# Patient Record
Sex: Female | Born: 1990 | Race: Asian | Hispanic: No | Marital: Single | Smoking: Never smoker
Health system: Southern US, Community
[De-identification: ages and names within clinical notes are randomized; demographics above are authoritative.]

## PROBLEM LIST (undated history)

## (undated) ENCOUNTER — Inpatient Hospital Stay (HOSPITAL_COMMUNITY): Payer: Self-pay

## (undated) DIAGNOSIS — O139 Gestational [pregnancy-induced] hypertension without significant proteinuria, unspecified trimester: Secondary | ICD-10-CM

## (undated) DIAGNOSIS — I499 Cardiac arrhythmia, unspecified: Secondary | ICD-10-CM

## (undated) DIAGNOSIS — R519 Headache, unspecified: Secondary | ICD-10-CM

## (undated) DIAGNOSIS — D649 Anemia, unspecified: Secondary | ICD-10-CM

## (undated) DIAGNOSIS — J45909 Unspecified asthma, uncomplicated: Secondary | ICD-10-CM

## (undated) DIAGNOSIS — Z452 Encounter for adjustment and management of vascular access device: Secondary | ICD-10-CM

## (undated) DIAGNOSIS — T451X5A Adverse effect of antineoplastic and immunosuppressive drugs, initial encounter: Secondary | ICD-10-CM

## (undated) DIAGNOSIS — N12 Tubulo-interstitial nephritis, not specified as acute or chronic: Secondary | ICD-10-CM

## (undated) DIAGNOSIS — O24419 Gestational diabetes mellitus in pregnancy, unspecified control: Secondary | ICD-10-CM

## (undated) DIAGNOSIS — N390 Urinary tract infection, site not specified: Secondary | ICD-10-CM

## (undated) DIAGNOSIS — O019 Hydatidiform mole, unspecified: Secondary | ICD-10-CM

## (undated) DIAGNOSIS — N856 Intrauterine synechiae: Secondary | ICD-10-CM

## (undated) DIAGNOSIS — D701 Agranulocytosis secondary to cancer chemotherapy: Secondary | ICD-10-CM

## (undated) HISTORY — DX: Encounter for adjustment and management of vascular access device: Z45.2

## (undated) HISTORY — DX: Adverse effect of antineoplastic and immunosuppressive drugs, initial encounter: T45.1X5A

## (undated) HISTORY — DX: Intrauterine synechiae: N85.6

## (undated) HISTORY — DX: Cardiac arrhythmia, unspecified: I49.9

## (undated) HISTORY — DX: Adverse effect of antineoplastic and immunosuppressive drugs, initial encounter: D70.1

## (undated) HISTORY — DX: Hydatidiform mole, unspecified: O01.9

## (undated) HISTORY — DX: Tubulo-interstitial nephritis, not specified as acute or chronic: N12

---

## 2012-05-29 ENCOUNTER — Encounter (HOSPITAL_COMMUNITY): Payer: Self-pay

## 2012-05-29 ENCOUNTER — Emergency Department (INDEPENDENT_AMBULATORY_CARE_PROVIDER_SITE_OTHER)
Admission: EM | Admit: 2012-05-29 | Discharge: 2012-05-29 | Disposition: A | Discharge status: Home / Self Care | Payer: Self-pay | Source: Home / Self Care | Attending: Family Medicine | Admitting: Family Medicine

## 2012-05-29 DIAGNOSIS — J029 Acute pharyngitis, unspecified: Secondary | ICD-10-CM

## 2012-05-29 DIAGNOSIS — R1031 Right lower quadrant pain: Secondary | ICD-10-CM

## 2012-05-29 LAB — POCT URINALYSIS DIP (DEVICE)
Bilirubin Urine: NEGATIVE
Hgb urine dipstick: NEGATIVE
Ketones, ur: NEGATIVE mg/dL
Leukocytes, UA: NEGATIVE
Specific Gravity, Urine: 1.01 (ref 1.005–1.030)
pH: 7 (ref 5.0–8.0)

## 2012-05-29 LAB — WET PREP, GENITAL
Trich, Wet Prep: NONE SEEN
Yeast Wet Prep HPF POC: NONE SEEN

## 2012-05-29 MED ORDER — NAPROXEN 500 MG PO TABS: 500.0000 mg | ORAL_TABLET | Freq: Two times a day (BID) | ORAL | Status: DC

## 2012-05-29 MED ORDER — ACETAMINOPHEN-CODEINE #3 300-30 MG PO TABS: 1.0000 | ORAL_TABLET | Freq: Four times a day (QID) | ORAL | Status: DC | PRN

## 2012-05-29 MED ORDER — AMOXICILLIN 500 MG PO CAPS: 500.0000 mg | ORAL_CAPSULE | Freq: Three times a day (TID) | ORAL | Status: DC

## 2012-05-29 MED ORDER — PROMETHAZINE HCL 25 MG PO TABS: 25.0000 mg | ORAL_TABLET | Freq: Four times a day (QID) | ORAL | Status: DC | PRN

## 2012-05-29 NOTE — ED Notes (Signed)
Reportedly has been having lower abdominal pain x 1 week , worst RLQ, HA

## 2012-05-31 NOTE — ED Provider Notes (Signed)
History     CSN: [id number]  Arrival date & time 05/29/12  1241   First MD Initiated Contact with Patient 05/29/12 1407      Chief Complaint  Patient presents with  . Abdominal Pain    (Consider location/radiation/quality/duration/timing/severity/associated sxs/prior treatment) HPI Comments: 21 year old female from [place] here with vague complains including headache, sore throat, pain with swallowing, nausea and abdominal pain for one week. Patient reports subjective fever. States she has taken Advil without relief. No rashes. Reports her abdominal pain is mostly on the lower abdomen mostly on the right side. Denies vaginal discharge. Last bowel movement this morning soft brown. Denies constipation or diarrhea. She is sexually active and does not use protection. She is on Depo-Provera for birth control. Denies prior history of sexually transmitted diseases.   History reviewed. No pertinent past medical history.  History reviewed. No pertinent past surgical history.  History reviewed. No pertinent family history.  History  Substance Use Topics  . Smoking status: Not on file  . Smokeless tobacco: Not on file  . Alcohol Use:     OB History    Grav Para Term Preterm Abortions TAB SAB Ect Mult Living                  Review of Systems  Constitutional: Positive for fever. Negative for chills, appetite change and fatigue.  HENT: Positive for sore throat and trouble swallowing. Negative for ear pain, congestion and rhinorrhea.   Respiratory: Negative for cough, shortness of breath and wheezing.   Gastrointestinal: Positive for nausea and abdominal pain. Negative for vomiting.  Genitourinary: Negative for dysuria, vaginal bleeding and vaginal discharge.  Musculoskeletal: Negative for myalgias and arthralgias.  Skin: Negative for rash.  Neurological: Positive for headaches. Negative for dizziness.  All other systems reviewed and are negative.    Allergies  Review of patient's  allergies indicates no known allergies.  Home Medications   Current Outpatient Rx  Name  Route  Sig  Dispense  Refill  . ACETAMINOPHEN-CODEINE #3 300-30 MG PO TABS   Oral   Take 1 tablet by mouth every 6 (six) hours as needed for pain.   20 tablet   0   . AMOXICILLIN 500 MG PO CAPS   Oral   Take 1 capsule (500 mg total) by mouth 3 (three) times daily.   21 capsule   0   . NAPROXEN 500 MG PO TABS   Oral   Take 1 tablet (500 mg total) by mouth 2 (two) times daily with a meal.   20 tablet   0   . PROMETHAZINE HCL 25 MG PO TABS   Oral   Take 1 tablet (25 mg total) by mouth every 6 (six) hours as needed for nausea.   15 tablet   0     BP 120/92  Pulse 83  Temp 98.8 F (37.1 C) (Oral)  Resp 16  SpO2 100%  Physical Exam  Vitals reviewed. Constitutional: She appears well-developed and well-nourished. No distress.  HENT:  Head: Normocephalic and atraumatic.       Nose normal. Significant pharyngeal erythema, white exudates. No uvula deviation. No trismus. TM's with increased vascular markings and some dullness bilaterally no swelling or bulging   Eyes: Conjunctivae normal and EOM are normal. Pupils are equal, round, and reactive to light. Right eye exhibits no discharge. Left eye exhibits no discharge. No scleral icterus.  Cardiovascular: Normal rate, regular rhythm and normal heart sounds.   No murmur heard.  Pulmonary/Chest: Effort normal and breath sounds normal. No respiratory distress. She has no wheezes. She has no rales. She exhibits no tenderness.  Abdominal: Soft. Bowel sounds are normal. She exhibits no distension and no mass. There is no tenderness. There is no rebound and no guarding.  Genitourinary: Uterus normal. There is no rash or lesion on the right labia. There is no rash or lesion on the left labia. Uterus is not enlarged and not tender. Cervix exhibits no motion tenderness and no discharge. Right adnexum displays no mass, no tenderness and no fullness.  Left adnexum displays no mass, no tenderness and no fullness. No erythema around the vagina. Vaginal discharge found.  Lymphadenopathy:       Right: No inguinal adenopathy present.       Left: No inguinal adenopathy present.  Skin: She is not diaphoretic.    ED Course  Procedures (including critical care time)  Labs Reviewed  WET PREP, GENITAL - Abnormal; Notable for the following:    Clue Cells Wet Prep HPF POC FEW (*)     WBC, Wet Prep HPF POC FEW (*)     All other components within normal limits  POCT PREGNANCY, URINE  POCT URINALYSIS DIP (DEVICE)  GC/CHLAMYDIA PROBE AMP   No results found.   1. Pharyngitis   2. Right lower quadrant pain       MDM  Negative pregnancy test. Pharyngitis treated with amoxicillin.  Reassuring abdominal exam with no findings suggestive of acute abdomen, clinically well. Abdominal pain treated with naproxen and Tylenol No. 3. Prescribed Phenergan for nausea to take as as needed. GC and Chlamydia pending at the time of discharge. Supportive care and red flags that should prompt her return to medical attention discussed with patient and provided in writing.        Sharin Grave, MD 05/31/12 [phone]

## 2012-06-01 NOTE — ED Notes (Addendum)
AS of 06-01-2012, GC report negative Chlamydia report negative Wet prep shows  Clue few WBC few   no further treatment required

## 2012-09-30 ENCOUNTER — Other Ambulatory Visit (HOSPITAL_COMMUNITY)
Admission: RE | Admit: 2012-09-30 | Discharge: 2012-09-30 | Disposition: A | Discharge status: Home / Self Care | Payer: Medicaid Other | Source: Ambulatory Visit | Attending: Family Medicine | Admitting: Family Medicine

## 2012-09-30 ENCOUNTER — Encounter (HOSPITAL_COMMUNITY): Payer: Self-pay | Admitting: *Deleted

## 2012-09-30 ENCOUNTER — Emergency Department (INDEPENDENT_AMBULATORY_CARE_PROVIDER_SITE_OTHER)
Admission: EM | Admit: 2012-09-30 | Discharge: 2012-09-30 | Disposition: A | Discharge status: Home / Self Care | Payer: Medicaid Other | Source: Home / Self Care

## 2012-09-30 DIAGNOSIS — R300 Dysuria: Secondary | ICD-10-CM

## 2012-09-30 DIAGNOSIS — Z113 Encounter for screening for infections with a predominantly sexual mode of transmission: Secondary | ICD-10-CM | POA: Insufficient documentation

## 2012-09-30 DIAGNOSIS — N760 Acute vaginitis: Secondary | ICD-10-CM | POA: Insufficient documentation

## 2012-09-30 DIAGNOSIS — R1110 Vomiting, unspecified: Secondary | ICD-10-CM

## 2012-09-30 DIAGNOSIS — N72 Inflammatory disease of cervix uteri: Secondary | ICD-10-CM

## 2012-09-30 DIAGNOSIS — R102 Pelvic and perineal pain: Secondary | ICD-10-CM

## 2012-09-30 DIAGNOSIS — N949 Unspecified condition associated with female genital organs and menstrual cycle: Secondary | ICD-10-CM

## 2012-09-30 LAB — CBC WITH DIFFERENTIAL/PLATELET
Basophils Absolute: 0 10*3/uL (ref 0.0–0.1)
Eosinophils Absolute: 0.1 10*3/uL (ref 0.0–0.7)
Eosinophils Relative: 1 % (ref 0–5)
Lymphocytes Relative: 45 % (ref 12–46)
MCH: 31.7 pg (ref 26.0–34.0)
MCV: 92.1 fL (ref 78.0–100.0)
Platelets: 206 10*3/uL (ref 150–400)
RDW: 11.8 % (ref 11.5–15.5)
WBC: 10.2 10*3/uL (ref 4.0–10.5)

## 2012-09-30 LAB — POCT URINALYSIS DIP (DEVICE)
Glucose, UA: NEGATIVE mg/dL
Nitrite: NEGATIVE
Urobilinogen, UA: 0.2 mg/dL (ref 0.0–1.0)

## 2012-09-30 LAB — POCT PREGNANCY, URINE: Preg Test, Ur: NEGATIVE

## 2012-09-30 MED ORDER — ONDANSETRON 4 MG PO TBDP
ORAL_TABLET | ORAL | Status: AC
  Filled 2012-09-30: qty 1

## 2012-09-30 MED ORDER — CEFTRIAXONE SODIUM 250 MG IJ SOLR
250.0000 mg | Freq: Once | INTRAMUSCULAR | Status: AC
  Administered 2012-09-30: 250 mg via INTRAMUSCULAR

## 2012-09-30 MED ORDER — ONDANSETRON HCL 4 MG PO TABS: 4.0000 mg | ORAL_TABLET | Freq: Four times a day (QID) | ORAL | Status: DC

## 2012-09-30 MED ORDER — LIDOCAINE HCL (PF) 1 % IJ SOLN
INTRAMUSCULAR | Status: AC
  Filled 2012-09-30: qty 5

## 2012-09-30 MED ORDER — ONDANSETRON 4 MG PO TBDP
4.0000 mg | ORAL_TABLET | Freq: Once | ORAL | Status: AC
  Administered 2012-09-30: 4 mg via ORAL

## 2012-09-30 MED ORDER — AZITHROMYCIN 250 MG PO TABS
ORAL_TABLET | ORAL | Status: AC
  Filled 2012-09-30: qty 4

## 2012-09-30 MED ORDER — HYDROCODONE-ACETAMINOPHEN 5-325 MG PO TABS: ORAL_TABLET | ORAL | Status: DC

## 2012-09-30 MED ORDER — AZITHROMYCIN 250 MG PO TABS
500.0000 mg | ORAL_TABLET | Freq: Every day | ORAL | Status: DC
  Administered 2012-09-30: 500 mg via ORAL

## 2012-09-30 MED ORDER — CEFTRIAXONE SODIUM 250 MG IJ SOLR
INTRAMUSCULAR | Status: AC
  Filled 2012-09-30: qty 250

## 2012-09-30 NOTE — ED Provider Notes (Signed)
Medical screening examination/treatment/procedure(s) were performed by resident physician or non-physician practitioner and as supervising physician I was immediately available for consultation/collaboration.   KINDL,JAMES DOUGLAS MD.   James D Kindl, MD 09/30/12 2042 

## 2012-09-30 NOTE — ED Notes (Addendum)
C/o low abdominal pain onset 2 weeks ago. Pain is worse at night and can't sleep.  No fever.  Vomited every day for 1 week.  Vomited 3-4 x yesterday and today.  No diarrhea. No vaginal bleeding.  LMP 08/27/12.  Had neg. pregnacy test yesterday and today.  C/o burning and frequency of urination in small amounts.  No vaginal discharge.  C/o breast tenderness for 3 days.

## 2012-09-30 NOTE — ED Provider Notes (Signed)
History     CSN: [id number]  Arrival date & time 09/30/12  1429   First MD Initiated Contact with Patient 09/30/12 1641      Chief Complaint  Patient presents with  . Abdominal Pain    (Consider location/radiation/quality/duration/timing/severity/associated sxs/prior treatment) HPI Comments: 22 year old female with low, or pelvic pain for nearly a month. He tends to be worse after 7 PM and at nighttime. It is intermittent and sometimes crampy. It is associated with vomiting, headache and low back ache. She has vomited 4 times today. In nearly everyday for the past several days. In addition complains of dysuria and urinary frequency. She states that when she voids her urine stops halfway. Denies vaginal discharge or fever.   History reviewed. No pertinent past medical history.  History reviewed. No pertinent past surgical history.  History reviewed. No pertinent family history.  History  Substance Use Topics  . Smoking status: Never Smoker   . Smokeless tobacco: Not on file  . Alcohol Use: No    OB History   Grav Para Term Preterm Abortions TAB SAB Ect Mult Living                  Review of Systems  Constitutional: Positive for activity change and appetite change. Negative for fever.  HENT: Negative.   Respiratory: Negative.   Cardiovascular: Negative.   Gastrointestinal: Positive for nausea, vomiting and abdominal pain. Negative for diarrhea, constipation, blood in stool and abdominal distention.  Genitourinary: Positive for dysuria, urgency, frequency, decreased urine volume and pelvic pain. Negative for hematuria, vaginal bleeding, vaginal discharge and vaginal pain.  Musculoskeletal: Negative.   Skin: Negative.   Neurological: Negative.   Psychiatric/Behavioral: Negative.     Allergies  Review of patient's allergies indicates no known allergies.  Home Medications   Current Outpatient Rx  Name  Route  Sig  Dispense  Refill  . acetaminophen-codeine (TYLENOL  #3) 300-30 MG per tablet   Oral   Take 1 tablet by mouth every 6 (six) hours as needed for pain.   20 tablet   0   . amoxicillin (AMOXIL) 500 MG capsule   Oral   Take 1 capsule (500 mg total) by mouth 3 (three) times daily.   21 capsule   0   . naproxen (NAPROSYN) 500 MG tablet   Oral   Take 1 tablet (500 mg total) by mouth 2 (two) times daily with a meal.   20 tablet   0   . ondansetron (ZOFRAN) 4 MG tablet   Oral   Take 1 tablet (4 mg total) by mouth every 6 (six) hours.   15 tablet   0   . promethazine (PHENERGAN) 25 MG tablet   Oral   Take 1 tablet (25 mg total) by mouth every 6 (six) hours as needed for nausea.   15 tablet   0     BP 123/82  Pulse 81  Temp(Src) 98.6 F (37 C) (Oral)  Resp 16  SpO2 100%  LMP 08/27/2012  Physical Exam  Nursing note and vitals reviewed. Constitutional: She is oriented to person, place, and time. She appears well-developed and well-nourished. No distress.  HENT:  Head: Normocephalic and atraumatic.  Mouth/Throat: No oropharyngeal exudate.  Eyes: EOM are normal. Pupils are equal, round, and reactive to light.  Neck: Normal range of motion. Neck supple.  Cardiovascular: Normal rate and normal heart sounds.   Pulmonary/Chest: Effort normal and breath sounds normal. No respiratory distress. She has no wheezes.  She has no rales.  Abdominal: Soft. There is no tenderness. There is no rebound and no guarding.  Genitourinary:  Tenderness across pelvis Normal external female genitalia. The cervix is just left of the midline. The ectocervix has mild erythema and there is a brown thick discharge in the os and vaginal vault. The  volume is small. Q-tip  angulation of cervix produces moderate to severe pain.  swabs were obtained for testing  Bimanual: Moderate pain with CMT, right and left adnexal tenderness. No masses were palpated.   Musculoskeletal: Normal range of motion.  Neurological: She is alert and oriented to person, place, and  time. No cranial nerve deficit.  Skin: Skin is warm and dry.  Psychiatric: She has a normal mood and affect.    ED Course  Procedures (including critical care time)  Labs Reviewed  CBC WITH DIFFERENTIAL - Abnormal; Notable for the following:    Lymphs Abs 4.6 (*)    All other components within normal limits  POCT URINALYSIS DIP (DEVICE) - Abnormal; Notable for the following:    Hgb urine dipstick SMALL (*)    All other components within normal limits  URINE CULTURE  POCT PREGNANCY, URINE  CERVICOVAGINAL ANCILLARY ONLY   No results found.  Results for orders placed during the hospital encounter of 09/30/12  CBC WITH DIFFERENTIAL      Result Value Range   WBC 10.2  4.0 - 10.5 K/uL   RBC 4.17  3.87 - 5.11 MIL/uL   Hemoglobin 13.2  12.0 - 15.0 g/dL   HCT 45.4  09.8 - 11.9 %   MCV 92.1  78.0 - 100.0 fL   MCH 31.7  26.0 - 34.0 pg   MCHC 34.4  30.0 - 36.0 g/dL   RDW 14.7  82.9 - 56.2 %   Platelets 206  150 - 400 K/uL   Neutrophils Relative 46  43 - 77 %   Neutro Abs 4.7  1.7 - 7.7 K/uL   Lymphocytes Relative 45  12 - 46 %   Lymphs Abs 4.6 (*) 0.7 - 4.0 K/uL   Monocytes Relative 8  3 - 12 %   Monocytes Absolute 0.8  0.1 - 1.0 K/uL   Eosinophils Relative 1  0 - 5 %   Eosinophils Absolute 0.1  0.0 - 0.7 K/uL   Basophils Relative 0  0 - 1 %   Basophils Absolute 0.0  0.0 - 0.1 K/uL  POCT URINALYSIS DIP (DEVICE)      Result Value Range   Glucose, UA NEGATIVE  NEGATIVE mg/dL   Bilirubin Urine NEGATIVE  NEGATIVE   Ketones, ur NEGATIVE  NEGATIVE mg/dL   Specific Gravity, Urine 1.015  1.005 - 1.030   Hgb urine dipstick SMALL (*) NEGATIVE   pH 7.5  5.0 - 8.0   Protein, ur NEGATIVE  NEGATIVE mg/dL   Urobilinogen, UA 0.2  0.0 - 1.0 mg/dL   Nitrite NEGATIVE  NEGATIVE   Leukocytes, UA NEGATIVE  NEGATIVE  POCT PREGNANCY, URINE      Result Value Range   Preg Test, Ur NEGATIVE  NEGATIVE      1. Pelvic pain in female   2. Cervicitis   3. Dysuria   4. Vomiting       MDM   The patient does not have an acute abdomen or signs of peritoneal irritation. The primary finding is that of cervicitis/CMT. In office with administer. Zofran 4 mg by mouth, Rocephin 250 mg IM and 500 mg of azithromycin.  This    dose is based on her small body habitus and weight. Norco 5 mg one half tablet every 4 hours when necessary pain #10   Prescription for Zofran 4 mg by mouth every 6 hours when necessary nausea and vomiting. Encouraged to drink clear liquids. Send urine for culture The lab work urinalysis and pregnancy test were all within normal limits or negative. for any worsening, new symptoms problems, fever, unable to hold  medications down or worsening... return.   Hayden Rasmussen, NP 09/30/12 1834  Hayden Rasmussen, NP 09/30/12 [id number]

## 2012-10-01 LAB — URINE CULTURE

## 2012-10-23 ENCOUNTER — Emergency Department (HOSPITAL_COMMUNITY): Payer: Medicaid Other

## 2012-10-23 ENCOUNTER — Encounter (HOSPITAL_COMMUNITY): Payer: Self-pay | Admitting: *Deleted

## 2012-10-23 ENCOUNTER — Emergency Department (HOSPITAL_COMMUNITY)
Admission: EM | Admit: 2012-10-23 | Discharge: 2012-10-24 | Disposition: A | Discharge status: Home / Self Care | Payer: Medicaid Other | Attending: Emergency Medicine | Admitting: Emergency Medicine

## 2012-10-23 ENCOUNTER — Ambulatory Visit: Payer: Self-pay | Admitting: Family Medicine

## 2012-10-23 DIAGNOSIS — R42 Dizziness and giddiness: Secondary | ICD-10-CM | POA: Insufficient documentation

## 2012-10-23 DIAGNOSIS — R509 Fever, unspecified: Secondary | ICD-10-CM | POA: Insufficient documentation

## 2012-10-23 DIAGNOSIS — R1110 Vomiting, unspecified: Secondary | ICD-10-CM | POA: Insufficient documentation

## 2012-10-23 DIAGNOSIS — Z3202 Encounter for pregnancy test, result negative: Secondary | ICD-10-CM | POA: Insufficient documentation

## 2012-10-23 DIAGNOSIS — A419 Sepsis, unspecified organism: Secondary | ICD-10-CM

## 2012-10-23 DIAGNOSIS — N390 Urinary tract infection, site not specified: Secondary | ICD-10-CM | POA: Insufficient documentation

## 2012-10-23 LAB — CBC WITH DIFFERENTIAL/PLATELET
Basophils Absolute: 0 10*3/uL (ref 0.0–0.1)
Eosinophils Relative: 0 % (ref 0–5)
Lymphocytes Relative: 18 % (ref 12–46)
Lymphs Abs: 2.9 10*3/uL (ref 0.7–4.0)
MCV: 89.8 fL (ref 78.0–100.0)
Neutro Abs: 11.3 10*3/uL — ABNORMAL HIGH (ref 1.7–7.7)
Neutrophils Relative %: 69 % (ref 43–77)
Platelets: 204 10*3/uL (ref 150–400)
RBC: 4.03 MIL/uL (ref 3.87–5.11)
RDW: 11.8 % (ref 11.5–15.5)
WBC: 16.3 10*3/uL — ABNORMAL HIGH (ref 4.0–10.5)

## 2012-10-23 LAB — COMPREHENSIVE METABOLIC PANEL
ALT: 16 U/L (ref 0–35)
AST: 20 U/L (ref 0–37)
Alkaline Phosphatase: 58 U/L (ref 39–117)
CO2: 26 mEq/L (ref 19–32)
Calcium: 9.6 mg/dL (ref 8.4–10.5)
GFR calc Af Amer: >90 mL/min (ref >90)
GFR calc non Af Amer: >90 mL/min (ref >90)
Glucose, Bld: 101 mg/dL — ABNORMAL HIGH (ref 70–99)
Potassium: 3.3 mEq/L — ABNORMAL LOW (ref 3.5–5.1)
Sodium: 134 mEq/L — ABNORMAL LOW (ref 135–145)

## 2012-10-23 LAB — URINALYSIS, MICROSCOPIC ONLY
Bilirubin Urine: NEGATIVE
Glucose, UA: NEGATIVE mg/dL
Hgb urine dipstick: NEGATIVE
Protein, ur: NEGATIVE mg/dL
Specific Gravity, Urine: 1.004 — ABNORMAL LOW (ref 1.005–1.030)

## 2012-10-23 LAB — OCCULT BLOOD, POC DEVICE: Fecal Occult Bld: NEGATIVE

## 2012-10-23 LAB — POCT PREGNANCY, URINE: Preg Test, Ur: NEGATIVE

## 2012-10-23 MED ORDER — IOHEXOL 300 MG/ML  SOLN
80.0000 mL | Freq: Once | INTRAMUSCULAR | Status: AC | PRN
  Administered 2012-10-23: 80 mL via INTRAVENOUS

## 2012-10-23 MED ORDER — IOHEXOL 300 MG/ML  SOLN
50.0000 mL | Freq: Once | INTRAMUSCULAR | Status: AC | PRN
  Administered 2012-10-23: 50 mL via ORAL

## 2012-10-23 NOTE — ED Notes (Signed)
Pt with LLQ pain, emesis, headache and dizziness x 2 weeks.  C/o burning with urination and L flank pain.  Denies vag discharge or changes in bowel habits.  LMP 08/27/12, pt on depo shot.

## 2012-10-23 NOTE — ED Notes (Signed)
Pt transported to CT ?

## 2012-10-23 NOTE — ED Provider Notes (Signed)
History     CSN: [id number]  Arrival date & time 10/23/12  1646   First MD Initiated Contact with Patient 10/23/12 1913      Chief Complaint  Patient presents with  . Abdominal Pain    (Consider location/radiation/quality/duration/timing/severity/associated sxs/prior treatment) HPI Comments: Patient is a 22 y/o F presenting with LLQ x 2 weeks. Patient described pain as a sharp, shooting, throbbing pain that is intermittent, lasting a couple of minutes, but has increased in intensity over the past week, without radiation. Patient reported that she has been using Ibuprofen with minimal relief. Patient reported that is it hard to sleep because she keeps getting up in the middle of the night due to the pain. Patient reported one episode of emesis this morning, mainly of food contents. Associated symptoms are subjective fever and dizziness. Patient denied change to appetite, dysphagia, shortness of breathe, difficulty breathing, chest pain, diarrhea, melena, hematochezia, vaginal bleeding, vaginal discharge, changes to bowel habits, urinary symptoms, leg pain.  Patient was recently seen in the ED for cervicitis and treated while in ED.   Patient reported LMP being 08/27/2012 - stated that she is currently on Depo-Provera shots, last shot given on 08/19/2012. Patient reported being sexually active, but uses protection with partner.   The history is provided by the patient. No language interpreter was used.    History reviewed. No pertinent past medical history.  History reviewed. No pertinent past surgical history.  No family history on file.  History  Substance Use Topics  . Smoking status: Never Smoker   . Smokeless tobacco: Not on file  . Alcohol Use: No    OB History   Grav Para Term Preterm Abortions TAB SAB Ect Mult Living                  Review of Systems  Constitutional: Positive for fever. Negative for chills.  HENT: Negative for ear pain, sore throat, trouble swallowing  and neck pain.   Respiratory: Negative for chest tightness and shortness of breath.   Cardiovascular: Negative for chest pain.  Gastrointestinal: Positive for vomiting and abdominal pain. Negative for nausea and diarrhea.       LLQ  Genitourinary: Negative for dysuria, hematuria, flank pain, decreased urine volume, vaginal bleeding, vaginal discharge, difficulty urinating, vaginal pain and pelvic pain.  Neurological: Positive for dizziness. Negative for numbness and headaches.  All other systems reviewed and are negative.    Allergies  Ondansetron and Promethazine  Home Medications   Current Outpatient Rx  Name  Route  Sig  Dispense  Refill  . ibuprofen (ADVIL,MOTRIN) 200 MG tablet   Oral   Take 200 mg by mouth every 6 (six) hours as needed for pain.           BP 118/82  Pulse 128  Temp(Src) 97.7 F (36.5 C) (Oral)  Resp 18  SpO2 99%  LMP 08/27/2012  Physical Exam  Nursing note and vitals reviewed. Constitutional: She is oriented to person, place, and time. She appears well-developed and well-nourished. No distress.  HENT:  Head: Normocephalic and atraumatic.  Eyes: Conjunctivae and EOM are normal. Pupils are equal, round, and reactive to light. Right eye exhibits no discharge. Left eye exhibits no discharge.  Neck: Normal range of motion. Neck supple. No tracheal deviation present.  Cardiovascular: Normal rate, regular rhythm, normal heart sounds and intact distal pulses.  Exam reveals no friction rub.   No murmur heard. Pulmonary/Chest: Effort normal and breath sounds normal. No respiratory  distress. She has no wheezes. She has no rales.  Abdominal: Soft. Bowel sounds are normal. She exhibits no distension. There is tenderness. There is no rebound and no guarding.  Tenderness upon deep palpation to LLQ  Genitourinary:  Rectal Exam: No masses, lesions, sores noted to external region. No masses or lesions noted to rectum. Good sphincter tone. No pain during exam.  Negative blood.   Performed with chaperone  Lymphadenopathy:    She has no cervical adenopathy.  Neurological: She is alert and oriented to person, place, and time. No cranial nerve deficit. She exhibits normal muscle tone. Coordination normal.  Skin: Skin is warm and dry. No rash noted. She is not diaphoretic. No erythema.  Psychiatric: She has a normal mood and affect. Her behavior is normal. Thought content normal.    ED Course  Procedures (including critical care time)  Labs Reviewed  CBC WITH DIFFERENTIAL - Abnormal; Notable for the following:    WBC 16.3 (*)    Neutro Abs 11.3 (*)    Monocytes Relative 13 (*)    Monocytes Absolute 2.1 (*)    All other components within normal limits  COMPREHENSIVE METABOLIC PANEL - Abnormal; Notable for the following:    Sodium 134 (*)    Potassium 3.3 (*)    Glucose, Bld 101 (*)    BUN 5 (*)    All other components within normal limits  URINALYSIS, MICROSCOPIC ONLY - Abnormal; Notable for the following:    Color, Urine STRAW (*)    Specific Gravity, Urine 1.004 (*)    Leukocytes, UA TRACE (*)    All other components within normal limits  LIPASE, BLOOD  POCT PREGNANCY, URINE  OCCULT BLOOD, POC DEVICE   No results found.   No diagnosis found.    MDM  Patient is a 22 y/o F presenting to the ED with LLQ tenderness x 2 weeks that has gotten progressively worse - described as a sharp, throbbing sensation - without radiation. Patient reported Ibuprofen gives minimal relief. Associated symptoms are subjective fever and dizziness. Denied dysuria, urinary symptoms, vaginal pain, vaginal bleeding, vaginal discharge, pelvic pain, diarrhea, melena, hematochezia, chills, chest pain, shortness of breathe.  Patient was treated for cervicitis on 09/30/2012 - GC/Chlamydia probe negative from 09/30/2012.  I personally evaluated and examined the patient Discussed case with Dr. Effie Shy  Patient refused pain medication when offered.   Fecal Occult  blood negative. UA trace of leukocytes - negative nitrites, ketones, protein, bacteria, Hgb, bilirubin CMP low sodium (134) and potassium (3.3) Lipase within normal limits (23) CBC elevated WBC (16.3) Urine pregnancy negative CT Abdomen and Pelvic with contrast ordered  Patient afebrile, normotensive, non-tachycardic, alert, and oriented. Patient aseptic, non-toxic appearing. No sign of acute abdomen or peritoneal irritation. Negative urine pregnancy - r/o ectopic. Discussed case with Kyung Bacca, PA-C. Transfer of care to [employer], PA-C.           Raymon Mutton, PA-C 10/23/12 2334  Raymon Mutton, PA-C 10/24/12 1610

## 2012-10-23 NOTE — ED Notes (Addendum)
Pt reports having lower left abd pain x 2wks. Pt has been seen in urgent care and they could not find a cause for her pain but she states it has gotten unbearable. Pt rates pain 8/10. Pt also states she has some nausea, vomiting and headache with the pain. Pt also tachycardiac.

## 2012-10-24 MED ORDER — ACETAMINOPHEN 500 MG PO TABS
1000.0000 mg | ORAL_TABLET | Freq: Once | ORAL | Status: AC
  Administered 2012-10-24: 1000 mg via ORAL
  Filled 2012-10-24: qty 2

## 2012-10-24 MED ORDER — SODIUM CHLORIDE 0.9 % IV BOLUS (SEPSIS)
1000.0000 mL | Freq: Once | INTRAVENOUS | Status: AC
  Administered 2012-10-24: 1000 mL via INTRAVENOUS

## 2012-10-24 MED ORDER — METOCLOPRAMIDE HCL 10 MG PO TABS: 10.0000 mg | ORAL_TABLET | Freq: Four times a day (QID) | ORAL | Status: DC

## 2012-10-24 MED ORDER — CEPHALEXIN 500 MG PO CAPS: 1000.0000 mg | ORAL_CAPSULE | Freq: Two times a day (BID) | ORAL | Status: DC

## 2012-10-24 NOTE — ED Notes (Signed)
Pt ambulated to bathroom 

## 2012-10-24 NOTE — ED Notes (Signed)
Pt to be discharged AMA.

## 2012-10-24 NOTE — ED Provider Notes (Signed)
Medical screening examination/treatment/procedure(s) were performed by non-physician practitioner and as supervising physician I was immediately available for consultation/collaboration.  Flint Melter, MD 10/24/12 1910

## 2012-10-24 NOTE — ED Notes (Signed)
Pt wants to go home, notified ED PA Katie, she is going to speak with pt.

## 2012-10-26 NOTE — ED Provider Notes (Signed)
Pt received from [place], PA-C.  Healthy 21yo F presented to ED w/ abd pain, emesis and dysuria.  Urine microscopic nml but CT abd/pelvis consistent w/ L pyelonephritis.  Consulted Dr. Manson Passey, Radiology, and there is no other explanation for her findings.  Results discussed w/ pt.  Plan was to send home w/ abx, but at time of discharge, HR increased to 130.  A fluid bolus initiated.  Temp then increased to 99.9, HR jumped into 150s and RR into upper 30s.  I tried to convince patient to be admitted for what appeared to be urosepsis, but she refused.  Both her and her boyfriend were made aware of possible consequences of leaving, including worsening symptoms and death.  Prescribed keflex and Return precautions discussed.   Shelly Sabina Savaughn Karwowski, PA-C 10/26/12 1031

## 2012-10-28 NOTE — ED Provider Notes (Signed)
Medical screening examination/treatment/procedure(s) were performed by non-physician practitioner and as supervising physician I was immediately available for consultation/collaboration.  Avonlea Sima L Simrah Chatham, MD 10/28/12 1542 

## 2012-10-30 ENCOUNTER — Ambulatory Visit: Payer: Self-pay | Admitting: Medical

## 2012-10-30 ENCOUNTER — Encounter: Payer: Self-pay | Admitting: Medical

## 2012-10-30 VITALS — BP 110/78 | HR 72 | Temp 98.3°F | Resp 16 | Wt 90.0 lb

## 2012-10-30 DIAGNOSIS — N12 Tubulo-interstitial nephritis, not specified as acute or chronic: Secondary | ICD-10-CM

## 2012-10-30 DIAGNOSIS — R110 Nausea: Secondary | ICD-10-CM

## 2012-10-30 LAB — POCT URINALYSIS DIPSTICK
Blood, UA: NEGATIVE
Leukocytes, UA: NEGATIVE
Nitrite, UA: NEGATIVE
Urobilinogen, UA: NEGATIVE
pH, UA: 5

## 2012-10-30 MED ORDER — HYDROCODONE-ACETAMINOPHEN 7.5-500 MG PO TABS: 1.0000 | ORAL_TABLET | Freq: Four times a day (QID) | ORAL | Status: DC | PRN

## 2012-10-30 MED ORDER — CIPROFLOXACIN HCL 500 MG PO TABS: 500.0000 mg | ORAL_TABLET | Freq: Two times a day (BID) | ORAL | Status: DC

## 2012-10-30 NOTE — Progress Notes (Signed)
Subjective:  Shelly Koch is a 22 y.o. female who presents as a new patient for hospital f/u.   She is here with interpreter today.   I see her parents as patients.   She went to the hospital about a week ago for worsening few weeks of left lower abdominal pain, nausea, vomiting.  Was diagnosed with pyelonephritis and was advised to be admitted for possible sepsis but she declined to stay.  She did come home on Keflex.  Overall is feeling better, but still has left lower abdominal pain and nausea.   No prior hx/o pyelo or hospitalization for urinary tract infection.  No other aggravating or relieving factors.  No other c/o.  The following portions of the patient's history were reviewed and updated as appropriate: allergies, current medications, past family history, past medical history, past social history, past surgical history and problem list.  ROS Otherwise as in subjective above  Objective: Physical Exam  Vital signs reviewed  General appearance: alert, no distress, WD/WN Neck: supple, no lymphadenopathy, no thyromegaly, no masses Heart: RRR, normal S1, S2, no murmurs Lungs: CTA bilaterally, no wheezes, rhonchi, or rales Abdomen: +bs, soft, mild to moderate LLQ tenderness, non distended, no masses, no hepatomegaly, no splenomegaly Back: no CVA tenderness Pulses: 2+ radial pulses, 2+ pedal pulses, normal cap refill Ext: no edema   Assessment: Encounter Diagnoses  Name Primary?  . Pyelonephritis Yes  . Nausea alone      Plan: Reviewed hospital report, labs, and she does seem to be doing better clinically.  Urine culture sent.   Finish the keflex but begin Cipro, increase water intake, can use Lortab for pain prn. Can c/t Reglan for nausea/vomiting.     Follow up: pending culture

## 2012-11-01 LAB — URINE CULTURE
Colony Count: NO GROWTH
Organism ID, Bacteria: NO GROWTH

## 2012-11-04 ENCOUNTER — Telehealth: Payer: Self-pay | Admitting: Medical

## 2012-11-04 NOTE — Telephone Encounter (Signed)
LM

## 2012-11-10 ENCOUNTER — Encounter: Payer: Self-pay | Admitting: Family Medicine

## 2012-11-13 ENCOUNTER — Telehealth: Payer: Self-pay | Admitting: Family Medicine

## 2012-11-13 ENCOUNTER — Other Ambulatory Visit: Payer: Self-pay | Admitting: Medical

## 2012-11-13 MED ORDER — CIPROFLOXACIN HCL 500 MG PO TABS: 500.0000 mg | ORAL_TABLET | Freq: Two times a day (BID) | ORAL | Status: DC

## 2012-11-13 NOTE — Telephone Encounter (Signed)
This is your patient.

## 2012-11-13 NOTE — Telephone Encounter (Signed)
I sent cipro again.  If not improved by end of this script, recheck

## 2012-11-13 NOTE — Telephone Encounter (Signed)
Patient's boyfriend (Shelly Koch) stopped by, she would like refill on Cipro. She is some better but not completely  Please call Shelly Koch and let him know if Rx called in  (He is on HIPPA)   [facility]

## 2012-11-16 NOTE — Telephone Encounter (Signed)
Patient is aware that another medication was sent to the pharmacy for her. CLS

## 2013-01-29 ENCOUNTER — Ambulatory Visit (INDEPENDENT_AMBULATORY_CARE_PROVIDER_SITE_OTHER): Payer: Medicaid Other | Admitting: Medical

## 2013-01-29 ENCOUNTER — Encounter: Payer: Self-pay | Admitting: Medical

## 2013-01-29 VITALS — BP 108/78 | HR 80 | Temp 97.4°F | Resp 16 | Wt 92.5 lb

## 2013-01-29 DIAGNOSIS — N911 Secondary amenorrhea: Secondary | ICD-10-CM

## 2013-01-29 DIAGNOSIS — N926 Irregular menstruation, unspecified: Secondary | ICD-10-CM

## 2013-01-29 DIAGNOSIS — N912 Amenorrhea, unspecified: Secondary | ICD-10-CM

## 2013-01-29 LAB — POCT URINALYSIS DIPSTICK
Bilirubin, UA: NEGATIVE
Blood, UA: NEGATIVE
Glucose, UA: NEGATIVE
Ketones, UA: NEGATIVE
Spec Grav, UA: <=1.005
pH, UA: 6

## 2013-01-29 MED ORDER — MEDROXYPROGESTERONE ACETATE 5 MG PO TABS: 5.0000 mg | ORAL_TABLET | Freq: Every day | ORAL | Status: DC

## 2013-01-29 NOTE — Progress Notes (Signed)
Subjective: Here for concerns about her period.   She notes that periods have been normal in the past.  Started Depo Provera May 2013 until November 2013.   Ended up having normal periods through about February, then hasn't had a period since.   Has had several negative home pregnancy tests.  [facility] earlier this year in March, had pap, discussed this same problems, was advised watch and wait approach as it takes time for periods and cycle to restore after coming off Depo.   She is in a monogamous relationship with her boyfriend, and they would like to get pregnant.  She is concerned about not having a cycle, wants to make sure if she does get pregnant that she isn't doing anything to harm the baby.   She has questions about her ability to get pregnant.  She did have CT pelvis in 4/14.  Denies urinary or vaginal symptoms, no fatigue, no weight changes, no breast tenderness.  Does get some pelvic cramping about the 6th of each month, but no mensuration.  Past Medical History  Diagnosis Date  . Pyelonephritis    ROS as in subjective  Objective: Filed Vitals:   01/29/13 1114  BP: 108/78  Pulse: 80  Temp: 97.4 F (36.3 C)  Resp: 16    General appearance: alert, no distress, WD/WN Neck: supple, no lymphadenopathy, no thyromegaly, no masses Abdomen: +bs, soft, non tender, non distended, no masses, no hepatomegaly, no splenomegaly Pulses: 2+ symmetric   Assessment: Secondary Amenorrhea  Plan: Discussed symptoms, concerns, the fact that it can take months after stopping Depo Provera for menstrual cycle and ovulation to return to normal.   Will try short course of Provera, but referral to gynecology for recheck and discussion about her symptoms and concerns for pregnancy.  Of note, abdomen/pelvis CT with normal appearing uterus and ovaries 10/2012.  Follow-up with call back in 2wk, referral to gynecology

## 2013-01-29 NOTE — Patient Instructions (Signed)
Begin Provera tablets, 1 tablet daily for 5 days.  If not bleeding or menstrual period by day 5, then continue the tablets for 5 more days.   Please call back and let me know if this helps start the menstrual cycle.  We will go ahead and refer to obstetrician/gynecologist for recheck/evaluation of irregular periods and desire to get pregnant.

## 2013-02-03 ENCOUNTER — Telehealth: Payer: Self-pay | Admitting: Family Medicine

## 2013-02-03 NOTE — Telephone Encounter (Signed)
Patient is aware of her appointment to see Dr. Juliene Pina on March 08, 2013 @ 1030 am. CLS [phone]

## 2013-02-12 ENCOUNTER — Telehealth: Payer: Self-pay | Admitting: Medical

## 2013-02-12 NOTE — Telephone Encounter (Signed)
At this point, f/u with Dr. Juliene Pina for gynecology appt

## 2013-02-15 NOTE — Telephone Encounter (Signed)
Pt informed to follow up with Gyn

## 2013-05-03 ENCOUNTER — Ambulatory Visit (INDEPENDENT_AMBULATORY_CARE_PROVIDER_SITE_OTHER): Payer: Medicaid Other | Admitting: Family Medicine

## 2013-05-03 ENCOUNTER — Encounter: Payer: Self-pay | Admitting: Family Medicine

## 2013-05-03 VITALS — BP 100/70 | HR 72 | Ht <= 58 in | Wt 93.0 lb

## 2013-05-03 DIAGNOSIS — M545 Low back pain, unspecified: Secondary | ICD-10-CM

## 2013-05-03 DIAGNOSIS — R51 Headache: Secondary | ICD-10-CM

## 2013-05-03 DIAGNOSIS — R519 Headache, unspecified: Secondary | ICD-10-CM

## 2013-05-03 DIAGNOSIS — Z3169 Encounter for other general counseling and advice on procreation: Secondary | ICD-10-CM

## 2013-05-03 LAB — POCT URINALYSIS DIPSTICK
Bilirubin, UA: NEGATIVE
Glucose, UA: NEGATIVE
Ketones, UA: NEGATIVE
Spec Grav, UA: <=1.005
Urobilinogen, UA: NEGATIVE

## 2013-05-03 NOTE — Patient Instructions (Signed)
Muscular headache--use aleve or ibuprofen as needed.  Use warm compresses (or hot shower) and do stretches as shown.  Massage of the neck muscles will also likely help.  There is some evidence of allergies on exam, which may contribute to mild vertigo (dizziness). Use clartin as needed for allergies and sinus congestion, and a nasal saline spray as needed (for the nasal discomfort you described).  Start an over-the-counter prenatal vitamin, and take this daily.  Do a pregnancy test in 2 weeks if you haven't gotten your period when expected.

## 2013-05-03 NOTE — Progress Notes (Signed)
Chief Complaint  Patient presents with  . Headache    since yesterday she has had a headache, still has today. She has not slept because of HA. Also had period on 04/16/13, began spotting (light bleeding) on 04/30/13 accompanied by cramping and lbp-bleeding has stopped and cramps have stopped also, still has back pain.    Headache woke her up from sleep last night.  Location is the back of the entire head, bilaterally, not extending to temples or to the front of the head or sinuses. She took acetaminophen this morning, with only slight improvement.  Currently pain is 4-5/10.  She feels a little dizzy.  No nausea.  Feels like things are spinning some (mild).  Doesn't feel faint/presyncopal.  Denies any runny nose, sniffles, fevers.  She has some sinus congestion, dry nose.  Denies any PND.  Denies ear pain, tinnitus, hearing loss.  10/10 (2 weeks after last period started) she had some cramping and light spotting, as well as some low back pain.  This period was after a 10 day course of progesterone from the GYN.  She had stopped the Depo Provera shots back 05/2012.  She is trying for pregnancy. She had a positive ovulation predictor test on 10/10, and did have unprotected sex.  Past Medical History  Diagnosis Date  . Pyelonephritis    History reviewed. No pertinent past surgical history.  History   Social History  . Marital Status: Single    Spouse Name: N/A    Number of Children: N/A  . Years of Education: N/A   Occupational History  . Not on file.   Social History Main Topics  . Smoking status: Never Smoker   . Smokeless tobacco: Never Used  . Alcohol Use: No  . Drug Use: No  . Sexual Activity: Yes    Partners: Male    Birth Control/ Protection: None     Comment: Stopped Depo in November; trying for pregnancy   Other Topics Concern  . Not on file   Social History Narrative  . No narrative on file   Current outpatient prescriptions:acetaminophen (TYLENOL) 325 MG tablet, Take  650 mg by mouth every 6 (six) hours as needed for pain., Disp: , Rfl: ;  ibuprofen (ADVIL,MOTRIN) 200 MG tablet, Take 200 mg by mouth every 6 (six) hours as needed for pain., Disp: , Rfl: ;  medroxyPROGESTERone (PROVERA) 5 MG tablet, Take 1 tablet (5 mg total) by mouth daily., Disp: 10 tablet, Rfl: 0 metoCLOPramide (REGLAN) 10 MG tablet, Take 1 tablet (10 mg total) by mouth every 6 (six) hours., Disp: 20 tablet, Rfl: 0  Allergies  Allergen Reactions  . Ondansetron Hives and Itching  . Promethazine Hives and Itching    ROS:  See HPI.  No fevers, vomiting, diarrhea.  Abdominal pain resolved.  +congestion, slight vertigo, and posterior headache.  Denies dysuria, urgency, frequency, vaginal discharge, rashes, bruising, or other complaints.  PHYSICAL EXAM: BP 100/70  Pulse 72  Ht 4' 9.5" (1.461 m)  Wt 93 lb (42.185 kg)  BMI 19.76 kg/m2  LMP 04/16/2013 Pleasant female, in no distress.  Some slight language barrier HEENT:  PERRL, EOMI, fundi benign.  Nasal mucosa is moderately edematous with clear-white mucus.  Sinuses nontender. OP clear Mildly tender over temporalis muscles bilaterally. Neck: FROM.  No spine tenderness.  Tender over paraspinous muscles bilaterally, no significant spasm. Heart: regular rate and rhythm without murmur Lungs: clear bilaterally Back: no spine or CVA tenderness Abdomen: soft, nontender  Urine dip: normal  ASSESSMENT/PLAN:  Headache  LBP (low back pain) - Plan: POCT Urinalysis Dipstick  General counseling and advice for procreative management  Muscular headache--use aleve or ibuprofen as needed.  Use warm compresses (or hot shower) and do stretches as shown.  Massage of the neck muscles will also likely help.  There is some evidence of allergies on exam, which may contribute to mild vertigo (dizziness). Use clartin as needed for allergies and sinus congestion, and a nasal saline spray as needed (for the nasal discomfort you described).  Her pelvic  discomfort and spotting on 10/10 was likely related to ovulation (given +ovulatio predictor kit, per pt). She is trying for pregnancy.  Encouraged that she start an over-the-counter prenatal vitamin, take daily.  Encouraged to perform home pregnancy test in 2 weeks if she doesn't get her period.  F/u with her GYN for other concerns related to pregnancy/attempts at pregnancy, as she is clearly under her care now (having just been given another course of progesterone).  Reassured re: her headache and dizziness.  Follow up if symptoms persist, worsen.

## 2013-07-05 ENCOUNTER — Encounter: Payer: Self-pay | Admitting: Medical

## 2013-07-05 ENCOUNTER — Ambulatory Visit (INDEPENDENT_AMBULATORY_CARE_PROVIDER_SITE_OTHER): Payer: Medicaid Other | Admitting: Medical

## 2013-07-05 VITALS — BP 100/70 | HR 80 | Temp 98.0°F | Resp 18 | Wt 89.0 lb

## 2013-07-05 DIAGNOSIS — Z309 Encounter for contraceptive management, unspecified: Secondary | ICD-10-CM

## 2013-07-05 DIAGNOSIS — N926 Irregular menstruation, unspecified: Secondary | ICD-10-CM

## 2013-07-05 LAB — POCT URINE PREGNANCY: Preg Test, Ur: NEGATIVE

## 2013-07-05 MED ORDER — MEDROXYPROGESTERONE ACETATE 5 MG PO TABS: 5.0000 mg | ORAL_TABLET | Freq: Every day | ORAL | Status: DC

## 2013-07-05 NOTE — Progress Notes (Signed)
Subjective: Here for c/o irregular periods.  She notes hx/o irregular periods.  Has been on OCPs briefly in the past that helped control her periods.   She notes currently is menstruating, day 4.  She notes breakthrough bleeding in between periods the last few months.   She had been trying to get pregnant, has been seeing Dr. Tildon Husky at [facility], did 2 rounds of Clomid, had recent labs and ultrasound which were reportedly normal.  However, due to change in insurance, and due to limitations with transportation, can't get back in to see Dr. Tildon Husky at this time.  She wants to try a few months of OCPs, then try again to get pregnant after that.  She is in relationship with current boyfriend of 2 years.  They have no concern for STD, she notes STD testing within last several months, pap normal 09/2012, just saw Dr. Tildon Husky in august for same.    Past Medical History  Diagnosis Date  . Pyelonephritis    ROS as in subjective  Objective:  Gen: wd,wn, nad Otherwise not examined  Assessment:  Encounter Diagnoses  Name Primary?  . Irregular periods Yes  . Contraception management     Plan: I discussed her concerns and she is aware that going back on OCPs temporarily will keep her from getting pregnant.  She understands this and wants to pursue OCPs at this time just to regular her periods.  She is taking a prenatal vitamin.  She wants to use OCPs x 64mo.   She was advised of risks and benefits of medication, proper use, other forms of contraception.  Discussed that this doesn't prevent STDs.  There is no concerns for STD at this time.  I reviewed recent labs in chart.  I called Dr. Fredia Beets office to consult on this.  I will await her call.  Urine pregnant negative today.  Addendum: Dr. Fredia Beets nurse called back and advised she begin Provera 5mg  daily for 10 days starting today 07/05/13, repeat again in 30 days if period doesn't seem to regulate, and can do this regimen for 3 months in a row if needed until  periods seem to resume normal.  Pt was contacted and is aware.  F/u with Dr. Tildon Husky in 64mo, sooner prn.

## 2013-07-12 ENCOUNTER — Ambulatory Visit: Payer: Medicaid Other

## 2013-07-22 NOTE — L&D Delivery Note (Signed)
Delivery Note At 8:34 PM a viable and healthy female was delivered via Vaginal, Spontaneous Delivery (Presentation: Right Occiput Anterior).  APGAR: 8, 9; weight 4 lb 10.4 oz (2110 g).   Placenta status: Intact, Pathology.  Cord: 3 vessels with the following complications: None. ~15 cm diameter opaque area  Cord pH: NA  Anesthesia: Local  Episiotomy: None Lacerations: 2nd degree w/ right labial extension Suture Repair: 3.0 vicryl rapide Est. Blood Loss (mL):  266ml  Mom to postpartum.  Baby to Couplet care / Skin to Skin. Placenta to: Pathology Feeding: Bottle Circ: NA Contraception: Carolann Littler, Thersa Salt 06/05/2014, 9:31 PM

## 2013-08-26 ENCOUNTER — Ambulatory Visit (INDEPENDENT_AMBULATORY_CARE_PROVIDER_SITE_OTHER): Payer: BC Managed Care – PPO | Admitting: Family Medicine

## 2013-08-26 ENCOUNTER — Encounter: Payer: Self-pay | Admitting: Family Medicine

## 2013-08-26 VITALS — BP 110/60 | HR 68 | Ht <= 58 in | Wt 90.0 lb

## 2013-08-26 DIAGNOSIS — K6289 Other specified diseases of anus and rectum: Secondary | ICD-10-CM

## 2013-08-26 DIAGNOSIS — Z23 Encounter for immunization: Secondary | ICD-10-CM

## 2013-08-26 DIAGNOSIS — N898 Other specified noninflammatory disorders of vagina: Secondary | ICD-10-CM

## 2013-08-26 DIAGNOSIS — L293 Anogenital pruritus, unspecified: Secondary | ICD-10-CM

## 2013-08-26 MED ORDER — HYDROCORTISONE ACETATE 25 MG RE SUPP: 25.0000 mg | Freq: Two times a day (BID) | RECTAL | Status: DC | PRN

## 2013-08-26 NOTE — Patient Instructions (Signed)
Vaginal itching is likely related to sensitivity to the pads you are using.  Try using a different brand of pads, unscented.  You can try using Vagisil for irritation (or other cream with 1/2-1% hydrocortisone), just as needed for itching.  Consider using tampons to avoid using pads during your period, to prevent this problem from recurring each month.  Your rectal pain is partly from a fissure.  There might also be an internal hemorrhoid contributing to your discomfort.  Use the suppository twice daily until your symptoms have resolved.  It is important to drink plenty of water, have high fiber diet. If stools become hard, use a stool softener daily--this keeps them soft, and narrower to avoid causing pain.  Anal Fissure, Adult An anal fissure is a small tear or crack in the skin around the anus. Bleeding from a fissure usually stops on its own within a few minutes. However, bleeding will often reoccur with each bowel movement until the crack heals.  CAUSES   Passing large, hard stools.  Frequent diarrheal stools.  Constipation.  Inflammatory bowel disease (Crohn's disease or ulcerative colitis).  Infections.  Anal sex. SYMPTOMS   Small amounts of blood seen on your stools, on toilet paper, or in the toilet after a bowel movement.  Rectal bleeding.  Painful bowel movements.  Itching or irritation around the anus. DIAGNOSIS Your caregiver will examine the anal area. An anal fissure can usually be seen with careful inspection. A rectal exam may be performed and a short tube (anoscope) may be used to examine the anal canal. TREATMENT   You may be instructed to take fiber supplements. These supplements can soften your stool to help make bowel movements easier.  Sitz baths may be recommended to help heal the tear. Do not use soap in the sitz baths.  A medicated cream or ointment may be prescribed to lessen discomfort. HOME CARE INSTRUCTIONS   Maintain a diet high in fruits, whole  grains, and vegetables. Avoid constipating foods like bananas and dairy products.  Take sitz baths as directed by your caregiver.  Drink enough fluids to keep your urine clear or pale yellow.  Only take over-the-counter or prescription medicines for pain, discomfort, or fever as directed by your caregiver. Do not take aspirin as this may increase bleeding.  Do not use ointments containing numbing medications (anesthetics) or hydrocortisone. They could slow healing. SEEK MEDICAL CARE IF:   Your fissure is not completely healed within 3 days.  You have further bleeding.  You have a fever.  You have diarrhea mixed with blood.  You have pain.  Your problem is getting worse rather than better. MAKE SURE YOU:   Understand these instructions.  Will watch your condition.  Will get help right away if you are not doing well or get worse. Document Released: 07/08/2005 Document Revised: 09/30/2011 Document Reviewed: 12/23/2010 [facility] Patient Information 2014 [place], [place].

## 2013-08-26 NOTE — Progress Notes (Signed)
Chief Complaint  Patient presents with  . Vaginal Itching    has been experiencing vaginal itching x 7 days and also thinks she may a blister from using sanitary napkin. Itching started when she started her cycle 08/20/13. Also over the past 12 days each time she has a bowel movement she has been experiencing pain in her rectal area that lasts for a little while after the bowel movement.    Vaginal itching intermittently for a couple of years.  Was only mild when had just light bleeding when on the depo provera injections.  Since periods are heavier, and using pads for 5 days, itching is worse.  Denies vaginal discharge.  Only has itching when she uses the pads, even if she changes them frequently.  She hasn't used tampons (doesn't feel like the bleeding is heavy enough).  She is also having pain and burning in rectal area (internally) when passing a bowel movement, starting 10-12 days ago.  Stool was normal, not hard, no straining.  Didn't see any blood in the stool.  She has continued to have a burning discomfort with each bowel movement. Very painful to pass the stool, but stool is soft, not hard. Last bowel movement was this morning.  Stopped Depo provera injections  05/2012.  Periods have been irregular.  She took 3 shots, and had monthly bleeding while on the shots, but after stopping the shots, didn't get a period for 8 months.  She has had 4-5 periods since stopping the injections, last of which started 1/30.   She is [place], and has an interpreter present with her today.  Past Medical History  Diagnosis Date  . Pyelonephritis    History reviewed. No pertinent past surgical history. History   Social History  . Marital Status: Single    Spouse Name: N/A    Number of Children: N/A  . Years of Education: N/A   Occupational History  . Not on file.   Social History Main Topics  . Smoking status: Never Smoker   . Smokeless tobacco: Never Used  . Alcohol Use: No  . Drug Use: No   . Sexual Activity: Yes    Partners: Male    Birth Control/ Protection: None     Comment: Stopped Depo in November; trying for pregnancy   Other Topics Concern  . Not on file   Social History Narrative  . No narrative on file    Outpatient Encounter Prescriptions as of 08/26/2013  Medication Sig  . ibuprofen (ADVIL,MOTRIN) 200 MG tablet Take 200 mg by mouth every 6 (six) hours as needed for pain.  . [DISCONTINUED] medroxyPROGESTERone (PROVERA) 5 MG tablet Take 1 tablet (5 mg total) by mouth daily.  NOT CURRENTLY TAKING ANY MEDICATIONS:  Allergies  Allergen Reactions  . Ondansetron Hives and Itching  . Promethazine Hives and Itching   ROS:  Denies fever, chills, URI symptoms, nausea, vomiting, constipation, diarrhea, blood in stool. No urinary complaints or vaginal discharge.  No chest pain, cough, shortness of breath  PHYSICAL EXAM: BP 110/60  Pulse 68  Ht 4' 9.5" (1.461 m)  Wt 90 lb (40.824 kg)  BMI 19.13 kg/m2 LMP 1/30 Well developed, pleasant, petite female in no distress.  Understand some english. GU: Area of itching is on labia majora, which appears normal, no erythema or inflammation.  Very mild redness at labia minora. Small amount of brown discharge in vaginal vault, otherwise normal GYN.   Rectal--anteriorly there is an area of inflammation, possibly healed fissure.  It is reportedly tender in this area, but more tender internally. Rectal exam--no mass, painful on exam.  No thrombosed hemorrhoids palpable.  Anoscopy not performed--rectal exam caused discomfort.  ASSESSMENT/PLAN:  Vaginal itching  Need for prophylactic vaccination and inoculation against influenza - Plan: Flu Vaccine QUAD 36+ mos IM  Rectal pain - Plan: hydrocortisone (ANUSOL-HC) 25 MG suppository   Itching is likely related to contact dermatitis/irritation from pads.  Consider use of tampons, switch pads.  Use topical meds prn for discomfort (ie vagisil/hydrocortisone).  Suspect anal fissure.   Can't r/o internal hemorrhoid. Trial of anusol HC suppository.

## 2013-10-12 ENCOUNTER — Encounter (HOSPITAL_COMMUNITY): Payer: Self-pay | Admitting: *Deleted

## 2013-10-12 ENCOUNTER — Inpatient Hospital Stay (HOSPITAL_COMMUNITY)
Admission: AD | Admit: 2013-10-12 | Discharge: 2013-10-13 | Disposition: A | Discharge status: Home / Self Care | Payer: BC Managed Care – PPO | Source: Ambulatory Visit | Attending: Family Medicine | Admitting: Family Medicine

## 2013-10-12 ENCOUNTER — Emergency Department (HOSPITAL_COMMUNITY)
Admission: EM | Admit: 2013-10-12 | Discharge: 2013-10-12 | Disposition: A | Discharge status: Nursing Home (Includes ICF) | Payer: BC Managed Care – PPO | Source: Home / Self Care | Attending: Family Medicine | Admitting: Family Medicine

## 2013-10-12 ENCOUNTER — Inpatient Hospital Stay (HOSPITAL_COMMUNITY): Payer: BC Managed Care – PPO

## 2013-10-12 ENCOUNTER — Encounter (HOSPITAL_COMMUNITY): Payer: Self-pay | Admitting: Emergency Medicine

## 2013-10-12 DIAGNOSIS — R109 Unspecified abdominal pain: Secondary | ICD-10-CM

## 2013-10-12 DIAGNOSIS — M545 Low back pain, unspecified: Secondary | ICD-10-CM | POA: Insufficient documentation

## 2013-10-12 DIAGNOSIS — O26899 Other specified pregnancy related conditions, unspecified trimester: Secondary | ICD-10-CM

## 2013-10-12 DIAGNOSIS — O9989 Other specified diseases and conditions complicating pregnancy, childbirth and the puerperium: Secondary | ICD-10-CM

## 2013-10-12 DIAGNOSIS — O99891 Other specified diseases and conditions complicating pregnancy: Secondary | ICD-10-CM | POA: Insufficient documentation

## 2013-10-12 LAB — POCT URINALYSIS DIP (DEVICE)
Bilirubin Urine: NEGATIVE
Glucose, UA: NEGATIVE mg/dL
HGB URINE DIPSTICK: NEGATIVE
Ketones, ur: NEGATIVE mg/dL
Leukocytes, UA: NEGATIVE
NITRITE: NEGATIVE
PH: 6.5 (ref 5.0–8.0)
Protein, ur: NEGATIVE mg/dL
SPECIFIC GRAVITY, URINE: 1.015 (ref 1.005–1.030)
UROBILINOGEN UA: 0.2 mg/dL (ref 0.0–1.0)

## 2013-10-12 LAB — CBC
HCT: 34.9 % — ABNORMAL LOW (ref 36.0–46.0)
Hemoglobin: 11.5 g/dL — ABNORMAL LOW (ref 12.0–15.0)
MCH: 30.6 pg (ref 26.0–34.0)
MCHC: 33 g/dL (ref 30.0–36.0)
MCV: 92.8 fL (ref 78.0–100.0)
PLATELETS: 165 10*3/uL (ref 150–400)
RBC: 3.76 MIL/uL — ABNORMAL LOW (ref 3.87–5.11)
RDW: 12 % (ref 11.5–15.5)
WBC: 8.9 10*3/uL (ref 4.0–10.5)

## 2013-10-12 LAB — HCG, QUANTITATIVE, PREGNANCY: hCG, Beta Chain, Quant, S: 533 m[IU]/mL — ABNORMAL HIGH (ref <5)

## 2013-10-12 LAB — ABO/RH: ABO/RH(D): AB POS

## 2013-10-12 LAB — POCT PREGNANCY, URINE: Preg Test, Ur: POSITIVE — AB

## 2013-10-12 MED ORDER — SODIUM CHLORIDE 0.9 % IV SOLN
Freq: Once | INTRAVENOUS | Status: AC
  Administered 2013-10-12: 21:00:00 via INTRAVENOUS

## 2013-10-12 MED ORDER — ACETAMINOPHEN 500 MG PO TABS
1000.0000 mg | ORAL_TABLET | Freq: Once | ORAL | Status: AC
  Administered 2013-10-12: 1000 mg via ORAL
  Filled 2013-10-12: qty 2

## 2013-10-12 NOTE — ED Provider Notes (Signed)
Medical screening examination/treatment/procedure(s) were performed by resident physician or non-physician practitioner and as supervising physician I was immediately available for consultation/collaboration.   Pauline Good MD.   Billy Fischer, MD 10/12/13 2129

## 2013-10-12 NOTE — ED Notes (Signed)
Report given to paramedics.

## 2013-10-12 NOTE — ED Provider Notes (Signed)
CSN: [id number]     Arrival date & time 10/12/13  1903 History   None    Chief Complaint  Patient presents with  . Pelvic Pain   (Consider location/radiation/quality/duration/timing/severity/associated sxs/prior Treatment) HPI Comments: LNMP: 09-14-2013 G0P0 OB/GYN: none  Patient is a 23 y.o. female presenting with abdominal pain.  Abdominal Pain Pain location: pelvic. Pain quality: aching, cramping and sharp   Pain radiates to:  Back Pain severity:  Moderate Onset quality:  Gradual Duration:  6 days Timing:  Constant Progression:  Worsening Chronicity:  New Relieved by:  Nothing Associated symptoms: no anorexia, no constipation, no diarrhea, no dysuria, no fever, no flatus, no hematemesis, no hematuria, no melena, no nausea, no shortness of breath, no vaginal bleeding, no vaginal discharge and no vomiting   Risk factors: has not had multiple surgeries and not obese     Past Medical History  Diagnosis Date  . Pyelonephritis    History reviewed. No pertinent past surgical history. History reviewed. No pertinent family history. History  Substance Use Topics  . Smoking status: Never Smoker   . Smokeless tobacco: Never Used  . Alcohol Use: No   OB History   Grav Para Term Preterm Abortions TAB SAB Ect Mult Living                 Review of Systems  Constitutional: Negative for fever.  Respiratory: Negative for shortness of breath.   Gastrointestinal: Positive for abdominal pain. Negative for nausea, vomiting, diarrhea, constipation, melena, anorexia, flatus and hematemesis.  Genitourinary: Negative for dysuria, hematuria, vaginal bleeding and vaginal discharge.  All other systems reviewed and are negative.    Allergies  Ondansetron and Promethazine  Home Medications   Current Outpatient Rx  Name  Route  Sig  Dispense  Refill  . ibuprofen (ADVIL,MOTRIN) 200 MG tablet   Oral   Take 200 mg by mouth every 6 (six) hours as needed for pain.         .  hydrocortisone (ANUSOL-HC) 25 MG suppository   Rectal   Place 1 suppository (25 mg total) rectally 2 (two) times daily as needed for hemorrhoids or itching.   12 suppository   0    BP 129/79  Pulse 82  Temp(Src) 98.9 F (37.2 C) (Oral)  Resp 18  SpO2 100%  LMP 09/14/2013 Physical Exam  Nursing note and vitals reviewed. Constitutional: She is oriented to person, place, and time. She appears well-developed and well-nourished. No distress.  HENT:  Head: Normocephalic and atraumatic.  Eyes: Conjunctivae are normal. No scleral icterus.  Cardiovascular: Normal rate, regular rhythm and normal heart sounds.   Pulmonary/Chest: Effort normal and breath sounds normal. No respiratory distress. She has no wheezes.  Abdominal: Soft. Normal appearance. Bowel sounds are decreased. There is no hepatosplenomegaly. There is tenderness in the right lower quadrant, suprapubic area and left lower quadrant. There is no CVA tenderness.  Musculoskeletal: Normal range of motion.  Neurological: She is alert and oriented to person, place, and time.  Skin: Skin is warm and dry. No rash noted.  Psychiatric: She has a normal mood and affect. Her behavior is normal.    ED Course  Procedures (including critical care time) Labs Review Labs Reviewed  POCT PREGNANCY, URINE - Abnormal; Notable for the following:    Preg Test, Ur POSITIVE (*)    All other components within normal limits  POCT URINALYSIS DIP (DEVICE)   Imaging Review No results found.   MDM   1. Abdominal pain  in pregnancy    Abdominal pain: UA normal. UPT+. Hemodynamically stable. Hx and exam concerning for ectopic pregnancy. Case discussed with Almyra Free Either, PA  (on behalf of Dr. Kennon Rounds) at [facility]. Patient to be transferred to [facility] via [place] for evaluation. IV NS placed prior to transfer.    [place], [place] 10/12/13 2111

## 2013-10-12 NOTE — Discharge Instructions (Signed)
Pregnancy, First Trimester The first trimester is the first 3 months your baby is growing inside you. It is important to follow your doctor's instructions. HOME CARE   Do not smoke.  Do not drink alcohol.  Only take medicine as told by your doctor.  Exercise.  Eat healthy foods. Eat regular, well-balanced meals.  You can have sex (intercourse) if there are no other problems with the pregnancy.  Things that help with morning sickness:  Eat soda crackers before getting up in the morning.  Eat 4 to 5 small meals rather than 3 large meals.  Drink liquids between meals, not during meals.  Go to all appointments as told.  Take all vitamins or supplements as told by your doctor. GET HELP RIGHT AWAY IF:   You develop a fever.  You have a bad smelling fluid that is leaking from your vagina.  There is bleeding from the vagina.  You develop severe belly (abdominal) or back pain.  You throw up (vomit) blood. It may look like coffee grounds.  You lose more than 2 pounds in a week.  You gain 5 pounds or more in a week.  You gain more than 2 pounds in a week and you see puffiness (swelling) in your feet, ankles, or legs.  You have severe dizziness or pass out (faint).  You are around people who have [place] measles, chickenpox, or fifth disease.  You have a headache, watery poop (diarrhea), pain with peeing (urinating), or cannot breath right. Document Released: 12/25/2007 Document Revised: 09/30/2011 Document Reviewed: 12/25/2007 [facility] Patient Information 2014 [place], [place].

## 2013-10-12 NOTE — MAU Provider Note (Signed)
History     CSN: [id number]  Arrival date and time: 10/12/13 2141   First Provider Initiated Contact with Patient 10/12/13 2320      No chief complaint on file.  HPI [name] is a 23 y.o. G1P0 at [redacted]w[redacted]d who presents to MAU today from Urgent Care with complaint of +UPT and lower abdominal pain. The patient states LMP of 09/14/13. She states worsening of lower abdominal pain over the past few weeks. She rates her pain at 7/10 now. She has not taken any pain medication. She denies vaginal bleeding, discharge or fever. She has had occasional dysuria without frequency or urgency. She is also having mild low back pain.   OB History   Grav Para Term Preterm Abortions TAB SAB Ect Mult Living   1               Past Medical History  Diagnosis Date  . Pyelonephritis     Past Surgical History  Procedure Laterality Date  . No past surgeries      No family history on file.  History  Substance Use Topics  . Smoking status: Never Smoker   . Smokeless tobacco: Never Used  . Alcohol Use: No    Allergies:  Allergies  Allergen Reactions  . Ondansetron Hives and Itching  . Promethazine Hives and Itching    No prescriptions prior to admission    Review of Systems  Constitutional: Negative for fever and malaise/fatigue.  Gastrointestinal: Positive for abdominal pain. Negative for nausea, vomiting, diarrhea and constipation.  Genitourinary: Negative for dysuria, urgency, frequency and flank pain.       Neg - vaginal bleeding, discharge  Musculoskeletal: Positive for back pain.   Physical Exam   Blood pressure 121/84, pulse 82, temperature 98.2 F (36.8 C), temperature source Oral, resp. rate 16, last menstrual period 09/14/2013, SpO2 100.00%.  Physical Exam  Constitutional: She is oriented to person, place, and time. She appears well-developed and well-nourished. No distress.  HENT:  Head: Normocephalic and atraumatic.  Cardiovascular: Normal rate.   Respiratory:  Effort normal.  GI: Soft. Bowel sounds are normal. She exhibits no distension and no mass. There is tenderness (mild tenderness to palpation of the lower abdomen bilaterally). There is no rebound and no guarding.  Neurological: She is alert and oriented to person, place, and time.  Skin: Skin is warm and dry. No erythema.  Psychiatric: She has a normal mood and affect.  Pelvic: deferred, performed at Urgent Care  Results for orders placed during the hospital encounter of 10/12/13 (from the past 24 hour(s))  CBC     Status: Abnormal   Collection Time    10/12/13 10:35 PM      Result Value Ref Range   WBC 8.9  4.0 - 10.5 K/uL   RBC 3.76 (*) 3.87 - 5.11 MIL/uL   Hemoglobin 11.5 (*) 12.0 - 15.0 g/dL   HCT 34.9 (*) 36.0 - 46.0 %   MCV 92.8  78.0 - 100.0 fL   MCH 30.6  26.0 - 34.0 pg   MCHC 33.0  30.0 - 36.0 g/dL   RDW 12.0  11.5 - 15.5 %   Platelets 165  150 - 400 K/uL  ABO/RH     Status: None   Collection Time    10/12/13 10:35 PM      Result Value Ref Range   ABO/RH(D) AB POS    HCG, QUANTITATIVE, PREGNANCY     Status: Abnormal   Collection Time  10/12/13 10:35 PM      Result Value Ref Range   hCG, Beta Chain, Quant, S 533 (*) <5 mIU/mL   US Ob Comp Less 14 Wks  10/12/2013   CLINICAL DATA:  Pelvic pain.  EXAM: OBSTETRIC <14 WK ULTRASOUND  TECHNIQUE: Transabdominal ultrasound was performed for evaluation of the gestation as well as the maternal uterus and adnexal regions.  COMPARISON:  No priors.  FINDINGS: Intrauterine gestational sac: None  Yolk sac:  None  Embryo:  None  Cardiac Activity: None  Maternal uterus/adnexae: Uterus and ovaries are unremarkable in appearance. Large volume of free fluid in the cul-de-sac.  IMPRESSION: 1. No IUP identified. 2. Large volume of free fluid in the cul-de-sac. This is of uncertain etiology and significance but may be physiologic in this young female patient.   Electronically Signed   By: Vinnie Langton M.D.   On: 10/12/2013 23:19    MAU  Course  Procedures None  MDM +UPT at Urgent Care UA performed at Urgent Care is WNL CBC, ABO/Rh, quant hCG and [place] today Tylenol given in MAU  Assessment and Plan  A: Abdominal pain in pregnancy, antepartum  P: Discharge home Bleeding/ectopic precautions discussed Recommended Tylenol PRN Patient to return to MAU in 48 hours for follow-up labs Patient may return to MAU as needed sooner if symptoms change or worsen  Farris Has, PA-C  10/13/2013, 12:49 AM

## 2013-10-12 NOTE — MAU Note (Signed)
Arrival via EMS from [facility]. Pt presented there with abd pain, pregnancy test was positive.

## 2013-10-12 NOTE — ED Notes (Signed)
Report called to Alex RN.

## 2013-10-12 NOTE — ED Notes (Signed)
C/o low abdominal pain x 6-7 days.  LMP 2/24.  No vaginal discharge, burning or frequency or urination or hematuria.  No N,V or D.

## 2013-10-12 NOTE — ED Notes (Signed)
[facility] not available.  [facility] called and is coming.

## 2013-10-13 NOTE — Progress Notes (Signed)
Pt given tylenol for abdominal pain

## 2013-10-13 NOTE — MAU Provider Note (Signed)
Attestation of Attending Supervision of Advanced Practitioner (PA/CNM/NP): Evaluation and management procedures were performed by the Advanced Practitioner under my supervision and collaboration.  I have reviewed the Advanced Practitioner's note and chart, and I agree with the management and plan.  Donnamae Jude, MD [facility] Attending 10/13/2013 6:34 AM

## 2013-10-14 ENCOUNTER — Inpatient Hospital Stay (HOSPITAL_COMMUNITY)
Admission: AD | Admit: 2013-10-14 | Discharge: 2013-10-14 | Disposition: A | Discharge status: Home / Self Care | Payer: BC Managed Care – PPO | Source: Ambulatory Visit | Attending: Obstetrics & Gynecology | Admitting: Obstetrics & Gynecology

## 2013-10-14 DIAGNOSIS — O26899 Other specified pregnancy related conditions, unspecified trimester: Secondary | ICD-10-CM

## 2013-10-14 DIAGNOSIS — O9989 Other specified diseases and conditions complicating pregnancy, childbirth and the puerperium: Principal | ICD-10-CM

## 2013-10-14 DIAGNOSIS — O99891 Other specified diseases and conditions complicating pregnancy: Secondary | ICD-10-CM | POA: Insufficient documentation

## 2013-10-14 DIAGNOSIS — R109 Unspecified abdominal pain: Secondary | ICD-10-CM

## 2013-10-14 LAB — HCG, QUANTITATIVE, PREGNANCY: HCG, BETA CHAIN, QUANT, S: 1377 m[IU]/mL — AB (ref <5)

## 2013-10-14 NOTE — MAU Provider Note (Signed)
  History     CSN: [id number]  Arrival date and time: 10/14/13 1806   None     Chief Complaint  Patient presents with  . Follow-up   HPIpt is here for f/u HCG G1P0 [redacted]w[redacted]d pregnant from [place] seen 2 days ago with abd cramping with HCG 533 and ultrasound Not being able to see pregnancy.  Pt is not bleeding today and mild cramping.    Birdie Sons, RN Registered Nurse Signed  MAU Note Service date: 10/14/2013 6:33 PM   Patient to MAU for a follow up BHCG. Patient denies pain or bleeding.    hCG, Beta Chain, Quant, S <5 mIU/mL  1377 (H)    533 (H) CM     Past Medical History  Diagnosis Date  . Pyelonephritis     Past Surgical History  Procedure Laterality Date  . No past surgeries      No family history on file.  History  Substance Use Topics  . Smoking status: Never Smoker   . Smokeless tobacco: Never Used  . Alcohol Use: No    Allergies:  Allergies  Allergen Reactions  . Ondansetron Hives and Itching  . Promethazine Hives and Itching    Prescriptions prior to admission  Medication Sig Dispense Refill  . hydrocortisone (ANUSOL-HC) 25 MG suppository Place 1 suppository (25 mg total) rectally 2 (two) times daily as needed for hemorrhoids or itching.  12 suppository  0    ROS Physical Exam   Blood pressure 123/82, pulse 97, resp. rate 16, last menstrual period 09/14/2013, SpO2 100.00%.  Physical Exam  Vitals reviewed. Constitutional: She is oriented to person, place, and time. She appears well-developed and well-nourished. No distress.  petite  HENT:  Head: Normocephalic.  Eyes: Pupils are equal, round, and reactive to light.  Neck: Normal range of motion. Neck supple.  Cardiovascular: Normal rate.   Respiratory: Effort normal.  GI: Soft.  Musculoskeletal: Normal range of motion.  Neurological: She is alert and oriented to person, place, and time.  Skin: Skin is warm and dry.  Psychiatric: She has a normal mood and affect.    MAU Course    Procedures  Discussed with Dr. Gala Romney Will have pt return in 1 week for [place]- pt informed us will contact her for appt Return sooner for increase in pain or bleeding  Assessment and Plan  abd pain in pregnancy- appropriate rise in HCG F/u one week for repeat US  Adonis Yim 10/14/2013, 7:29 PM

## 2013-10-14 NOTE — MAU Note (Signed)
Patient to MAU for a follow up BHCG. Patient denies pain or bleeding.

## 2013-10-21 ENCOUNTER — Ambulatory Visit (HOSPITAL_COMMUNITY)
Admission: RE | Admit: 2013-10-21 | Discharge: 2013-10-21 | Disposition: A | Discharge status: Home / Self Care | Payer: BC Managed Care – PPO | Source: Ambulatory Visit | Attending: Gynecology | Admitting: Gynecology

## 2013-10-21 DIAGNOSIS — O30049 Twin pregnancy, dichorionic/diamniotic, unspecified trimester: Secondary | ICD-10-CM | POA: Insufficient documentation

## 2013-10-21 DIAGNOSIS — R109 Unspecified abdominal pain: Secondary | ICD-10-CM | POA: Insufficient documentation

## 2013-10-21 DIAGNOSIS — O99891 Other specified diseases and conditions complicating pregnancy: Secondary | ICD-10-CM | POA: Insufficient documentation

## 2013-10-21 DIAGNOSIS — O9989 Other specified diseases and conditions complicating pregnancy, childbirth and the puerperium: Principal | ICD-10-CM

## 2013-10-21 DIAGNOSIS — O26899 Other specified pregnancy related conditions, unspecified trimester: Secondary | ICD-10-CM

## 2013-10-21 DIAGNOSIS — O34599 Maternal care for other abnormalities of gravid uterus, unspecified trimester: Secondary | ICD-10-CM | POA: Insufficient documentation

## 2013-10-21 DIAGNOSIS — N831 Corpus luteum cyst of ovary, unspecified side: Secondary | ICD-10-CM | POA: Insufficient documentation

## 2013-10-21 DIAGNOSIS — O30009 Twin pregnancy, unspecified number of placenta and unspecified number of amniotic sacs, unspecified trimester: Secondary | ICD-10-CM | POA: Insufficient documentation

## 2013-10-30 ENCOUNTER — Emergency Department (HOSPITAL_COMMUNITY)
Admission: EM | Admit: 2013-10-30 | Discharge: 2013-10-30 | Disposition: A | Discharge status: Home / Self Care | Payer: BC Managed Care – PPO | Source: Home / Self Care

## 2013-10-30 ENCOUNTER — Encounter (HOSPITAL_COMMUNITY): Payer: Self-pay | Admitting: Emergency Medicine

## 2013-10-30 DIAGNOSIS — R05 Cough: Secondary | ICD-10-CM

## 2013-10-30 DIAGNOSIS — B349 Viral infection, unspecified: Secondary | ICD-10-CM

## 2013-10-30 DIAGNOSIS — R51 Headache: Secondary | ICD-10-CM

## 2013-10-30 DIAGNOSIS — Z3490 Encounter for supervision of normal pregnancy, unspecified, unspecified trimester: Secondary | ICD-10-CM

## 2013-10-30 DIAGNOSIS — Z331 Pregnant state, incidental: Secondary | ICD-10-CM

## 2013-10-30 DIAGNOSIS — R519 Headache, unspecified: Secondary | ICD-10-CM

## 2013-10-30 DIAGNOSIS — R059 Cough, unspecified: Secondary | ICD-10-CM

## 2013-10-30 DIAGNOSIS — B9789 Other viral agents as the cause of diseases classified elsewhere: Secondary | ICD-10-CM

## 2013-10-30 NOTE — Discharge Instructions (Signed)
Cough, Adult  A cough is a reflex that helps clear your throat and airways. It can help heal the body or may be a reaction to an irritated airway. A cough may only last 2 or 3 weeks (acute) or may last more than 8 weeks (chronic).  CAUSES Acute cough:  Viral or bacterial infections. Chronic cough:  Infections.  Allergies.  Asthma.  Post-nasal drip.  Smoking.  Heartburn or acid reflux.  Some medicines.  Chronic lung problems (COPD).  Cancer. SYMPTOMS   Cough.  Fever.  Chest pain.  Increased breathing rate.  High-pitched whistling sound when breathing (wheezing).  Colored mucus that you cough up (sputum). TREATMENT   A bacterial cough may be treated with antibiotic medicine.  A viral cough must run its course and will not respond to antibiotics.  Your caregiver may recommend other treatments if you have a chronic cough. HOME CARE INSTRUCTIONS   Only take over-the-counter or prescription medicines for pain, discomfort, or fever as directed by your caregiver. Use cough suppressants only as directed by your caregiver.  Use a cold steam vaporizer or humidifier in your bedroom or home to help loosen secretions.  Sleep in a semi-upright position if your cough is worse at night.  Rest as needed.  Stop smoking if you smoke. SEEK IMMEDIATE MEDICAL CARE IF:   You have pus in your sputum.  Your cough starts to worsen.  You cannot control your cough with suppressants and are losing sleep.  You begin coughing up blood.  You have difficulty breathing.  You develop pain which is getting worse or is uncontrolled with medicine.  You have a fever. MAKE SURE YOU:   Understand these instructions.  Will watch your condition.  Will get help right away if you are not doing well or get worse. Document Released: 01/04/2011 Document Revised: 09/30/2011 Document Reviewed: 01/04/2011 [facility] Patient Information 2014 [place].  Fever, Adult A fever is a  temperature of 100.4 F (38 C) or above.  HOME CARE  Take fever medicine as told by your doctor. Do not  take aspirin for fever if you are younger than 25 years of age.  If you are given antibiotic medicine, take it as told. Finish the medicine even if you start to feel better.  Rest.  Drink enough fluids to keep your pee (urine) clear or pale yellow. Do not drink alcohol.  Take a bath or shower with room temperature water. Do not use ice water or alcohol sponge baths.  Wear lightweight, loose clothes. GET HELP RIGHT AWAY IF:   You are short of breath or have trouble breathing.  You are very weak.  You are dizzy or you pass out (faint).  You are very thirsty or are making little or no urine.  You have new pain.  You throw up (vomit) or have watery poop (diarrhea).  You keep throwing up or having watery poop for more than 1 to 2 days.  You have a stiff neck or light bothers your eyes.  You have a skin rash.  You have a fever or problems (symptoms) that last for more than 2 to 3 days.  You have a fever and your problems quickly get worse.  You keep throwing up the fluids you drink.  You do not feel better after 3 days.  You have new problems. MAKE SURE YOU:   Understand these instructions.  Will watch your condition.  Will get help right away if you are not doing well or  get worse. Document Released: 04/16/2008 Document Revised: 09/30/2011 Document Reviewed: 05/09/2011 [facility] Patient Information 2014 [place].May take Tylenol EX strength 1 every 4 hours as needed for headaches and fever. Would check with public heath about safe medications to take for your cough. Natural remedies OTC for cough such as honey are ok.

## 2013-10-30 NOTE — ED Notes (Signed)
Pt c/o cold sxs onset 3 days Reports she is 6 weeks preg Sxs today include: fevers, chest d/c due to cough, abd pain, vomiting Denies diarrhea Alert w/no signs of acute distress.

## 2013-10-30 NOTE — ED Provider Notes (Signed)
CSN: [id number]     Arrival date & time 10/30/13  1846 History   None    Chief Complaint  Patient presents with  . URI   (Consider location/radiation/quality/duration/timing/severity/associated sxs/prior Treatment) HPI Comments: Patient presents [redacted] weeks pregnant. She has mild cough, headache and nausea for 2 days. Subjective fevers. No productive cough.   Patient is a 23 y.o. female presenting with URI. The history is provided by the patient.  URI Presenting symptoms: cough and fever   Presenting symptoms: no fatigue     Past Medical History  Diagnosis Date  . Pyelonephritis    Past Surgical History  Procedure Laterality Date  . No past surgeries     No family history on file. History  Substance Use Topics  . Smoking status: Never Smoker   . Smokeless tobacco: Never Used  . Alcohol Use: No   OB History   Grav Para Term Preterm Abortions TAB SAB Ect Mult Living   1              Review of Systems  Constitutional: Positive for fever. Negative for chills and fatigue.  HENT: Negative.   Eyes: Negative.   Respiratory: Positive for cough. Negative for shortness of breath.   Gastrointestinal: Negative for nausea, abdominal pain, diarrhea, constipation and abdominal distention.  Endocrine: Negative.   Genitourinary: Negative.   Skin: Negative.     Allergies  Ondansetron and Promethazine  Home Medications   Current Outpatient Rx  Name  Route  Sig  Dispense  Refill  . hydrocortisone (ANUSOL-HC) 25 MG suppository   Rectal   Place 1 suppository (25 mg total) rectally 2 (two) times daily as needed for hemorrhoids or itching.   12 suppository   0    BP 122/85  Pulse 87  Temp(Src) 98.8 F (37.1 C) (Oral)  Resp 16  SpO2 100%  LMP 09/14/2013 Physical Exam  Nursing note and vitals reviewed. Constitutional: She is oriented to person, place, and time. She appears well-developed and well-nourished. No distress.  HENT:  Head: Normocephalic and atraumatic.  Right Ear:  External ear normal.  Left Ear: External ear normal.  Mouth/Throat: Oropharynx is clear and moist.  Neck: Normal range of motion. Neck supple.  Cardiovascular: Normal rate and regular rhythm.   Pulmonary/Chest: Effort normal and breath sounds normal. No respiratory distress. She has no wheezes.  Abdominal: Soft. She exhibits no distension. There is no tenderness.  Lymphadenopathy:    She has no cervical adenopathy.  Neurological: She is alert and oriented to person, place, and time.  Skin: Skin is warm and dry. She is not diaphoretic.  Psychiatric: Her behavior is normal.    ED Course  Procedures (including critical care time) Labs Review Labs Reviewed - No data to display Imaging Review No results found.   MDM   1. Viral infection   2. Cough   3. Headache   4. Pregnant    No indication for an abx; specifically in first trimester. Tylenol is ok only if needed for headaches. Honey or home remedies for cough.  She has an appt with public health for pregnancy on Monday-Please check with them re: further medications.     Bjorn Pippin, PA-C 10/30/13 1958

## 2013-11-01 ENCOUNTER — Ambulatory Visit (HOSPITAL_COMMUNITY)
Admission: RE | Admit: 2013-11-01 | Discharge: 2013-11-01 | Disposition: A | Discharge status: Home / Self Care | Payer: BC Managed Care – PPO | Source: Ambulatory Visit | Attending: Gynecology | Admitting: Gynecology

## 2013-11-01 ENCOUNTER — Inpatient Hospital Stay (HOSPITAL_COMMUNITY)
Admission: AD | Admit: 2013-11-01 | Discharge: 2013-11-01 | Disposition: A | Discharge status: Home / Self Care | Payer: BC Managed Care – PPO | Source: Ambulatory Visit | Attending: Obstetrics & Gynecology | Admitting: Obstetrics & Gynecology

## 2013-11-01 ENCOUNTER — Other Ambulatory Visit (HOSPITAL_COMMUNITY): Payer: Self-pay | Admitting: Gynecology

## 2013-11-01 ENCOUNTER — Encounter (HOSPITAL_COMMUNITY): Payer: Self-pay | Admitting: *Deleted

## 2013-11-01 DIAGNOSIS — O30049 Twin pregnancy, dichorionic/diamniotic, unspecified trimester: Secondary | ICD-10-CM

## 2013-11-01 DIAGNOSIS — Z3689 Encounter for other specified antenatal screening: Secondary | ICD-10-CM | POA: Insufficient documentation

## 2013-11-01 DIAGNOSIS — O30009 Twin pregnancy, unspecified number of placenta and unspecified number of amniotic sacs, unspecified trimester: Secondary | ICD-10-CM | POA: Insufficient documentation

## 2013-11-01 DIAGNOSIS — Z3201 Encounter for pregnancy test, result positive: Secondary | ICD-10-CM

## 2013-11-01 DIAGNOSIS — O200 Threatened abortion: Secondary | ICD-10-CM

## 2013-11-01 DIAGNOSIS — R109 Unspecified abdominal pain: Secondary | ICD-10-CM | POA: Insufficient documentation

## 2013-11-01 NOTE — Discharge Instructions (Signed)
Threatened Miscarriage  Bleeding during the first 20 weeks of pregnancy is common. This is sometimes called a threatened miscarriage. This is a pregnancy that is threatening to end before the twentieth week of pregnancy. Often this bleeding stops with bed rest or decreased activities as suggested by your caregiver and the pregnancy continues without any more problems. You may be asked to not have sexual intercourse, have orgasms or use tampons until further notice. Sometimes a threatened miscarriage can progress to a complete or incomplete miscarriage. This may or may not require further treatment. Some miscarriages occur before a woman misses a menstrual period and knows she is pregnant.  Miscarriages occur in 15 to 20% of all pregnancies and usually occur during the first 13 weeks of the pregnancy. The exact cause of a miscarriage is usually never known. A miscarriage is natures way of ending a pregnancy that is abnormal or would not make it to term. There are some things that may put you at risk to have a miscarriage, such as:  · Hormone problems.  · Infection of the uterus or cervix.  · Chronic illness, diabetes for example, especially if it is not controlled.  · Abnormal shaped uterus.  · Fibroids in the uterus.  · Incompetent cervix (the cervix is too weak to hold the baby).  · Smoking.  · Drinking too much alcohol. It's best not to drink any alcohol when you are pregnant.  · Taking illegal drugs.  TREATMENT   When a miscarriage becomes complete and all products of conception (all the tissue in the uterus) have been passed, often no treatment is needed. If you think you passed tissue, save it in a container and take it to your doctor for evaluation. If the miscarriage is incomplete (parts of the fetus or placenta remain in the uterus), further treatment may be needed. The most common reason for further treatment is continued bleeding (hemorrhage) because pregnancy tissue did not pass out of the uterus. This  often occurs if a miscarriage is incomplete. Tissue left behind may also become infected. Treatment usually is dilatation and curettage (the removal of the remaining products of pregnancy. This can be done by a simple sucking procedure (suction curettage) or a simple scraping of the inside of the uterus. This may be done in the hospital or in the caregiver's office. This is only done when your caregiver knows that there is no chance for the pregnancy to proceed to term. This is determined by physical examination, negative pregnancy test, falling pregnancy hormone count and/or, an ultrasound revealing a dead fetus.  Miscarriages are often a very emotional time for prospective mothers and fathers. This is not you or your partners fault. It did not occur because of an inadequacy in you or your partner. Nearly all miscarriages occur because the pregnancy has started off wrongly. At least half of these pregnancies have a chromosomal abnormality. It is almost always not inherited. Others may have developmental problems with the fetus or placenta. This does not always show up even when the products miscarried are studied under the microscope. The miscarriage is nearly always not your fault and it is not likely that you could have prevented it from happening. If you are having emotional and grieving problems, talk to your health care provider and even seek counseling, if necessary, before getting pregnant again. You can begin trying for another pregnancy as soon as your caregiver says it is OK.  HOME CARE INSTRUCTIONS   · Your caregiver may order   to only getting up to go to the bathroom. You may be allowed to continue light activity. You may need to make arrangements for the care of your other children and for any other responsibilities.  Keep track of the number of pads you use each day, how often you have to change pads and how  saturated (soaked) they are. Record this information.  DO NOT USE TAMPONS. Do not douche, have sexual intercourse or orgasms until approved by your caregiver.  You may receive a follow up appointment for re-evaluation of your pregnancy and a repeat blood test. Re-evaluation often occurs after 2 days and again in 4 to 6 weeks. It is very important that you follow-up in the recommended time period.  If you are Rh negative and the father is Rh positive or you do not know the fathers' blood type, you may receive a shot (Rh immune globulin) to help prevent abnormal antibodies that can develop and affect the baby in any future pregnancies. SEEK IMMEDIATE MEDICAL CARE IF:  You have severe cramps in your stomach, back, or abdomen.  You have a sudden onset of severe pain in the lower part of your abdomen.  You develop chills.  You run an unexplained temperature of 101 F (38.3 C) or higher.  You pass large clots or tissue. Save any tissue for your caregiver to inspect.  Your bleeding increases or you become light-headed, weak, or have fainting episodes.  You have a gush of fluid from your vagina.  You pass out. This could mean you have a tubal (ectopic) pregnancy. Document Released: 07/08/2005 Document Revised: 09/30/2011 Document Reviewed: 02/22/2008 [facility] Patient Information 2014 [place].  Multiple Pregnancy A multiple pregnancy is when a woman is pregnant with two or more fetuses. Multiple pregnancies occur in about 3% of all births. The more babies in a pregnancy, the greater the risks of problems to the babies and mother. This includes death. Since the use of Assisted Reproductive Technology (ART) and medications that can induce ovulation, multiple fetal gestation has increased.  RISKS TO THE MOTHER  Preeclampsia and eclampsia.  Postpartum bleeding (hemorrhage).  Kidney infection (pyelonephritis).  Develop anemia.  Develop diabetes.  Liver complications.  A blood  clot blocks the artery, or branch of the artery leading to the lungs (pulmonary embolism).  Blood clots in the leg.  Placental separation.  Higher rate of Cesarean Section deliveries.  Women over 64 years old have a higher rate of Downs Syndrome babies. RISKS TO THE BABY  Preterm labor with a premature baby.  Very low birth weight babies that are less than 3 pounds, especially with triplets or mores.  Premature rupture of the membranes.  Twin to twin blood transfusion with one baby anemic and the other baby with too much blood in its system. There may also be heart failure.  With triplets or more, one of the babies is at high risk for cerebral palsy or other neurologic problem.  There is a higher incidence of fetal death. CARE OF MOTHERS WITH MULTIPLE FETAL GESTATION Multiple pregnancies need more care and special prenatal care.  You will see your caregiver more often.  You will have more tests including ultrasounds, nonstress tests and blood tests.  You will have special tests done called amniocentesis and a biophysical profile.  You may be hospitalized more often during the pregnancy.  You will be encouraged to eat a balanced and healthy diet with vitamin and mineral supplements as directed.  You will be asked to  get more rest and sleep to keep up your energy.  You will be asked to restrict your daily activities, exercise, work, household chores and sexual activity.  If you have preterm labor with small babies, you will be given a steroid injection to help the babies lungs mature and do better when born.  The delivery may have to be by Cesarean delivery, especially if there are triplets or more.  The delivery should be in a hospital with an intensive care nursery and Neonatologists (pediatrician for high risk babies) to care for the newborn babies. HOME CARE INSTRUCTIONS   Follow the caregiver's recommendations regarding office visits, tests for you and the babies,  diet, rest and medications.  Avoid a large amount of physical activity.  Arrange to have help after the babies are born and when you go home from the hospital.  Take classes on how to care for multiple babies before you deliver them. SEEK IMMEDIATE MEDICAL CARE IF:   You develop a temperature of 102 F (38.9 C) or higher.  You are leaking fluid from the vagina.  You develop vaginal bleeding.  You develop uterine contractions.  You develop a severe headache, severe upper abdominal pain, visual problems or excessive swelling of your face, hands and feet.  You develop severe back pain or leg pain.  You develop severe tiredness.  You develop chest pain.  You have shortness of breath, fall down or pass out. Document Released: 04/16/2008 Document Revised: 09/30/2011 Document Reviewed: 04/16/2008 [facility] Patient Information 2014 [place].

## 2013-11-01 NOTE — MAU Provider Note (Signed)
HPI:  [name] is a 23 y.o. female G1P0 at [redacted]w[redacted]d, twin gestation who presents to MAU following an US done in radiology for follow up viability.  Pt initially presented to MAU on 3/24 following a positive UPT at urgent care; sent here for further evaluation due to complaints of abdominal pain. [place] at that time showed no IUP. Pt was brought back 48 hours later for repeat beta quant which had appropriate rise.  On 4/2 the patient had a repeat US scan that showed twin gestation sacs consistent with dichorionic/diamniotic twin gestation; both with + yolk sacs.  Patient currently denies pain or bleeding.  Today's scan as follows:   Objective:  GENERAL: Well-developed, well-nourished female in no acute distress.  HEENT: Normocephalic, atraumatic.   LUNGS: Effort normal HEART: Regular rate  SKIN: Warm, dry and without erythema PSYCH: Normal mood and affect   Filed Vitals:   11/01/13 1725 11/01/13 1846  BP: 114/68 110/70  Pulse: 89 81  Temp: 98.9 F (37.2 C)   TempSrc: Oral   Resp: 18 16     ADDENDUM REPORT: 11/01/2013 17:52  ADDENDUM:  It should be noted that a yolk sac is identified within twin B  gestational sac despite the irregular appearance of the gestational  sac. Additionally TWIN A is documented as TWIN 1 in the body of the  report and twin B is documented as twin 2 in the body of the report  Electronically Signed  By: Shelly Koch M.D.  On: 11/01/2013 17:52       Study Result    CLINICAL DATA: Evaluate progression of intrauterine twin pregnancy  EXAM:  US OB TRANSVAGINAL  COMPARISON: US OB TRANSVAGINAL dated 10/21/2013  FINDINGS:  TWIN 1  Intrauterine gestational sac: Mildly irregular in shape, visualized  Yolk sac: Visualized  Embryo: Visualized  Cardiac Activity: Visualized  Heart Rate: 124 bpm  CRL: 6 mm 6 w 4d [place] EDC: 06/23/14  TWIN 2  Intrauterine gestational sac: Mildly irregular in shape, visualized  Yolk sac: Visualized  Embryo: Not visualized   Cardiac Activity: Not visualized  MSD: 18 mm 6 w 5 d [place] EDC: 06/22/14  Maternal uterus/adnexae: The ovaries are normal.  IMPRESSION:  Diamniotic dichorionic intrauterine twin gestation reidentified,  with interval development of 6 week 5 day embryo with cardiac  activity corresponding to twin A.  Twin B gestational sac is mildly irregular and no yolk sac, fetal  pole, or cardiac activity is now visualized corresponding to twin B.  Because intrauterine gestational sac and yolk sac were previously  visualized at the initial exam performed 11 days ago, dated  10/21/2013, and there has been no interval development of a  visualized embryo, findings are definitive for failed intrauterine  gestation involving twin B. Findings meet definitive criteria for  failed pregnancy (TWIN B ONLY). This follows SRU consensus  guidelines: Diagnostic Criteria for Nonviable Pregnancy Early in the  First Trimester. Shelly Koch J Med [phone].  Electronically Signed:  By: Shelly Koch M.D.  On: 11/01/2013 17:09      MDM: Dr. Ihor Koch in MAU to review [place] results. I will plan to repeat US in 2 weeks for viability.   Assessment:  Dichorionic diamniotic twin gestation Twin A with cardiac activity Twin B with IUP and yolk sac, however suspicious for failed gestation  Plan:  Discharge home in stable condition [facility] and [facility] for her prenatal care Warning signs discussed with the patient  at length  Return to MAU as needed, if symptoms worsen   Shelly Hillock Jaaziah Schulke, NP 11/02/2013 8:26 AM

## 2013-11-01 NOTE — MAU Note (Signed)
Patient brought from U/S so provider could discuss results. States she still feels cramping in her lower abdomen.

## 2013-11-02 NOTE — MAU Provider Note (Signed)
Attestation of Attending Supervision of Advanced Practitioner (CNM/NP): Evaluation and management procedures were performed by the Advanced Practitioner under my supervision and collaboration. I have reviewed the Advanced Practitioner's note and chart, and I agree with the management and plan.  Guss Bunde 3:19 PM

## 2013-11-02 NOTE — ED Provider Notes (Signed)
Medical screening examination/treatment/procedure(s) were performed by a resident physician or non-physician practitioner and as the supervising physician I was immediately available for consultation/collaboration.  Lynne Leader, MD    Gregor Hams, MD 11/02/13 [id number]

## 2013-11-04 ENCOUNTER — Inpatient Hospital Stay (HOSPITAL_COMMUNITY): Payer: BC Managed Care – PPO

## 2013-11-04 ENCOUNTER — Encounter (HOSPITAL_COMMUNITY): Payer: Self-pay | Admitting: *Deleted

## 2013-11-04 ENCOUNTER — Inpatient Hospital Stay (HOSPITAL_COMMUNITY)
Admission: AD | Admit: 2013-11-04 | Discharge: 2013-11-04 | Disposition: A | Discharge status: Home / Self Care | Payer: BC Managed Care – PPO | Source: Ambulatory Visit | Attending: Obstetrics & Gynecology | Admitting: Obstetrics & Gynecology

## 2013-11-04 DIAGNOSIS — R109 Unspecified abdominal pain: Secondary | ICD-10-CM | POA: Insufficient documentation

## 2013-11-04 DIAGNOSIS — O30049 Twin pregnancy, dichorionic/diamniotic, unspecified trimester: Secondary | ICD-10-CM

## 2013-11-04 DIAGNOSIS — O30009 Twin pregnancy, unspecified number of placenta and unspecified number of amniotic sacs, unspecified trimester: Secondary | ICD-10-CM | POA: Insufficient documentation

## 2013-11-04 DIAGNOSIS — O418X10 Other specified disorders of amniotic fluid and membranes, first trimester, not applicable or unspecified: Secondary | ICD-10-CM

## 2013-11-04 DIAGNOSIS — O208 Other hemorrhage in early pregnancy: Secondary | ICD-10-CM | POA: Insufficient documentation

## 2013-11-04 DIAGNOSIS — O468X1 Other antepartum hemorrhage, first trimester: Secondary | ICD-10-CM

## 2013-11-04 LAB — URINALYSIS, ROUTINE W REFLEX MICROSCOPIC
BILIRUBIN URINE: NEGATIVE
Glucose, UA: NEGATIVE mg/dL
KETONES UR: 15 mg/dL — AB
Leukocytes, UA: NEGATIVE
NITRITE: NEGATIVE
PROTEIN: NEGATIVE mg/dL
Specific Gravity, Urine: 1.02 (ref 1.005–1.030)
UROBILINOGEN UA: 0.2 mg/dL (ref 0.0–1.0)
pH: 7.5 (ref 5.0–8.0)

## 2013-11-04 LAB — CBC
HCT: 31.8 % — ABNORMAL LOW (ref 36.0–46.0)
Hemoglobin: 10.8 g/dL — ABNORMAL LOW (ref 12.0–15.0)
MCH: 31.3 pg (ref 26.0–34.0)
MCHC: 34 g/dL (ref 30.0–36.0)
MCV: 92.2 fL (ref 78.0–100.0)
PLATELETS: 181 10*3/uL (ref 150–400)
RBC: 3.45 MIL/uL — ABNORMAL LOW (ref 3.87–5.11)
RDW: 12.1 % (ref 11.5–15.5)
WBC: 9.1 10*3/uL (ref 4.0–10.5)

## 2013-11-04 LAB — URINE MICROSCOPIC-ADD ON

## 2013-11-04 NOTE — MAU Provider Note (Signed)
Chief Complaint: Abdominal Pain and Vaginal Bleeding   First Provider Initiated Contact with Patient 11/04/13 1055     SUBJECTIVE HPI: Shelly Koch is a 23 y.o. G1P0 at [redacted]w[redacted]d by LMP who presents to maternity admissions reporting spotting in early pregnancy.  She was seen on 3/26 in MAU for abdominal pain and had f/u quant and ultrasound showing IUP with di/di twins gestation, Baby A with FHR and Baby B with irregular sac and no fetal pole.  She started spotting early this morning.  She denies LOF, vaginal itching/burning, urinary symptoms, h/a, dizziness, n/v, or fever/chills.    Past Medical History  Diagnosis Date  . Pyelonephritis    Past Surgical History  Procedure Laterality Date  . No past surgeries     History   Social History  . Marital Status: Single    Spouse Name: N/A    Number of Children: N/A  . Years of Education: N/A   Occupational History  . Not on file.   Social History Main Topics  . Smoking status: Never Smoker   . Smokeless tobacco: Never Used  . Alcohol Use: No  . Drug Use: No  . Sexual Activity: Yes    Partners: Male    Birth Control/ Protection: None     Comment: Stopped Depo in November; trying for pregnancy   Other Topics Concern  . Not on file   Social History Narrative  . No narrative on file   No current facility-administered medications on file prior to encounter.   Current Outpatient Prescriptions on File Prior to Encounter  Medication Sig Dispense Refill  . acetaminophen (TYLENOL) 500 MG tablet Take 500 mg by mouth every 6 (six) hours as needed for moderate pain.      . Prenatal Vit-Fe Fumarate-FA (PRENATAL MULTIVITAMIN) TABS tablet Take 1 tablet by mouth at bedtime.        Allergies  Allergen Reactions  . Ondansetron Hives and Itching  . Promethazine Hives and Itching    ROS: Pertinent items in HPI  OBJECTIVE Blood pressure 115/69, pulse 90, temperature 97.6 F (36.4 C), temperature source Oral, resp. rate 16, height 4' 9.25"  (1.454 m), weight 39.463 kg (87 lb), last menstrual period 09/14/2013. GENERAL: Well-developed, well-nourished female in no acute distress.  HEENT: Normocephalic HEART: normal rate RESP: normal effort ABDOMEN: Soft, non-tender EXTREMITIES: Nontender, no edema NEURO: Alert and oriented Pelvic exam: Cervix pink, visually closed, without lesion, scant vaginal bleeding, vaginal walls and external genitalia normal   LAB RESULTS Results for orders placed during the hospital encounter of 11/04/13 (from the past 24 hour(s))  URINALYSIS, ROUTINE W REFLEX MICROSCOPIC     Status: Abnormal   Collection Time    11/04/13 10:15 AM      Result Value Ref Range   Color, Urine YELLOW  YELLOW   APPearance CLEAR  CLEAR   Specific Gravity, Urine 1.020  1.005 - 1.030   pH 7.5  5.0 - 8.0   Glucose, UA NEGATIVE  NEGATIVE mg/dL   Hgb urine dipstick SMALL (*) NEGATIVE   Bilirubin Urine NEGATIVE  NEGATIVE   Ketones, ur 15 (*) NEGATIVE mg/dL   Protein, ur NEGATIVE  NEGATIVE mg/dL   Urobilinogen, UA 0.2  0.0 - 1.0 mg/dL   Nitrite NEGATIVE  NEGATIVE   Leukocytes, UA NEGATIVE  NEGATIVE  URINE MICROSCOPIC-ADD ON     Status: Abnormal   Collection Time    11/04/13 10:15 AM      Result Value Ref Range   Squamous  Epithelial / LPF FEW (*) RARE   WBC, UA 0-2  <3 WBC/hpf   RBC / HPF 0-2  <3 RBC/hpf   Urine-Other MUCOUS PRESENT    CBC     Status: Abnormal   Collection Time    11/04/13 10:58 AM      Result Value Ref Range   WBC 9.1  4.0 - 10.5 K/uL   RBC 3.45 (*) 3.87 - 5.11 MIL/uL   Hemoglobin 10.8 (*) 12.0 - 15.0 g/dL   HCT 31.8 (*) 36.0 - 46.0 %   MCV 92.2  78.0 - 100.0 fL   MCH 31.3  26.0 - 34.0 pg   MCHC 34.0  30.0 - 36.0 g/dL   RDW 12.1  11.5 - 15.5 %   Platelets 181  150 - 400 K/uL    IMAGING   US Ob Transvaginal  11/04/2013   CLINICAL DATA:  Vaginal bleeding.  Known when just patient.  EXAM: TRANSVAGINAL OB ULTRASOUND  TECHNIQUE: Transvaginal ultrasound was performed for complete evaluation of  the gestation as well as the maternal uterus, adnexal regions, and pelvic cul-de-sac.  COMPARISON:  11/01/2013  FINDINGS: There is a dichorionic, diamniotic pregnancy.  Twin A:  Intrauterine gestational sac: Visualized/normal in shape.  Yolk sac:  Yes  Embryo:  Yes  Cardiac Activity: Yes  Heart Rate: 140 bpm  CRL:   11.8  mm   7 w 3 d                  [place] EDC: 06/20/2014  Twin B  Intrauterine gestational sac: Visualized/ irregular in shape.  Yolk sac:  Not visualized  Embryo:  Yes  Cardiac Activity: Yes  Heart Rate: 109 bpm  CRL:   8.9  mm   7 w 0 d                  [place] EDC: 06/23/2014  Maternal uterus/adnexae: No uterine mass. Possible small subchorionic hemorrhage. Cervix is closed. Normal ovaries. No adnexal masses or free fluid.  IMPRESSION: 1. Dichorionic, diamniotic twin pregnancy. 2. Twin A demonstrates no abnormalities. Gestational age has advanced as expected since the prior study. 3. Twin B is now visualized, but difficult to image sonographically due to the irregular gestational sac. It is mildly smaller than twin A. Fetal heart rate is slower. Twin B is still at risk and short term follow up is indicated.   Electronically Signed   By: Lajean Manes M.D.   On: 11/04/2013 12:00     ASSESSMENT 1. Dichorionic diamniotic twin gestation   2. Subchorionic hemorrhage in first trimester     PLAN Discharge home with bleeding precautions F/U in [facility] for prenatal care--message sent Discussed [facility] and irregular gestational sac of Baby B with pt.  Will continue to evaluate in pregnancy. Return to MAU as needed    Medication List         acetaminophen 500 MG tablet  Commonly known as:  TYLENOL  Take 500 mg by mouth every 6 (six) hours as needed for moderate pain.     prenatal multivitamin Tabs tablet  Take 1 tablet by mouth at bedtime.           Follow-up Information   Follow up with [facility] (The clinic will call you to make an appointment or call the number below.  Return to  MAU as needed.)    Specialty:  Obstetrics and Gynecology   Contact information:   [place]  [place] [address] [place] Certified Nurse-Midwife 11/04/2013  12:20 PM

## 2013-11-04 NOTE — Discharge Instructions (Signed)
Vaginal Bleeding During Pregnancy, First Trimester °A small amount of bleeding (spotting) from the vagina is relatively common in early pregnancy. It usually stops on its own. Various things may cause bleeding or spotting in early pregnancy. Some bleeding may be related to the pregnancy, and some may not. In most cases, the bleeding is normal and is not a problem. However, bleeding can also be a sign of something serious. Be sure to tell your health care provider about any vaginal bleeding right away. °Some possible causes of vaginal bleeding during the first trimester include: °· Infection or inflammation of the cervix. °· Growths (polyps) on the cervix. °· Miscarriage or threatened miscarriage. °· Pregnancy tissue has developed outside of the uterus and in a fallopian tube (tubal pregnancy). °· Tiny cysts have developed in the uterus instead of pregnancy tissue (molar pregnancy). °HOME CARE INSTRUCTIONS  °Watch your condition for any changes. The following actions may help to lessen any discomfort you are feeling: °· Follow your health care provider's instructions for limiting your activity. If your health care provider orders bed rest, you may need to stay in bed and only get up to use the bathroom. However, your health care provider may allow you to continue light activity. °· If needed, make plans for someone to help with your regular activities and responsibilities while you are on bed rest. °· Keep track of the number of pads you use each day, how often you change pads, and how soaked (saturated) they are. Write this down. °· Do not use tampons. Do not douche. °· Do not have sexual intercourse or orgasms until approved by your health care provider. °· If you pass any tissue from your vagina, save the tissue so you can show it to your health care provider. °· Only take over-the-counter or prescription medicines as directed by your health care provider. °· Do not take aspirin because it can make you  bleed. °· Keep all follow-up appointments as directed by your health care provider. °SEEK MEDICAL CARE IF: °· You have any vaginal bleeding during any part of your pregnancy. °· You have cramps or labor pains. °SEEK IMMEDIATE MEDICAL CARE IF:  °· You have severe cramps in your back or belly (abdomen). °· You have a fever, not controlled by medicine. °· You pass large clots or tissue from your vagina. °· Your bleeding increases. °· You feel lightheaded or weak, or you have fainting episodes. °· You have chills. °· You are leaking fluid or have a gush of fluid from your vagina. °· You pass out while having a bowel movement. °MAKE SURE YOU: °· Understand these instructions. °· Will watch your condition. °· Will get help right away if you are not doing well or get worse. °Document Released: 04/17/2005 Document Revised: 04/28/2013 Document Reviewed: 03/15/2013 °ExitCare® Patient Information ©2014 [employer]. ° °

## 2013-11-04 NOTE — MAU Note (Signed)
Started spotting on tues.  Yesterday started having light cramping and back pain.

## 2013-11-07 NOTE — MAU Provider Note (Signed)
Attestation of Attending Supervision of Advanced Practitioner (CNM/NP): Evaluation and management procedures were performed by the Advanced Practitioner under my supervision and collaboration.  I have reviewed the Advanced Practitioner's note and chart, and I agree with the management and plan.  Shantaya Bluestone Harraway-Smith 11:44 AM

## 2013-11-08 NOTE — MAU Provider Note (Signed)
Attestation of Attending Supervision of Advanced Practitioner (PA/CNM/NP): Evaluation and management procedures were performed by the Advanced Practitioner under my supervision and collaboration.  I have reviewed the Advanced Practitioner's note and chart, and I agree with the management and plan.  Rahaf Carbonell, MD, FACOG Attending Obstetrician & Gynecologist Faculty Practice, [facility]  

## 2013-11-14 ENCOUNTER — Encounter (HOSPITAL_COMMUNITY): Payer: Self-pay | Admitting: Emergency Medicine

## 2013-11-14 ENCOUNTER — Emergency Department (HOSPITAL_COMMUNITY)
Admission: EM | Admit: 2013-11-14 | Discharge: 2013-11-14 | Disposition: A | Discharge status: Home / Self Care | Payer: BC Managed Care – PPO | Source: Home / Self Care | Attending: Family Medicine | Admitting: Family Medicine

## 2013-11-14 DIAGNOSIS — G43909 Migraine, unspecified, not intractable, without status migrainosus: Secondary | ICD-10-CM

## 2013-11-14 DIAGNOSIS — O210 Mild hyperemesis gravidarum: Secondary | ICD-10-CM

## 2013-11-14 MED ORDER — PROCHLORPERAZINE MALEATE 10 MG PO TABS: 10.0000 mg | ORAL_TABLET | Freq: Three times a day (TID) | ORAL | Status: DC | PRN

## 2013-11-14 NOTE — ED Notes (Signed)
Patient here for headache Vomiting and fever past two days

## 2013-11-14 NOTE — ED Provider Notes (Signed)
Maeve Debord is a 23 y.o. female who presents to Urgent Care today for headache. Patient said headache and vomiting for the past 2 days. Symptoms are consistent with prior episodes of migraines. She has photophobia and phonophobia. She has not tried any medications yet. She is currently pregnant. She is approximately [redacted] weeks pregnant with a last menstrual period of February 24. She does not yet have an OB/GYN doctor.   Past Medical History  Diagnosis Date  . Pyelonephritis    History  Substance Use Topics  . Smoking status: Never Smoker   . Smokeless tobacco: Never Used  . Alcohol Use: No   ROS as above Medications: No current facility-administered medications for this encounter.   Current Outpatient Prescriptions  Medication Sig Dispense Refill  . acetaminophen (TYLENOL) 500 MG tablet Take 500 mg by mouth every 6 (six) hours as needed for moderate pain.      . Prenatal Vit-Fe Fumarate-FA (PRENATAL MULTIVITAMIN) TABS tablet Take 1 tablet by mouth at bedtime.       . prochlorperazine (COMPAZINE) 10 MG tablet Take 1 tablet (10 mg total) by mouth every 8 (eight) hours as needed for nausea or vomiting.  30 tablet  0    Exam:  BP 114/66  Pulse 84  Temp(Src) 98.3 F (36.8 C) (Oral)  SpO2 100%  LMP 09/14/2013 Gen: Well NAD HEENT: EOMI,  MMM PERRLA. Lungs: Normal work of breathing. CTABL Heart: RRR no MRG Abd: NABS, Soft. NT, ND Exts: Brisk capillary refill, warm and well perfused.  Neuro: Alert and oriented normal coordination gait strength and sensation. Normal balance.  No results found for this or any previous visit (from the past 24 hour(s)). No results found.  Assessment and Plan: 23 y.o. female with migraine complicated by pregnancy. Plan to treat with ibuprofen and Compazine. These medications are reasonably safe to use in the first trimester. She is allergic to Phenergan and Zofran.  Encourage patient to followup with her primary care provider or OB/GYN doctor for this  issue.  Discussed warning signs or symptoms. Please see discharge instructions. Patient expresses understanding.    Gregor Hams, MD 11/14/13 [phone]

## 2013-11-14 NOTE — Discharge Instructions (Signed)
Thank you for coming in today. Take compazine for nausea as needed.  Do not take in the 3rd trimester.  Use ibuprofen 2-3 pills (400-600mg  total) every 8 hours for pain as needed.  Do not take in the 3rd trimester.  Use tylenol as needed as well.  Follow up with your doctor soon.  Go to the emergency room if your headache becomes excruciating or you have weakness or numbness or uncontrolled vomiting.   Migraine Headache A migraine headache is an intense, throbbing pain on one or both sides of your head. A migraine can last for 30 minutes to several hours. CAUSES  The exact cause of a migraine headache is not always known. However, a migraine may be caused when nerves in the brain become irritated and release chemicals that cause inflammation. This causes pain. Certain things may also trigger migraines, such as:  Alcohol.  Smoking.  Stress.  Menstruation.  Aged cheeses.  Foods or drinks that contain nitrates, glutamate, aspartame, or tyramine.  Lack of sleep.  Chocolate.  Caffeine.  Hunger.  Physical exertion.  Fatigue.  Medicines used to treat chest pain (nitroglycerine), birth control pills, estrogen, and some blood pressure medicines. SIGNS AND SYMPTOMS  Pain on one or both sides of your head.  Pulsating or throbbing pain.  Severe pain that prevents daily activities.  Pain that is aggravated by any physical activity.  Nausea, vomiting, or both.  Dizziness.  Pain with exposure to bright lights, loud noises, or activity.  General sensitivity to bright lights, loud noises, or smells. Before you get a migraine, you may get warning signs that a migraine is coming (aura). An aura may include:  Seeing flashing lights.  Seeing bright spots, halos, or zig-zag lines.  Having tunnel vision or blurred vision.  Having feelings of numbness or tingling.  Having trouble talking.  Having muscle weakness. DIAGNOSIS  A migraine headache is often diagnosed based  on:  Symptoms.  Physical exam.  A CT scan or MRI of your head. These imaging tests cannot diagnose migraines, but they can help rule out other causes of headaches. TREATMENT Medicines may be given for pain and nausea. Medicines can also be given to help prevent recurrent migraines.  HOME CARE INSTRUCTIONS  Only take over-the-counter or prescription medicines for pain or discomfort as directed by your health care provider. The use of long-term narcotics is not recommended.  Lie down in a dark, quiet room when you have a migraine.  Keep a journal to find out what may trigger your migraine headaches. For example, write down:  What you eat and drink.  How much sleep you get.  Any change to your diet or medicines.  Limit alcohol consumption.  Quit smoking if you smoke.  Get 7 9 hours of sleep, or as recommended by your health care provider.  Limit stress.  Keep lights dim if bright lights bother you and make your migraines worse. SEEK IMMEDIATE MEDICAL CARE IF:   Your migraine becomes severe.  You have a fever.  You have a stiff neck.  You have vision loss.  You have muscular weakness or loss of muscle control.  You start losing your balance or have trouble walking.  You feel faint or pass out.  You have severe symptoms that are different from your first symptoms. MAKE SURE YOU:   Understand these instructions.  Will watch your condition.  Will get help right away if you are not doing well or get worse. Document Released: 07/08/2005 Document Revised:  04/28/2013 Document Reviewed: 03/15/2013 ExitCare Patient Information 2014 [place], [place].

## 2013-11-15 ENCOUNTER — Ambulatory Visit (HOSPITAL_COMMUNITY): Payer: BC Managed Care – PPO

## 2013-11-16 ENCOUNTER — Inpatient Hospital Stay (HOSPITAL_COMMUNITY)
Admission: AD | Admit: 2013-11-16 | Discharge: 2013-11-16 | Disposition: A | Discharge status: Home / Self Care | Payer: BC Managed Care – PPO | Source: Ambulatory Visit | Attending: Obstetrics & Gynecology | Admitting: Obstetrics & Gynecology

## 2013-11-16 ENCOUNTER — Ambulatory Visit (HOSPITAL_COMMUNITY)
Admission: RE | Admit: 2013-11-16 | Discharge: 2013-11-16 | Disposition: A | Discharge status: Home / Self Care | Payer: BC Managed Care – PPO | Source: Ambulatory Visit | Attending: Obstetrics and Gynecology | Admitting: Obstetrics and Gynecology

## 2013-11-16 DIAGNOSIS — IMO0002 Reserved for concepts with insufficient information to code with codable children: Secondary | ICD-10-CM | POA: Insufficient documentation

## 2013-11-16 DIAGNOSIS — Z3689 Encounter for other specified antenatal screening: Secondary | ICD-10-CM

## 2013-11-16 DIAGNOSIS — O30009 Twin pregnancy, unspecified number of placenta and unspecified number of amniotic sacs, unspecified trimester: Secondary | ICD-10-CM | POA: Insufficient documentation

## 2013-11-16 DIAGNOSIS — O30049 Twin pregnancy, dichorionic/diamniotic, unspecified trimester: Secondary | ICD-10-CM | POA: Insufficient documentation

## 2013-11-16 DIAGNOSIS — O210 Mild hyperemesis gravidarum: Secondary | ICD-10-CM | POA: Insufficient documentation

## 2013-11-16 DIAGNOSIS — O200 Threatened abortion: Secondary | ICD-10-CM

## 2013-11-16 NOTE — MAU Provider Note (Signed)
HPI:  [name] is a 23 y.o.female here for a follow up US. Initial scan on 4/2 showed twin gestation sacs consistent with dichorionic/diamniotic twin gestation; both with + yolk sacs. Patient had a follow up scan on 4/13 that showed Baby B to have an irregular sac; no fetal pole and. Patient had another scan on 4/16 that showed Twin B ivisualized, but difficult to image sonographically due to the irregular gestational sac. It is mildly smaller than twin A. Fetal heart rate is slower. Twin B is still at risk and short term follow up is indicated. Today's US shows fetal demise of twin B. Patient denies bleeding or pain at this time. Patient complains of occasional nausea and vomiting. She has an appointment in the clinic on May 22.   Objective.   GENERAL: Well-developed, well-nourished female in no acute distress.  HEENT: Normocephalic, atraumatic.   LUNGS: Effort normal HEART: Regular rate  SKIN: Warm, dry and without erythema PSYCH: Normal mood and affect  Filed Vitals:   11/16/13 1710  BP: 123/76  Pulse: 82  Temp: 98.5 F (36.9 C)  Resp: 16   US Ob Transvaginal  11/16/2013   CLINICAL DATA:  Followup viability.  Twins.  EXAM: US OB TRANSVAGINAL  COMPARISON:  11/04/2013  FINDINGS: TWIN 1  Intrauterine gestational sac: Visualized/normal in shape.  Yolk sac:  Visualized  Embryo:  Visualized  Cardiac Activity: Visualized  Heart Rate: 172 bpm  MSD:   mm    w     d  CRL:  22.8  mm   9 w 0 d                  [place] EDC: 06/21/2014  TWIN 2  Intrauterine gestational sac: Irregular, lenticular in shape.  Yolk sac:  Not visualized  Embryo:  Visualized  Cardiac Activity: Not visualized  MSD:   mm    w     d  CRL:  7.4  mm   6 w 5 d  Maternal uterus/adnexae: No free fluid. No subchorionic hemorrhage. No adnexal masses. Ovaries symmetric in size and echotexture.  IMPRESSION: Intrauterine fetal demise of twin B. No fetal heart tones detectable. Crown-rump length of twin B has decreased.  Twin A: 9 week  0 day by crown-rump length. Fetal heart rate 170 beats per min.   Electronically Signed   By: Rolm Baptise M.D.   On: 11/16/2013 16:08    A:  Dichorionic/ diamniotic twin gestation Intrauterine fetal demise of twin B Twin A 9 weeks 0 day.   Plan: Discharge home in stable condition Return to MAU with any pain or bleeding Pelvic rest Keep clinic appointment as scheduled Unisom and B6 for Nausea and vomiting.  Support given; questions answered.   Darrelyn Hillock Joane Postel, NP 11/16/2013 5:46 PM

## 2013-11-16 NOTE — MAU Provider Note (Signed)
Attestation of Attending Supervision of Advanced Practitioner (CNM/NP): Evaluation and management procedures were performed by the Advanced Practitioner under my supervision and collaboration.  I have reviewed the Advanced Practitioner's note and chart, and I agree with the management and plan.  Joselyn Edling Harraway-Smith 7:20 PM

## 2013-11-16 NOTE — MAU Note (Signed)
Pt here for F/U U/S today, denies pain or bleeding.

## 2013-11-19 ENCOUNTER — Encounter (HOSPITAL_COMMUNITY): Payer: Self-pay | Admitting: *Deleted

## 2013-11-19 ENCOUNTER — Inpatient Hospital Stay (HOSPITAL_COMMUNITY)
Admission: AD | Admit: 2013-11-19 | Discharge: 2013-11-20 | Disposition: A | Discharge status: Home / Self Care | Payer: BC Managed Care – PPO | Source: Ambulatory Visit | Attending: Obstetrics and Gynecology | Admitting: Obstetrics and Gynecology

## 2013-11-19 DIAGNOSIS — R509 Fever, unspecified: Secondary | ICD-10-CM | POA: Diagnosis not present

## 2013-11-19 DIAGNOSIS — O210 Mild hyperemesis gravidarum: Secondary | ICD-10-CM | POA: Insufficient documentation

## 2013-11-19 DIAGNOSIS — O99891 Other specified diseases and conditions complicating pregnancy: Secondary | ICD-10-CM | POA: Diagnosis not present

## 2013-11-19 DIAGNOSIS — O9989 Other specified diseases and conditions complicating pregnancy, childbirth and the puerperium: Secondary | ICD-10-CM

## 2013-11-19 DIAGNOSIS — R109 Unspecified abdominal pain: Secondary | ICD-10-CM | POA: Diagnosis not present

## 2013-11-19 DIAGNOSIS — O219 Vomiting of pregnancy, unspecified: Secondary | ICD-10-CM

## 2013-11-19 LAB — COMPREHENSIVE METABOLIC PANEL
ALK PHOS: 35 U/L — AB (ref 39–117)
ALT: 28 U/L (ref 0–35)
AST: 24 U/L (ref 0–37)
Albumin: 3.5 g/dL (ref 3.5–5.2)
BILIRUBIN TOTAL: 0.4 mg/dL (ref 0.3–1.2)
BUN: 3 mg/dL — AB (ref 6–23)
CO2: 22 mEq/L (ref 19–32)
Calcium: 8.8 mg/dL (ref 8.4–10.5)
Chloride: 97 mEq/L (ref 96–112)
Creatinine, Ser: 0.38 mg/dL — ABNORMAL LOW (ref 0.50–1.10)
GLUCOSE: 79 mg/dL (ref 70–99)
POTASSIUM: 4.1 meq/L (ref 3.7–5.3)
Sodium: 134 mEq/L — ABNORMAL LOW (ref 137–147)
Total Protein: 6.3 g/dL (ref 6.0–8.3)

## 2013-11-19 LAB — CBC
HEMATOCRIT: 28 % — AB (ref 36.0–46.0)
HEMOGLOBIN: 9.8 g/dL — AB (ref 12.0–15.0)
MCH: 31.9 pg (ref 26.0–34.0)
MCHC: 35 g/dL (ref 30.0–36.0)
MCV: 91.2 fL (ref 78.0–100.0)
Platelets: 165 10*3/uL (ref 150–400)
RBC: 3.07 MIL/uL — ABNORMAL LOW (ref 3.87–5.11)
RDW: 11.6 % (ref 11.5–15.5)
WBC: 7 10*3/uL (ref 4.0–10.5)

## 2013-11-19 LAB — URINALYSIS, ROUTINE W REFLEX MICROSCOPIC
Bilirubin Urine: NEGATIVE
GLUCOSE, UA: NEGATIVE mg/dL
Hgb urine dipstick: NEGATIVE
LEUKOCYTES UA: NEGATIVE
Nitrite: NEGATIVE
PH: 8 (ref 5.0–8.0)
Protein, ur: NEGATIVE mg/dL
Specific Gravity, Urine: 1.01 (ref 1.005–1.030)
Urobilinogen, UA: 0.2 mg/dL (ref 0.0–1.0)

## 2013-11-19 MED ORDER — LACTATED RINGERS IV SOLN
INTRAVENOUS | Status: DC
Start: 2013-11-19 — End: 2013-11-20
  Administered 2013-11-19: 23:00:00 via INTRAVENOUS

## 2013-11-19 MED ORDER — LACTATED RINGERS IV BOLUS (SEPSIS)
1000.0000 mL | Freq: Once | INTRAVENOUS | Status: AC
  Administered 2013-11-19: 1000 mL via INTRAVENOUS

## 2013-11-19 MED ORDER — METOCLOPRAMIDE HCL 5 MG/ML IJ SOLN
10.0000 mg | Freq: Once | INTRAMUSCULAR | Status: AC
  Administered 2013-11-19: 10 mg via INTRAVENOUS
  Filled 2013-11-19: qty 2

## 2013-11-19 NOTE — MAU Provider Note (Signed)
History     CSN: [id number]  Arrival date and time: 11/19/13 2009   First Provider Initiated Contact with Patient 11/19/13 2133      Chief Complaint  Patient presents with  . Fever  . Hematemesis   HPI Ms. Blakelee Allington is a 23 y.o. [redacted]w[redacted]d at [redacted]w[redacted]d who presents to MAU today with complaint N/V and fever since yesterday. The patient states she has had N/V throughout the pregnancy and had been taking Unisom and B6. She states allergies to Phenergan and Zofran and does not like the way Compazine makes her feel. She has not been able to have any PO intake today including liquids. She states mild abdominal cramping rated at 2/10 that is often present just prior to emesis and relieved by emesis. She denies diarrhea, sick contacts, vaginal bleeding or abnormal discharge. She does endorse some mild nasal congestion, but denies sinus pressure or cough. She has had some sore throat which she attributes to vomiting. The patient was recently diagnosed with IUFD of twin B.   OB History   Grav Para Term Preterm Abortions TAB SAB Ect Mult Living   1               Past Medical History  Diagnosis Date  . Pyelonephritis     Past Surgical History  Procedure Laterality Date  . No past surgeries      Family History  Problem Relation Age of Onset  . Alcohol abuse Neg Hx     History  Substance Use Topics  . Smoking status: Never Smoker   . Smokeless tobacco: Never Used  . Alcohol Use: No    Allergies:  Allergies  Allergen Reactions  . Ondansetron Hives and Itching  . Promethazine Hives and Itching    Prescriptions prior to admission  Medication Sig Dispense Refill  . acetaminophen (TYLENOL) 500 MG tablet Take 500 mg by mouth every 6 (six) hours as needed for fever.       . Prenatal Vit-Fe Fumarate-FA (PRENATAL MULTIVITAMIN) TABS tablet Take 1 tablet by mouth at bedtime.       . prochlorperazine (COMPAZINE) 10 MG tablet Take 1 tablet (10 mg total) by mouth every 8 (eight) hours as needed  for nausea or vomiting.  30 tablet  0    Review of Systems  Constitutional: Positive for malaise/fatigue. Negative for fever.  Gastrointestinal: Positive for nausea, vomiting and abdominal pain. Negative for diarrhea and constipation.  Genitourinary: Negative for dysuria, urgency and frequency.       Neg - vaginal bleeding, discharge   Physical Exam   Blood pressure 118/76, pulse 119, temperature 99.2 F (37.3 C), resp. rate 18, height 4\' 9"  (1.448 m), weight 84 lb 9.6 oz (38.374 kg), last menstrual period 09/14/2013, SpO2 100.00%.  Physical Exam  Constitutional: She is oriented to person, place, and time. She appears well-developed and well-nourished. No distress.  HENT:  Head: Normocephalic and atraumatic.  Cardiovascular: Normal rate.   GI: Soft. Bowel sounds are normal. She exhibits no distension and no mass. There is tenderness (mild diffuse abdominal tenderness to palpation). There is no rebound and no guarding.  Neurological: She is alert and oriented to person, place, and time.  Skin: Skin is warm and dry. No erythema.  Psychiatric: She has a normal mood and affect.   Results for orders placed during the hospital encounter of 11/19/13 (from the past 24 hour(s))  URINALYSIS, ROUTINE W REFLEX MICROSCOPIC     Status: Abnormal   Collection Time  11/19/13  8:35 PM      Result Value Ref Range   Color, Urine YELLOW  YELLOW   APPearance CLEAR  CLEAR   Specific Gravity, Urine 1.010  1.005 - 1.030   pH 8.0  5.0 - 8.0   Glucose, UA NEGATIVE  NEGATIVE mg/dL   Hgb urine dipstick NEGATIVE  NEGATIVE   Bilirubin Urine NEGATIVE  NEGATIVE   Ketones, ur >80 (*) NEGATIVE mg/dL   Protein, ur NEGATIVE  NEGATIVE mg/dL   Urobilinogen, UA 0.2  0.0 - 1.0 mg/dL   Nitrite NEGATIVE  NEGATIVE   Leukocytes, UA NEGATIVE  NEGATIVE  CBC     Status: Abnormal   Collection Time    11/19/13  9:50 PM      Result Value Ref Range   WBC 7.0  4.0 - 10.5 K/uL   RBC 3.07 (*) 3.87 - 5.11 MIL/uL    Hemoglobin 9.8 (*) 12.0 - 15.0 g/dL   HCT 28.0 (*) 36.0 - 46.0 %   MCV 91.2  78.0 - 100.0 fL   MCH 31.9  26.0 - 34.0 pg   MCHC 35.0  30.0 - 36.0 g/dL   RDW 11.6  11.5 - 15.5 %   Platelets 165  150 - 400 K/uL  COMPREHENSIVE METABOLIC PANEL     Status: Abnormal   Collection Time    11/19/13  9:50 PM      Result Value Ref Range   Sodium 134 (*) 137 - 147 mEq/L   Potassium 4.1  3.7 - 5.3 mEq/L   Chloride 97  96 - 112 mEq/L   CO2 22  19 - 32 mEq/L   Glucose, Bld 79  70 - 99 mg/dL   BUN 3 (*) 6 - 23 mg/dL   Creatinine, Ser 0.38 (*) 0.50 - 1.10 mg/dL   Calcium 8.8  8.4 - 10.5 mg/dL   Total Protein 6.3  6.0 - 8.3 g/dL   Albumin 3.5  3.5 - 5.2 g/dL   AST 24  0 - 37 U/L   ALT 28  0 - 35 U/L   Alkaline Phosphatase 35 (*) 39 - 117 U/L   Total Bilirubin 0.4  0.3 - 1.2 mg/dL   GFR calc non Af Amer >90  >90 mL/min   GFR calc Af Amer >90  >90 mL/min    MAU Course  Procedures None  MDM UA today shows significant dehydration Temperature upon arrival is 100.6 F IV LR bolus given with 10 mg Reglan for nausea Fever resolved. Temp now is 99.67F. Patient reports improvement in symptoms Influenza PCR swab obtained.  PO challenge. Patient able to tolerate PO in MAU today without emesis Discussed patient with Dr. Elly Modena. [place] for discharge with Rx for Reglan PRN N/V. Patient can take Tylenol PRN for fever or pain. Return to MAU if fever returns.   Assessment and Plan  A: Fever Nausea and vomiting in pregnancy prior to [redacted] weeks gestation  P: Discharge home Rx for Reglan given to patient Patient advised to take Tylenol PRN for pain or fever Patient advised to keep appointment in [place] to start prenatal care Patient to return to MAU as needed or if she develops fever, severe pain or abnormal discharge  Farris Has, PA-C  11/19/2013, 11:24 PM

## 2013-11-19 NOTE — MAU Note (Signed)
Up to BR. Crackers to pt

## 2013-11-19 NOTE — MAU Note (Signed)
Has sneezing and runny nose. Can't keep anything down and vomit has blood in it. Fever today.

## 2013-11-20 DIAGNOSIS — R509 Fever, unspecified: Secondary | ICD-10-CM

## 2013-11-20 DIAGNOSIS — O210 Mild hyperemesis gravidarum: Secondary | ICD-10-CM | POA: Diagnosis not present

## 2013-11-20 LAB — INFLUENZA PANEL BY PCR (TYPE A & B)
H1N1 flu by pcr: NOT DETECTED
INFLAPCR: NEGATIVE
Influenza B By PCR: NEGATIVE

## 2013-11-20 MED ORDER — METOCLOPRAMIDE HCL 10 MG PO TABS: 10.0000 mg | ORAL_TABLET | Freq: Four times a day (QID) | ORAL | Status: DC | PRN

## 2013-11-20 NOTE — MAU Provider Note (Signed)
Attestation of Attending Supervision of Advanced Practitioner (CNM/NP): Evaluation and management procedures were performed by the Advanced Practitioner under my supervision and collaboration.  I have reviewed the Advanced Practitioner's note and chart, and I agree with the management and plan.  Jazz Biddy 11/20/2013 7:44 AM

## 2013-11-20 NOTE — Discharge Instructions (Signed)
Morning Sickness Morning sickness is when you feel sick to your stomach (nauseous) during pregnancy. You may feel sick to your stomach and throw up (vomit). You may feel sick in the morning, but you can feel this way any time of day. Some women feel very sick to their stomach and cannot stop throwing up (hyperemesis gravidarum). HOME CARE  Only take medicines as told by your doctor.  Take multivitamins as told by your doctor. Taking multivitamins before getting pregnant can stop or lessen the harshness of morning sickness.  Eat dry toast or unsalted crackers before getting out of bed.  Eat 5 to 6 small meals a day.  Eat dry and bland foods like rice and baked potatoes.  Do not drink liquids with meals. Drink between meals.  Do not eat greasy, fatty, or spicy foods.  Have someone cook for you if the smell of food causes you to feel sick or throw up.  If you feel sick to your stomach after taking prenatal vitamins, take them at night or with a snack.  Eat protein when you need a snack (nuts, yogurt, cheese).  Eat unsweetened gelatins for dessert.  Wear a bracelet used for sea sickness (acupressure wristband).  Go to a doctor that puts thin needles into certain body points (acupuncture) to improve how you feel.  Do not smoke.  Use a humidifier to keep the air in your house free of odors.  Get lots of fresh air. GET HELP IF:  You need medicine to feel better.  You feel dizzy or lightheaded.  You are losing weight. GET HELP RIGHT AWAY IF:   You feel very sick to your stomach and cannot stop throwing up.  You pass out (faint). Document Released: 08/15/2004 Document Revised: 03/10/2013 Document Reviewed: 12/23/2012 [facility] Patient Information 2014 [place].   Tylenol 1000 mg every 8 hours AS NEEDED ONLY for fever or pain Return to MAU immediately if fever returns greater than 100.5 F Return to MAU immediately if you develop severe abdominal pain, vaginal bleeding  or abnormal vaginal discharge or leaking fluid

## 2013-11-20 NOTE — Progress Notes (Signed)
Rozelle Logan PA in earlier to discuss d/c plan. Written and verbal d/c instructions given and understanding voiced.

## 2013-11-25 ENCOUNTER — Encounter (HOSPITAL_COMMUNITY): Payer: Self-pay | Admitting: *Deleted

## 2013-11-25 ENCOUNTER — Inpatient Hospital Stay (HOSPITAL_COMMUNITY)
Admission: AD | Admit: 2013-11-25 | Discharge: 2013-11-25 | Disposition: A | Discharge status: Home / Self Care | Payer: Medicaid Other | Source: Ambulatory Visit | Attending: Obstetrics and Gynecology | Admitting: Obstetrics and Gynecology

## 2013-11-25 DIAGNOSIS — R109 Unspecified abdominal pain: Secondary | ICD-10-CM

## 2013-11-25 DIAGNOSIS — O26899 Other specified pregnancy related conditions, unspecified trimester: Secondary | ICD-10-CM

## 2013-11-25 DIAGNOSIS — O30049 Twin pregnancy, dichorionic/diamniotic, unspecified trimester: Secondary | ICD-10-CM | POA: Insufficient documentation

## 2013-11-25 DIAGNOSIS — O30009 Twin pregnancy, unspecified number of placenta and unspecified number of amniotic sacs, unspecified trimester: Secondary | ICD-10-CM | POA: Insufficient documentation

## 2013-11-25 DIAGNOSIS — O99891 Other specified diseases and conditions complicating pregnancy: Secondary | ICD-10-CM | POA: Insufficient documentation

## 2013-11-25 DIAGNOSIS — O9989 Other specified diseases and conditions complicating pregnancy, childbirth and the puerperium: Principal | ICD-10-CM

## 2013-11-25 LAB — OB RESULTS CONSOLE GC/CHLAMYDIA
Chlamydia: NEGATIVE
Gonorrhea: NEGATIVE

## 2013-11-25 LAB — URINALYSIS, ROUTINE W REFLEX MICROSCOPIC
Bilirubin Urine: NEGATIVE
Glucose, UA: NEGATIVE mg/dL
HGB URINE DIPSTICK: NEGATIVE
Ketones, ur: NEGATIVE mg/dL
LEUKOCYTES UA: NEGATIVE
Nitrite: NEGATIVE
PH: 6 (ref 5.0–8.0)
Protein, ur: NEGATIVE mg/dL
Specific Gravity, Urine: <1.005 — ABNORMAL LOW (ref 1.005–1.030)
Urobilinogen, UA: 0.2 mg/dL (ref 0.0–1.0)

## 2013-11-25 LAB — WET PREP, GENITAL
Clue Cells Wet Prep HPF POC: NONE SEEN
TRICH WET PREP: NONE SEEN
Yeast Wet Prep HPF POC: NONE SEEN

## 2013-11-25 MED ORDER — HYDROCODONE-ACETAMINOPHEN 5-325 MG PO TABS
1.0000 | ORAL_TABLET | Freq: Four times a day (QID) | ORAL | Status: DC | PRN
Start: 2013-11-25 — End: 2013-12-10

## 2013-11-25 NOTE — Discharge Instructions (Signed)
Abdominal Pain During Pregnancy Abdominal pain is common in pregnancy. Most of the time, it does not cause harm. There are many causes of abdominal pain. Some causes are more serious than others. Some of the causes of abdominal pain in pregnancy are easily diagnosed. Occasionally, the diagnosis takes time to understand. Other times, the cause is not determined. Abdominal pain can be a sign that something is very wrong with the pregnancy, or the pain may have nothing to do with the pregnancy at all. For this reason, always tell your health care provider if you have any abdominal discomfort. HOME CARE INSTRUCTIONS  Monitor your abdominal pain for any changes. The following actions may help to alleviate any discomfort you are experiencing:  Do not have sexual intercourse or put anything in your vagina until your symptoms go away completely.  Get plenty of rest until your pain improves.  Drink clear fluids if you feel nauseous. Avoid solid food as long as you are uncomfortable or nauseous.  Only take over-the-counter or prescription medicine as directed by your health care provider.  Keep all follow-up appointments with your health care provider. SEEK IMMEDIATE MEDICAL CARE IF:  You are bleeding, leaking fluid, or passing tissue from the vagina.  You have increasing pain or cramping.  You have persistent vomiting.  You have painful or bloody urination.  You have a fever.  You notice a decrease in your baby's movements.  You have extreme weakness or feel faint.  You have shortness of breath, with or without abdominal pain.  You develop a severe headache with abdominal pain.  You have abnormal vaginal discharge with abdominal pain.  You have persistent diarrhea.  You have abdominal pain that continues even after rest, or gets worse. MAKE SURE YOU:   Understand these instructions.  Will watch your condition.  Will get help right away if you are not doing well or get  worse. Document Released: 07/08/2005 Document Revised: 04/28/2013 Document Reviewed: 02/04/2013 [facility] Patient Information 2014 [place], [place].  Round Ligament Pain During Pregnancy Round ligament pain is a sharp pain or jabbing feeling often felt in the lower belly or groin area on one or both sides. It is one of the most common complaints during pregnancy and is considered a normal part of pregnancy. It is most often felt during the second trimester.  Here is what you need to know about round ligament pain, including some tips to help you feel better.  Causes of Round Ligament Pain  Several thick ligaments surround and support your womb (uterus) as it grows during pregnancy. One of them is called the round ligament.  The round ligament connects the front part of the womb to your groin, the area where your legs attach to your pelvis. The round ligament normally tightens and relaxes slowly.  As your baby and womb grow, the round ligament stretches. That makes it more likely to become strained.  Sudden movements can cause the ligament to tighten quickly, like a rubber band snapping. This causes a sudden and quick jabbing feeling.  Symptoms of Round Ligament Pain  Round ligament pain can be concerning and uncomfortable. But it is considered normal as your body changes during pregnancy.  The symptoms of round ligament pain include a sharp, sudden spasm in the belly. It usually affects the right side, but it may happen on both sides. The pain only lasts a few seconds.  Exercise may cause the pain, as will rapid movements such as:  sneezing coughing laughing rolling  over in bed standing up too quickly  Treatment of Round Ligament Pain  Here are some tips that may help reduce your discomfort:  Pain relief. Take over-the-counter acetaminophen for pain, if necessary. Ask your doctor if this is OK.  Exercise. Get plenty of exercise to keep your stomach (core) muscles strong. Doing  stretching exercises or prenatal yoga can be helpful. Ask your doctor which exercises are safe for you and your baby.  A helpful exercise involves putting your hands and knees on the floor, lowering your head, and pushing your backside into the air.  Avoid sudden movements. Change positions slowly (such as standing up or sitting down) to avoid sudden movements that may cause stretching and pain.  Flex your hips. Bend and flex your hips before you cough, sneeze, or laugh to avoid pulling on the ligaments.  Apply warmth. A heating pad or warm bath may be helpful. Ask your doctor if this is OK. Extreme heat can be dangerous to the baby.  You should try to modify your daily activity level and avoid positions that may worsen the condition.  When to Call the Doctor/Midwife  Always tell your doctor or midwife about any type of pain you have during pregnancy. Round ligament pain is quick and doesn't last long.  Call your health care provider immediately if you have:  severe pain fever chills pain on urination difficulty walking  Belly pain during pregnancy can be due to many different causes. It is important for your doctor to rule out more serious conditions, including pregnancy complications such as placenta abruption or non-pregnancy illnesses such as:  inguinal hernia appendicitis stomach, liver, and kidney problems Preterm labor pains may sometimes be mistaken for round ligament pain.

## 2013-11-25 NOTE — MAU Provider Note (Signed)
Chief Complaint: No chief complaint on file.   First Provider Initiated Contact with Patient 11/25/13 2021     SUBJECTIVE HPI: Shelly Koch is a 23 y.o. G1P0 at [redacted]w[redacted]d by LMP who presents to maternity admissions reporting abdominal pain described as sharp cramping pain, more on the right but occuring on both sides.  She has been seen in MAU for abdominal pain and was diagnosed on 4/28 with di/di twin pregnancy with 1 101w0d living IUP twin A and 1 demise of earlier gestation for Twin B. She denies vaginal bleeding, vaginal itching/burning, urinary symptoms, h/a, dizziness, n/v, or fever/chills.     Past Medical History  Diagnosis Date  . Pyelonephritis    Past Surgical History  Procedure Laterality Date  . No past surgeries     History   Social History  . Marital Status: Single    Spouse Name: N/A    Number of Children: N/A  . Years of Education: N/A   Occupational History  . Not on file.   Social History Main Topics  . Smoking status: Never Smoker   . Smokeless tobacco: Never Used  . Alcohol Use: No  . Drug Use: No  . Sexual Activity: Yes    Partners: Male    Birth Control/ Protection: None     Comment: Stopped Depo in November; trying for pregnancy   Other Topics Concern  . Not on file   Social History Narrative  . No narrative on file   No current facility-administered medications on file prior to encounter.   Current Outpatient Prescriptions on File Prior to Encounter  Medication Sig Dispense Refill  . acetaminophen (TYLENOL) 500 MG tablet Take 500 mg by mouth every 6 (six) hours as needed for fever.       . metoCLOPramide (REGLAN) 10 MG tablet Take 1 tablet (10 mg total) by mouth every 6 (six) hours as needed for nausea.  30 tablet  1  . Prenatal Vit-Fe Fumarate-FA (PRENATAL MULTIVITAMIN) TABS tablet Take 1 tablet by mouth at bedtime.        Allergies  Allergen Reactions  . Ondansetron Hives and Itching  . Promethazine Hives and Itching    ROS: Pertinent  items in HPI  OBJECTIVE Blood pressure 111/70, pulse 74, temperature 98.8 F (37.1 C), temperature source Oral, resp. rate 18, height 4\' 8"  (1.422 m), weight 37.649 kg (83 lb), last menstrual period 09/14/2013. GENERAL: Well-developed, well-nourished female in no acute distress.  HEENT: Normocephalic HEART: normal rate RESP: normal effort ABDOMEN: Soft, non-tender EXTREMITIES: Nontender, no edema NEURO: Alert and oriented Pelvic exam: Cervix pink, visually closed, nonfriable, without lesion, scant tan discharge, vaginal walls and external genitalia normal Bimanual exam: Cervix 0/long/high, firm, anterior, neg CMT, uterus nontender, ~10 week size, adnexa with bilateral tenderness, no enlargement, or mass bilaterally  FHT 170s by doppler  LAB RESULTS Results for orders placed during the hospital encounter of 11/25/13 (from the past 24 hour(s))  URINALYSIS, ROUTINE W REFLEX MICROSCOPIC     Status: Abnormal   Collection Time    11/25/13  6:40 PM      Result Value Ref Range   Color, Urine YELLOW  YELLOW   APPearance CLEAR  CLEAR   Specific Gravity, Urine <1.005 (*) 1.005 - 1.030   pH 6.0  5.0 - 8.0   Glucose, UA NEGATIVE  NEGATIVE mg/dL   Hgb urine dipstick NEGATIVE  NEGATIVE   Bilirubin Urine NEGATIVE  NEGATIVE   Ketones, ur NEGATIVE  NEGATIVE mg/dL   Protein,  ur NEGATIVE  NEGATIVE mg/dL   Urobilinogen, UA 0.2  0.0 - 1.0 mg/dL   Nitrite NEGATIVE  NEGATIVE   Leukocytes, UA NEGATIVE  NEGATIVE  WET PREP, GENITAL     Status: Abnormal   Collection Time    11/25/13  8:35 PM      Result Value Ref Range   Yeast Wet Prep HPF POC NONE SEEN  NONE SEEN   Trich, Wet Prep NONE SEEN  NONE SEEN   Clue Cells Wet Prep HPF POC NONE SEEN  NONE SEEN   WBC, Wet Prep HPF POC FEW (*) NONE SEEN   Wet prep pending  IMAGING US Ob Transvaginal  11/16/2013   CLINICAL DATA:  Followup viability.  Twins.  EXAM: US OB TRANSVAGINAL  COMPARISON:  11/04/2013  FINDINGS: TWIN 1  Intrauterine gestational sac:  Visualized/normal in shape.  Yolk sac:  Visualized  Embryo:  Visualized  Cardiac Activity: Visualized  Heart Rate: 172 bpm  MSD:   mm    w     d  CRL:  22.8  mm   9 w 0 d                  [place] EDC: 06/21/2014  TWIN 2  Intrauterine gestational sac: Irregular, lenticular in shape.  Yolk sac:  Not visualized  Embryo:  Visualized  Cardiac Activity: Not visualized  MSD:   mm    w     d  CRL:  7.4  mm   6 w 5 d  Maternal uterus/adnexae: No free fluid. No subchorionic hemorrhage. No adnexal masses. Ovaries symmetric in size and echotexture.  IMPRESSION: Intrauterine fetal demise of twin B. No fetal heart tones detectable. Crown-rump length of twin B has decreased.  Twin A: 9 week 0 day by crown-rump length. Fetal heart rate 170 beats per min.   Electronically Signed   By: Rolm Baptise M.D.   On: 11/16/2013 16:08   ASSESSMENT 1. Abdominal pain complicating pregnancy    Report to Marcille Buffy, CNM    Medication List         acetaminophen 500 MG tablet  Commonly known as:  TYLENOL  Take 500 mg by mouth every 6 (six) hours as needed for fever.     HYDROcodone-acetaminophen 5-325 MG per tablet  Commonly known as:  NORCO/VICODIN  Take 1 tablet by mouth every 6 (six) hours as needed for moderate pain.     metoCLOPramide 10 MG tablet  Commonly known as:  REGLAN  Take 1 tablet (10 mg total) by mouth every 6 (six) hours as needed for nausea.     prenatal multivitamin Tabs tablet  Take 1 tablet by mouth at bedtime.       Follow-up Information   Follow up with [facility]. (On 12/10/13 as scheduled. Return to MAU as needed for emergencies)    Specialty:  Obstetrics and Gynecology   Contact information:   [place] [address] [phone]      Follow up with [facility]. (As scheduled)    Specialty:  Obstetrics and Gynecology   Contact information:   [place] [address] [place] Certified  Nurse-Midwife 11/25/2013  9:23 PM

## 2013-11-25 NOTE — MAU Note (Signed)
PT SAYS SHE HAD TWINS BUT  I DIED  ON 4-28-  SAW ON U/S IN MAU.   SAYS  HAS BEEN HERE  FOR ABD PAIN X2  BUT STILL HAS  ABD PAIN.   SAYS HURTS WHEN VOIDS-  BURNS-  SAW TINY SPOT OF BLOOD  ON TOILET PAPER.      TOOK  MED FOR NAUSEA-   WITH RELIEF.

## 2013-11-26 LAB — GC/CHLAMYDIA PROBE AMP
CT PROBE, AMP APTIMA: NEGATIVE
GC Probe RNA: NEGATIVE

## 2013-11-26 NOTE — MAU Provider Note (Signed)
Attestation of Attending Supervision of Advanced Practitioner (CNM/NP): Evaluation and management procedures were performed by the Advanced Practitioner under my supervision and collaboration.  I have reviewed the Advanced Practitioner's note and chart, and I agree with the management and plan.  Jaydrian Corpening 11/26/2013 6:09 AM

## 2013-12-07 ENCOUNTER — Encounter (HOSPITAL_COMMUNITY): Payer: Self-pay | Admitting: Emergency Medicine

## 2013-12-07 ENCOUNTER — Emergency Department (INDEPENDENT_AMBULATORY_CARE_PROVIDER_SITE_OTHER)
Admission: EM | Admit: 2013-12-07 | Discharge: 2013-12-07 | Disposition: A | Discharge status: Home / Self Care | Payer: BC Managed Care – PPO | Source: Home / Self Care | Attending: Family Medicine | Admitting: Family Medicine

## 2013-12-07 ENCOUNTER — Ambulatory Visit (HOSPITAL_COMMUNITY)
Admit: 2013-12-07 | Discharge: 2013-12-07 | Disposition: A | Discharge status: Home / Self Care | Payer: BC Managed Care – PPO | Attending: Family Medicine | Admitting: Family Medicine

## 2013-12-07 DIAGNOSIS — M79609 Pain in unspecified limb: Secondary | ICD-10-CM | POA: Insufficient documentation

## 2013-12-07 DIAGNOSIS — M62831 Muscle spasm of calf: Secondary | ICD-10-CM

## 2013-12-07 DIAGNOSIS — M62838 Other muscle spasm: Secondary | ICD-10-CM

## 2013-12-07 DIAGNOSIS — O99891 Other specified diseases and conditions complicating pregnancy: Secondary | ICD-10-CM | POA: Diagnosis present

## 2013-12-07 DIAGNOSIS — O9989 Other specified diseases and conditions complicating pregnancy, childbirth and the puerperium: Principal | ICD-10-CM

## 2013-12-07 NOTE — Discharge Instructions (Signed)
Heat to leg, stretch , ace wrap and tylenol for soreness as needed.

## 2013-12-07 NOTE — ED Notes (Signed)
Pt  Has  Pain r  Calf   X   2  Days  denys  Any  Injury  - pt  Is   3  Months  Pregnant       denys  Any      Chest  Pain  And  Or   Shortness  Of  Breath

## 2013-12-07 NOTE — ED Notes (Signed)
Pt   Sent  To       Vascular  Lab  For  Venous  doppler

## 2013-12-07 NOTE — Progress Notes (Signed)
Right lower extremity venous duplex completed.  Right:  No evidence of DVT, superficial thrombosis, or Baker's cyst.  Left:  Negative for DVT in the common femoral vein.  

## 2013-12-07 NOTE — ED Provider Notes (Signed)
CSN: [id number]     Arrival date & time 12/07/13  1259 History   First MD Initiated Contact with Patient 12/07/13 1446     Chief Complaint  Patient presents with  . Leg Pain   (Consider location/radiation/quality/duration/timing/severity/associated sxs/prior Treatment) Patient is a 23 y.o. female presenting with leg pain. The history is provided by the patient.  Leg Pain Location:  Leg Time since incident:  4 days Injury: no   Pain details:    Quality:  Aching and throbbing   Severity:  Mild   Onset quality:  Gradual Chronicity:  New Dislocation: no   Prior injury to area:  No Associated symptoms: swelling   Associated symptoms: no back pain   Risk factors comment:  21mo pregnant.   Past Medical History  Diagnosis Date  . Pyelonephritis    Past Surgical History  Procedure Laterality Date  . No past surgeries     Family History  Problem Relation Age of Onset  . Alcohol abuse Neg Hx    History  Substance Use Topics  . Smoking status: Never Smoker   . Smokeless tobacco: Never Used  . Alcohol Use: No   OB History   Grav Para Term Preterm Abortions TAB SAB Ect Mult Living   1              Review of Systems  Unable to perform ROS Constitutional: Negative.   Musculoskeletal: Positive for gait problem and myalgias. Negative for back pain.  Skin: Negative.     Allergies  Ondansetron and Promethazine  Home Medications   Prior to Admission medications   Medication Sig Start Date End Date Taking? Authorizing Provider  acetaminophen (TYLENOL) 500 MG tablet Take 500 mg by mouth every 6 (six) hours as needed for fever.     Historical Provider, MD  HYDROcodone-acetaminophen (NORCO/VICODIN) 5-325 MG per tablet Take 1 tablet by mouth every 6 (six) hours as needed for moderate pain. 11/25/13   Heather Erby Pian, CNM  metoCLOPramide (REGLAN) 10 MG tablet Take 1 tablet (10 mg total) by mouth every 6 (six) hours as needed for nausea. 11/20/13   Farris Has, PA-C  Prenatal  Vit-Fe Fumarate-FA (PRENATAL MULTIVITAMIN) TABS tablet Take 1 tablet by mouth at bedtime.     Historical Provider, MD   BP 119/74  Pulse 82  Temp(Src) 98.1 F (36.7 C) (Oral)  Resp 14  SpO2 100%  LMP 09/14/2013 Physical Exam  Nursing note and vitals reviewed. Constitutional: She is oriented to person, place, and time. She appears well-developed and well-nourished.  Musculoskeletal: She exhibits tenderness.       Right lower leg: She exhibits tenderness and swelling.       Legs: Neurological: She is alert and oriented to person, place, and time.  Skin: Skin is warm and dry. No rash noted.    ED Course  Procedures (including critical care time) Labs Review Labs Reviewed - No data to display  Imaging Review No results found. Venous duplex neg for dvt.  MDM   1. Muscle spasm of calf        Billy Fischer, MD 12/07/13 [phone]

## 2013-12-10 ENCOUNTER — Ambulatory Visit (INDEPENDENT_AMBULATORY_CARE_PROVIDER_SITE_OTHER): Payer: Medicaid Other | Admitting: Obstetrics and Gynecology

## 2013-12-10 ENCOUNTER — Encounter: Payer: Self-pay | Admitting: Obstetrics and Gynecology

## 2013-12-10 ENCOUNTER — Other Ambulatory Visit (HOSPITAL_COMMUNITY)
Admission: RE | Admit: 2013-12-10 | Discharge: 2013-12-10 | Disposition: A | Discharge status: Home / Self Care | Payer: Medicaid Other | Source: Ambulatory Visit | Attending: Obstetrics and Gynecology | Admitting: Obstetrics and Gynecology

## 2013-12-10 ENCOUNTER — Encounter: Payer: Self-pay | Admitting: Obstetrics & Gynecology

## 2013-12-10 VITALS — BP 112/77 | HR 101 | Temp 97.6°F | Wt 82.8 lb

## 2013-12-10 DIAGNOSIS — O0990 Supervision of high risk pregnancy, unspecified, unspecified trimester: Secondary | ICD-10-CM | POA: Insufficient documentation

## 2013-12-10 DIAGNOSIS — Z3490 Encounter for supervision of normal pregnancy, unspecified, unspecified trimester: Secondary | ICD-10-CM

## 2013-12-10 DIAGNOSIS — E639 Nutritional deficiency, unspecified: Secondary | ICD-10-CM | POA: Insufficient documentation

## 2013-12-10 DIAGNOSIS — D649 Anemia, unspecified: Secondary | ICD-10-CM

## 2013-12-10 DIAGNOSIS — Z113 Encounter for screening for infections with a predominantly sexual mode of transmission: Secondary | ICD-10-CM | POA: Insufficient documentation

## 2013-12-10 DIAGNOSIS — Z01419 Encounter for gynecological examination (general) (routine) without abnormal findings: Secondary | ICD-10-CM | POA: Insufficient documentation

## 2013-12-10 DIAGNOSIS — Z3400 Encounter for supervision of normal first pregnancy, unspecified trimester: Secondary | ICD-10-CM

## 2013-12-10 DIAGNOSIS — Z3401 Encounter for supervision of normal first pregnancy, first trimester: Secondary | ICD-10-CM

## 2013-12-10 DIAGNOSIS — Z3480 Encounter for supervision of other normal pregnancy, unspecified trimester: Secondary | ICD-10-CM

## 2013-12-10 LAB — POCT URINALYSIS DIP (DEVICE)
Bilirubin Urine: NEGATIVE
Glucose, UA: NEGATIVE mg/dL
HGB URINE DIPSTICK: NEGATIVE
Ketones, ur: NEGATIVE mg/dL
Leukocytes, UA: NEGATIVE
Nitrite: NEGATIVE
PROTEIN: NEGATIVE mg/dL
Specific Gravity, Urine: 1.02 (ref 1.005–1.030)
UROBILINOGEN UA: 0.2 mg/dL (ref 0.0–1.0)
pH: 7.5 (ref 5.0–8.0)

## 2013-12-10 NOTE — Progress Notes (Signed)
   Subjective:    Shelly Koch is a G1P0 [redacted]w[redacted]d being seen today for her first obstetrical visit.  Her obstetrical history is significant for undernutrition. Patient does intend to breast feed. Pregnancy history fully reviewed. Seen @[facility]  for abd pain and had normal [place] at 6+wks. Seen [facility] for leg cramps> resolved.   Patient reports nausea improved, no recent vomiting. Eats well, no meal skipping. 4# TWL.  Filed Vitals:   12/10/13 0849  BP: 112/77  Pulse: 101  Temp: 97.6 F (36.4 C)  Weight: 82 lb 12.8 oz (37.558 kg)    HISTORY: OB History  Gravida Para Term Preterm AB SAB TAB Ectopic Multiple Living  1             # Outcome Date GA Lbr Len/2nd Weight Sex Delivery Anes PTL Lv  1 CUR              Past Medical History  Diagnosis Date  . Pyelonephritis    Past Surgical History  Procedure Laterality Date  . No past surgeries     Family History  Problem Relation Age of Onset  . Alcohol abuse Neg Hx      Exam    Uterus:     Pelvic Exam:    Perineum: No Hemorrhoids, Normal Perineum   Vulva: normal, Bartholin's, Urethra, Skene's normal   Vagina:  normal mucosa, normal discharge       Cervix: no cervical motion tenderness and nulliparous appearance   Adnexa: not evaluated   Bony Pelvis: average  System: Breast:  normal appearance, no masses or tenderness   Skin: normal coloration and turgor, no rashes    Neurologic: oriented, normal, grossly non-focal   Extremities: normal strength, tone, and muscle mass, no deformities   HEENT PERRLA and extra ocular movement intact   Mouth/Teeth mucous membranes moist, pharynx normal without lesions and dental hygiene good   Neck supple and no masses   Cardiovascular: regular rate and rhythm, no murmurs or gallops   Respiratory:  appears well, vitals normal, no respiratory distress, acyanotic, normal RR, ear and throat exam is normal, neck free of mass or lymphadenopathy, chest clear, no wheezing, crepitations, rhonchi,  normal symmetric air entry   Abdomen: soft, non-tender; bowel sounds normal; no masses,  no organomegaly x fundus 1/2 to U. FHR 160s   Urinary: urethral meatus normal      Assessment:    Pregnancy: G1P0 Patient Active Problem List   Diagnosis Date Noted  . Supervision of normal first pregnancy in first trimester 12/10/2013  . Undernutrition 12/10/2013        Plan:     Initial labs drawn. Prenatal vitamins. Problem list reviewed and updated. Genetic Screening discussed Quad Screen: requested.  Ultrasound discussed; fetal survey: requested.  Follow up in 4 weeks. 50% of 30 min visit spent on counseling and coordination of care.  Reviewed diet. See nutritionist.   Lorene Dy 12/10/2013

## 2013-12-10 NOTE — Progress Notes (Signed)
C/o pelvic pain sometimes. C/o brown vaginal discharge occasionally - maybe once every week or two.

## 2013-12-10 NOTE — Addendum Note (Signed)
Addended by: Shelly Coss on: 12/10/2013 11:27 AM   Modules accepted: Orders

## 2013-12-10 NOTE — Patient Instructions (Signed)
Pregnancy - First Trimester During sexual intercourse, millions of sperm go into the vagina. Only 1 sperm will penetrate and fertilize the female egg while it is in the Fallopian tube. One week later, the fertilized egg implants into the wall of the uterus. An embryo begins to develop into a baby. At 6 to 8 weeks, the eyes and face are formed and the heartbeat can be seen on ultrasound. At the end of 12 weeks (first trimester), all the baby's organs are formed. Now that you are pregnant, you will want to do everything you can to have a healthy baby. Two of the most important things are to get good prenatal care and follow your caregiver's instructions. Prenatal care is all the medical care you receive before the baby's birth. It is given to prevent, find, and treat problems during the pregnancy and childbirth. PRENATAL EXAMS  During prenatal visits, your weight, blood pressure, and urine are checked. This is done to make sure you are healthy and progressing normally during the pregnancy.  A pregnant woman should gain 25 to 35 pounds during the pregnancy. However, if you are overweight or underweight, your caregiver will advise you regarding your weight.  Your caregiver will ask and answer questions for you.  Blood work, cervical cultures, other necessary tests, and a Pap test are done during your prenatal exams. These tests are done to check on your health and the probable health of your baby. Tests are strongly recommended and done for HIV with your permission. This is the virus that causes AIDS. These tests are done because medicines can be given to help prevent your baby from being born with this infection should you have been infected without knowing it. Blood work is also used to find out your blood type, previous infections, and follow your blood levels (hemoglobin).  Low hemoglobin (anemia) is common during pregnancy. Iron and vitamins are given to help prevent this. Later in the pregnancy, blood  tests for diabetes will be done along with any other tests if any problems develop.  You may need other tests to make sure you and the baby are doing well. CHANGES DURING THE FIRST TRIMESTER  Your body goes through many changes during pregnancy. They vary from person to person. Talk to your caregiver about changes you notice and are concerned about. Changes can include:  Your menstrual period stops.  The egg and sperm carry the genes that determine what you look like. Genes from you and your partner are forming a baby. The female genes determine whether the baby is a boy or a girl.  Your body increases in girth and you may feel bloated.  Feeling sick to your stomach (nauseous) and throwing up (vomiting). If the vomiting is uncontrollable, call your caregiver.  Your breasts will begin to enlarge and become tender.  Your nipples may stick out more and become darker.  The need to urinate more. Painful urination may mean you have a bladder infection.  Tiring easily.  Loss of appetite.  Cravings for certain kinds of food.  At first, you may gain or lose a couple of pounds.  You may have changes in your emotions from day to day (excited to be pregnant or concerned something may go wrong with the pregnancy and baby).  You may have more vivid and strange dreams. HOME CARE INSTRUCTIONS   It is very important to avoid all smoking, alcohol and non-prescribed drugs during your pregnancy. These affect the formation and growth of the baby.   Avoid chemicals while pregnant to ensure the delivery of a healthy infant.  Start your prenatal visits by the 12th week of pregnancy. They are usually scheduled monthly at first, then more often in the last 2 months before delivery. Keep your caregiver's appointments. Follow your caregiver's instructions regarding medicine use, blood and lab tests, exercise, and diet.  During pregnancy, you are providing food for you and your baby. Eat regular, well-balanced  meals. Choose foods such as meat, fish, milk and other low fat dairy products, vegetables, fruits, and whole-grain breads and cereals. Your caregiver will tell you of the ideal weight gain.  You can help morning sickness by keeping soda crackers at the bedside. Eat a couple before arising in the morning. You may want to use the crackers without salt on them.  Eating 4 to 5 small meals rather than 3 large meals a day also may help the nausea and vomiting.  Drinking liquids between meals instead of during meals also seems to help nausea and vomiting.  A physical sexual relationship may be continued throughout pregnancy if there are no other problems. Problems may be early (premature) leaking of amniotic fluid from the membranes, vaginal bleeding, or belly (abdominal) pain.  Exercise regularly if there are no restrictions. Check with your caregiver or physical therapist if you are unsure of the safety of some of your exercises. Greater weight gain will occur in the last 2 trimesters of pregnancy. Exercising will help:  Control your weight.  Keep you in shape.  Prepare you for labor and delivery.  Help you lose your pregnancy weight after you deliver your baby.  Wear a good support or jogging bra for breast tenderness during pregnancy. This may help if worn during sleep too.  Ask when prenatal classes are available. Begin classes when they are offered.  Do not use hot tubs, steam rooms, or saunas.  Wear your seat belt when driving. This protects you and your baby if you are in an accident.  Avoid raw meat, uncooked cheese, cat litter boxes, and soil used by cats throughout the pregnancy. These carry germs that can cause birth defects in the baby.  The first trimester is a good time to visit your dentist for your dental health. Getting your teeth cleaned is okay. Use a softer toothbrush and brush gently during pregnancy.  Ask for help if you have financial, counseling, or nutritional needs  during pregnancy. Your caregiver will be able to offer counseling for these needs as well as refer you for other special needs.  Do not take any medicines or herbs unless told by your caregiver.  Inform your caregiver if there is any mental or physical domestic violence.  Make a list of emergency phone numbers of family, friends, hospital, and police and fire departments.  Write down your questions. Take them to your prenatal visit.  Do not douche.  Do not cross your legs.  If you have to stand for long periods of time, rotate you feet or take small steps in a circle.  You may have more vaginal secretions that may require a sanitary pad. Do not use tampons or scented sanitary pads. MEDICINES AND DRUG USE IN PREGNANCY  Take prenatal vitamins as directed. The vitamin should contain 1 milligram of folic acid. Keep all vitamins out of reach of children. Only a couple vitamins or tablets containing iron may be fatal to a baby or young child when ingested.  Avoid use of all medicines, including herbs, over-the-counter medicines, not   prescribed or suggested by your caregiver. Only take over-the-counter or prescription medicines for pain, discomfort, or fever as directed by your caregiver. Do not use aspirin, ibuprofen, or naproxen unless directed by your caregiver.  Let your caregiver also know about herbs you may be using.  Alcohol is related to a number of birth defects. This includes fetal alcohol syndrome. All alcohol, in any form, should be avoided completely. Smoking will cause low birth rate and premature babies.  Street or illegal drugs are very harmful to the baby. They are absolutely forbidden. A baby born to an addicted mother will be addicted at birth. The baby will go through the same withdrawal an adult does.  Let your caregiver know about any medicines that you have to take and for what reason you take them. SEEK MEDICAL CARE IF:  You have any concerns or worries during your  pregnancy. It is better to call with your questions if you feel they cannot wait, rather than worry about them. SEEK IMMEDIATE MEDICAL CARE IF:   An unexplained oral temperature above 102 F (38.9 C) develops, or as your caregiver suggests.  You have leaking of fluid from the vagina (birth canal). If leaking membranes are suspected, take your temperature and inform your caregiver of this when you call.  There is vaginal spotting or bleeding. Notify your caregiver of the amount and how many pads are used.  You develop a bad smelling vaginal discharge with a change in the color.  You continue to feel sick to your stomach (nauseated) and have no relief from remedies suggested. You vomit blood or coffee ground-like materials.  You lose more than 2 pounds of weight in 1 week.  You gain more than 2 pounds of weight in 1 week and you notice swelling of your face, hands, feet, or legs.  You gain 5 pounds or more in 1 week (even if you do not have swelling of your hands, face, legs, or feet).  You get exposed to [place] measles and have never had them.  You are exposed to fifth disease or chickenpox.  You develop belly (abdominal) pain. Round ligament discomfort is a common non-cancerous (benign) cause of abdominal pain in pregnancy. Your caregiver still must evaluate this.  You develop headache, fever, diarrhea, pain with urination, or shortness of breath.  You fall or are in a car accident or have any kind of trauma.  There is mental or physical violence in your home. Document Released: 07/02/2001 Document Revised: 04/01/2012 Document Reviewed: 01/03/2009 [facility] Patient Information 2014 [place]. Nutrition for the New Mother  A new mother needs good health and nutrition so she can have energy to take care of a new baby. Whether a mother breastfeeds or formula feeds the baby, it is important to have a well-balanced diet. Foods from all the food groups should be chosen to meet the  new mother's energy needs and to give her the nutrients needed for repair and healing.  A HEALTHY EATING PLAN The My Pyramid plan for Moms outlines what you should eat to help you and your baby stay healthy. The energy and amount of food you need depends on whether or not you are breastfeeding. If you are breastfeeding you will need more nutrients. If you choose not to breastfeed, your nutrition goal should be to return to a healthy weight. Limiting calories may be needed if you are not breastfeeding.  HOME CARE INSTRUCTIONS   For a personal plan based on your unique needs, see  your Registered Dietitian or visit [email or web address]-lambert.[email or web address]  Eat a variety of foods. The plan below will help guide you. The following chart has a suggested daily meal plan from the My Pyramid for Moms.  Eat a variety of fruits and vegetables.  Eat more dark green and orange vegetables and cooked dried beans.  Make half your grains whole grains. Choose whole instead of refined grains.  Choose low-fat or lean meats and poultry.  Choose low-fat or fat-free dairy products like milk, cheese, or yogurt. Fruits  Breastfeeding: 2 cups  Non-Breastfeeding: 2 cups  What Counts as a serving?  1 cup of fruit or juice.   cup dried fruit. Vegetables  Breastfeeding: 3 cups  Non-Breastfeeding: 2  cups  What Counts as a serving?  1 cup raw or cooked vegetables.  Juice or 2 cups raw leafy vegetables. Grains  Breastfeeding: 8 oz  Non-Breastfeeding: 6 oz  What Counts as a serving?  1 slice bread.  1 oz ready-to-eat cereal.   cup cooked pasta, rice, or cereal. Meat and Beans  Breastfeeding: 6  oz  Non-Breastfeeding: 5  oz  What Counts as a serving?  1 oz lean meat, poultry, or fish   cup cooked dry beans   oz nuts or 1 egg  1 tbs peanut butter Milk  Breastfeeding: 3 cups  Non-Breastfeeding: 3 cups  What Counts as a serving?  1 cup milk.  8 oz yogurt.  1  oz cheese.  2 oz processed  cheese. TIPS FOR THE BREASTFEEDING MOM  Rapid weight loss is not suggested when you are breastfeeding. By simply breastfeeding, you will be able to lose the weight gained during your pregnancy. Your caregiver can keep track of your weight and tell you if your weight loss is appropriate.  Be sure to drink fluids. You may notice that you are thirstier than usual. A suggestion is to drink a glass of water or other beverage whenever you breastfeed.  Avoid alcohol as it can be passed into your breast milk.  Limit caffeine drinks to no more than 2 to 3 cups per day.  You may need to keep taking your prenatal vitamin while you are breastfeeding. Talk with your caregiver about taking a vitamin or supplement. RETURING TO A HEALTHY WEIGHT  The My Pyramid Plan for Moms will help you return to a healthy weight. It will also provide the nutrients you need.  You may need to limit "empty" calories. These include:  High fat foods like fried foods, fatty meats, fast food, butter, and mayonnaise.  High sugar foods like sodas, jelly, candy, and sweets.  Be physically active. Include 30 minutes of exercise or more each day. Choose an activity you like such as walking, swimming, biking, or aerobics. Check with your caregiver before you start to exercise. Document Released: 10/15/2007 Document Revised: 09/30/2011 Document Reviewed: 10/15/2007 [facility] Patient Information 2014 [place], [place].

## 2013-12-11 DIAGNOSIS — D649 Anemia, unspecified: Secondary | ICD-10-CM | POA: Insufficient documentation

## 2013-12-11 LAB — OBSTETRIC PANEL
ANTIBODY SCREEN: NEGATIVE
Basophils Absolute: 0 10*3/uL (ref 0.0–0.1)
Basophils Relative: 0 % (ref 0–1)
EOS PCT: 0 % (ref 0–5)
Eosinophils Absolute: 0 10*3/uL (ref 0.0–0.7)
HCT: 29.2 % — ABNORMAL LOW (ref 36.0–46.0)
Hemoglobin: 9.7 g/dL — ABNORMAL LOW (ref 12.0–15.0)
Hepatitis B Surface Ag: NEGATIVE
LYMPHS ABS: 2.5 10*3/uL (ref 0.7–4.0)
LYMPHS PCT: 30 % (ref 12–46)
MCH: 31 pg (ref 26.0–34.0)
MCHC: 33.2 g/dL (ref 30.0–36.0)
MCV: 93.3 fL (ref 78.0–100.0)
MONO ABS: 0.5 10*3/uL (ref 0.1–1.0)
Monocytes Relative: 6 % (ref 3–12)
Neutro Abs: 5.3 10*3/uL (ref 1.7–7.7)
Neutrophils Relative %: 64 % (ref 43–77)
PLATELETS: 176 10*3/uL (ref 150–400)
RBC: 3.13 MIL/uL — AB (ref 3.87–5.11)
RDW: 13.7 % (ref 11.5–15.5)
RH TYPE: POSITIVE
RUBELLA: 1.11 {index} — AB (ref <0.90)
WBC: 8.3 10*3/uL (ref 4.0–10.5)

## 2013-12-11 LAB — HIV ANTIBODY (ROUTINE TESTING W REFLEX): HIV: NONREACTIVE

## 2013-12-12 LAB — CULTURE, OB URINE: Colony Count: 2000

## 2013-12-14 ENCOUNTER — Encounter: Payer: Self-pay | Admitting: Obstetrics & Gynecology

## 2013-12-15 LAB — PRESCRIPTION MONITORING PROFILE (19 PANEL)
AMPHETAMINE/METH: NEGATIVE ng/mL
BARBITURATE SCREEN, URINE: NEGATIVE ng/mL
Benzodiazepine Screen, Urine: NEGATIVE ng/mL
Buprenorphine, Urine: NEGATIVE ng/mL
CREATININE, URINE: 61.61 mg/dL (ref >20.0)
Cannabinoid Scrn, Ur: NEGATIVE ng/mL
Carisoprodol, Urine: NEGATIVE ng/mL
Cocaine Metabolites: NEGATIVE ng/mL
Fentanyl, Ur: NEGATIVE ng/mL
MDMA URINE: NEGATIVE ng/mL
Meperidine, Ur: NEGATIVE ng/mL
Methadone Screen, Urine: NEGATIVE ng/mL
Methaqualone: NEGATIVE ng/mL
NITRITES URINE, INITIAL: NEGATIVE ug/mL
OPIATE SCREEN, URINE: NEGATIVE ng/mL
OXYCODONE SCRN UR: NEGATIVE ng/mL
PH URINE, INITIAL: 7.7 pH (ref 4.5–8.9)
PROPOXYPHENE: NEGATIVE ng/mL
Phencyclidine, Ur: NEGATIVE ng/mL
TAPENTADOLUR: NEGATIVE ng/mL
TRAMADOL UR: NEGATIVE ng/mL
Zolpidem, Urine: NEGATIVE ng/mL

## 2013-12-15 LAB — HEMOGLOBINOPATHY EVALUATION
HGB A: 96.7 % — AB (ref 96.8–97.8)
Hemoglobin Other: 0 %
Hgb A2 Quant: 3.3 % — ABNORMAL HIGH (ref 2.2–3.2)
Hgb F Quant: 0 % (ref 0.0–2.0)
Hgb S Quant: 0 %

## 2013-12-24 ENCOUNTER — Telehealth: Payer: Self-pay | Admitting: General Practice

## 2013-12-24 NOTE — Telephone Encounter (Signed)
Patient called and left message stating she dropped off her FMLA papers on the 22nd and she has a question, please call back as soon as possible. Called patient back and she asked if her papers were completed. Told patient her papers are completed and she may come by and pick them up. Patient verbalized understanding and had no further questions

## 2013-12-28 ENCOUNTER — Encounter (HOSPITAL_COMMUNITY): Payer: Self-pay | Admitting: *Deleted

## 2013-12-28 ENCOUNTER — Inpatient Hospital Stay (HOSPITAL_COMMUNITY): Payer: Medicaid Other

## 2013-12-28 ENCOUNTER — Inpatient Hospital Stay (HOSPITAL_COMMUNITY)
Admission: AD | Admit: 2013-12-28 | Discharge: 2013-12-28 | Disposition: A | Discharge status: Home / Self Care | Payer: Medicaid Other | Source: Ambulatory Visit | Attending: Family Medicine | Admitting: Family Medicine

## 2013-12-28 DIAGNOSIS — O209 Hemorrhage in early pregnancy, unspecified: Secondary | ICD-10-CM

## 2013-12-28 DIAGNOSIS — O99019 Anemia complicating pregnancy, unspecified trimester: Secondary | ICD-10-CM | POA: Insufficient documentation

## 2013-12-28 DIAGNOSIS — D649 Anemia, unspecified: Secondary | ICD-10-CM

## 2013-12-28 DIAGNOSIS — O30009 Twin pregnancy, unspecified number of placenta and unspecified number of amniotic sacs, unspecified trimester: Secondary | ICD-10-CM | POA: Insufficient documentation

## 2013-12-28 LAB — CBC
HCT: 25.4 % — ABNORMAL LOW (ref 36.0–46.0)
HEMOGLOBIN: 8.6 g/dL — AB (ref 12.0–15.0)
MCH: 32.1 pg (ref 26.0–34.0)
MCHC: 33.9 g/dL (ref 30.0–36.0)
MCV: 94.8 fL (ref 78.0–100.0)
PLATELETS: 165 10*3/uL (ref 150–400)
RBC: 2.68 MIL/uL — AB (ref 3.87–5.11)
RDW: 13 % (ref 11.5–15.5)
WBC: 9.7 10*3/uL (ref 4.0–10.5)

## 2013-12-28 LAB — URINALYSIS, ROUTINE W REFLEX MICROSCOPIC
BILIRUBIN URINE: NEGATIVE
Glucose, UA: NEGATIVE mg/dL
Ketones, ur: NEGATIVE mg/dL
Leukocytes, UA: NEGATIVE
NITRITE: NEGATIVE
PH: 6 (ref 5.0–8.0)
Protein, ur: NEGATIVE mg/dL
SPECIFIC GRAVITY, URINE: 1.01 (ref 1.005–1.030)
UROBILINOGEN UA: 0.2 mg/dL (ref 0.0–1.0)

## 2013-12-28 LAB — URINE MICROSCOPIC-ADD ON

## 2013-12-28 MED ORDER — FERROUS SULFATE 325 (65 FE) MG PO TABS: 325.0000 mg | ORAL_TABLET | Freq: Every day | ORAL | Status: DC

## 2013-12-28 NOTE — [facility] Provider Note (Signed)
Attestation of Attending Supervision of Advanced Practitioner (PA/CNM/NP): Evaluation and management procedures were performed by the Advanced Practitioner under my supervision and collaboration.  I have reviewed the Advanced Practitioner's note and chart, and I agree with the management and plan.  Donnamae Jude, MD [facility] Attending 12/28/2013 9:10 PM

## 2013-12-28 NOTE — MAU Note (Signed)
C/o spotting since 2300 last night; c/o cramping since yesterday morning;

## 2013-12-28 NOTE — MAU Note (Signed)
Hx of kidney infection; c/o UTI symptoms; c/o vaginal bleeding but was not wearing a peri-pad or pantiliner;  States that this pregnacy started out as a twin gestation but  "she lost one of the twins";

## 2013-12-28 NOTE — MAU Provider Note (Signed)
History     CSN: [id number]  Arrival date and time: 12/28/13 1222   None     Chief Complaint  Patient presents with  . Vaginal Bleeding   HPI Pt is [redacted] weeks pregnant with twin gestation with one twin IUFD, dx 11/16/2013.  Pt states that she started having some Spotting last night, when she wiped. Pt had bright red spotting last night but now dark brown. Pt has hx of kidney infection.  Pt has some mild cramping.  Pt is being seen in HR clinic.  Pt was told they would not need to do any intervention.  Pt denies pain with urination.  Deloris Wendelyn Breslow, RN Registered Nurse Signed  MAU Note Service date: 12/28/2013 12:50 PM   Hx of kidney infection; c/o UTI symptoms; c/o vaginal bleeding but was not wearing a peri-pad or pantiliner; States that this pregnacy started out as a twin gestation but "she lost one of the twins     Past Medical History  Diagnosis Date  . Pyelonephritis     Past Surgical History  Procedure Laterality Date  . No past surgeries      Family History  Problem Relation Age of Onset  . Alcohol abuse Neg Hx     History  Substance Use Topics  . Smoking status: Never Smoker   . Smokeless tobacco: Never Used  . Alcohol Use: No    Allergies:  Allergies  Allergen Reactions  . Ondansetron Hives and Itching  . Promethazine Hives and Itching    Prescriptions prior to admission  Medication Sig Dispense Refill  . acetaminophen (TYLENOL) 500 MG tablet Take 500 mg by mouth every 6 (six) hours as needed for fever.       . metoCLOPramide (REGLAN) 10 MG tablet Take 1 tablet (10 mg total) by mouth every 6 (six) hours as needed for nausea.  30 tablet  1  . Prenatal Vit-Fe Fumarate-FA (PRENATAL MULTIVITAMIN) TABS tablet Take 1 tablet by mouth at bedtime.         Review of Systems  Constitutional: Negative for fever and chills.  Gastrointestinal: Positive for abdominal pain. Negative for nausea, vomiting and diarrhea.  Neurological: Positive for headaches.    Physical Exam   Blood pressure 101/63, pulse 84, temperature 98.5 F (36.9 C), resp. rate 16, height 4\' 9"  (1.448 m), weight 82 lb (37.195 kg), last menstrual period 09/14/2013.  Physical Exam  Vitals reviewed. Constitutional: She is oriented to person, place, and time. She appears well-developed and well-nourished.  HENT:  Head: Normocephalic.  Eyes: Pupils are equal, round, and reactive to light.  Neck: Normal range of motion. Neck supple.  Cardiovascular: Normal rate.   Respiratory: Effort normal.  GI: Soft. There is no tenderness.  Genitourinary:  Small amount of dark brown discharge in vault; cervix closed; uterus gravid AGA.  FHR with doppler  Musculoskeletal: Normal range of motion.  Neurological: She is alert and oriented to person, place, and time.  Skin: Skin is warm and dry.  Psychiatric: She has a normal mood and affect.    MAU Course  Procedures  Previous ultrasound report from fetal demise not able to be accessed Results for orders placed during the hospital encounter of 12/28/13 (from the past 24 hour(s))  URINALYSIS, ROUTINE W REFLEX MICROSCOPIC     Status: Abnormal   Collection Time    12/28/13 12:27 PM      Result Value Ref Range   Color, Urine YELLOW  YELLOW   APPearance CLEAR  CLEAR   Specific Gravity, Urine 1.010  1.005 - 1.030   pH 6.0  5.0 - 8.0   Glucose, UA NEGATIVE  NEGATIVE mg/dL   Hgb urine dipstick TRACE (*) NEGATIVE   Bilirubin Urine NEGATIVE  NEGATIVE   Ketones, ur NEGATIVE  NEGATIVE mg/dL   Protein, ur NEGATIVE  NEGATIVE mg/dL   Urobilinogen, UA 0.2  0.0 - 1.0 mg/dL   Nitrite NEGATIVE  NEGATIVE   Leukocytes, UA NEGATIVE  NEGATIVE  URINE MICROSCOPIC-ADD ON     Status: Abnormal   Collection Time    12/28/13 12:27 PM      Result Value Ref Range   Squamous Epithelial / LPF FEW (*) RARE   WBC, UA 0-2  <3 WBC/hpf   RBC / HPF 0-2  <3 RBC/hpf   Bacteria, UA RARE  RARE  CBC     Status: Abnormal   Collection Time    12/28/13  1:45 PM       Result Value Ref Range   WBC 9.7  4.0 - 10.5 K/uL   RBC 2.68 (*) 3.87 - 5.11 MIL/uL   Hemoglobin 8.6 (*) 12.0 - 15.0 g/dL   HCT 25.4 (*) 36.0 - 46.0 %   MCV 94.8  78.0 - 100.0 fL   MCH 32.1  26.0 - 34.0 pg   MCHC 33.9  30.0 - 36.0 g/dL   RDW 13.0  11.5 - 15.5 %   Platelets 165  150 - 400 K/uL   Assessment and Plan  Bleeding in pregnancy Twin B gestational demise Anemia- FeSo4 daily- information on iron rich foods and   [place] P[place] 12/28/2013, 1:13 PM

## 2014-01-04 ENCOUNTER — Telehealth: Payer: Self-pay | Admitting: Family Medicine

## 2014-01-04 NOTE — Telephone Encounter (Signed)
ER letter

## 2014-01-05 ENCOUNTER — Encounter: Payer: Self-pay | Admitting: *Deleted

## 2014-01-06 ENCOUNTER — Encounter: Payer: Self-pay | Admitting: Family

## 2014-01-06 ENCOUNTER — Ambulatory Visit (INDEPENDENT_AMBULATORY_CARE_PROVIDER_SITE_OTHER): Payer: Medicaid Other | Admitting: Family

## 2014-01-06 VITALS — BP 106/70 | HR 94 | Temp 98.1°F | Wt 85.2 lb

## 2014-01-06 DIAGNOSIS — Z3401 Encounter for supervision of normal first pregnancy, first trimester: Secondary | ICD-10-CM

## 2014-01-06 DIAGNOSIS — Z3400 Encounter for supervision of normal first pregnancy, unspecified trimester: Secondary | ICD-10-CM

## 2014-01-06 LAB — POCT URINALYSIS DIP (DEVICE)
BILIRUBIN URINE: NEGATIVE
Glucose, UA: NEGATIVE mg/dL
Hgb urine dipstick: NEGATIVE
KETONES UR: NEGATIVE mg/dL
Leukocytes, UA: NEGATIVE
Nitrite: NEGATIVE
Protein, ur: NEGATIVE mg/dL
Specific Gravity, Urine: 1.015 (ref 1.005–1.030)
Urobilinogen, UA: 0.2 mg/dL (ref 0.0–1.0)
pH: 7 (ref 5.0–8.0)

## 2014-01-06 NOTE — Progress Notes (Signed)
Nutrition note: 1st nutr visit & consult for wt loss Pt has lost 0.8# @ [redacted]w[redacted]d but pt gained 2.4# since last appt 5/22 (~1/2# /wk). Pt reports she had a lot of N/V in the beginning but that has improved & is now eating 6-7x/d. Pt is taking a PNV & iron supplement due to low iron. Pt reports walking daily. Pt received verbal & written education on general nutrition. Encouraged small freq meals & provided handout with energy dense snack ideas. Discussed wt gain goals of 25-35# or 1#/wk.  Pt agrees to continue taking PNV & iron supplement and eat energy dense snacks. Pt has [place] & plans to BF. F/u in 4-6 wks Vladimir Faster, MS, RD, LDN, [facility]

## 2014-01-06 NOTE — Progress Notes (Signed)
U/S scheduled 01/28/14 at 1030 am.

## 2014-01-06 NOTE — Progress Notes (Signed)
Appetite is increasing, eating more.  No questions or concerns.  Quad today.  Scheduled anatomy ultrasound for 4 weeks.

## 2014-01-07 ENCOUNTER — Encounter: Payer: Medicaid Other | Admitting: Family

## 2014-01-12 ENCOUNTER — Encounter: Payer: Self-pay | Admitting: Family

## 2014-01-13 ENCOUNTER — Other Ambulatory Visit: Payer: Self-pay | Admitting: Family

## 2014-01-13 DIAGNOSIS — Z3401 Encounter for supervision of normal first pregnancy, first trimester: Secondary | ICD-10-CM

## 2014-01-13 DIAGNOSIS — R772 Abnormality of alphafetoprotein: Secondary | ICD-10-CM

## 2014-01-13 NOTE — Progress Notes (Signed)
Pt notified via language line (N[place]ali interpreter#[id number]) that AFP showed increased risk for Down's Syndrome and that it is just a screening test and that additional tests are available to confirm.  Someone will call with an appt with MFM for genetic counseling.

## 2014-01-17 ENCOUNTER — Telehealth: Payer: Self-pay | Admitting: *Deleted

## 2014-01-17 DIAGNOSIS — Z3401 Encounter for supervision of normal first pregnancy, first trimester: Secondary | ICD-10-CM

## 2014-01-17 DIAGNOSIS — O280 Abnormal hematological finding on antenatal screening of mother: Secondary | ICD-10-CM

## 2014-01-17 NOTE — Telephone Encounter (Addendum)
Called [employer]- nepali interpreter not available. Per chart does not need interpreter. Called Shelly Koch and she does speak/ understand [place].. Explained to her quad screen came back abnormal showing increased downs risk and we made appointment with specialist to explain results / risks to her.  Gave her appointment date- she asked if they could see her today- I called MFM and they could not see her today.  Informed her MFM could not see her today. She requested her appointment be moved to July 10 when she has Ultrasound, I explained to her we would rather she have the consult this week because she is [redacted] weeks along and this would give her longer to make a decision if she wanted to continue pregnancy or make other decisions and that the MFM specialist would explain results/ risks .  Gave her phone number to change appointment if she decides to .

## 2014-01-17 NOTE — Telephone Encounter (Signed)
Message copied by Samuel Germany on Mon Jan 17, 2014 10:00 AM ------      Message from: Joelyn Oms      Created: Thu Jan 13, 2014  3:55 PM      Regarding: Need MFM Genetic Counseling - Increased Down's Risk       Pt informed regarding elevated downs risk > need MFM genetic counseling.  nepali interpreter needed.       ----- Message -----         From: [facility]         Sent: 01/11/2014   1:09 PM           To: Joelyn Oms, CNM                   ------

## 2014-01-17 NOTE — Telephone Encounter (Signed)
Made appointment with MFM for 01/19/14 2pm.

## 2014-01-19 ENCOUNTER — Ambulatory Visit (HOSPITAL_COMMUNITY)
Admission: RE | Admit: 2014-01-19 | Discharge: 2014-01-19 | Disposition: A | Discharge status: Home / Self Care | Payer: Medicaid Other | Source: Ambulatory Visit | Attending: Family | Admitting: Family

## 2014-01-19 ENCOUNTER — Ambulatory Visit (HOSPITAL_COMMUNITY)
Admission: RE | Admit: 2014-01-19 | Discharge: 2014-01-19 | Disposition: A | Discharge status: Home / Self Care | Payer: Medicaid Other | Source: Ambulatory Visit | Attending: Obstetrics & Gynecology | Admitting: Obstetrics & Gynecology

## 2014-01-19 ENCOUNTER — Other Ambulatory Visit: Payer: Self-pay | Admitting: Obstetrics & Gynecology

## 2014-01-19 ENCOUNTER — Encounter: Payer: Self-pay | Admitting: Family

## 2014-01-19 DIAGNOSIS — O289 Unspecified abnormal findings on antenatal screening of mother: Secondary | ICD-10-CM

## 2014-01-19 DIAGNOSIS — O351XX0 Maternal care for (suspected) chromosomal abnormality in fetus, not applicable or unspecified: Secondary | ICD-10-CM | POA: Insufficient documentation

## 2014-01-19 DIAGNOSIS — IMO0002 Reserved for concepts with insufficient information to code with codable children: Secondary | ICD-10-CM | POA: Insufficient documentation

## 2014-01-19 DIAGNOSIS — O3510X0 Maternal care for (suspected) chromosomal abnormality in fetus, unspecified, not applicable or unspecified: Secondary | ICD-10-CM | POA: Insufficient documentation

## 2014-01-19 LAB — AFP, QUAD SCREEN
AFP: 80.5 [IU]/mL
Age Alone: 1:1070 {titer}
CURR GEST AGE: 16.2 wks.days
HCG, Total: 83687 m[IU]/mL
INH: 1000.4 pg/mL
Interpretation-AFP: POSITIVE — AB
MOM FOR INH: 4.39
MoM for AFP: 2.12
MoM for hCG: 3.06
Open Spina bifida: NEGATIVE
Tri 18 Scr Risk Est: NEGATIVE
uE3 Mom: 0.69
uE3 Value: 0.4 ng/mL

## 2014-01-19 NOTE — Progress Notes (Signed)
°  Genetic Counseling  DOB: 1991/03/03 Referring Provider: Joelyn Oms, CNM Appointment Date: 01/19/2014  Attending: Dr. Benjaman Lobe  [name] and her partner, [name], were seen for genetic counseling because of an increased risk for fetal Down syndrome based on a maternal serum Quad screen.  They were counseled regarding the Quad screen result and the associated 1 in 6 risk for fetal Down syndrome.  We reviewed chromosomes, nondisjunction, and the common features and variable prognosis of Down syndrome.  In addition, we reviewed the screen adjusted reduction in risks for trisomy 18 and ONTDs.  We also discussed other explanations for a screen positive result including: a gestational dating error, differences in maternal metabolism, and normal variation. They understand that this screening is not diagnostic for Down syndrome but provides a risk assessment.  Upon review of her Quad screen results, it was determined that the laboratory interpreted the results utilizing the wrong ultrasound information.  [name] had ultrasound on 6/9, at which time she was 15.0 weeks, this confirmed her 2/24 LMP and was also consistent with a 12/1 due date.  By that dating she would have been 16-2 on the date of her sample (6/18) however the laboratory calculated her gestation as 17.4.  When the result was recalculated, taking into account the correct information, a risk of 1 in 179 was generated.  We discussed this change in the risk assessment as well.    We reviewed available screening options including noninvasive prenatal screening (NIPS)/cell free fetal DNA (cffDNA) testing, and detailed ultrasound.  They were counseled that screening tests are used to modify a patient's a priori risk for aneuploidy, typically based on age. This estimate provides a pregnancy specific risk assessment. We reviewed the benefits and limitations of each option. Specifically, we discussed the conditions for  which each test screens, the detection rates, and false positive rates of each. They were also counseled regarding diagnostic testing via amniocentesis. We reviewed the approximate 1 in 532-992 risk for complications from amniocentesis, including spontaneous pregnancy loss.   The patient also expressed interest in having a detailed ultrasound.  This had previously been scheduled for 7/10.  The patient requested that it be performed today, if possible.  They were counseled that 50-80% of fetuses with Down syndrome will have detectable anomalies or soft markers of aneuploidy on detailed anatomy ultrasound.  A complete ultrasound was performed today. The ultrasound report will be sent under separate cover. There were no visualized fetal anomalies or markers suggestive of aneuploidy.  However, a VSD was suspected.  Diagnostic testing was declined today.  They understand that screening tests cannot rule out all birth defects or genetic syndromes. The patient was advised of this limitation .  After consideration of all the options, they elected to proceed with NIPS.  Due to the demise of the co-twin at an early gestational age, NIPS was performed through Shelly Koch.   Those results will be available in 8-10 days.     [name] denied exposure to environmental toxins or chemical agents. She denied the use of alcohol, tobacco or street drugs. She denied significant viral illnesses during the course of her pregnancy. Her medical and surgical histories were noncontributory.   I counseled this couple for approximately 45 minutes regarding the above risks and available options.   Cam Hai, MS  Certified Genetic Counselor

## 2014-01-26 ENCOUNTER — Other Ambulatory Visit: Payer: Self-pay

## 2014-01-27 ENCOUNTER — Telehealth (HOSPITAL_COMMUNITY): Payer: Self-pay | Admitting: Maternal and Fetal Medicine

## 2014-01-27 NOTE — Telephone Encounter (Addendum)
Called Shelly Koch to review the results of her cell free DNA testing.  Left a voice mail for her to return my call.  Shelly Koch returned my call.   [name] had Harmony testing through [facility].  Testing was offered because of an increased risk for Down syndrome by Quad screen and a possible VSD.   The patient was identified by name and DOB.  We reviewed that these are within normal limits, showing a less than 1 in 10,000 risk for trisomies 21, 18 and 13.  We reviewed that this testing identifies > 99% of pregnancies with trisomy 82 and trisomy 49. The detection rate for trisomy 18 is 96%.  The false positive rate is <0.1% for all conditions.  She understands that this testing does not identify all genetic conditions.  Reinforced that this does not change the fact that there may be a VSD, that she will still need the echo to evaluate the fetal heart. All questions were answered to her satisfaction, she was encouraged to call with additional questions or concerns.  Cam Hai, MS Certified Genetic Counselor

## 2014-01-28 ENCOUNTER — Ambulatory Visit (HOSPITAL_COMMUNITY): Payer: Medicaid Other

## 2014-02-03 ENCOUNTER — Ambulatory Visit (INDEPENDENT_AMBULATORY_CARE_PROVIDER_SITE_OTHER): Payer: Medicaid Other | Admitting: Obstetrics & Gynecology

## 2014-02-03 VITALS — BP 113/75 | HR 86 | Temp 98.8°F | Wt 88.1 lb

## 2014-02-03 DIAGNOSIS — IMO0002 Reserved for concepts with insufficient information to code with codable children: Secondary | ICD-10-CM

## 2014-02-03 DIAGNOSIS — O280 Abnormal hematological finding on antenatal screening of mother: Secondary | ICD-10-CM | POA: Insufficient documentation

## 2014-02-03 LAB — POCT URINALYSIS DIP (DEVICE)
Bilirubin Urine: NEGATIVE
Glucose, UA: NEGATIVE mg/dL
Hgb urine dipstick: NEGATIVE
Ketones, ur: NEGATIVE mg/dL
Leukocytes, UA: NEGATIVE
Nitrite: NEGATIVE
Protein, ur: NEGATIVE mg/dL
Specific Gravity, Urine: 1.01 (ref 1.005–1.030)
Urobilinogen, UA: 0.2 mg/dL (ref 0.0–1.0)
pH: 7 (ref 5.0–8.0)

## 2014-02-03 NOTE — Progress Notes (Signed)
[place] on 7/1, growth OK, will have repeat to check heart in 2 weeks. States tooth bleedsn eed dental f/u. States Harmony test nl

## 2014-02-03 NOTE — Progress Notes (Signed)
Pain/pressure-"cramping"

## 2014-02-03 NOTE — Patient Instructions (Signed)
Second Trimester of Pregnancy The second trimester is from week 13 through week 28, months 4 through 6. The second trimester is often a time when you feel your best. Your body has also adjusted to being pregnant, and you begin to feel better physically. Usually, morning sickness has lessened or quit completely, you may have more energy, and you may have an increase in appetite. The second trimester is also a time when the fetus is growing rapidly. At the end of the sixth month, the fetus is about 9 inches long and weighs about 1 pounds. You will likely begin to feel the baby move (quickening) between 18 and 20 weeks of the pregnancy. BODY CHANGES Your body goes through many changes during pregnancy. The changes vary from woman to woman.   Your weight will continue to increase. You will notice your lower abdomen bulging out.  You may begin to get stretch marks on your hips, abdomen, and breasts.  You may develop headaches that can be relieved by medicines approved by your health care provider.  You may urinate more often because the fetus is pressing on your bladder.  You may develop or continue to have heartburn as a result of your pregnancy.  You may develop constipation because certain hormones are causing the muscles that push waste through your intestines to slow down.  You may develop hemorrhoids or swollen, bulging veins (varicose veins).  You may have back pain because of the weight gain and pregnancy hormones relaxing your joints between the bones in your pelvis and as a result of a shift in weight and the muscles that support your balance.  Your breasts will continue to grow and be tender.  Your gums may bleed and may be sensitive to brushing and flossing.  Dark spots or blotches (chloasma, mask of pregnancy) may develop on your face. This will likely fade after the baby is born.  A dark line from your belly button to the pubic area (linea nigra) may appear. This will likely fade  after the baby is born.  You may have changes in your hair. These can include thickening of your hair, rapid growth, and changes in texture. Some women also have hair loss during or after pregnancy, or hair that feels dry or thin. Your hair will most likely return to normal after your baby is born. WHAT TO EXPECT AT YOUR PRENATAL VISITS During a routine prenatal visit:  You will be weighed to make sure you and the fetus are growing normally.  Your blood pressure will be taken.  Your abdomen will be measured to track your baby's growth.  The fetal heartbeat will be listened to.  Any test results from the previous visit will be discussed. Your health care provider may ask you:  How you are feeling.  If you are feeling the baby move.  If you have had any abnormal symptoms, such as leaking fluid, bleeding, severe headaches, or abdominal cramping.  If you have any questions. Other tests that may be performed during your second trimester include:  Blood tests that check for:  Low iron levels (anemia).  Gestational diabetes (between 24 and 28 weeks).  Rh antibodies.  Urine tests to check for infections, diabetes, or protein in the urine.  An ultrasound to confirm the proper growth and development of the baby.  An amniocentesis to check for possible genetic problems.  Fetal screens for spina bifida and Down syndrome. HOME CARE INSTRUCTIONS   Avoid all smoking, herbs, alcohol, and unprescribed   drugs. These chemicals affect the formation and growth of the baby.  Follow your health care provider's instructions regarding medicine use. There are medicines that are either safe or unsafe to take during pregnancy.  Exercise only as directed by your health care provider. Experiencing uterine cramps is a good sign to stop exercising.  Continue to eat regular, healthy meals.  Wear a good support bra for breast tenderness.  Do not use hot tubs, steam rooms, or saunas.  Wear your  seat belt at all times when driving.  Avoid raw meat, uncooked cheese, cat litter boxes, and soil used by cats. These carry germs that can cause birth defects in the baby.  Take your prenatal vitamins.  Try taking a stool softener (if your health care provider approves) if you develop constipation. Eat more high-fiber foods, such as fresh vegetables or fruit and whole grains. Drink plenty of fluids to keep your urine clear or pale yellow.  Take warm sitz baths to soothe any pain or discomfort caused by hemorrhoids. Use hemorrhoid cream if your health care provider approves.  If you develop varicose veins, wear support hose. Elevate your feet for 15 minutes, 3-4 times a day. Limit salt in your diet.  Avoid heavy lifting, wear low heel shoes, and practice good posture.  Rest with your legs elevated if you have leg cramps or low back pain.  Visit your dentist if you have not gone yet during your pregnancy. Use a soft toothbrush to brush your teeth and be gentle when you floss.  A sexual relationship may be continued unless your health care provider directs you otherwise.  Continue to go to all your prenatal visits as directed by your health care provider. SEEK MEDICAL CARE IF:   You have dizziness.  You have mild pelvic cramps, pelvic pressure, or nagging pain in the abdominal area.  You have persistent nausea, vomiting, or diarrhea.  You have a bad smelling vaginal discharge.  You have pain with urination. SEEK IMMEDIATE MEDICAL CARE IF:   You have a fever.  You are leaking fluid from your vagina.  You have spotting or bleeding from your vagina.  You have severe abdominal cramping or pain.  You have rapid weight gain or loss.  You have shortness of breath with chest pain.  You notice sudden or extreme swelling of your face, hands, ankles, feet, or legs.  You have not felt your baby move in over an hour.  You have severe headaches that do not go away with  medicine.  You have vision changes. Document Released: 07/02/2001 Document Revised: 07/13/2013 Document Reviewed: 09/08/2012 [employer] Patient Information 2015 [employer]. This information is not intended to replace advice given to you by your health care provider. Make sure you discuss any questions you have with your health care provider.  

## 2014-02-17 ENCOUNTER — Ambulatory Visit (HOSPITAL_COMMUNITY)
Admission: RE | Admit: 2014-02-17 | Discharge: 2014-02-17 | Disposition: A | Discharge status: Home / Self Care | Payer: Medicaid Other | Source: Ambulatory Visit | Attending: Family Medicine | Admitting: Family Medicine

## 2014-02-17 ENCOUNTER — Other Ambulatory Visit: Payer: Self-pay

## 2014-02-17 ENCOUNTER — Encounter (HOSPITAL_COMMUNITY): Payer: Self-pay

## 2014-02-17 DIAGNOSIS — O289 Unspecified abnormal findings on antenatal screening of mother: Secondary | ICD-10-CM | POA: Insufficient documentation

## 2014-02-17 DIAGNOSIS — Z3689 Encounter for other specified antenatal screening: Secondary | ICD-10-CM | POA: Insufficient documentation

## 2014-03-03 ENCOUNTER — Other Ambulatory Visit: Payer: Self-pay | Admitting: Obstetrics & Gynecology

## 2014-03-03 ENCOUNTER — Ambulatory Visit (INDEPENDENT_AMBULATORY_CARE_PROVIDER_SITE_OTHER): Payer: Medicaid Other | Admitting: Family Medicine

## 2014-03-03 VITALS — BP 111/70 | HR 86 | Temp 98.3°F | Wt 92.6 lb

## 2014-03-03 DIAGNOSIS — D649 Anemia, unspecified: Secondary | ICD-10-CM

## 2014-03-03 DIAGNOSIS — IMO0002 Reserved for concepts with insufficient information to code with codable children: Secondary | ICD-10-CM

## 2014-03-03 DIAGNOSIS — Z3400 Encounter for supervision of normal first pregnancy, unspecified trimester: Secondary | ICD-10-CM

## 2014-03-03 DIAGNOSIS — Z3401 Encounter for supervision of normal first pregnancy, first trimester: Secondary | ICD-10-CM

## 2014-03-03 DIAGNOSIS — O99019 Anemia complicating pregnancy, unspecified trimester: Secondary | ICD-10-CM

## 2014-03-03 DIAGNOSIS — O358XX0 Maternal care for other (suspected) fetal abnormality and damage, not applicable or unspecified: Secondary | ICD-10-CM

## 2014-03-03 DIAGNOSIS — O289 Unspecified abnormal findings on antenatal screening of mother: Secondary | ICD-10-CM

## 2014-03-03 LAB — CBC
HEMATOCRIT: 29.6 % — AB (ref 36.0–46.0)
HEMOGLOBIN: 10.2 g/dL — AB (ref 12.0–15.0)
MCH: 33.1 pg (ref 26.0–34.0)
MCHC: 34.5 g/dL (ref 30.0–36.0)
MCV: 96.1 fL (ref 78.0–100.0)
PLATELETS: 199 10*3/uL (ref 150–400)
RBC: 3.08 MIL/uL — AB (ref 3.87–5.11)
RDW: 13 % (ref 11.5–15.5)
WBC: 11.8 10*3/uL — ABNORMAL HIGH (ref 4.0–10.5)

## 2014-03-03 LAB — POCT URINALYSIS DIP (DEVICE)
Bilirubin Urine: NEGATIVE
Glucose, UA: NEGATIVE mg/dL
Hgb urine dipstick: NEGATIVE
KETONES UR: NEGATIVE mg/dL
Leukocytes, UA: NEGATIVE
Nitrite: NEGATIVE
PROTEIN: NEGATIVE mg/dL
SPECIFIC GRAVITY, URINE: 1.01 (ref 1.005–1.030)
Urobilinogen, UA: 0.2 mg/dL (ref 0.0–1.0)
pH: 7 (ref 5.0–8.0)

## 2014-03-03 MED ORDER — FERROUS SULFATE 325 (65 FE) MG PO TABS: 325.0000 mg | ORAL_TABLET | Freq: Every day | ORAL | Status: DC

## 2014-03-03 NOTE — Progress Notes (Signed)
Patient is 23 y.o. G1P0 [redacted]w[redacted]d.  +FM, denies LOF, VB, contractions, vaginal discharge.  Overall feeling well. - needs refill of iron supplements, follow up CBC today

## 2014-03-03 NOTE — Progress Notes (Signed)
Patient reports pelvic pressure and pain as well as acid reflux/heartburn; currently out of iron and has not refilled

## 2014-03-17 ENCOUNTER — Ambulatory Visit (HOSPITAL_COMMUNITY)
Admission: RE | Admit: 2014-03-17 | Discharge: 2014-03-17 | Disposition: A | Discharge status: Home / Self Care | Payer: Medicaid Other | Source: Ambulatory Visit | Attending: Family Medicine | Admitting: Family Medicine

## 2014-03-17 ENCOUNTER — Other Ambulatory Visit: Payer: Self-pay | Admitting: Obstetrics & Gynecology

## 2014-03-17 VITALS — BP 115/82 | HR 89 | Wt 95.0 lb

## 2014-03-17 DIAGNOSIS — Z3689 Encounter for other specified antenatal screening: Secondary | ICD-10-CM | POA: Diagnosis not present

## 2014-03-17 DIAGNOSIS — O289 Unspecified abnormal findings on antenatal screening of mother: Secondary | ICD-10-CM | POA: Diagnosis not present

## 2014-03-17 DIAGNOSIS — O358XX0 Maternal care for other (suspected) fetal abnormality and damage, not applicable or unspecified: Secondary | ICD-10-CM

## 2014-03-17 DIAGNOSIS — IMO0002 Reserved for concepts with insufficient information to code with codable children: Secondary | ICD-10-CM

## 2014-03-17 DIAGNOSIS — O365920 Maternal care for other known or suspected poor fetal growth, second trimester, not applicable or unspecified: Secondary | ICD-10-CM

## 2014-03-30 ENCOUNTER — Inpatient Hospital Stay (HOSPITAL_COMMUNITY)
Admission: AD | Admit: 2014-03-30 | Discharge: 2014-03-30 | Disposition: A | Discharge status: Home / Self Care | Payer: Medicaid Other | Source: Ambulatory Visit | Attending: Obstetrics & Gynecology | Admitting: Obstetrics & Gynecology

## 2014-03-30 ENCOUNTER — Encounter (HOSPITAL_COMMUNITY): Payer: Self-pay | Admitting: *Deleted

## 2014-03-30 DIAGNOSIS — O9989 Other specified diseases and conditions complicating pregnancy, childbirth and the puerperium: Principal | ICD-10-CM

## 2014-03-30 DIAGNOSIS — O99891 Other specified diseases and conditions complicating pregnancy: Secondary | ICD-10-CM | POA: Insufficient documentation

## 2014-03-30 DIAGNOSIS — N949 Unspecified condition associated with female genital organs and menstrual cycle: Secondary | ICD-10-CM | POA: Insufficient documentation

## 2014-03-30 DIAGNOSIS — R109 Unspecified abdominal pain: Secondary | ICD-10-CM | POA: Diagnosis present

## 2014-03-30 DIAGNOSIS — R102 Pelvic and perineal pain: Secondary | ICD-10-CM

## 2014-03-30 DIAGNOSIS — O26899 Other specified pregnancy related conditions, unspecified trimester: Secondary | ICD-10-CM

## 2014-03-30 LAB — WET PREP, GENITAL
Clue Cells Wet Prep HPF POC: NONE SEEN
TRICH WET PREP: NONE SEEN
Yeast Wet Prep HPF POC: NONE SEEN

## 2014-03-30 LAB — URINALYSIS, ROUTINE W REFLEX MICROSCOPIC
BILIRUBIN URINE: NEGATIVE
Glucose, UA: NEGATIVE mg/dL
Hgb urine dipstick: NEGATIVE
KETONES UR: NEGATIVE mg/dL
Leukocytes, UA: NEGATIVE
Nitrite: NEGATIVE
Protein, ur: NEGATIVE mg/dL
UROBILINOGEN UA: 0.2 mg/dL (ref 0.0–1.0)
pH: 7 (ref 5.0–8.0)

## 2014-03-30 NOTE — MAU Provider Note (Signed)
History     CSN: [id number]  Arrival date and time: 03/30/14 1759   None     Chief Complaint  Patient presents with  . Abdominal Pain   HPI Shelly Koch 23 y.o. G1P0 @ [redacted]w[redacted]d presents to MAU complaining of intermittent pain that has been 5-7/10.  Presently it hurts just a little bit.  It is located below her belly just in the middle over her bladder.  It began yesterday.  She did not sleep much last night or today because of the pain.   She has not identified any aggravating or alleviating factors.  She has had nausea.  She denies vomiting, fever, bleeding, LOF, dysuria.  She notes good fetal movement.   OB History   Grav Para Term Preterm Abortions TAB SAB Ect Mult Living   1               Past Medical History  Diagnosis Date  . Pyelonephritis     Past Surgical History  Procedure Laterality Date  . No past surgeries      Family History  Problem Relation Age of Onset  . Alcohol abuse Neg Hx     History  Substance Use Topics  . Smoking status: Never Smoker   . Smokeless tobacco: Never Used  . Alcohol Use: No    Allergies:  Allergies  Allergen Reactions  . Ondansetron Hives and Itching  . Promethazine Hives and Itching    Prescriptions prior to admission  Medication Sig Dispense Refill  . ferrous sulfate 325 (65 FE) MG tablet Take 1 tablet (325 mg total) by mouth daily.  30 tablet  7  . Prenatal Vit-Fe Fumarate-FA (PRENATAL MULTIVITAMIN) TABS tablet Take 1 tablet by mouth at bedtime.         Review of Systems  Constitutional: Negative for fever, chills and diaphoresis.  HENT: Negative for sore throat.   Respiratory: Negative for cough, shortness of breath and wheezing.   Gastrointestinal: Positive for nausea and abdominal pain. Negative for vomiting, diarrhea and constipation.  Genitourinary: Positive for frequency. Negative for dysuria and hematuria.  Musculoskeletal: Positive for back pain.  Skin: Positive for itching. Negative for rash.  Neurological:  Positive for headaches. Negative for weakness.  Psychiatric/Behavioral: Negative for depression, suicidal ideas and substance abuse. The patient is not nervous/anxious.    Physical Exam   Blood pressure 112/74, pulse 102, temperature 98.1 F (36.7 C), temperature source Oral, resp. rate 18, last menstrual period 09/14/2013.  Physical Exam  Constitutional: She is oriented to person, place, and time. She appears well-developed and well-nourished.  HENT:  Head: Normocephalic and atraumatic.  Eyes: EOM are normal.  Neck: Normal range of motion.  Cardiovascular: Normal rate and regular rhythm.   Respiratory: Breath sounds normal. No respiratory distress.  GI: Soft. Bowel sounds are normal.  Tenderness identified solely over pubic symphysis  Genitourinary:  White homogenous discharge in vagina. Cervix is closed.  Musculoskeletal: Normal range of motion.  Neurological: She is alert and oriented to person, place, and time.  Skin: Skin is warm and dry.  Psychiatric: She has a normal mood and affect.   Fetal Tracing: reviewed with Dr. Deatra Canter, Resident MD.  Limited variability.  1 accel noted.  No decels.  FHR 145  MAU Course  Procedures none  MDM Reviewed with Dr. Deatra Canter, Resident MD.   Care assumed by Monna Fam, NP at 9:22pm.  Assessment and Plan  A: Pelvic pain in pregnancy - pubic symphysis pain 28 week pregnancy  P: Discharge to home Tylenol PRN pain Encourage maternity support belt/stretches Keep f/u appt tomorrow in [place]  Return to MAU for emergency.  Shelly Koch 03/30/2014, 8:00 PM

## 2014-03-30 NOTE — MAU Provider Note (Signed)
  History     CSN: [id number]  Arrival date and time: 03/30/14 1759   None     Chief Complaint  Patient presents with  . Abdominal Pain   Abdominal Pain Associated symptoms include dysuria, frequency and nausea. Pertinent negatives include no diarrhea, fever, hematuria or vomiting.    Patient is 23 y.o. G1P0 [redacted]w[redacted]d, significant hx of twin gestation with one twin IUFD retention of one fetus.  +FM, denies LOF, VB, vaginal discharge.    Abdominal pain: severe, since last night, patient reports not being able to sleep.  Nothing alleviated pain, nothing aggravated pain, pain was intermittent 6/10.  Did not take anything for pain. - +dysuria, +frequency   Past Medical History  Diagnosis Date  . Pyelonephritis     Past Surgical History  Procedure Laterality Date  . No past surgeries      Family History  Problem Relation Age of Onset  . Alcohol abuse Neg Hx     History  Substance Use Topics  . Smoking status: Never Smoker   . Smokeless tobacco: Never Used  . Alcohol Use: No    Allergies:  Allergies  Allergen Reactions  . Ondansetron Hives and Itching  . Promethazine Hives and Itching    Prescriptions prior to admission  Medication Sig Dispense Refill  . ferrous sulfate 325 (65 FE) MG tablet Take 1 tablet (325 mg total) by mouth daily.  30 tablet  7  . Prenatal Vit-Fe Fumarate-FA (PRENATAL MULTIVITAMIN) TABS tablet Take 1 tablet by mouth at bedtime.         Review of Systems  Constitutional: Negative for fever and chills.  Respiratory: Negative for cough and shortness of breath.   Cardiovascular: Negative for chest pain and leg swelling.  Gastrointestinal: Positive for nausea and abdominal pain. Negative for heartburn, vomiting and diarrhea.  Genitourinary: Positive for dysuria and frequency. Negative for urgency and hematuria.  Neurological:       No headache   Physical Exam   Blood pressure 112/74, pulse 102, temperature 98.1 F (36.7 C), temperature source  Oral, resp. rate 18, last menstrual period 09/14/2013.  Physical Exam  Constitutional: She is oriented to person, place, and time. She appears well-developed and well-nourished.  HENT:  Head: Normocephalic and atraumatic.  Eyes: Conjunctivae and EOM are normal.  Neck: Normal range of motion.  Cardiovascular: Normal rate, regular rhythm and normal heart sounds.   Respiratory: Effort normal. No respiratory distress.  GI: Soft. Bowel sounds are normal. She exhibits no distension. There is no tenderness.  Musculoskeletal: Normal range of motion. She exhibits no edema.  Neurological: She is alert and oriented to person, place, and time.  Skin: Skin is warm and dry. No erythema.    MAU Course  Procedures  MDM NST: reactive UA: normal Wet prep: few WBC, normal Assessment and Plan  Pain resolved without intervention or medications given, UA very normal.  Discharged with preterm labor precautions.  Shelly Koch [name] 03/30/2014, 7:06 PM

## 2014-03-30 NOTE — MAU Note (Signed)
Lower abdominal pain started yesterday and it has gotten worse. Denies VB/LOF.

## 2014-03-30 NOTE — Discharge Instructions (Signed)
Use Over the Counter Tylenol for Pelvic Pain.  Do not use more than 3 grams per day.   Harmony may be helpful.     Abdominal Pain Many things can cause belly (abdominal) pain. Most times, the belly pain is not dangerous. Many cases of belly pain can be watched and treated at home. HOME CARE   Do not take medicines that help you go poop (laxatives) unless told to by your doctor.  Only take medicine as told by your doctor.  Eat or drink as told by your doctor. Your doctor will tell you if you should be on a special diet. GET HELP IF:  You do not know what is causing your belly pain.  You have belly pain while you are sick to your stomach (nauseous) or have runny poop (diarrhea).  You have pain while you pee or poop.  Your belly pain wakes you up at night.  You have belly pain that gets worse or better when you eat.  You have belly pain that gets worse when you eat fatty foods.  You have a fever. GET HELP RIGHT AWAY IF:   The pain does not go away within 2 hours.  You keep throwing up (vomiting).  The pain changes and is only in the right or left part of the belly.  You have bloody or tarry looking poop. MAKE SURE YOU:   Understand these instructions.  Will watch your condition.  Will get help right away if you are not doing well or get worse. Document Released: 12/25/2007 Document Revised: 07/13/2013 Document Reviewed: 03/17/2013 [facility] Patient Information 2015 [place], [place]. This information is not intended to replace advice given to you by your health care provider. Make sure you discuss any questions you have with your health care provider.

## 2014-03-31 ENCOUNTER — Ambulatory Visit (INDEPENDENT_AMBULATORY_CARE_PROVIDER_SITE_OTHER): Payer: Medicaid Other | Admitting: Family Medicine

## 2014-03-31 VITALS — BP 116/77 | HR 102 | Temp 98.9°F | Wt 97.3 lb

## 2014-03-31 DIAGNOSIS — IMO0002 Reserved for concepts with insufficient information to code with codable children: Secondary | ICD-10-CM | POA: Diagnosis not present

## 2014-03-31 DIAGNOSIS — R509 Fever, unspecified: Secondary | ICD-10-CM | POA: Diagnosis not present

## 2014-03-31 DIAGNOSIS — Z3400 Encounter for supervision of normal first pregnancy, unspecified trimester: Secondary | ICD-10-CM

## 2014-03-31 DIAGNOSIS — Z23 Encounter for immunization: Secondary | ICD-10-CM

## 2014-03-31 DIAGNOSIS — Z3480 Encounter for supervision of other normal pregnancy, unspecified trimester: Secondary | ICD-10-CM | POA: Diagnosis not present

## 2014-03-31 DIAGNOSIS — Z3401 Encounter for supervision of normal first pregnancy, first trimester: Secondary | ICD-10-CM

## 2014-03-31 DIAGNOSIS — D649 Anemia, unspecified: Secondary | ICD-10-CM | POA: Diagnosis not present

## 2014-03-31 DIAGNOSIS — O210 Mild hyperemesis gravidarum: Secondary | ICD-10-CM | POA: Diagnosis not present

## 2014-03-31 DIAGNOSIS — E639 Nutritional deficiency, unspecified: Secondary | ICD-10-CM | POA: Diagnosis not present

## 2014-03-31 DIAGNOSIS — G43909 Migraine, unspecified, not intractable, without status migrainosus: Secondary | ICD-10-CM | POA: Diagnosis not present

## 2014-03-31 LAB — POCT URINALYSIS DIP (DEVICE)
BILIRUBIN URINE: NEGATIVE
Glucose, UA: NEGATIVE mg/dL
HGB URINE DIPSTICK: NEGATIVE
KETONES UR: NEGATIVE mg/dL
Leukocytes, UA: NEGATIVE
Nitrite: NEGATIVE
PH: 7.5 (ref 5.0–8.0)
Protein, ur: NEGATIVE mg/dL
Specific Gravity, Urine: 1.015 (ref 1.005–1.030)
Urobilinogen, UA: 0.2 mg/dL (ref 0.0–1.0)

## 2014-03-31 LAB — CBC
HEMATOCRIT: 31 % — AB (ref 36.0–46.0)
HEMOGLOBIN: 10.5 g/dL — AB (ref 12.0–15.0)
MCH: 32.5 pg (ref 26.0–34.0)
MCHC: 33.9 g/dL (ref 30.0–36.0)
MCV: 96 fL (ref 78.0–100.0)
Platelets: 185 10*3/uL (ref 150–400)
RBC: 3.23 MIL/uL — AB (ref 3.87–5.11)
RDW: 12.7 % (ref 11.5–15.5)
WBC: 11.9 10*3/uL — ABNORMAL HIGH (ref 4.0–10.5)

## 2014-03-31 MED ORDER — TETANUS-DIPHTH-ACELL PERTUSSIS 5-2.5-18.5 LF-MCG/0.5 IM SUSP: 0.5000 mL | Freq: Once | INTRAMUSCULAR | Status: DC

## 2014-03-31 NOTE — Progress Notes (Signed)
No contractions, bleeding, decreased fetal movements.  No concerns or questions.  Labor precautions given.  1hr GTT, CBC done today.

## 2014-03-31 NOTE — Patient Instructions (Signed)
Third Trimester of Pregnancy The third trimester is from week 29 through week 42, months 7 through 9. The third trimester is a time when the fetus is growing rapidly. At the end of the ninth month, the fetus is about 20 inches in length and weighs 6-10 pounds.  BODY CHANGES Your body goes through many changes during pregnancy. The changes vary from woman to woman.   Your weight will continue to increase. You can expect to gain 25-35 pounds (11-16 kg) by the end of the pregnancy.  You may begin to get stretch marks on your hips, abdomen, and breasts.  You may urinate more often because the fetus is moving lower into your pelvis and pressing on your bladder.  You may develop or continue to have heartburn as a result of your pregnancy.  You may develop constipation because certain hormones are causing the muscles that push waste through your intestines to slow down.  You may develop hemorrhoids or swollen, bulging veins (varicose veins).  You may have pelvic pain because of the weight gain and pregnancy hormones relaxing your joints between the bones in your pelvis. Backaches may result from overexertion of the muscles supporting your posture.  You may have changes in your hair. These can include thickening of your hair, rapid growth, and changes in texture. Some women also have hair loss during or after pregnancy, or hair that feels dry or thin. Your hair will most likely return to normal after your baby is born.  Your breasts will continue to grow and be tender. A yellow discharge may leak from your breasts called colostrum.  Your belly button may stick out.  You may feel short of breath because of your expanding uterus.  You may notice the fetus "dropping," or moving lower in your abdomen.  You may have a bloody mucus discharge. This usually occurs a few days to a week before labor begins.  Your cervix becomes thin and soft (effaced) near your due date. WHAT TO EXPECT AT YOUR PRENATAL  EXAMS  You will have prenatal exams every 2 weeks until week 36. Then, you will have weekly prenatal exams. During a routine prenatal visit:  You will be weighed to make sure you and the fetus are growing normally.  Your blood pressure is taken.  Your abdomen will be measured to track your baby's growth.  The fetal heartbeat will be listened to.  Any test results from the previous visit will be discussed.  You may have a cervical check near your due date to see if you have effaced. At around 36 weeks, your caregiver will check your cervix. At the same time, your caregiver will also perform a test on the secretions of the vaginal tissue. This test is to determine if a type of bacteria, Group B streptococcus, is present. Your caregiver will explain this further. Your caregiver may ask you:  What your birth plan is.  How you are feeling.  If you are feeling the baby move.  If you have had any abnormal symptoms, such as leaking fluid, bleeding, severe headaches, or abdominal cramping.  If you have any questions. Other tests or screenings that may be performed during your third trimester include:  Blood tests that check for low iron levels (anemia).  Fetal testing to check the health, activity level, and growth of the fetus. Testing is done if you have certain medical conditions or if there are problems during the pregnancy. FALSE LABOR You may feel small, irregular contractions that   eventually go away. These are called Braxton Hicks contractions, or false labor. Contractions may last for hours, days, or even weeks before true labor sets in. If contractions come at regular intervals, intensify, or become painful, it is best to be seen by your caregiver.  SIGNS OF LABOR   Menstrual-like cramps.  Contractions that are 5 minutes apart or less.  Contractions that start on the top of the uterus and spread down to the lower abdomen and back.  A sense of increased pelvic pressure or back  pain.  A watery or bloody mucus discharge that comes from the vagina. If you have any of these signs before the 37th week of pregnancy, call your caregiver right away. You need to go to the hospital to get checked immediately. HOME CARE INSTRUCTIONS   Avoid all smoking, herbs, alcohol, and unprescribed drugs. These chemicals affect the formation and growth of the baby.  Follow your caregiver's instructions regarding medicine use. There are medicines that are either safe or unsafe to take during pregnancy.  Exercise only as directed by your caregiver. Experiencing uterine cramps is a good sign to stop exercising.  Continue to eat regular, healthy meals.  Wear a good support bra for breast tenderness.  Do not use hot tubs, steam rooms, or saunas.  Wear your seat belt at all times when driving.  Avoid raw meat, uncooked cheese, cat litter boxes, and soil used by cats. These carry germs that can cause birth defects in the baby.  Take your prenatal vitamins.  Try taking a stool softener (if your caregiver approves) if you develop constipation. Eat more high-fiber foods, such as fresh vegetables or fruit and whole grains. Drink plenty of fluids to keep your urine clear or pale yellow.  Take warm sitz baths to soothe any pain or discomfort caused by hemorrhoids. Use hemorrhoid cream if your caregiver approves.  If you develop varicose veins, wear support hose. Elevate your feet for 15 minutes, 3-4 times a day. Limit salt in your diet.  Avoid heavy lifting, wear low heal shoes, and practice good posture.  Rest a lot with your legs elevated if you have leg cramps or low back pain.  Visit your dentist if you have not gone during your pregnancy. Use a soft toothbrush to brush your teeth and be gentle when you floss.  A sexual relationship may be continued unless your caregiver directs you otherwise.  Do not travel far distances unless it is absolutely necessary and only with the approval  of your caregiver.  Take prenatal classes to understand, practice, and ask questions about the labor and delivery.  Make a trial run to the hospital.  Pack your hospital bag.  Prepare the baby's nursery.  Continue to go to all your prenatal visits as directed by your caregiver. SEEK MEDICAL CARE IF:  You are unsure if you are in labor or if your water has broken.  You have dizziness.  You have mild pelvic cramps, pelvic pressure, or nagging pain in your abdominal area.  You have persistent nausea, vomiting, or diarrhea.  You have a bad smelling vaginal discharge.  You have pain with urination. SEEK IMMEDIATE MEDICAL CARE IF:   You have a fever.  You are leaking fluid from your vagina.  You have spotting or bleeding from your vagina.  You have severe abdominal cramping or pain.  You have rapid weight loss or gain.  You have shortness of breath with chest pain.  You notice sudden or extreme swelling   of your face, hands, ankles, feet, or legs.  You have not felt your baby move in over an hour.  You have severe headaches that do not go away with medicine.  You have vision changes. Document Released: 07/02/2001 Document Revised: 07/13/2013 Document Reviewed: 09/08/2012 Exit[employer] Patient Information 2015 [employer]. This information is not intended to replace advice given to you by your health care provider. Make sure you discuss any questions you have with your health care provider.  

## 2014-04-01 LAB — HIV ANTIBODY (ROUTINE TESTING W REFLEX): HIV 1&2 Ab, 4th Generation: NONREACTIVE

## 2014-04-01 LAB — GLUCOSE TOLERANCE, 1 HOUR (50G) W/O FASTING: Glucose, 1 Hour GTT: 107 mg/dL (ref 70–140)

## 2014-04-01 LAB — RPR

## 2014-04-04 ENCOUNTER — Other Ambulatory Visit: Payer: Self-pay | Admitting: Obstetrics & Gynecology

## 2014-04-04 DIAGNOSIS — O289 Unspecified abnormal findings on antenatal screening of mother: Secondary | ICD-10-CM

## 2014-04-04 DIAGNOSIS — O358XX0 Maternal care for other (suspected) fetal abnormality and damage, not applicable or unspecified: Secondary | ICD-10-CM

## 2014-04-04 DIAGNOSIS — IMO0002 Reserved for concepts with insufficient information to code with codable children: Secondary | ICD-10-CM

## 2014-04-04 DIAGNOSIS — O365931 Maternal care for other known or suspected poor fetal growth, third trimester, fetus 1: Secondary | ICD-10-CM

## 2014-04-04 NOTE — [facility] Provider Note (Signed)
Attestation of Attending Supervision of Advanced Practitioner: Evaluation and management procedures were performed by the PA/NP/CNM/OB Fellow under my supervision/collaboration. Chart reviewed and agree with management and plan.  Nezzie Manera V 04/04/2014 4:52 PM

## 2014-04-07 ENCOUNTER — Other Ambulatory Visit: Payer: Self-pay | Admitting: Obstetrics & Gynecology

## 2014-04-07 ENCOUNTER — Ambulatory Visit (HOSPITAL_COMMUNITY)
Admission: RE | Admit: 2014-04-07 | Discharge: 2014-04-07 | Disposition: A | Discharge status: Home / Self Care | Payer: Medicaid Other | Source: Ambulatory Visit | Attending: Family Medicine | Admitting: Family Medicine

## 2014-04-07 DIAGNOSIS — O289 Unspecified abnormal findings on antenatal screening of mother: Secondary | ICD-10-CM | POA: Diagnosis not present

## 2014-04-07 DIAGNOSIS — O365990 Maternal care for other known or suspected poor fetal growth, unspecified trimester, not applicable or unspecified: Secondary | ICD-10-CM | POA: Diagnosis not present

## 2014-04-07 DIAGNOSIS — IMO0002 Reserved for concepts with insufficient information to code with codable children: Secondary | ICD-10-CM | POA: Diagnosis not present

## 2014-04-07 DIAGNOSIS — O365931 Maternal care for other known or suspected poor fetal growth, third trimester, fetus 1: Secondary | ICD-10-CM

## 2014-04-07 DIAGNOSIS — O358XX0 Maternal care for other (suspected) fetal abnormality and damage, not applicable or unspecified: Secondary | ICD-10-CM

## 2014-04-14 ENCOUNTER — Ambulatory Visit (INDEPENDENT_AMBULATORY_CARE_PROVIDER_SITE_OTHER): Payer: Medicaid Other | Admitting: Family Medicine

## 2014-04-14 VITALS — BP 114/71 | HR 85 | Temp 98.2°F | Wt 98.8 lb

## 2014-04-14 DIAGNOSIS — Z3400 Encounter for supervision of normal first pregnancy, unspecified trimester: Secondary | ICD-10-CM

## 2014-04-14 DIAGNOSIS — Z3401 Encounter for supervision of normal first pregnancy, first trimester: Secondary | ICD-10-CM

## 2014-04-14 LAB — POCT URINALYSIS DIP (DEVICE)
BILIRUBIN URINE: NEGATIVE
Glucose, UA: NEGATIVE mg/dL
HGB URINE DIPSTICK: NEGATIVE
KETONES UR: NEGATIVE mg/dL
Leukocytes, UA: NEGATIVE
Nitrite: NEGATIVE
Protein, ur: NEGATIVE mg/dL
Specific Gravity, Urine: 1.01 (ref 1.005–1.030)
Urobilinogen, UA: 0.2 mg/dL (ref 0.0–1.0)
pH: 7.5 (ref 5.0–8.0)

## 2014-04-14 NOTE — Progress Notes (Signed)
Reports intermittent pelvic pressure and occasional contractions.

## 2014-04-14 NOTE — Patient Instructions (Signed)
Third Trimester of Pregnancy The third trimester is from week 29 through week 42, months 7 through 9. The third trimester is a time when the fetus is growing rapidly. At the end of the ninth month, the fetus is about 20 inches in length and weighs 6-10 pounds.  BODY CHANGES Your body goes through many changes during pregnancy. The changes vary from woman to woman.   Your weight will continue to increase. You can expect to gain 25-35 pounds (11-16 kg) by the end of the pregnancy.  You may begin to get stretch marks on your hips, abdomen, and breasts.  You may urinate more often because the fetus is moving lower into your pelvis and pressing on your bladder.  You may develop or continue to have heartburn as a result of your pregnancy.  You may develop constipation because certain hormones are causing the muscles that push waste through your intestines to slow down.  You may develop hemorrhoids or swollen, bulging veins (varicose veins).  You may have pelvic pain because of the weight gain and pregnancy hormones relaxing your joints between the bones in your pelvis. Backaches may result from overexertion of the muscles supporting your posture.  You may have changes in your hair. These can include thickening of your hair, rapid growth, and changes in texture. Some women also have hair loss during or after pregnancy, or hair that feels dry or thin. Your hair will most likely return to normal after your baby is born.  Your breasts will continue to grow and be tender. A yellow discharge may leak from your breasts called colostrum.  Your belly button may stick out.  You may feel short of breath because of your expanding uterus.  You may notice the fetus "dropping," or moving lower in your abdomen.  You may have a bloody mucus discharge. This usually occurs a few days to a week before labor begins.  Your cervix becomes thin and soft (effaced) near your due date. WHAT TO EXPECT AT YOUR PRENATAL  EXAMS  You will have prenatal exams every 2 weeks until week 36. Then, you will have weekly prenatal exams. During a routine prenatal visit:  You will be weighed to make sure you and the fetus are growing normally.  Your blood pressure is taken.  Your abdomen will be measured to track your baby's growth.  The fetal heartbeat will be listened to.  Any test results from the previous visit will be discussed.  You may have a cervical check near your due date to see if you have effaced. At around 36 weeks, your caregiver will check your cervix. At the same time, your caregiver will also perform a test on the secretions of the vaginal tissue. This test is to determine if a type of bacteria, Group B streptococcus, is present. Your caregiver will explain this further. Your caregiver may ask you:  What your birth plan is.  How you are feeling.  If you are feeling the baby move.  If you have had any abnormal symptoms, such as leaking fluid, bleeding, severe headaches, or abdominal cramping.  If you have any questions. Other tests or screenings that may be performed during your third trimester include:  Blood tests that check for low iron levels (anemia).  Fetal testing to check the health, activity level, and growth of the fetus. Testing is done if you have certain medical conditions or if there are problems during the pregnancy. FALSE LABOR You may feel small, irregular contractions that   eventually go away. These are called Braxton Hicks contractions, or false labor. Contractions may last for hours, days, or even weeks before true labor sets in. If contractions come at regular intervals, intensify, or become painful, it is best to be seen by your caregiver.  SIGNS OF LABOR   Menstrual-like cramps.  Contractions that are 5 minutes apart or less.  Contractions that start on the top of the uterus and spread down to the lower abdomen and back.  A sense of increased pelvic pressure or back  pain.  A watery or bloody mucus discharge that comes from the vagina. If you have any of these signs before the 37th week of pregnancy, call your caregiver right away. You need to go to the hospital to get checked immediately. HOME CARE INSTRUCTIONS   Avoid all smoking, herbs, alcohol, and unprescribed drugs. These chemicals affect the formation and growth of the baby.  Follow your caregiver's instructions regarding medicine use. There are medicines that are either safe or unsafe to take during pregnancy.  Exercise only as directed by your caregiver. Experiencing uterine cramps is a good sign to stop exercising.  Continue to eat regular, healthy meals.  Wear a good support bra for breast tenderness.  Do not use hot tubs, steam rooms, or saunas.  Wear your seat belt at all times when driving.  Avoid raw meat, uncooked cheese, cat litter boxes, and soil used by cats. These carry germs that can cause birth defects in the baby.  Take your prenatal vitamins.  Try taking a stool softener (if your caregiver approves) if you develop constipation. Eat more high-fiber foods, such as fresh vegetables or fruit and whole grains. Drink plenty of fluids to keep your urine clear or pale yellow.  Take warm sitz baths to soothe any pain or discomfort caused by hemorrhoids. Use hemorrhoid cream if your caregiver approves.  If you develop varicose veins, wear support hose. Elevate your feet for 15 minutes, 3-4 times a day. Limit salt in your diet.  Avoid heavy lifting, wear low heal shoes, and practice good posture.  Rest a lot with your legs elevated if you have leg cramps or low back pain.  Visit your dentist if you have not gone during your pregnancy. Use a soft toothbrush to brush your teeth and be gentle when you floss.  A sexual relationship may be continued unless your caregiver directs you otherwise.  Do not travel far distances unless it is absolutely necessary and only with the approval  of your caregiver.  Take prenatal classes to understand, practice, and ask questions about the labor and delivery.  Make a trial run to the hospital.  Pack your hospital bag.  Prepare the baby's nursery.  Continue to go to all your prenatal visits as directed by your caregiver. SEEK MEDICAL CARE IF:  You are unsure if you are in labor or if your water has broken.  You have dizziness.  You have mild pelvic cramps, pelvic pressure, or nagging pain in your abdominal area.  You have persistent nausea, vomiting, or diarrhea.  You have a bad smelling vaginal discharge.  You have pain with urination. SEEK IMMEDIATE MEDICAL CARE IF:   You have a fever.  You are leaking fluid from your vagina.  You have spotting or bleeding from your vagina.  You have severe abdominal cramping or pain.  You have rapid weight loss or gain.  You have shortness of breath with chest pain.  You notice sudden or extreme swelling   of your face, hands, ankles, feet, or legs.  You have not felt your baby move in over an hour.  You have severe headaches that do not go away with medicine.  You have vision changes. Document Released: 07/02/2001 Document Revised: 07/13/2013 Document Reviewed: 09/08/2012 Exit[employer] Patient Information 2015 [employer]. This information is not intended to replace advice given to you by your health care provider. Make sure you discuss any questions you have with your health care provider.  

## 2014-04-14 NOTE — Progress Notes (Signed)
[place] last week shows good growth - 23%tile.  AC small - <3rd %.  Continue with serial [place] for growth. Denies contractions, bleeding, spotting, leaking fluid.  Good fetal activity.

## 2014-04-15 NOTE — [facility] Provider Note (Signed)
`````  Attestation of Attending Supervision of Advanced Practitioner: Evaluation and management procedures were performed by the PA/NP/CNM/OB Fellow under my supervision/collaboration. Chart reviewed and agree with management and plan.  Chanette Demo V 04/15/2014 8:31 AM

## 2014-04-22 ENCOUNTER — Other Ambulatory Visit: Payer: Self-pay | Admitting: Obstetrics & Gynecology

## 2014-04-22 DIAGNOSIS — O0990 Supervision of high risk pregnancy, unspecified, unspecified trimester: Secondary | ICD-10-CM

## 2014-04-22 DIAGNOSIS — O09899 Supervision of other high risk pregnancies, unspecified trimester: Secondary | ICD-10-CM

## 2014-04-22 DIAGNOSIS — O365930 Maternal care for other known or suspected poor fetal growth, third trimester, not applicable or unspecified: Secondary | ICD-10-CM

## 2014-04-22 DIAGNOSIS — IMO0002 Reserved for concepts with insufficient information to code with codable children: Secondary | ICD-10-CM

## 2014-04-22 DIAGNOSIS — O281 Abnormal biochemical finding on antenatal screening of mother: Secondary | ICD-10-CM

## 2014-04-22 DIAGNOSIS — O289 Unspecified abnormal findings on antenatal screening of mother: Secondary | ICD-10-CM

## 2014-04-22 DIAGNOSIS — O359XX1 Maternal care for (suspected) fetal abnormality and damage, unspecified, fetus 1: Secondary | ICD-10-CM

## 2014-04-28 ENCOUNTER — Ambulatory Visit (HOSPITAL_COMMUNITY)
Admission: RE | Admit: 2014-04-28 | Discharge: 2014-04-28 | Disposition: A | Discharge status: Home / Self Care | Payer: Medicaid Other | Source: Ambulatory Visit | Attending: Obstetrics & Gynecology | Admitting: Obstetrics & Gynecology

## 2014-04-28 ENCOUNTER — Ambulatory Visit (INDEPENDENT_AMBULATORY_CARE_PROVIDER_SITE_OTHER): Payer: Medicaid Other | Admitting: Obstetrics & Gynecology

## 2014-04-28 VITALS — BP 119/81 | HR 98 | Wt 102.2 lb

## 2014-04-28 VITALS — BP 117/76 | HR 98 | Temp 97.9°F | Wt 101.9 lb

## 2014-04-28 DIAGNOSIS — O281 Abnormal biochemical finding on antenatal screening of mother: Secondary | ICD-10-CM | POA: Diagnosis not present

## 2014-04-28 DIAGNOSIS — Z3A00 Weeks of gestation of pregnancy not specified: Secondary | ICD-10-CM | POA: Insufficient documentation

## 2014-04-28 DIAGNOSIS — O365930 Maternal care for other known or suspected poor fetal growth, third trimester, not applicable or unspecified: Secondary | ICD-10-CM | POA: Diagnosis not present

## 2014-04-28 DIAGNOSIS — O30003 Twin pregnancy, unspecified number of placenta and unspecified number of amniotic sacs, third trimester: Secondary | ICD-10-CM | POA: Insufficient documentation

## 2014-04-28 DIAGNOSIS — O3113X1 Continuing pregnancy after spontaneous abortion of one fetus or more, third trimester, fetus 1: Secondary | ICD-10-CM | POA: Diagnosis not present

## 2014-04-28 DIAGNOSIS — O289 Unspecified abnormal findings on antenatal screening of mother: Secondary | ICD-10-CM

## 2014-04-28 DIAGNOSIS — O09899 Supervision of other high risk pregnancies, unspecified trimester: Secondary | ICD-10-CM

## 2014-04-28 DIAGNOSIS — O280 Abnormal hematological finding on antenatal screening of mother: Secondary | ICD-10-CM

## 2014-04-28 DIAGNOSIS — Z3403 Encounter for supervision of normal first pregnancy, third trimester: Secondary | ICD-10-CM

## 2014-04-28 DIAGNOSIS — IMO0002 Reserved for concepts with insufficient information to code with codable children: Secondary | ICD-10-CM

## 2014-04-28 DIAGNOSIS — Z3401 Encounter for supervision of normal first pregnancy, first trimester: Secondary | ICD-10-CM

## 2014-04-28 DIAGNOSIS — O359XX1 Maternal care for (suspected) fetal abnormality and damage, unspecified, fetus 1: Secondary | ICD-10-CM

## 2014-04-28 DIAGNOSIS — O0990 Supervision of high risk pregnancy, unspecified, unspecified trimester: Secondary | ICD-10-CM

## 2014-04-28 LAB — POCT URINALYSIS DIP (DEVICE)
Bilirubin Urine: NEGATIVE
Glucose, UA: NEGATIVE mg/dL
Hgb urine dipstick: NEGATIVE
KETONES UR: NEGATIVE mg/dL
Leukocytes, UA: NEGATIVE
Nitrite: NEGATIVE
PH: 7 (ref 5.0–8.0)
PROTEIN: NEGATIVE mg/dL
SPECIFIC GRAVITY, URINE: 1.01 (ref 1.005–1.030)
Urobilinogen, UA: 0.2 mg/dL (ref 0.0–1.0)

## 2014-04-28 MED ORDER — FAMOTIDINE 40 MG PO TABS: 40.0000 mg | ORAL_TABLET | Freq: Every evening | ORAL | Status: DC

## 2014-04-28 NOTE — Progress Notes (Signed)
Patient reports a lot of pelvic pressure at times

## 2014-04-28 NOTE — Patient Instructions (Signed)
Heartburn During Pregnancy  °Heartburn is a burning sensation in the chest caused by stomach acid backing up into the esophagus. Heartburn is common in pregnancy because a certain hormone (progesterone) is released when a woman is pregnant. The progesterone hormone may relax the valve that separates the esophagus from the stomach. This allows acid to go up into the esophagus, causing heartburn. Heartburn may also happen in pregnancy because the enlarging uterus pushes up on the stomach, which pushes more acid into the esophagus. This is especially true in the later stages of pregnancy. Heartburn problems usually go away after giving birth. °CAUSES  °Heartburn is caused by stomach acid backing up into the esophagus. During pregnancy, this may result from various things, including:  °· The progesterone hormone. °· Changing hormone levels. °· The growing uterus pushing stomach acid upward. °· Large meals. °· Certain foods and drinks. °· Exercise. °· Increased acid production. °SIGNS AND SYMPTOMS  °· Burning pain in the chest or lower throat. °· Bitter taste in the mouth. °· Coughing. °DIAGNOSIS  °Your health care provider will typically diagnose heartburn by taking a careful history of your concern. Blood tests may be done to check for a certain type of bacteria that is associated with heartburn. Sometimes, heartburn is diagnosed by prescribing a heartburn medicine to see if the symptoms improve. In some cases, a procedure called an endoscopy may be done. In this procedure, a tube with a light and a camera on the end (endoscope) is used to examine the esophagus and the stomach. °TREATMENT  °Treatment will vary depending on the severity of your symptoms. Your health care provider may recommend: °· Over-the-counter medicines (antacids, acid reducers) for mild heartburn. °· Prescription medicines to decrease stomach acid or to protect your stomach lining. °· Certain changes in your diet. °· Elevating the head of your bed  by putting blocks under the legs. This helps prevent stomach acid from backing up into the esophagus when you are lying down. °HOME CARE INSTRUCTIONS  °· Only take over-the-counter or prescription medicines as directed by your health care provider. °· Raise the head of your bed by putting blocks under the legs if instructed to do so by your health care provider. Sleeping with more pillows is not effective because it only changes the position of your head. °· Do not exercise right after eating. °· Avoid eating 2-3 hours before bed. Do not lie down right after eating. °· Eat small meals throughout the day instead of three large meals. °· Identify foods and beverages that make your symptoms worse and avoid them. Foods you may want to avoid include: °¨ Peppers. °¨ Chocolate. °¨ High-fat foods, including fried foods. °¨ Spicy foods. °¨ Garlic and onions. °¨ Citrus fruits, including oranges, grapefruit, lemons, and limes. °¨ Food containing tomatoes or tomato products. °¨ Mint. °¨ Carbonated and caffeinated drinks. °¨ Vinegar. °SEEK MEDICAL CARE IF: °· You have abdominal pain of any kind. °· You feel burning in your upper abdomen or chest, especially after eating or lying down. °· You have nausea and vomiting. °· Your stomach feels upset after you eat. °SEEK IMMEDIATE MEDICAL CARE IF:  °· You have severe chest pain that goes down your arm or into your jaw or neck. °· You feel sweaty, dizzy, or light-headed. °· You become short of breath. °· You vomit blood. °· You have difficulty or pain with swallowing. °· You have bloody or black, tarry stools. °· You have episodes of heartburn more than 3 times a   week, for more than 2 weeks. °MAKE SURE YOU: °· Understand these instructions. °· Will watch your condition. °· Will get help right away if you are not doing well or get worse. °Document Released: 07/05/2000 Document Revised: 07/13/2013 Document Reviewed: 02/24/2013 °ExitCare® Patient Information ©2015 [employer]. This  information is not intended to replace advice given to you by your health care provider. Make sure you discuss any questions you have with your health care provider. ° °

## 2014-04-28 NOTE — Progress Notes (Signed)
[place] scheduled today for growth. C/O heartburn, will start Pepcid 40 mg daily

## 2014-05-05 ENCOUNTER — Ambulatory Visit (HOSPITAL_COMMUNITY)
Admission: RE | Admit: 2014-05-05 | Discharge: 2014-05-05 | Disposition: A | Discharge status: Home / Self Care | Payer: Medicaid Other | Source: Ambulatory Visit | Attending: Family Medicine | Admitting: Family Medicine

## 2014-05-05 DIAGNOSIS — O365930 Maternal care for other known or suspected poor fetal growth, third trimester, not applicable or unspecified: Secondary | ICD-10-CM | POA: Insufficient documentation

## 2014-05-05 DIAGNOSIS — Z3A00 Weeks of gestation of pregnancy not specified: Secondary | ICD-10-CM | POA: Insufficient documentation

## 2014-05-06 ENCOUNTER — Other Ambulatory Visit: Payer: Self-pay | Admitting: Obstetrics & Gynecology

## 2014-05-06 DIAGNOSIS — IMO0002 Reserved for concepts with insufficient information to code with codable children: Secondary | ICD-10-CM

## 2014-05-06 DIAGNOSIS — O359XX0 Maternal care for (suspected) fetal abnormality and damage, unspecified, not applicable or unspecified: Secondary | ICD-10-CM

## 2014-05-06 DIAGNOSIS — Z3A34 34 weeks gestation of pregnancy: Secondary | ICD-10-CM

## 2014-05-06 DIAGNOSIS — O289 Unspecified abnormal findings on antenatal screening of mother: Secondary | ICD-10-CM

## 2014-05-11 ENCOUNTER — Encounter: Payer: Self-pay | Admitting: *Deleted

## 2014-05-11 DIAGNOSIS — O365990 Maternal care for other known or suspected poor fetal growth, unspecified trimester, not applicable or unspecified: Secondary | ICD-10-CM | POA: Insufficient documentation

## 2014-05-12 ENCOUNTER — Ambulatory Visit (INDEPENDENT_AMBULATORY_CARE_PROVIDER_SITE_OTHER): Payer: Medicaid Other | Admitting: Family Medicine

## 2014-05-12 ENCOUNTER — Ambulatory Visit (HOSPITAL_COMMUNITY)
Admission: RE | Admit: 2014-05-12 | Discharge: 2014-05-12 | Disposition: A | Discharge status: Home / Self Care | Payer: Medicaid Other | Source: Ambulatory Visit | Attending: Family Medicine | Admitting: Family Medicine

## 2014-05-12 ENCOUNTER — Encounter: Payer: Self-pay | Admitting: Family Medicine

## 2014-05-12 VITALS — BP 118/76 | HR 93 | Temp 98.0°F | Wt 104.3 lb

## 2014-05-12 VITALS — BP 119/75 | HR 90 | Wt 103.5 lb

## 2014-05-12 DIAGNOSIS — O365930 Maternal care for other known or suspected poor fetal growth, third trimester, not applicable or unspecified: Secondary | ICD-10-CM | POA: Diagnosis not present

## 2014-05-12 DIAGNOSIS — O358XX0 Maternal care for other (suspected) fetal abnormality and damage, not applicable or unspecified: Secondary | ICD-10-CM | POA: Insufficient documentation

## 2014-05-12 DIAGNOSIS — O30003 Twin pregnancy, unspecified number of placenta and unspecified number of amniotic sacs, third trimester: Secondary | ICD-10-CM | POA: Insufficient documentation

## 2014-05-12 DIAGNOSIS — O3110X0 Continuing pregnancy after spontaneous abortion of one fetus or more, unspecified trimester, not applicable or unspecified: Secondary | ICD-10-CM

## 2014-05-12 DIAGNOSIS — Z3A34 34 weeks gestation of pregnancy: Secondary | ICD-10-CM | POA: Insufficient documentation

## 2014-05-12 DIAGNOSIS — IMO0002 Reserved for concepts with insufficient information to code with codable children: Secondary | ICD-10-CM

## 2014-05-12 DIAGNOSIS — O289 Unspecified abnormal findings on antenatal screening of mother: Secondary | ICD-10-CM

## 2014-05-12 DIAGNOSIS — O3113X1 Continuing pregnancy after spontaneous abortion of one fetus or more, third trimester, fetus 1: Secondary | ICD-10-CM | POA: Diagnosis not present

## 2014-05-12 DIAGNOSIS — O30009 Twin pregnancy, unspecified number of placenta and unspecified number of amniotic sacs, unspecified trimester: Secondary | ICD-10-CM

## 2014-05-12 DIAGNOSIS — Z3403 Encounter for supervision of normal first pregnancy, third trimester: Secondary | ICD-10-CM

## 2014-05-12 DIAGNOSIS — O365931 Maternal care for other known or suspected poor fetal growth, third trimester, fetus 1: Secondary | ICD-10-CM

## 2014-05-12 DIAGNOSIS — O359XX0 Maternal care for (suspected) fetal abnormality and damage, unspecified, not applicable or unspecified: Secondary | ICD-10-CM

## 2014-05-12 LAB — POCT URINALYSIS DIP (DEVICE)
Bilirubin Urine: NEGATIVE
GLUCOSE, UA: NEGATIVE mg/dL
HGB URINE DIPSTICK: NEGATIVE
KETONES UR: NEGATIVE mg/dL
Leukocytes, UA: NEGATIVE
Nitrite: NEGATIVE
Protein, ur: NEGATIVE mg/dL
UROBILINOGEN UA: 0.2 mg/dL (ref 0.0–1.0)
pH: 6 (ref 5.0–8.0)

## 2014-05-12 NOTE — Patient Instructions (Signed)
Third Trimester of Pregnancy The third trimester is from week 29 through week 42, months 7 through 9. This trimester is when your unborn baby (fetus) is growing very fast. At the end of the ninth month, the unborn baby is about 20 inches in length. It weighs about 6-10 pounds.  HOME CARE   Avoid all smoking, herbs, and alcohol. Avoid drugs not approved by your doctor.  Only take medicine as told by your doctor. Some medicines are safe and some are not during pregnancy.  Exercise only as told by your doctor. Stop exercising if you start having cramps.  Eat regular, healthy meals.  Wear a good support bra if your breasts are tender.  Do not use hot tubs, steam rooms, or saunas.  Wear your seat belt when driving.  Avoid raw meat, uncooked cheese, and liter boxes and soil used by cats.  Take your prenatal vitamins.  Try taking medicine that helps you poop (stool softener) as needed, and if your doctor approves. Eat more fiber by eating fresh fruit, vegetables, and whole grains. Drink enough fluids to keep your pee (urine) clear or pale yellow.  Take warm water baths (sitz baths) to soothe pain or discomfort caused by hemorrhoids. Use hemorrhoid cream if your doctor approves.  If you have puffy, bulging veins (varicose veins), wear support hose. Raise (elevate) your feet for 15 minutes, 3-4 times a day. Limit salt in your diet.  Avoid heavy lifting, wear low heels, and sit up straight.  Rest with your legs raised if you have leg cramps or low back pain.  Visit your dentist if you have not gone during your pregnancy. Use a soft toothbrush to brush your teeth. Be gentle when you floss.  You can have sex (intercourse) unless your doctor tells you not to.  Do not travel far distances unless you must. Only do so with your doctor's approval.  Take prenatal classes.  Practice driving to the hospital.  Pack your hospital bag.  Prepare the baby's room.  Go to your doctor visits. GET  HELP IF:  You are not sure if you are in labor or if your water has broken.  You are dizzy.  You have mild cramps or pressure in your lower belly (abdominal).  You have a nagging pain in your belly area.  You continue to feel sick to your stomach (nauseous), throw up (vomit), or have watery poop (diarrhea).  You have bad smelling fluid coming from your vagina.  You have pain with peeing (urination). GET HELP RIGHT AWAY IF:   You have a fever.  You are leaking fluid from your vagina.  You are spotting or bleeding from your vagina.  You have severe belly cramping or pain.  You lose or gain weight rapidly.  You have trouble catching your breath and have chest pain.  You notice sudden or extreme puffiness (swelling) of your face, hands, ankles, feet, or legs.  You have not felt the baby move in over an hour.  You have severe headaches that do not go away with medicine.  You have vision changes. Document Released: 10/02/2009 Document Revised: 11/02/2012 Document Reviewed: 09/08/2012 Exit[employer] Patient Information 2015 [employer]. This information is not intended to replace advice given to you by your health care provider. Make sure you discuss any questions you have with your health care provider.  

## 2014-05-12 NOTE — Progress Notes (Signed)
Has BPP today.  Growth [place] next week.  Good fetal activity. Occasional ctx.  No leaking fluid, bleeding.

## 2014-05-16 ENCOUNTER — Other Ambulatory Visit: Payer: Self-pay | Admitting: Obstetrics & Gynecology

## 2014-05-16 DIAGNOSIS — O289 Unspecified abnormal findings on antenatal screening of mother: Secondary | ICD-10-CM

## 2014-05-16 DIAGNOSIS — Z3A35 35 weeks gestation of pregnancy: Secondary | ICD-10-CM

## 2014-05-16 DIAGNOSIS — O365930 Maternal care for other known or suspected poor fetal growth, third trimester, not applicable or unspecified: Secondary | ICD-10-CM

## 2014-05-16 DIAGNOSIS — O359XX0 Maternal care for (suspected) fetal abnormality and damage, unspecified, not applicable or unspecified: Secondary | ICD-10-CM

## 2014-05-18 ENCOUNTER — Other Ambulatory Visit: Payer: Self-pay | Admitting: Obstetrics & Gynecology

## 2014-05-18 ENCOUNTER — Ambulatory Visit (HOSPITAL_COMMUNITY)
Admission: RE | Admit: 2014-05-18 | Discharge: 2014-05-18 | Disposition: A | Discharge status: Home / Self Care | Payer: Medicaid Other | Source: Ambulatory Visit | Attending: Family Medicine | Admitting: Family Medicine

## 2014-05-18 ENCOUNTER — Encounter (HOSPITAL_COMMUNITY): Payer: Self-pay

## 2014-05-18 VITALS — BP 124/77 | HR 91 | Wt 103.0 lb

## 2014-05-18 DIAGNOSIS — O289 Unspecified abnormal findings on antenatal screening of mother: Secondary | ICD-10-CM

## 2014-05-18 DIAGNOSIS — O358XX0 Maternal care for other (suspected) fetal abnormality and damage, not applicable or unspecified: Secondary | ICD-10-CM | POA: Diagnosis not present

## 2014-05-18 DIAGNOSIS — O359XX0 Maternal care for (suspected) fetal abnormality and damage, unspecified, not applicable or unspecified: Secondary | ICD-10-CM

## 2014-05-18 DIAGNOSIS — Z3A35 35 weeks gestation of pregnancy: Secondary | ICD-10-CM

## 2014-05-18 DIAGNOSIS — O3113X1 Continuing pregnancy after spontaneous abortion of one fetus or more, third trimester, fetus 1: Secondary | ICD-10-CM | POA: Diagnosis not present

## 2014-05-18 DIAGNOSIS — O280 Abnormal hematological finding on antenatal screening of mother: Secondary | ICD-10-CM

## 2014-05-18 DIAGNOSIS — IMO0002 Reserved for concepts with insufficient information to code with codable children: Secondary | ICD-10-CM

## 2014-05-18 DIAGNOSIS — O365933 Maternal care for other known or suspected poor fetal growth, third trimester, fetus 3: Secondary | ICD-10-CM

## 2014-05-18 DIAGNOSIS — O365930 Maternal care for other known or suspected poor fetal growth, third trimester, not applicable or unspecified: Secondary | ICD-10-CM

## 2014-05-18 DIAGNOSIS — O30003 Twin pregnancy, unspecified number of placenta and unspecified number of amniotic sacs, third trimester: Secondary | ICD-10-CM | POA: Insufficient documentation

## 2014-05-19 ENCOUNTER — Ambulatory Visit (HOSPITAL_COMMUNITY): Payer: Medicaid Other

## 2014-05-23 ENCOUNTER — Encounter (HOSPITAL_COMMUNITY): Payer: Self-pay

## 2014-05-25 ENCOUNTER — Other Ambulatory Visit: Payer: Self-pay | Admitting: Obstetrics & Gynecology

## 2014-05-25 DIAGNOSIS — O359XX0 Maternal care for (suspected) fetal abnormality and damage, unspecified, not applicable or unspecified: Secondary | ICD-10-CM

## 2014-05-25 DIAGNOSIS — IMO0002 Reserved for concepts with insufficient information to code with codable children: Secondary | ICD-10-CM

## 2014-05-25 DIAGNOSIS — Z3A36 36 weeks gestation of pregnancy: Secondary | ICD-10-CM

## 2014-05-25 DIAGNOSIS — O289 Unspecified abnormal findings on antenatal screening of mother: Secondary | ICD-10-CM

## 2014-05-26 ENCOUNTER — Ambulatory Visit (INDEPENDENT_AMBULATORY_CARE_PROVIDER_SITE_OTHER): Payer: Medicaid Other | Admitting: Obstetrics & Gynecology

## 2014-05-26 ENCOUNTER — Telehealth (HOSPITAL_COMMUNITY): Payer: Self-pay | Admitting: *Deleted

## 2014-05-26 ENCOUNTER — Ambulatory Visit (HOSPITAL_COMMUNITY): Payer: Medicaid Other

## 2014-05-26 ENCOUNTER — Ambulatory Visit (HOSPITAL_COMMUNITY)
Admission: RE | Admit: 2014-05-26 | Discharge: 2014-05-26 | Disposition: A | Discharge status: Home / Self Care | Payer: Medicaid Other | Source: Ambulatory Visit | Attending: Obstetrics & Gynecology | Admitting: Obstetrics & Gynecology

## 2014-05-26 ENCOUNTER — Other Ambulatory Visit: Payer: Self-pay | Admitting: Obstetrics & Gynecology

## 2014-05-26 VITALS — BP 124/78 | HR 109 | Temp 98.3°F | Wt 104.6 lb

## 2014-05-26 DIAGNOSIS — O280 Abnormal hematological finding on antenatal screening of mother: Secondary | ICD-10-CM | POA: Insufficient documentation

## 2014-05-26 DIAGNOSIS — O30003 Twin pregnancy, unspecified number of placenta and unspecified number of amniotic sacs, third trimester: Secondary | ICD-10-CM | POA: Diagnosis not present

## 2014-05-26 DIAGNOSIS — Z3A36 36 weeks gestation of pregnancy: Secondary | ICD-10-CM

## 2014-05-26 DIAGNOSIS — O3113X1 Continuing pregnancy after spontaneous abortion of one fetus or more, third trimester, fetus 1: Secondary | ICD-10-CM | POA: Insufficient documentation

## 2014-05-26 DIAGNOSIS — O365930 Maternal care for other known or suspected poor fetal growth, third trimester, not applicable or unspecified: Secondary | ICD-10-CM | POA: Diagnosis not present

## 2014-05-26 DIAGNOSIS — O359XX0 Maternal care for (suspected) fetal abnormality and damage, unspecified, not applicable or unspecified: Secondary | ICD-10-CM

## 2014-05-26 DIAGNOSIS — O289 Unspecified abnormal findings on antenatal screening of mother: Secondary | ICD-10-CM

## 2014-05-26 DIAGNOSIS — O365931 Maternal care for other known or suspected poor fetal growth, third trimester, fetus 1: Secondary | ICD-10-CM

## 2014-05-26 DIAGNOSIS — O358XX0 Maternal care for other (suspected) fetal abnormality and damage, not applicable or unspecified: Secondary | ICD-10-CM | POA: Insufficient documentation

## 2014-05-26 DIAGNOSIS — IMO0002 Reserved for concepts with insufficient information to code with codable children: Secondary | ICD-10-CM

## 2014-05-26 DIAGNOSIS — O0993 Supervision of high risk pregnancy, unspecified, third trimester: Secondary | ICD-10-CM

## 2014-05-26 LAB — POCT URINALYSIS DIP (DEVICE)
BILIRUBIN URINE: NEGATIVE
Glucose, UA: NEGATIVE mg/dL
Hgb urine dipstick: NEGATIVE
Ketones, ur: NEGATIVE mg/dL
Leukocytes, UA: NEGATIVE
Nitrite: NEGATIVE
Protein, ur: NEGATIVE mg/dL
SPECIFIC GRAVITY, URINE: 1.015 (ref 1.005–1.030)
Urobilinogen, UA: 0.2 mg/dL (ref 0.0–1.0)
pH: 7 (ref 5.0–8.0)

## 2014-05-26 LAB — OB RESULTS CONSOLE GBS: GBS: NEGATIVE

## 2014-05-26 NOTE — Patient Instructions (Signed)
Induction of labor scheduled 06/04/14 at 7:30 pm. Nothing to eat after 12 pm on this day. Come to Maternity Admissions.  Fetal Movement Counts Patient Name: __________________________________________________ Patient Due Date: ____________________ Performing a fetal movement count is highly recommended in high-risk pregnancies, but it is good for every pregnant woman to do. Your health care provider may ask you to start counting fetal movements at 28 weeks of the pregnancy. Fetal movements often increase:  After eating a full meal.  After physical activity.  After eating or drinking something sweet or cold.  At rest. Pay attention to when you feel the baby is most active. This will help you notice a pattern of your baby's sleep and wake cycles and what factors contribute to an increase in fetal movement. It is important to perform a fetal movement count at the same time each day when your baby is normally most active.  HOW TO COUNT FETAL MOVEMENTS 1. Find a quiet and comfortable area to sit or lie down on your left side. Lying on your left side provides the best blood and oxygen circulation to your baby. 2. Write down the day and time on a sheet of paper or in a journal. 3. Start counting kicks, flutters, swishes, rolls, or jabs in a 2-hour period. You should feel at least 10 movements within 2 hours. 4. If you do not feel 10 movements in 2 hours, wait 2-3 hours and count again. Look for a change in the pattern or not enough counts in 2 hours. SEEK MEDICAL CARE IF:  You feel less than 10 counts in 2 hours, tried twice.  There is no movement in over an hour.  The pattern is changing or taking longer each day to reach 10 counts in 2 hours.  You feel the baby is not moving as he or she usually does. Date: ____________ Movements: ____________ Start time: ____________ Elizebeth Koller time: ____________  Date: ____________ Movements: ____________ Start time: ____________ Elizebeth Koller time: ____________ Date:  ____________ Movements: ____________ Start time: ____________ Elizebeth Koller time: ____________ Date: ____________ Movements: ____________ Start time: ____________ Elizebeth Koller time: ____________ Date: ____________ Movements: ____________ Start time: ____________ Elizebeth Koller time: ____________ Date: ____________ Movements: ____________ Start time: ____________ Elizebeth Koller time: ____________ Date: ____________ Movements: ____________ Start time: ____________ Elizebeth Koller time: ____________ Date: ____________ Movements: ____________ Start time: ____________ Elizebeth Koller time: ____________  Date: ____________ Movements: ____________ Start time: ____________ Elizebeth Koller time: ____________ Date: ____________ Movements: ____________ Start time: ____________ Elizebeth Koller time: ____________ Date: ____________ Movements: ____________ Start time: ____________ Elizebeth Koller time: ____________ Date: ____________ Movements: ____________ Start time: ____________ Elizebeth Koller time: ____________ Date: ____________ Movements: ____________ Start time: ____________ Elizebeth Koller time: ____________ Date: ____________ Movements: ____________ Start time: ____________ Elizebeth Koller time: ____________ Date: ____________ Movements: ____________ Start time: ____________ Elizebeth Koller time: ____________  Date: ____________ Movements: ____________ Start time: ____________ Elizebeth Koller time: ____________ Date: ____________ Movements: ____________ Start time: ____________ Elizebeth Koller time: ____________ Date: ____________ Movements: ____________ Start time: ____________ Elizebeth Koller time: ____________ Date: ____________ Movements: ____________ Start time: ____________ Elizebeth Koller time: ____________ Date: ____________ Movements: ____________ Start time: ____________ Elizebeth Koller time: ____________ Date: ____________ Movements: ____________ Start time: ____________ Elizebeth Koller time: ____________ Date: ____________ Movements: ____________ Start time: ____________ Elizebeth Koller time: ____________  Date: ____________ Movements: ____________ Start  time: ____________ Elizebeth Koller time: ____________ Date: ____________ Movements: ____________ Start time: ____________ Elizebeth Koller time: ____________ Date: ____________ Movements: ____________ Start time: ____________ Elizebeth Koller time: ____________ Date: ____________ Movements: ____________ Start time: ____________ Elizebeth Koller time: ____________ Date: ____________ Movements: ____________ Start time: ____________ Elizebeth Koller time: ____________ Date: ____________ Movements: ____________ Start time: ____________  Finish time: ____________ Date: ____________ Movements: ____________ Start time: ____________ Elizebeth Koller time: ____________  Date: ____________ Movements: ____________ Start time: ____________ Elizebeth Koller time: ____________ Date: ____________ Movements: ____________ Start time: ____________ Elizebeth Koller time: ____________ Date: ____________ Movements: ____________ Start time: ____________ Elizebeth Koller time: ____________ Date: ____________ Movements: ____________ Start time: ____________ Elizebeth Koller time: ____________ Date: ____________ Movements: ____________ Start time: ____________ Elizebeth Koller time: ____________ Date: ____________ Movements: ____________ Start time: ____________ Elizebeth Koller time: ____________ Date: ____________ Movements: ____________ Start time: ____________ Elizebeth Koller time: ____________  Date: ____________ Movements: ____________ Start time: ____________ Elizebeth Koller time: ____________ Date: ____________ Movements: ____________ Start time: ____________ Elizebeth Koller time: ____________ Date: ____________ Movements: ____________ Start time: ____________ Elizebeth Koller time: ____________ Date: ____________ Movements: ____________ Start time: ____________ Elizebeth Koller time: ____________ Date: ____________ Movements: ____________ Start time: ____________ Elizebeth Koller time: ____________ Date: ____________ Movements: ____________ Start time: ____________ Elizebeth Koller time: ____________ Date: ____________ Movements: ____________ Start time: ____________ Elizebeth Koller time: ____________   Date: ____________ Movements: ____________ Start time: ____________ Elizebeth Koller time: ____________ Date: ____________ Movements: ____________ Start time: ____________ Elizebeth Koller time: ____________ Date: ____________ Movements: ____________ Start time: ____________ Elizebeth Koller time: ____________ Date: ____________ Movements: ____________ Start time: ____________ Elizebeth Koller time: ____________ Date: ____________ Movements: ____________ Start time: ____________ Elizebeth Koller time: ____________ Date: ____________ Movements: ____________ Start time: ____________ Elizebeth Koller time: ____________ Date: ____________ Movements: ____________ Start time: ____________ Elizebeth Koller time: ____________  Date: ____________ Movements: ____________ Start time: ____________ Elizebeth Koller time: ____________ Date: ____________ Movements: ____________ Start time: ____________ Elizebeth Koller time: ____________ Date: ____________ Movements: ____________ Start time: ____________ Elizebeth Koller time: ____________ Date: ____________ Movements: ____________ Start time: ____________ Elizebeth Koller time: ____________ Date: ____________ Movements: ____________ Start time: ____________ Elizebeth Koller time: ____________ Date: ____________ Movements: ____________ Start time: ____________ Elizebeth Koller time: ____________ Document Released: 08/07/2006 Document Revised: 11/22/2013 Document Reviewed: 05/04/2012 [employer] Patient Information 2015 [employer]. This information is not intended to replace advice given to you by your health care provider. Make sure you discuss any questions you have with your health care provider.

## 2014-05-26 NOTE — Progress Notes (Signed)
GBS and cultures.

## 2014-05-26 NOTE — Telephone Encounter (Signed)
Preadmission screen  

## 2014-05-26 NOTE — Progress Notes (Signed)
05/16/14 [redacted]w[redacted]d EFW 1621g (3 lb 9 oz)/<10%, AFI 2.48 cm, cephalic, normal BPP, normal UA dopplers -> IUGR IOL at [redacted]w[redacted]d on 06/04/14 at 1930 - patient refused induction prior to 06/03/14 because of a cultural event scheduled on that day.  Counseled than IOL at [redacted]w[redacted]d was standard of care, emphasized risk of IUFD given IUGR.  Patient insisted on being scheduled no earlier than 06/04/14.  Will continue antenatal testing with MFM weekly as scheduled. Patient is aware of increased risk of needing cesarean section due to IUGR/placental insufficiency.  May consider CST prior to induction.  Patient instructed to be NPO except sips of clears after 12 noon on the day of induction. Pelvic cultures done today.  No other complaints or concerns.  Labor and strict fetal movement precautions reviewed.

## 2014-05-27 ENCOUNTER — Encounter (HOSPITAL_COMMUNITY): Payer: Self-pay | Admitting: *Deleted

## 2014-05-27 LAB — GC/CHLAMYDIA PROBE AMP
CT Probe RNA: NEGATIVE
GC Probe RNA: NEGATIVE

## 2014-05-28 LAB — CULTURE, BETA STREP (GROUP B ONLY)

## 2014-06-02 ENCOUNTER — Encounter (HOSPITAL_COMMUNITY): Payer: Self-pay

## 2014-06-02 ENCOUNTER — Ambulatory Visit (INDEPENDENT_AMBULATORY_CARE_PROVIDER_SITE_OTHER): Payer: Medicaid Other | Admitting: Family Medicine

## 2014-06-02 ENCOUNTER — Ambulatory Visit (HOSPITAL_COMMUNITY)
Admission: RE | Admit: 2014-06-02 | Discharge: 2014-06-02 | Disposition: A | Discharge status: Home / Self Care | Payer: Medicaid Other | Source: Ambulatory Visit | Attending: Family Medicine | Admitting: Family Medicine

## 2014-06-02 VITALS — BP 115/67 | HR 90 | Temp 98.3°F | Wt 105.2 lb

## 2014-06-02 DIAGNOSIS — O3113X0 Continuing pregnancy after spontaneous abortion of one fetus or more, third trimester, not applicable or unspecified: Secondary | ICD-10-CM | POA: Insufficient documentation

## 2014-06-02 DIAGNOSIS — O365930 Maternal care for other known or suspected poor fetal growth, third trimester, not applicable or unspecified: Secondary | ICD-10-CM | POA: Insufficient documentation

## 2014-06-02 DIAGNOSIS — O359XX0 Maternal care for (suspected) fetal abnormality and damage, unspecified, not applicable or unspecified: Secondary | ICD-10-CM

## 2014-06-02 DIAGNOSIS — O0993 Supervision of high risk pregnancy, unspecified, third trimester: Secondary | ICD-10-CM

## 2014-06-02 DIAGNOSIS — O30003 Twin pregnancy, unspecified number of placenta and unspecified number of amniotic sacs, third trimester: Secondary | ICD-10-CM | POA: Insufficient documentation

## 2014-06-02 DIAGNOSIS — Z3A36 36 weeks gestation of pregnancy: Secondary | ICD-10-CM | POA: Diagnosis not present

## 2014-06-02 DIAGNOSIS — O289 Unspecified abnormal findings on antenatal screening of mother: Secondary | ICD-10-CM

## 2014-06-02 DIAGNOSIS — O358XX0 Maternal care for other (suspected) fetal abnormality and damage, not applicable or unspecified: Secondary | ICD-10-CM | POA: Insufficient documentation

## 2014-06-02 DIAGNOSIS — Z3A37 37 weeks gestation of pregnancy: Secondary | ICD-10-CM | POA: Insufficient documentation

## 2014-06-02 DIAGNOSIS — IMO0002 Reserved for concepts with insufficient information to code with codable children: Secondary | ICD-10-CM

## 2014-06-02 LAB — POCT URINALYSIS DIP (DEVICE)
BILIRUBIN URINE: NEGATIVE
Glucose, UA: NEGATIVE mg/dL
HGB URINE DIPSTICK: NEGATIVE
KETONES UR: NEGATIVE mg/dL
Nitrite: NEGATIVE
PH: 7 (ref 5.0–8.0)
Protein, ur: NEGATIVE mg/dL
SPECIFIC GRAVITY, URINE: 1.02 (ref 1.005–1.030)
Urobilinogen, UA: 0.2 mg/dL (ref 0.0–1.0)

## 2014-06-02 NOTE — Patient Instructions (Signed)
Labor Induction  Labor induction is when steps are taken to cause a pregnant woman to begin the labor process. Most women go into labor on their own between 37 weeks and 42 weeks of the pregnancy. When this does not happen or when there is a medical need, methods may be used to induce labor. Labor induction causes a pregnant woman's uterus to contract. It also causes the cervix to soften (ripen), open (dilate), and thin out (efface). Usually, labor is not induced before 39 weeks of the pregnancy unless there is a problem with the baby or mother.  Before inducing labor, your health care provider will consider a number of factors, including the following:  The medical condition of you and the baby.   How many weeks along you are.   The status of the baby's lung maturity.   The condition of the cervix.   The position of the baby.  WHAT ARE THE REASONS FOR LABOR INDUCTION? Labor may be induced for the following reasons:  The health of the baby or mother is at risk.   The pregnancy is overdue by 1 week or more.   The water breaks but labor does not start on its own.   The mother has a health condition or serious illness, such as high blood pressure, infection, placental abruption, or diabetes.  The amniotic fluid amounts are low around the baby.   The baby is distressed.  Convenience or wanting the baby to be born on a certain date is not a reason for inducing labor. WHAT METHODS ARE USED FOR LABOR INDUCTION? Several methods of labor induction may be used, such as:   Prostaglandin medicine. This medicine causes the cervix to dilate and ripen. The medicine will also start contractions. It can be taken by mouth or by inserting a suppository into the vagina.   Inserting a thin tube (catheter) with a balloon on the end into the vagina to dilate the cervix. Once inserted, the balloon is expanded with water, which causes the cervix to open.   Stripping the membranes. Your health  care provider separates amniotic sac tissue from the cervix, causing the cervix to be stretched and causing the release of a hormone called progesterone. This may cause the uterus to contract. It is often done during an office visit. You will be sent home to wait for the contractions to begin. You will then come in for an induction.   Breaking the water. Your health care provider makes a hole in the amniotic sac using a small instrument. Once the amniotic sac breaks, contractions should begin. This may still take hours to see an effect.   Medicine to trigger or strengthen contractions. This medicine is given through an IV access tube inserted into a vein in your arm.  All of the methods of induction, besides stripping the membranes, will be done in the hospital. Induction is done in the hospital so that you and the baby can be carefully monitored.  HOW LONG DOES IT TAKE FOR LABOR TO BE INDUCED? Some inductions can take up to 2-3 days. Depending on the cervix, it usually takes less time. It takes longer when you are induced early in the pregnancy or if this is your first pregnancy. If a mother is still pregnant and the induction has been going on for 2-3 days, either the mother will be sent home or a cesarean delivery will be needed. WHAT ARE THE RISKS ASSOCIATED WITH LABOR INDUCTION? Some of the risks of induction   include:   Changes in fetal heart rate, such as too high, too low, or erratic.   Fetal distress.   Chance of infection for the mother and baby.   Increased chance of having a cesarean delivery.   Breaking off (abruption) of the placenta from the uterus (rare).   Uterine rupture (very rare).  When induction is needed for medical reasons, the benefits of induction may outweigh the risks. WHAT ARE SOME REASONS FOR NOT INDUCING LABOR? Labor induction should not be done if:   It is shown that your baby does not tolerate labor.   You have had previous surgeries on your  uterus, such as a myomectomy or the removal of fibroids.   Your placenta lies very low in the uterus and blocks the opening of the cervix (placenta previa).   Your baby is not in a head-down position.   The umbilical cord drops down into the birth canal in front of the baby. This could cut off the baby's blood and oxygen supply.   You have had a previous cesarean delivery.   There are unusual circumstances, such as the baby being extremely premature.  Document Released: 11/27/2006 Document Revised: 03/10/2013 Document Reviewed: 02/04/2013 [employer] Patient Information 2015 [employer]. This information is not intended to replace advice given to you by your health care provider. Make sure you discuss any questions you have with your health care provider.  

## 2014-06-02 NOTE — Progress Notes (Signed)
IOL on Saturday for IUGR.  Has BPP later today.  Occasional contraction, good fetal movement.  No other concerns.

## 2014-06-04 ENCOUNTER — Inpatient Hospital Stay (HOSPITAL_COMMUNITY)
Admission: RE | Admit: 2014-06-04 | Discharge: 2014-06-07 | DRG: 775 | Disposition: A | Discharge status: Home / Self Care | Payer: Medicaid Other | Source: Ambulatory Visit | Attending: Obstetrics & Gynecology | Admitting: Obstetrics & Gynecology

## 2014-06-04 ENCOUNTER — Encounter (HOSPITAL_COMMUNITY): Payer: Self-pay

## 2014-06-04 VITALS — BP 118/79 | HR 98 | Temp 98.2°F | Resp 18 | Ht <= 58 in | Wt 105.0 lb

## 2014-06-04 DIAGNOSIS — O365930 Maternal care for other known or suspected poor fetal growth, third trimester, not applicable or unspecified: Secondary | ICD-10-CM | POA: Diagnosis present

## 2014-06-04 DIAGNOSIS — O0992 Supervision of high risk pregnancy, unspecified, second trimester: Secondary | ICD-10-CM

## 2014-06-04 DIAGNOSIS — Z3A37 37 weeks gestation of pregnancy: Secondary | ICD-10-CM | POA: Diagnosis present

## 2014-06-04 DIAGNOSIS — Z888 Allergy status to other drugs, medicaments and biological substances status: Secondary | ICD-10-CM | POA: Diagnosis not present

## 2014-06-04 DIAGNOSIS — O365931 Maternal care for other known or suspected poor fetal growth, third trimester, fetus 1: Secondary | ICD-10-CM

## 2014-06-04 DIAGNOSIS — Z87898 Personal history of other specified conditions: Secondary | ICD-10-CM | POA: Diagnosis present

## 2014-06-04 LAB — CBC
HCT: 35.8 % — ABNORMAL LOW (ref 36.0–46.0)
HEMOGLOBIN: 12.1 g/dL (ref 12.0–15.0)
MCH: 33.3 pg (ref 26.0–34.0)
MCHC: 33.8 g/dL (ref 30.0–36.0)
MCV: 98.6 fL (ref 78.0–100.0)
PLATELETS: 132 10*3/uL — AB (ref 150–400)
RBC: 3.63 MIL/uL — ABNORMAL LOW (ref 3.87–5.11)
RDW: 12.6 % (ref 11.5–15.5)
WBC: 12.8 10*3/uL — ABNORMAL HIGH (ref 4.0–10.5)

## 2014-06-04 LAB — TYPE AND SCREEN
ABO/RH(D): AB POS
Antibody Screen: NEGATIVE

## 2014-06-04 MED ORDER — OXYTOCIN 40 UNITS IN LACTATED RINGERS INFUSION - SIMPLE MED
62.5000 mL/h | INTRAVENOUS | Status: DC
  Administered 2014-06-05: 62.5 mL/h via INTRAVENOUS

## 2014-06-04 MED ORDER — OXYCODONE-ACETAMINOPHEN 5-325 MG PO TABS: 1.0000 | ORAL_TABLET | ORAL | Status: DC | PRN

## 2014-06-04 MED ORDER — LIDOCAINE HCL (PF) 1 % IJ SOLN
30.0000 mL | INTRAMUSCULAR | Status: AC | PRN
  Administered 2014-06-05: 30 mL via SUBCUTANEOUS
  Filled 2014-06-04: qty 30

## 2014-06-04 MED ORDER — LACTATED RINGERS IV SOLN
INTRAVENOUS | Status: DC
  Administered 2014-06-05: 1000 mL via INTRAVENOUS

## 2014-06-04 MED ORDER — FLEET ENEMA 7-19 GM/118ML RE ENEM: 1.0000 | ENEMA | RECTAL | Status: DC | PRN

## 2014-06-04 MED ORDER — LACTATED RINGERS IV SOLN: 500.0000 mL | INTRAVENOUS | Status: DC | PRN

## 2014-06-04 MED ORDER — CITRIC ACID-SODIUM CITRATE 334-500 MG/5ML PO SOLN: 30.0000 mL | ORAL | Status: DC | PRN

## 2014-06-04 MED ORDER — OXYTOCIN BOLUS FROM INFUSION
500.0000 mL | INTRAVENOUS | Status: DC
  Administered 2014-06-05: 500 mL via INTRAVENOUS

## 2014-06-04 MED ORDER — ACETAMINOPHEN 325 MG PO TABS
650.0000 mg | ORAL_TABLET | ORAL | Status: DC | PRN
Start: 2014-06-04 — End: 2014-06-05

## 2014-06-04 MED ORDER — TERBUTALINE SULFATE 1 MG/ML IJ SOLN: 0.2500 mg | Freq: Once | INTRAMUSCULAR | Status: AC | PRN

## 2014-06-04 MED ORDER — MISOPROSTOL 25 MCG QUARTER TABLET
25.0000 ug | ORAL_TABLET | ORAL | Status: DC | PRN
  Administered 2014-06-04 – 2014-06-05 (×3): 25 ug via VAGINAL
  Filled 2014-06-04 (×2): qty 0.25
  Filled 2014-06-04: qty 1
  Filled 2014-06-04 (×2): qty 0.25

## 2014-06-04 MED ORDER — OXYCODONE-ACETAMINOPHEN 5-325 MG PO TABS: 2.0000 | ORAL_TABLET | ORAL | Status: DC | PRN

## 2014-06-04 NOTE — H&P (Signed)
Shelly Koch is a 23 y.o. female G1P0 with IUP at [redacted]w[redacted]d presenting for IOL 2/2 IUGR. Patient denies contractions, loss of fluid, and vaginal bleeding. Endorses good fetal movement.   [facility] at [facility] since 12 weeks, transferred to [facility] at 34wks due to IUGR.   Prenatal History/Complications: Fetal demise of twin B early in gestation Clinic  LR  Dating  LMP, consistent with 15 wk [place]  Genetic Screen  Quad:  Inc risk for Down's > called lab to confirm calculated with correct GA, pt notified via language line of results and that someone will call to set up appt with MFM to discuss testing options > [place] and detailed ultrasound   Anatomic [place]  18 wk > abnml 4 chamber > echo ordered  GTT Early:               Third trimester: 107  TDaP vaccine 03/31/14  Flu vaccine 03/31/14  GBS Negative  Contraception  Nexplanon  Baby Food  bottle  Circumcision  girl  Pediatrician [place]  Support Person Boyfriend   Past Medical History: Past Medical History  Diagnosis Date  . Pyelonephritis     Past Surgical History: Past Surgical History  Procedure Laterality Date  . No past surgeries      Obstetrical History: OB History    Gravida Para Term Preterm AB TAB SAB Ectopic Multiple Living   1               Social History: History   Social History  . Marital Status: Single    Spouse Name: N/A    Number of Children: N/A  . Years of Education: N/A   Social History Main Topics  . Smoking status: Never Smoker   . Smokeless tobacco: Never Used  . Alcohol Use: No  . Drug Use: No  . Sexual Activity:    Partners: Male    Birth Control/ Protection: None     Comment: Stopped Depo in November; trying for pregnancy   Other Topics Concern  . None   Social History Narrative    Family History: Family History  Problem Relation Age of Onset  . Alcohol abuse Neg Hx     Allergies: Allergies  Allergen Reactions  . Ondansetron Hives and Itching  . Promethazine Hives and Itching     Prescriptions prior to admission  Medication Sig Dispense Refill Last Dose  . acetaminophen (TYLENOL) 500 MG tablet Take 500 mg by mouth every 6 (six) hours as needed.   Taking  . famotidine (PEPCID) 40 MG tablet Take 1 tablet (40 mg total) by mouth every evening. 30 tablet 1 Taking  . ferrous sulfate 325 (65 FE) MG tablet Take 1 tablet (325 mg total) by mouth daily. 30 tablet 7 Taking  . Prenatal Vit-Fe Fumarate-FA (PRENATAL MULTIVITAMIN) TABS tablet Take 1 tablet by mouth at bedtime.    Taking     Review of Systems   Constitutional: No fevers or chills  Temperature 98.6 F (37 C), temperature source Oral, resp. rate 18, height 4\' 10"  (1.473 m), weight 47.628 kg (105 lb), last menstrual period 09/14/2013. General appearance: alert, cooperative and no distress Lungs: clear to auscultation bilaterally Heart: regular rate and rhythm Abdomen: soft, non-tender; bowel sounds normal Extremities: Homans sign is negative, no sign of DVT DTR's wnl Presentation: unsure Fetal monitoringBaseline: 140 bpm, Variability: Good {> 6 bpm), Accelerations: Reactive and Decelerations: Absent Uterine activityFrequency: Every 2-7 minutes Dilation: Closed Effacement (%): Thick Station: -3 Exam by:: Dr  Parker   Prenatal labs: ABO, Rh: AB/POS/-- (05/22 0932) Antibody: NEG (05/22 0932) Rubella:   RPR: NON REAC (09/10 1411)  HBsAg: NEGATIVE (05/22 0932)  HIV: NONREACTIVE (09/10 1411)  GBS:    1 hr Glucola: 107 Genetic screening:  Abnormal QUAD screen (Elevated hCG, DIA) DSR 1:179, Low risk [place] Anatomy US:  possible small perimembranous VSD, needs postnatal ECHO   Prenatal Transfer Tool  Maternal Diabetes: No Genetic Screening: Abnormal:  Results: Elevated risk of Trisomy 21 Maternal Ultrasounds/Referrals: Abnormal:  Findings:   Fetal Heart Anomalies; ?VSD Fetal Ultrasounds or other Referrals:  None Maternal Substance Abuse:  No Significant Maternal Medications:  Meds include: Other:  Pepcid Significant Maternal Lab Results: None   No results found for this or any previous visit (from the past 24 hour(s)).  Assessment: Shelly Koch is a 23 y.o. G1P0 at [redacted]w[redacted]d here for IOL 2/2 IUGR.  #Labor:IOL 2/2 IUGR, will start with cervical ripening with cytotec, anticipate foley bulb placement and initiation of pitocin protocol once cervix is favorable #Pain: Analgesia and anesthesia prn #FWB: Category 1 #ID:  GBS negative #MOF: bottle #MOC:undecided #Circ:  N/a, female baby  Dimas Chyle 06/04/2014, 9:43 PM  I examined pt and agree with documentation above and resident plan of care. Venia Carbon Michiel Cowboy, CNM

## 2014-06-05 ENCOUNTER — Encounter (HOSPITAL_COMMUNITY): Payer: Self-pay

## 2014-06-05 DIAGNOSIS — Z3A37 37 weeks gestation of pregnancy: Secondary | ICD-10-CM

## 2014-06-05 LAB — HIV ANTIBODY (ROUTINE TESTING W REFLEX): HIV 1&2 Ab, 4th Generation: NONREACTIVE

## 2014-06-05 LAB — SYPHILIS: RPR W/REFLEX TO RPR TITER AND TREPONEMAL ANTIBODIES, TRADITIONAL SCREENING AND DIAGNOSIS ALGORITHM

## 2014-06-05 MED ORDER — OXYCODONE-ACETAMINOPHEN 5-325 MG PO TABS
1.0000 | ORAL_TABLET | ORAL | Status: DC | PRN
  Administered 2014-06-06: 1 via ORAL
  Filled 2014-06-05: qty 1

## 2014-06-05 MED ORDER — IBUPROFEN 600 MG PO TABS
600.0000 mg | ORAL_TABLET | Freq: Four times a day (QID) | ORAL | Status: DC
  Administered 2014-06-05 – 2014-06-07 (×7): 600 mg via ORAL
  Filled 2014-06-05 (×8): qty 1

## 2014-06-05 MED ORDER — PRENATAL MULTIVITAMIN CH
1.0000 | ORAL_TABLET | Freq: Every day | ORAL | Status: DC
  Administered 2014-06-06 – 2014-06-07 (×2): 1 via ORAL
  Filled 2014-06-05 (×2): qty 1

## 2014-06-05 MED ORDER — LACTATED RINGERS IV SOLN: 500.0000 mL | Freq: Once | INTRAVENOUS | Status: DC

## 2014-06-05 MED ORDER — ZOLPIDEM TARTRATE 5 MG PO TABS
5.0000 mg | ORAL_TABLET | Freq: Every evening | ORAL | Status: DC | PRN
Start: 2014-06-05 — End: 2014-06-07

## 2014-06-05 MED ORDER — OXYCODONE-ACETAMINOPHEN 5-325 MG PO TABS: 2.0000 | ORAL_TABLET | ORAL | Status: DC | PRN

## 2014-06-05 MED ORDER — FERROUS SULFATE 325 (65 FE) MG PO TABS
325.0000 mg | ORAL_TABLET | Freq: Two times a day (BID) | ORAL | Status: DC
  Administered 2014-06-06 – 2014-06-07 (×4): 325 mg via ORAL
  Filled 2014-06-05 (×4): qty 1

## 2014-06-05 MED ORDER — TETANUS-DIPHTH-ACELL PERTUSSIS 5-2.5-18.5 LF-MCG/0.5 IM SUSP: 0.5000 mL | Freq: Once | INTRAMUSCULAR | Status: DC

## 2014-06-05 MED ORDER — MEASLES, MUMPS & RUBELLA VAC ~~LOC~~ INJ
0.5000 mL | INJECTION | Freq: Once | SUBCUTANEOUS | Status: DC
  Filled 2014-06-05: qty 0.5

## 2014-06-05 MED ORDER — DIPHENHYDRAMINE HCL 50 MG/ML IJ SOLN
12.5000 mg | INTRAMUSCULAR | Status: DC | PRN
Start: 2014-06-05 — End: 2014-06-05

## 2014-06-05 MED ORDER — DIPHENHYDRAMINE HCL 25 MG PO CAPS: 25.0000 mg | ORAL_CAPSULE | Freq: Four times a day (QID) | ORAL | Status: DC | PRN

## 2014-06-05 MED ORDER — FENTANYL 2.5 MCG/ML BUPIVACAINE 1/10 % EPIDURAL INFUSION (WH - ANES): 14.0000 mL/h | INTRAMUSCULAR | Status: DC | PRN

## 2014-06-05 MED ORDER — FENTANYL CITRATE 0.05 MG/ML IJ SOLN
100.0000 ug | INTRAMUSCULAR | Status: DC | PRN
  Administered 2014-06-05: 100 ug via INTRAVENOUS
  Filled 2014-06-05: qty 2

## 2014-06-05 MED ORDER — BENZOCAINE-MENTHOL 20-0.5 % EX AERO
1.0000 "application " | INHALATION_SPRAY | CUTANEOUS | Status: DC | PRN
  Filled 2014-06-05: qty 56

## 2014-06-05 MED ORDER — EPHEDRINE 5 MG/ML INJ
10.0000 mg | INTRAVENOUS | Status: DC | PRN
  Filled 2014-06-05: qty 2

## 2014-06-05 MED ORDER — LANOLIN HYDROUS EX OINT
1.0000 | TOPICAL_OINTMENT | CUTANEOUS | Status: DC | PRN
Start: 2014-06-05 — End: 2014-06-07

## 2014-06-05 MED ORDER — DIBUCAINE 1 % RE OINT: 1.0000 "application " | TOPICAL_OINTMENT | RECTAL | Status: DC | PRN

## 2014-06-05 MED ORDER — PHENYLEPHRINE 40 MCG/ML (10ML) SYRINGE FOR IV PUSH (FOR BLOOD PRESSURE SUPPORT)
80.0000 ug | PREFILLED_SYRINGE | INTRAVENOUS | Status: DC | PRN
  Filled 2014-06-05: qty 2

## 2014-06-05 MED ORDER — FENTANYL CITRATE 0.05 MG/ML IJ SOLN
100.0000 ug | Freq: Once | INTRAMUSCULAR | Status: AC
  Administered 2014-06-05: 100 ug via INTRAVENOUS
  Filled 2014-06-05: qty 2

## 2014-06-05 MED ORDER — TERBUTALINE SULFATE 1 MG/ML IJ SOLN: 0.2500 mg | Freq: Once | INTRAMUSCULAR | Status: DC | PRN

## 2014-06-05 MED ORDER — MAGNESIUM HYDROXIDE 400 MG/5ML PO SUSP: 30.0000 mL | ORAL | Status: DC | PRN

## 2014-06-05 MED ORDER — WITCH HAZEL-GLYCERIN EX PADS: 1.0000 "application " | MEDICATED_PAD | CUTANEOUS | Status: DC | PRN

## 2014-06-05 MED ORDER — SENNOSIDES-DOCUSATE SODIUM 8.6-50 MG PO TABS
2.0000 | ORAL_TABLET | ORAL | Status: DC
  Administered 2014-06-06 – 2014-06-07 (×2): 2 via ORAL
  Filled 2014-06-05 (×3): qty 2

## 2014-06-05 MED ORDER — SIMETHICONE 80 MG PO CHEW
80.0000 mg | CHEWABLE_TABLET | ORAL | Status: DC | PRN
  Administered 2014-06-06: 80 mg via ORAL
  Filled 2014-06-05: qty 1

## 2014-06-05 MED ORDER — OXYTOCIN 40 UNITS IN LACTATED RINGERS INFUSION - SIMPLE MED
1.0000 m[IU]/min | INTRAVENOUS | Status: DC
  Administered 2014-06-05: 2 m[IU]/min via INTRAVENOUS
  Filled 2014-06-05: qty 1000

## 2014-06-05 NOTE — Progress Notes (Signed)
LABOR PROGRESS NOTE  Shelly Koch is a 23 y.o. G1P0 at [redacted]w[redacted]d admitted for induction of labor due to IUGR.  Subjective: Last dose of cytotec at 0900, currently very uncomfortable  Objective: BP 136/89 mmHg  Pulse 78  Temp(Src) 98.3 F (36.8 C) (Oral)  Resp 18  Ht 4\' 10"  (1.473 m)  Wt 105 lb (47.628 kg)  BMI 21.95 kg/m2  LMP 09/14/2013 or  Filed Vitals:   06/05/14 0700 06/05/14 0738 06/05/14 1510 06/05/14 1640  BP:  141/84 138/90 136/89  Pulse:  72 82 78  Temp:  98.6 F (37 C) 98.3 F (36.8 C)   TempSrc:  Oral Oral   Resp: 18 18 18    Height:      Weight:           FHT:  FHR: 135 bpm, variability: moderate,  accelerations:  Present,  decelerations:  Absent UC:   Irregular, q2-61min SVE:   Dilation: 5 Effacement (%): 90 Station: -1 Exam by:: dr Shelly Koch  Dilation: 5 Effacement (%): 90 Cervical Position: Middle Station: -1 Presentation: Vertex Exam by:: dr Shelly Koch Foley bulb out   Labs: Lab Results  Component Value Date   WBC 12.8* 06/04/2014   HGB 12.1 06/04/2014   HCT 35.8* 06/04/2014   MCV 98.6 06/04/2014   PLT 132* 06/04/2014    Assessment / Plan: Induction of labor due to IUGR s/p cytotec and FB  Labor: Progressing normally,  - 1345: FB placed, SROM during exam clear - 1700: FB out, will monitor and start pitocin if needed.  Patient with strong contractions and visibly uncomfortable. Fetal Wellbeing:  Category I Pain Control:  Fentanyl Anticipated MOD:  NSVD  Shelly Koch [name], MD 06/05/2014, 4:55 PM

## 2014-06-05 NOTE — Progress Notes (Signed)
Shelly Koch is a 23 y.o. G1P0 at [redacted]w[redacted]d  admitted for induction of labor due to IUGR.  Subjective: No complaints.  Objective: Filed Vitals:   06/04/14 2048 06/04/14 2049 06/04/14 2315  BP:  137/86 128/81  Pulse:  88 92  Temp: 98.6 F (37 C)  98.3 F (36.8 C)  TempSrc: Oral  Oral  Resp: 18  18  Height: 4\' 10"  (1.473 m)    Weight: 47.628 kg (105 lb)        FHT:  FHR: 125 bpm, variability: moderate,  accelerations:  Present,  decelerations:  Absent UC:   irregular, every 2-7 minutes SVE:   Dilation: Closed Effacement (%): Thick Station: -3 Exam by:: Dr Jerline Pain   Labs: Lab Results  Component Value Date   WBC 12.8* 06/04/2014   HGB 12.1 06/04/2014   HCT 35.8* 06/04/2014   MCV 98.6 06/04/2014   PLT 132* 06/04/2014    Assessment / Plan: Induction of labor due to IUGR  Labor: Progressing, second dose of cytotec placed Fetal Wellbeing:  Category I Pain Control:  Labor support without medications Anticipated MOD:  NSVD  Dimas Chyle 06/05/2014, 3:51 AM

## 2014-06-05 NOTE — Progress Notes (Signed)
LABOR PROGRESS NOTE  Shelly Koch is a 23 y.o. G1P0 at [redacted]w[redacted]d admitted for induction of labor due to IUGR.  Subjective: Last dose of cytotec at 0900, currently very uncomfortable  Objective: BP 141/84 mmHg  Pulse 72  Temp(Src) 98.6 F (37 C) (Oral)  Resp 18  Ht 4\' 10"  (1.473 m)  Wt 105 lb (47.628 kg)  BMI 21.95 kg/m2  LMP 09/14/2013 or  Filed Vitals:   06/05/14 0500 06/05/14 0600 06/05/14 0700 06/05/14 0738  BP:    141/84  Pulse:    72  Temp:    98.6 F (37 C)  TempSrc:    Oral  Resp: 16 16 18 18   Height:      Weight:           FHT:  FHR: 135 bpm, variability: moderate,  accelerations:  Present,  decelerations:  Absent UC:   regular, every 2 minutes SVE:   Dilation: 1.5 Effacement (%): 80 Station: -2 Exam by:: Dr Deniece Ree  Dilation: 1.5 Effacement (%): 80 Cervical Position: Middle Station: -2 Presentation: Vertex Exam by:: Dr Deniece Ree Significant caput noted on exam   Labs: Lab Results  Component Value Date   WBC 12.8* 06/04/2014   HGB 12.1 06/04/2014   HCT 35.8* 06/04/2014   MCV 98.6 06/04/2014   PLT 132* 06/04/2014    Assessment / Plan: Induction of labor due to IUGR,  progressing well on pitocin  Labor: Progressing normally, FB placed without complication at 0539, SROM during exam clear Fetal Wellbeing:  Category I Pain Control:  Fentanyl Anticipated MOD:  NSVD  Briellah Baik [name], MD 06/05/2014, 1:49 PM

## 2014-06-06 NOTE — Progress Notes (Signed)
UR chart review completed.  

## 2014-06-06 NOTE — Plan of Care (Signed)
Problem: Phase II Progression Outcomes Goal: Pain controlled on oral analgesia Outcome: Completed/Met Date Met:  06/06/14 Goal: Progress activity as tolerated unless otherwise ordered Outcome: Completed/Met Date Met:  06/06/14 Goal: Afebrile, VS remain stable Outcome: Completed/Met Date Met:  06/06/14 Goal: Incision intact & without signs/symptoms of infection Outcome: Completed/Met Date Met:  06/06/14

## 2014-06-06 NOTE — Plan of Care (Signed)
Problem: Phase II Progression Outcomes Goal: Rh isoimmunization per orders Outcome: Not Applicable Date Met:  24/40/10

## 2014-06-06 NOTE — Plan of Care (Signed)
Problem: Phase II Progression Outcomes Goal: Tolerating diet Outcome: Completed/Met Date Met:  06/06/14 Goal: Other Phase II Outcomes/Goals Outcome: Not Applicable Date Met:  27/51/70

## 2014-06-06 NOTE — Plan of Care (Signed)
Problem: Phase I Progression Outcomes Goal: Pain controlled with appropriate interventions Outcome: Completed/Met Date Met:  06/06/14 Goal: Voiding adequately Outcome: Completed/Met Date Met:  06/06/14 Goal: Foley catheter patent Outcome: Not Applicable Date Met:  06/06/14 Goal: OOB as tolerated unless otherwise ordered Outcome: Not Applicable Date Met:  06/06/14 Goal: IS, TCDB as ordered Outcome: Not Applicable Date Met:  06/06/14 Goal: VS, stable, temp < 100.4 degrees F Outcome: Completed/Met Date Met:  06/06/14 Goal: Initial discharge plan identified Outcome: Completed/Met Date Met:  06/06/14 Goal: Other Phase I Outcomes/Goals Outcome: Completed/Met Date Met:  06/06/14     

## 2014-06-06 NOTE — Progress Notes (Signed)
Post Partum Day 1 Subjective: no complaints, up ad lib, voiding, tolerating PO and + flatus  Objective: Blood pressure 120/82, pulse 78, temperature 97.9 F (36.6 C), temperature source Oral, resp. rate 18, height 4\' 10"  (1.473 m), weight 47.628 kg (105 lb), last menstrual period 09/14/2013, SpO2 99 %, unknown if currently breastfeeding.  Physical Exam:  General: alert, cooperative, appears stated age and no distress Lochia: appropriate Uterine Fundus: firm Incision: healing well DVT Evaluation: Negative Homan's sign.   Recent Labs  06/04/14 2145  HGB 12.1  HCT 35.8*    Assessment/Plan: Plan for discharge tomorrow and Contraception undecided. Bottlefeeding   LOS: 2 days   Manya Silvas 06/06/2014, 9:51 AM

## 2014-06-07 MED ORDER — IBUPROFEN 600 MG PO TABS: 600.0000 mg | ORAL_TABLET | Freq: Four times a day (QID) | ORAL | Status: DC

## 2014-06-07 NOTE — Discharge Instructions (Signed)

## 2014-06-07 NOTE — Plan of Care (Signed)
Problem: Consults Goal: Postpartum Patient Education (See Patient Education module for education specifics.)  Outcome: Completed/Met Date Met:  06/07/14 Goal: Skin Care Protocol Initiated - if Braden Score 18 or less If consults are not indicated, leave blank or document N/A  Outcome: Not Applicable Date Met:  70/76/15 Goal: Nutrition Consult-if indicated Outcome: Not Applicable Date Met:  18/34/37 Goal: Diabetes Guidelines if Diabetic/Glucose > 140 If diabetic or lab glucose is > 140 mg/dl - Initiate Diabetes/Hyperglycemia Guidelines & Document Interventions  Outcome: Not Applicable Date Met:  35/78/97  Problem: Discharge Progression Outcomes Goal: Tolerating diet Outcome: Completed/Met Date Met:  06/07/14

## 2014-06-07 NOTE — Plan of Care (Signed)
Problem: Discharge Progression Outcomes Goal: Barriers To Progression Addressed/Resolved Outcome: Completed/Met Date Met:  06/07/14 Goal: Activity appropriate for discharge plan Outcome: Completed/Met Date Met:  93/23/55 Goal: Complications resolved/controlled Outcome: Completed/Met Date Met:  06/07/14 Goal: Pain controlled with appropriate interventions Outcome: Completed/Met Date Met:  06/07/14 Goal: Afebrile, VS remain stable at discharge Outcome: Completed/Met Date Met:  06/07/14 Goal: Remove staples per MD order Outcome: Not Applicable Date Met:  73/22/02 Goal: MMR given as ordered Outcome: Not Applicable Date Met:  54/27/06 Goal: Discharge plan in place and appropriate Outcome: Completed/Met Date Met:  06/07/14 Goal: Other Discharge Outcomes/Goals Outcome: Completed/Met Date Met:  06/07/14

## 2014-06-07 NOTE — Discharge Summary (Signed)
Obstetric Discharge Summary Reason for Admission: induction of labor for IUGR Prenatal Procedures: none Intrapartum Procedures: spontaneous vaginal delivery Postpartum Procedures: none Complications-Operative and Postpartum: 2nd degree perineal laceration with right labial extension HEMOGLOBIN  Date Value Ref Range Status  06/04/2014 12.1 12.0 - 15.0 g/dL Final   HCT  Date Value Ref Range Status  06/04/2014 35.8* 36.0 - 46.0 % Final    Subjective: No problems or concerns. Patient states doing well. Reports minimal bleeding and pain controlled with pain medications. Denies calf pain.  Breastfeeding.  Uncertain regarding family planning.  Physical Exam:  Filed Vitals:   06/07/14 0500  BP: 93/51  Pulse: 84  Temp: 98.2 F (36.8 C)  Resp: 18    General: alert and cooperative Lochia: appropriate Uterine Fundus: firm Incision: None DVT Evaluation: No evidence of DVT seen on physical exam. Negative Homan's sign.  Discharge Diagnoses: Term Pregnancy-delivered  Discharge Information: Date: 06/07/2014 Activity: unrestricted Diet: routine Medications: PNV and Ibuprofen Condition: stable Instructions: refer to practice specific booklet Discharge to: home   Newborn Data: Live born female  Birth Weight: 4 lb 10.4 oz (2110 g) APGAR: 8, 9  Home with mother pending Pediatrician examination today.  Cordelia Poche 06/07/2014, 7:54 AM   I examined pt and agree with documentation above and resident plan of care. Venia Carbon Michiel Cowboy, CNM

## 2014-07-04 ENCOUNTER — Inpatient Hospital Stay (HOSPITAL_COMMUNITY)
Admission: AD | Admit: 2014-07-04 | Discharge: 2014-07-04 | Disposition: A | Discharge status: Home / Self Care | Payer: Medicaid Other | Source: Ambulatory Visit | Attending: Family Medicine | Admitting: Family Medicine

## 2014-07-04 ENCOUNTER — Inpatient Hospital Stay (HOSPITAL_COMMUNITY): Payer: Medicaid Other

## 2014-07-04 ENCOUNTER — Encounter (HOSPITAL_COMMUNITY): Payer: Self-pay | Admitting: *Deleted

## 2014-07-04 DIAGNOSIS — R1030 Lower abdominal pain, unspecified: Secondary | ICD-10-CM | POA: Diagnosis present

## 2014-07-04 DIAGNOSIS — N719 Inflammatory disease of uterus, unspecified: Secondary | ICD-10-CM

## 2014-07-04 DIAGNOSIS — O8612 Endometritis following delivery: Secondary | ICD-10-CM | POA: Insufficient documentation

## 2014-07-04 DIAGNOSIS — Z3401 Encounter for supervision of normal first pregnancy, first trimester: Secondary | ICD-10-CM

## 2014-07-04 DIAGNOSIS — N939 Abnormal uterine and vaginal bleeding, unspecified: Secondary | ICD-10-CM

## 2014-07-04 LAB — CBC
HCT: 36.5 % (ref 36.0–46.0)
Hemoglobin: 12.1 g/dL (ref 12.0–15.0)
MCH: 32.4 pg (ref 26.0–34.0)
MCHC: 33.2 g/dL (ref 30.0–36.0)
MCV: 97.6 fL (ref 78.0–100.0)
PLATELETS: 234 10*3/uL (ref 150–400)
RBC: 3.74 MIL/uL — ABNORMAL LOW (ref 3.87–5.11)
RDW: 11.6 % (ref 11.5–15.5)
WBC: 8.4 10*3/uL (ref 4.0–10.5)

## 2014-07-04 LAB — URINALYSIS, ROUTINE W REFLEX MICROSCOPIC
Bilirubin Urine: NEGATIVE
Glucose, UA: NEGATIVE mg/dL
Ketones, ur: NEGATIVE mg/dL
Nitrite: NEGATIVE
PH: 6 (ref 5.0–8.0)
Protein, ur: NEGATIVE mg/dL
SPECIFIC GRAVITY, URINE: 1.01 (ref 1.005–1.030)
Urobilinogen, UA: 0.2 mg/dL (ref 0.0–1.0)

## 2014-07-04 LAB — URINE MICROSCOPIC-ADD ON

## 2014-07-04 LAB — POCT PREGNANCY, URINE: Preg Test, Ur: POSITIVE — AB

## 2014-07-04 LAB — HCG, QUANTITATIVE, PREGNANCY: hCG, Beta Chain, Quant, S: 389 m[IU]/mL — ABNORMAL HIGH (ref <5)

## 2014-07-04 MED ORDER — IBUPROFEN 600 MG PO TABS: 600.0000 mg | ORAL_TABLET | Freq: Four times a day (QID) | ORAL | Status: DC | PRN

## 2014-07-04 MED ORDER — AMOXICILLIN-POT CLAVULANATE 875-125 MG PO TABS: 1.0000 | ORAL_TABLET | Freq: Two times a day (BID) | ORAL | Status: DC

## 2014-07-04 MED ORDER — METHYLERGONOVINE MALEATE 0.2 MG PO TABS: 0.2000 mg | ORAL_TABLET | Freq: Four times a day (QID) | ORAL | Status: DC

## 2014-07-04 NOTE — MAU Provider Note (Signed)
History     CSN: [id number]  Arrival date and time: 07/04/14 1257   None     Chief Complaint  Patient presents with  . Abdominal Pain  . Vaginal Bleeding   HPI Comments: Patient delivered vaginally  23 yo G1P1001 who delivered vaginally 1 month ago.  She reports nonradiating lower abdominal pain bilaterally with continued vaginal bleeding.  No provoking or palliating factors.  She has a positive pregnancy test, but adamantly denies having intercourse.  Additionally, she complains of perineal pain that has continued since delivering her baby.  She denies fevers, chills, nausea, vomiting.  OB History    Gravida Para Term Preterm AB TAB SAB Ectopic Multiple Living   1 1 1       0 1      Past Medical History  Diagnosis Date  . Pyelonephritis     Past Surgical History  Procedure Laterality Date  . No past surgeries      Family History  Problem Relation Age of Onset  . Alcohol abuse Neg Hx     History  Substance Use Topics  . Smoking status: Never Smoker   . Smokeless tobacco: Never Used  . Alcohol Use: No    Allergies:  Allergies  Allergen Reactions  . Ginger Hives and Itching  . Ondansetron Hives and Itching  . Promethazine Hives and Itching    Prescriptions prior to admission  Medication Sig Dispense Refill Last Dose  . acetaminophen (TYLENOL) 500 MG tablet Take 500 mg by mouth every 6 (six) hours as needed for mild pain.    07/03/2014 at Unknown time  . docusate sodium (COLACE) 100 MG capsule Take 100 mg by mouth 2 (two) times daily as needed for mild constipation.   07/04/2014 at Unknown time  . famotidine (PEPCID) 40 MG tablet Take 1 tablet (40 mg total) by mouth every evening. 30 tablet 1 Past Month at Unknown time  . ibuprofen (ADVIL,MOTRIN) 600 MG tablet Take 1 tablet (600 mg total) by mouth every 6 (six) hours. 30 tablet 0 Past Week at Unknown time  . Prenatal Vit-Fe Fumarate-FA (PRENATAL MULTIVITAMIN) TABS tablet Take 1 tablet by mouth at bedtime.     07/03/2014 at Unknown time  . ferrous sulfate 325 (65 FE) MG tablet Take 1 tablet (325 mg total) by mouth daily. (Patient not taking: Reported on 07/04/2014) 30 tablet 7 Not Taking at Unknown time    Review of Systems  Constitutional: Negative for fever, chills and weight loss.  Eyes: Negative for blurred vision, double vision and photophobia.  Cardiovascular: Negative for chest pain and palpitations.  Gastrointestinal: Negative for nausea, vomiting, diarrhea, constipation and blood in stool.  Genitourinary: Negative for dysuria, urgency and hematuria.  Neurological: Negative for dizziness, focal weakness and weakness.  Endo/Heme/Allergies: Does not bruise/bleed easily.   Physical Exam   Blood pressure 108/49, pulse 97, temperature 97.9 F (36.6 C), temperature source Oral, resp. rate 18, height 5\' 1"  (1.549 m), weight 181 lb (82.101 kg), SpO2 99 %, unknown if currently breastfeeding.  Physical Exam  Constitutional: She is oriented to person, place, and time. She appears well-developed and well-nourished.  HENT:  Head: Normocephalic and atraumatic.  Cardiovascular: Normal rate and regular rhythm.  Exam reveals no gallop and no friction rub.   No murmur heard. Respiratory: Effort normal and breath sounds normal. No respiratory distress. She has no wheezes. She has no rales. She exhibits no tenderness.  GI: Soft. Bowel sounds are normal. She exhibits no distension and no  mass. There is tenderness (pelvic discomfort). There is no rebound and no guarding.  Genitourinary:    There is no rash, tenderness, lesion or injury on the right labia. There is no rash, tenderness, lesion or injury on the left labia. Uterus is tender. No erythema, tenderness or bleeding in the vagina. No foreign body around the vagina. No signs of injury around the vagina. No vaginal discharge found.  Neurological: She is alert and oriented to person, place, and time.  Skin: Skin is warm and dry. No rash noted. No  erythema. No pallor.  Psychiatric: She has a normal mood and affect. Her behavior is normal. Judgment and thought content normal.    Results for orders placed or performed during the hospital encounter of 07/04/14 (from the past 24 hour(s))  Urinalysis, Routine w reflex microscopic     Status: Abnormal   Collection Time: 07/04/14  1:11 PM  Result Value Ref Range   Color, Urine YELLOW YELLOW   APPearance CLEAR CLEAR   Specific Gravity, Urine 1.010 1.005 - 1.030   pH 6.0 5.0 - 8.0   Glucose, UA NEGATIVE NEGATIVE mg/dL   Hgb urine dipstick LARGE (A) NEGATIVE   Bilirubin Urine NEGATIVE NEGATIVE   Ketones, ur NEGATIVE NEGATIVE mg/dL   Protein, ur NEGATIVE NEGATIVE mg/dL   Urobilinogen, UA 0.2 0.0 - 1.0 mg/dL   Nitrite NEGATIVE NEGATIVE   Leukocytes, UA SMALL (A) NEGATIVE  Urine microscopic-add on     Status: Abnormal   Collection Time: 07/04/14  1:11 PM  Result Value Ref Range   Squamous Epithelial / LPF FEW (A) RARE   WBC, UA 0-2 <3 WBC/hpf   RBC / HPF 7-10 <3 RBC/hpf  Pregnancy, urine POC     Status: Abnormal   Collection Time: 07/04/14  1:21 PM  Result Value Ref Range   Preg Test, Ur POSITIVE (A) NEGATIVE  hCG, quantitative, pregnancy     Status: Abnormal   Collection Time: 07/04/14  3:01 PM  Result Value Ref Range   hCG, Beta Chain, Quant, S 389 (H) <5 mIU/mL  CBC     Status: Abnormal   Collection Time: 07/04/14  3:01 PM  Result Value Ref Range   WBC 8.4 4.0 - 10.5 K/uL   RBC 3.74 (L) 3.87 - 5.11 MIL/uL   Hemoglobin 12.1 12.0 - 15.0 g/dL   HCT 36.5 36.0 - 46.0 %   MCV 97.6 78.0 - 100.0 fL   MCH 32.4 26.0 - 34.0 pg   MCHC 33.2 30.0 - 36.0 g/dL   RDW 11.6 11.5 - 15.5 %   Platelets 234 150 - 400 K/uL   US Pelvis Complete  07/04/2014   CLINICAL DATA:  Vaginal delivery 1 month ago. Continued pain, cramping and bleeding since delivery.  EXAM: TRANSABDOMINAL ULTRASOUND OF PELVIS  TECHNIQUE: Transabdominal ultrasound examination of the pelvis was performed including evaluation  of the uterus, ovaries, adnexal regions, and pelvic cul-de-sac.  COMPARISON:  None.  FINDINGS: Uterus  Measurements: 7.3 x 4.6 x 5.8 cm. No fibroids or other mass visualized.  Endometrium  Thickness: Thickened at 24 mm, heterogeneous. Internal blood flow noted.  Right ovary  Measurements: 2.3 x 1.6 x 2.1 cm. Normal appearance/no adnexal mass.  Left ovary  Measurements: Not visualized. No adnexal masses seen.  Other findings:  No free fluid  IMPRESSION: Thickened, heterogeneous endometrium with internal blood flow. Findings suspicious for retained products of conception.   Electronically Signed   By: Rolm Baptise M.D.   On: 07/04/2014 16:08  MAU Course  Procedures  MDM Retained POC  Assessment and Plan   #1 Retained POC #2 Endometritis  Methergine 4x daily for 1 week, Augmentin 875mg  BID x2 weeks.  Ibuprofen as needed.  F/u in clinic in 1 week.  STINSON, JACOB JEHIEL 07/04/2014, 3:16 PM

## 2014-07-04 NOTE — Discharge Instructions (Signed)
Endometritis Endometritis is an irritation, soreness, and swelling (inflammation) of the lining of the uterus (endometrium).  CAUSES   Bacterial infections.  Sexually transmitted infections (STIs).  Having a miscarriage or childbirth, especially after a long labor or cesarean delivery.  Certain gynecological procedures (such as dilation and curettage, hysteroscopy, or contraceptive insertion). SIGNS AND SYMPTOMS   Fever.  Lower abdominal or pelvic pain.  Abnormal vaginal discharge or bleeding.  Abdominal bloating (distention) or swelling.  General discomfort or ill feeling.  Discomfort with bowel movements. DIAGNOSIS  A physical and pelvic exam are performed. Other tests may include:  Cultures from the cervix.  Blood tests.  Examining a tissue sample of the uterine lining (endometrial biopsy).  Examining discharge under a microscope (wet prep).  Laparoscopy. TREATMENT  Antibiotic medicines are usually given. Other treatments may include:  Fluids through an IV tube inserted in your vein.  Rest. HOME CARE INSTRUCTIONS   Take over-the-counter or prescription medicines for pain, discomfort, or fever as directed by your health care provider.  Take your antibiotics as directed. Finish them even if you start to feel better.  Resume your normal diet and activities as directed or as tolerated.  Do not douche or have sexual intercourse until your health care provider says it is okay.  Do not have sexual intercourse until your partner has been treated if your endometritis is caused by an STI. SEEK IMMEDIATE MEDICAL CARE IF:   You have swelling or increasing pain in the abdomen.  You have a fever.  You have bad smelling vaginal discharge, or you have an increased amount of discharge.  You have abnormal vaginal bleeding.  Your medicine is not helping with the pain.  You experience any problems that may be related to the medicine you are taking.  You have nausea  and vomiting, or you cannot keep foods down.  You have pain with bowel movements. MAKE SURE YOU:   Understand these instructions.  Will watch your condition.  Will get help right away if you are not doing well or get worse. Document Released: 07/02/2001 Document Revised: 03/10/2013 Document Reviewed: 02/04/2013 Exit[employer] Patient Information 2015 [employer]. This information is not intended to replace advice given to you by your health care provider. Make sure you discuss any questions you have with your health care provider.  

## 2014-07-04 NOTE — MAU Note (Signed)
Patient presents with complaint of abdominal pain. States she also has vaginal bleeding. Delivered vaginally 1 month ago.

## 2014-07-11 ENCOUNTER — Ambulatory Visit (INDEPENDENT_AMBULATORY_CARE_PROVIDER_SITE_OTHER): Payer: Medicaid Other | Admitting: Family Medicine

## 2014-07-11 ENCOUNTER — Encounter: Payer: Self-pay | Admitting: Family Medicine

## 2014-07-11 VITALS — BP 122/79 | HR 73 | Temp 97.6°F | Ht <= 58 in | Wt 93.8 lb

## 2014-07-11 DIAGNOSIS — N719 Inflammatory disease of uterus, unspecified: Secondary | ICD-10-CM

## 2014-07-11 NOTE — Progress Notes (Signed)
   Subjective:    Patient ID: Shelly Koch, female    DOB: August 29, 1990, 23 y.o.   MRN: [id number]  HPI Was seen in MAU for retained POC and endometritis.  Has been on methergine and Augmentin.  Has passed some tissue and is now spotting.  Has no other complaints.   Review of Systems  Constitutional: Negative for fever, chills and fatigue.  Gastrointestinal: Negative for nausea, vomiting, abdominal pain, diarrhea and constipation.       Objective:   Physical Exam  Constitutional: She appears well-developed and well-nourished.  Pulmonary/Chest: Effort normal and breath sounds normal.  Abdominal: Soft. Bowel sounds are normal. She exhibits no distension and no mass. There is no tenderness. There is no rebound and no guarding.  Skin: Skin is warm and dry.  Psychiatric: She has a normal mood and affect. Her behavior is normal. Judgment and thought content normal.      Assessment & Plan:   Problem List Items Addressed This Visit    None    Visit Diagnoses    Endometritis    -  Primary    Retained products of conception with hemorrhage          Improving - f/u for PP visit next week.

## 2014-07-20 ENCOUNTER — Telehealth: Payer: Self-pay | Admitting: General Practice

## 2014-07-20 ENCOUNTER — Ambulatory Visit (INDEPENDENT_AMBULATORY_CARE_PROVIDER_SITE_OTHER): Payer: Medicaid Other | Admitting: Obstetrics & Gynecology

## 2014-07-20 ENCOUNTER — Encounter: Payer: Self-pay | Admitting: Obstetrics & Gynecology

## 2014-07-20 VITALS — BP 116/78 | HR 65 | Temp 98.8°F | Ht <= 58 in | Wt 93.1 lb

## 2014-07-20 DIAGNOSIS — Z308 Encounter for other contraceptive management: Secondary | ICD-10-CM

## 2014-07-20 DIAGNOSIS — Z01812 Encounter for preprocedural laboratory examination: Secondary | ICD-10-CM

## 2014-07-20 DIAGNOSIS — Z3049 Encounter for surveillance of other contraceptives: Secondary | ICD-10-CM

## 2014-07-20 MED ORDER — ETONOGESTREL 68 MG ~~LOC~~ IMPL
68.0000 mg | DRUG_IMPLANT | Freq: Once | SUBCUTANEOUS | Status: AC
  Administered 2014-07-20: 68 mg via SUBCUTANEOUS

## 2014-07-20 NOTE — Telephone Encounter (Signed)
Patient left today prior to labs being drawn. Patient needs bhcg. Called patient, no answer- left message stating we are calling because she left today before her labwork and to come by the office tomorrow morning or call us back at the clinics

## 2014-07-20 NOTE — Progress Notes (Signed)
Patient ID: Shelly Koch, female   DOB: April 03, 1991, 23 y.o.   MRN: [id number] Subjective:     Shelly Koch is a 23 y.o. female who presents for a postpartum visit. She is 6 weeks postpartum following a spontaneous vaginal delivery. I have fully reviewed the prenatal and intrapartum course. The delivery was at 37 4/7 gestational weeks. Outcome: spontaneous vaginal delivery. Anesthesia: local. Postpartum course has been complicated with endometritis and retained POC. Baby's course has been uncomplicated. Baby is feeding by bottle. Bleeding staining only. Bowel function is normal. Bladder function is normal. Patient is not sexually active. Contraception method is abstinence. Postpartum depression screening: negative.  The following portions of the patient's history were reviewed and updated as appropriate: allergies, current medications, past family history, past medical history, past social history, past surgical history and problem list.  Review of Systems A comprehensive review of systems was negative.   Objective:    BP 116/78 mmHg  Pulse 65  Temp(Src) 98.8 F (37.1 C) (Oral)  Ht 4\' 10"  (1.473 m)  Wt 93 lb 1.6 oz (42.23 kg)  BMI 19.46 kg/m2  Breastfeeding? No       Abd: soft, NT, ND Patient given informed consent, she signed consent form. Pregnancy test was negative.  Appropriate time out taken.  Patient's left arm was prepped and draped in the usual sterile fashion.. The ruler used to measure and mark insertion area.  Patient was prepped with alcohol swab and then injected with 5 ml of 1 % lidocaine.  She was prepped with betadine, Nexplanon removed from packaging,  Device confirmed in needle, then inserted full length of needle and withdrawn per handbook instructions.  There was minimal blood loss.  Patient insertion site covered with guaze and a pressure bandage to reduce any bruising.  The patient tolerated the procedure well and was given post procedure instructions.   Assessment:      6 week postpartum exam. Pap smear not done at today's visit.   Nexplanon placed today  Plan:    1. Contraception: Nexplanon 2. Return in about one month for Nexplanon check. 3. Follow up in: 4 weeks or as needed.

## 2014-07-20 NOTE — Patient Instructions (Signed)
Etonogestrel implant What is this medicine? ETONOGESTREL (et oh noe JES trel) is a contraceptive (birth control) device. It is used to prevent pregnancy. It can be used for up to 3 years. This medicine may be used for other purposes; ask your health care provider or pharmacist if you have questions. COMMON BRAND NAME(S): Implanon, Nexplanon What should I tell my health care provider before I take this medicine? They need to know if you have any of these conditions: -abnormal vaginal bleeding -blood vessel disease or blood clots -cancer of the breast, cervix, or liver -depression -diabetes -gallbladder disease -headaches -heart disease or recent heart attack -high blood pressure -high cholesterol -kidney disease -liver disease -renal disease -seizures -tobacco smoker -an unusual or allergic reaction to etonogestrel, other hormones, anesthetics or antiseptics, medicines, foods, dyes, or preservatives -pregnant or trying to get pregnant -breast-feeding How should I use this medicine? This device is inserted just under the skin on the inner side of your upper arm by a health care professional. Talk to your pediatrician regarding the use of this medicine in children. Special care may be needed. Overdosage: If you think you've taken too much of this medicine contact a poison control center or emergency room at once. Overdosage: If you think you have taken too much of this medicine contact a poison control center or emergency room at once. NOTE: This medicine is only for you. Do not share this medicine with others. What if I miss a dose? This does not apply. What may interact with this medicine? Do not take this medicine with any of the following medications: -amprenavir -bosentan -fosamprenavir This medicine may also interact with the following medications: -barbiturate medicines for inducing sleep or treating seizures -certain medicines for fungal infections like ketoconazole and  itraconazole -griseofulvin -medicines to treat seizures like carbamazepine, felbamate, oxcarbazepine, phenytoin, topiramate -modafinil -phenylbutazone -rifampin -some medicines to treat HIV infection like atazanavir, indinavir, lopinavir, nelfinavir, tipranavir, ritonavir -St. John's wort This list may not describe all possible interactions. Give your health care provider a list of all the medicines, herbs, non-prescription drugs, or dietary supplements you use. Also tell them if you smoke, drink alcohol, or use illegal drugs. Some items may interact with your medicine. What should I watch for while using this medicine? This product does not protect you against HIV infection (AIDS) or other sexually transmitted diseases. You should be able to feel the implant by pressing your fingertips over the skin where it was inserted. Tell your doctor if you cannot feel the implant. What side effects may I notice from receiving this medicine? Side effects that you should report to your doctor or health care professional as soon as possible: -allergic reactions like skin rash, itching or hives, swelling of the face, lips, or tongue -breast lumps -changes in vision -confusion, trouble speaking or understanding -dark urine -depressed mood -general ill feeling or flu-like symptoms -light-colored stools -loss of appetite, nausea -right upper belly pain -severe headaches -severe pain, swelling, or tenderness in the abdomen -shortness of breath, chest pain, swelling in a leg -signs of pregnancy -sudden numbness or weakness of the face, arm or leg -trouble walking, dizziness, loss of balance or coordination -unusual vaginal bleeding, discharge -unusually weak or tired -yellowing of the eyes or skin Side effects that usually do not require medical attention (Report these to your doctor or health care professional if they continue or are bothersome.): -acne -breast pain -changes in  weight -cough -fever or chills -headache -irregular menstrual bleeding -itching, burning, and   vaginal discharge -pain or difficulty passing urine -sore throat This list may not describe all possible side effects. Call your doctor for medical advice about side effects. You may report side effects to FDA at 1-800-FDA-1088. Where should I keep my medicine? This drug is given in a hospital or clinic and will not be stored at home. NOTE: This sheet is a summary. It may not cover all possible information. If you have questions about this medicine, talk to your doctor, pharmacist, or health care provider.  2015, [employer]vier/[employer] Standard. (2012-01-13 15:37:45)  

## 2014-07-21 ENCOUNTER — Encounter: Payer: Self-pay | Admitting: *Deleted

## 2014-07-21 NOTE — Telephone Encounter (Signed)
Called pt and left message that we need to call the office to schedule an appt.

## 2014-07-25 ENCOUNTER — Encounter: Payer: Self-pay | Admitting: General Practice

## 2014-07-25 NOTE — Telephone Encounter (Signed)
Called patient, no answer- left message stating we are calling in regards to an appt you need to set up, please call us back at the clinics. Will send letter

## 2014-07-26 ENCOUNTER — Other Ambulatory Visit: Payer: Medicaid Other

## 2014-07-27 ENCOUNTER — Telehealth: Payer: Self-pay | Admitting: General Practice

## 2014-07-27 LAB — HCG, QUANTITATIVE, PREGNANCY: hCG, Beta Chain, Quant, S: 1374.5 m[IU]/mL

## 2014-07-27 NOTE — Telephone Encounter (Signed)
Dr Ihor Dow reviewed recent bhcg results, which are elevated indicating pregnancy. Patient needs to come in tomorrow, Thursday for repeat bhcg to monitor levels. Called patient at both numbers, no answer- left message that we are trying to reach you with results, please call us back at the clinics

## 2014-07-27 NOTE — Telephone Encounter (Signed)
Called patient and informed her of results and importance of bhcg level drawn tomorrow. Patient verbalized understanding and asked multiple times how could this be possible because she hasn't had sex since before she had the baby. Asked patient if she has had sexual contact or physical contact of any kind since having the baby. Patient states no multiple times. Asked patient if she was having bleeding or pain. Patient states no. Discussed importance of coming in tomorrow for bhcg to determine what could be occuring. Patient verbalized understanding and had no questions.

## 2014-07-28 ENCOUNTER — Other Ambulatory Visit: Payer: Medicaid Other

## 2014-07-28 ENCOUNTER — Telehealth: Payer: Self-pay | Admitting: Obstetrics & Gynecology

## 2014-07-28 DIAGNOSIS — E349 Endocrine disorder, unspecified: Secondary | ICD-10-CM

## 2014-07-28 LAB — HCG, QUANTITATIVE, PREGNANCY: HCG, BETA CHAIN, QUANT, S: 1922 m[IU]/mL

## 2014-07-28 NOTE — Telephone Encounter (Signed)
Pt with a h/o bleeding postpartum.  Her quant Hcg has been followed and have been rising.  Pt is adamant that she has not been sexually active since she delivered.  Pt reports no further bleeding or pain.   I am concerned that the level is rising despite no history clinically significant for pregnancy.  I am concerned for potential molar or choriocarcinoma.  Will obtain TV sono to reassess endometrium.    07/04/2014 CLINICAL DATA: Vaginal delivery 1 month ago. Continued pain, cramping and bleeding since delivery.  EXAM: TRANSABDOMINAL ULTRASOUND OF PELVIS  TECHNIQUE: Transabdominal ultrasound examination of the pelvis was performed including evaluation of the uterus, ovaries, adnexal regions, and pelvic cul-de-sac.  COMPARISON: None.  FINDINGS: Uterus  Measurements: 7.3 x 4.6 x 5.8 cm. No fibroids or other mass visualized.  Endometrium  Thickness: Thickened at 24 mm, heterogeneous. Internal blood flow noted.  Right ovary  Measurements: 2.3 x 1.6 x 2.1 cm. Normal appearance/no adnexal mass.  Left ovary  Measurements: Not visualized. No adnexal masses seen.  Other findings: No free fluid  IMPRESSION: Thickened, heterogeneous endometrium with internal blood flow. Findings suspicious for retained products of conception.

## 2014-08-02 ENCOUNTER — Other Ambulatory Visit: Payer: Self-pay | Admitting: Obstetrics & Gynecology

## 2014-08-02 ENCOUNTER — Ambulatory Visit (HOSPITAL_COMMUNITY)
Admission: RE | Admit: 2014-08-02 | Discharge: 2014-08-02 | Disposition: A | Discharge status: Home / Self Care | Payer: Medicaid Other | Source: Ambulatory Visit | Attending: Obstetrics & Gynecology | Admitting: Obstetrics & Gynecology

## 2014-08-02 DIAGNOSIS — O9089 Other complications of the puerperium, not elsewhere classified: Secondary | ICD-10-CM | POA: Diagnosis not present

## 2014-08-02 DIAGNOSIS — Z3201 Encounter for pregnancy test, result positive: Secondary | ICD-10-CM | POA: Insufficient documentation

## 2014-08-02 DIAGNOSIS — E349 Endocrine disorder, unspecified: Secondary | ICD-10-CM

## 2014-08-02 DIAGNOSIS — R7989 Other specified abnormal findings of blood chemistry: Secondary | ICD-10-CM

## 2014-08-04 ENCOUNTER — Ambulatory Visit (INDEPENDENT_AMBULATORY_CARE_PROVIDER_SITE_OTHER): Payer: Medicaid Other | Admitting: Family Medicine

## 2014-08-04 ENCOUNTER — Encounter: Payer: Self-pay | Admitting: Family Medicine

## 2014-08-04 ENCOUNTER — Telehealth: Payer: Self-pay | Admitting: *Deleted

## 2014-08-04 VITALS — BP 100/60 | HR 78 | Wt 91.0 lb

## 2014-08-04 DIAGNOSIS — K5900 Constipation, unspecified: Secondary | ICD-10-CM

## 2014-08-04 MED ORDER — POLYETHYLENE GLYCOL 3350 17 GM/SCOOP PO POWD: 17.0000 g | Freq: Every day | ORAL | Status: DC

## 2014-08-04 NOTE — Telephone Encounter (Signed)
Pt left message requesting [place] results from 2 days ago.  Message sent to Dr. Ihor Dow for review and advice to give to pt for plan of care.

## 2014-08-04 NOTE — Progress Notes (Signed)
   Subjective:    Patient ID: Shelly Koch, female    DOB: 21-Jun-1991, 24 y.o.   MRN: [id number]  HPI She has a 2 month history of difficulty with constipation. She has noted hard stools and occasional bleeding with some pain with a BM. She has not had any change in her eating habits. Her physical activity is unchanged. She is on no medications.   Review of Systems     Objective:   Physical Exam Alert and in no distress. Cardiac and lung exam normal. Abdominal exam shows no masses or tenderness.       Assessment & Plan:  Constipation, unspecified constipation type - Plan: polyethylene glycol powder (GLYCOLAX/MIRALAX) powder  slow encouraged her to drink plenty of fluids, get bulk in her diet and exercise. She is to use the MiraLAX regularly.

## 2014-08-08 ENCOUNTER — Encounter (HOSPITAL_COMMUNITY): Payer: Self-pay | Admitting: *Deleted

## 2014-08-09 ENCOUNTER — Encounter (HOSPITAL_COMMUNITY): Payer: Self-pay | Admitting: Anesthesiology

## 2014-08-09 NOTE — Anesthesia Preprocedure Evaluation (Addendum)
Anesthesia Evaluation  Patient identified by MRN, date of birth, ID band Patient awake    Reviewed: Allergy & Precautions, NPO status , Patient's Chart, lab work & pertinent test results  Airway Mallampati: II  TM Distance: >3 FB Neck ROM: Full    Dental no notable dental hx. (+) Teeth Intact   Pulmonary neg pulmonary ROS,  breath sounds clear to auscultation  Pulmonary exam normal       Cardiovascular negative cardio ROS  Rhythm:Regular Rate:Normal     Neuro/Psych PSYCHIATRIC DISORDERS negative neurological ROS  negative psych ROS   GI/Hepatic negative GI ROS, Neg liver ROS, malnutrition   Endo/Other  negative endocrine ROS  Renal/GU Renal disease     Musculoskeletal negative musculoskeletal ROS (+)   Abdominal   Peds  Hematology  (+) anemia ,   Anesthesia Other Findings   Reproductive/Obstetrics Suspected Retained POC                            Anesthesia Physical Anesthesia Plan  ASA: II  Anesthesia Plan: General   Post-op Pain Management:    Induction: Intravenous  Airway Management Planned: LMA  Additional Equipment:   Intra-op Plan:   Post-operative Plan: Extubation in OR  Informed Consent: I have reviewed the patients History and Physical, chart, labs and discussed the procedure including the risks, benefits and alternatives for the proposed anesthesia with the patient or authorized representative who has indicated his/her understanding and acceptance.   Dental advisory given  Plan Discussed with: Anesthesiologist, CRNA and Surgeon  Anesthesia Plan Comments: (No Zofran or Phenergan)        Anesthesia Quick Evaluation

## 2014-08-10 ENCOUNTER — Encounter (HOSPITAL_COMMUNITY)
Admission: RE | Disposition: A | Discharge status: Home / Self Care | Payer: Self-pay | Source: Ambulatory Visit | Attending: Obstetrics & Gynecology

## 2014-08-10 ENCOUNTER — Encounter (HOSPITAL_COMMUNITY): Payer: Self-pay | Admitting: Anesthesiology

## 2014-08-10 ENCOUNTER — Ambulatory Visit (HOSPITAL_COMMUNITY): Payer: Medicaid Other | Admitting: Anesthesiology

## 2014-08-10 ENCOUNTER — Ambulatory Visit (HOSPITAL_COMMUNITY)
Admission: RE | Admit: 2014-08-10 | Discharge: 2014-08-10 | Disposition: A | Discharge status: Home / Self Care | Payer: Medicaid Other | Source: Ambulatory Visit | Attending: Obstetrics & Gynecology | Admitting: Obstetrics & Gynecology

## 2014-08-10 DIAGNOSIS — R938 Abnormal findings on diagnostic imaging of other specified body structures: Secondary | ICD-10-CM | POA: Insufficient documentation

## 2014-08-10 DIAGNOSIS — Z791 Long term (current) use of non-steroidal anti-inflammatories (NSAID): Secondary | ICD-10-CM | POA: Insufficient documentation

## 2014-08-10 DIAGNOSIS — N289 Disorder of kidney and ureter, unspecified: Secondary | ICD-10-CM | POA: Insufficient documentation

## 2014-08-10 HISTORY — PX: DILATION AND EVACUATION: SHX1459

## 2014-08-10 HISTORY — PX: HYSTEROSCOPY: SHX211

## 2014-08-10 LAB — CBC
HCT: 35.9 % — ABNORMAL LOW (ref 36.0–46.0)
HEMOGLOBIN: 11.9 g/dL — AB (ref 12.0–15.0)
MCH: 31.6 pg (ref 26.0–34.0)
MCHC: 33.1 g/dL (ref 30.0–36.0)
MCV: 95.5 fL (ref 78.0–100.0)
Platelets: 185 10*3/uL (ref 150–400)
RBC: 3.76 MIL/uL — ABNORMAL LOW (ref 3.87–5.11)
RDW: 12.1 % (ref 11.5–15.5)
WBC: 6.8 10*3/uL (ref 4.0–10.5)

## 2014-08-10 SURGERY — DILATION AND EVACUATION, UTERUS
Anesthesia: General | Site: Vagina

## 2014-08-10 MED ORDER — KETOROLAC TROMETHAMINE 30 MG/ML IJ SOLN
INTRAMUSCULAR | Status: DC | PRN
  Administered 2014-08-10 (×2): 15 mg via INTRAVENOUS

## 2014-08-10 MED ORDER — OXYCODONE-ACETAMINOPHEN 5-325 MG PO TABS: 1.0000 | ORAL_TABLET | Freq: Four times a day (QID) | ORAL | Status: DC | PRN

## 2014-08-10 MED ORDER — DEXAMETHASONE SODIUM PHOSPHATE 4 MG/ML IJ SOLN
INTRAMUSCULAR | Status: AC
  Filled 2014-08-10: qty 1

## 2014-08-10 MED ORDER — MIDAZOLAM HCL 2 MG/2ML IJ SOLN
INTRAMUSCULAR | Status: AC
  Filled 2014-08-10: qty 2

## 2014-08-10 MED ORDER — FENTANYL CITRATE 0.05 MG/ML IJ SOLN
INTRAMUSCULAR | Status: DC | PRN
  Administered 2014-08-10: 25 ug via INTRAVENOUS
  Administered 2014-08-10: 50 ug via INTRAVENOUS

## 2014-08-10 MED ORDER — SCOPOLAMINE 1 MG/3DAYS TD PT72: 1.0000 | MEDICATED_PATCH | Freq: Once | TRANSDERMAL | Status: DC

## 2014-08-10 MED ORDER — BUPIVACAINE HCL (PF) 0.5 % IJ SOLN
INTRAMUSCULAR | Status: AC
  Filled 2014-08-10: qty 30

## 2014-08-10 MED ORDER — BUPIVACAINE HCL (PF) 0.5 % IJ SOLN
INTRAMUSCULAR | Status: DC | PRN
  Administered 2014-08-10: 20 mL

## 2014-08-10 MED ORDER — IBUPROFEN 600 MG PO TABS: 600.0000 mg | ORAL_TABLET | Freq: Four times a day (QID) | ORAL | Status: DC | PRN

## 2014-08-10 MED ORDER — FLUMAZENIL 0.5 MG/5ML IV SOLN
INTRAVENOUS | Status: AC
  Filled 2014-08-10: qty 5

## 2014-08-10 MED ORDER — PROPOFOL 10 MG/ML IV BOLUS
INTRAVENOUS | Status: AC
  Filled 2014-08-10: qty 20

## 2014-08-10 MED ORDER — MEPERIDINE HCL 25 MG/ML IJ SOLN: 6.2500 mg | INTRAMUSCULAR | Status: DC | PRN

## 2014-08-10 MED ORDER — FLUMAZENIL 0.5 MG/5ML IV SOLN
INTRAVENOUS | Status: DC | PRN
  Administered 2014-08-10: 0.2 mg via INTRAVENOUS

## 2014-08-10 MED ORDER — DEXAMETHASONE SODIUM PHOSPHATE 10 MG/ML IJ SOLN
INTRAMUSCULAR | Status: DC | PRN
  Administered 2014-08-10: 4 mg via INTRAVENOUS

## 2014-08-10 MED ORDER — LACTATED RINGERS IV SOLN
INTRAVENOUS | Status: DC
  Administered 2014-08-10: 08:00:00 via INTRAVENOUS

## 2014-08-10 MED ORDER — FENTANYL CITRATE 0.05 MG/ML IJ SOLN
INTRAMUSCULAR | Status: AC
  Filled 2014-08-10: qty 2

## 2014-08-10 MED ORDER — FENTANYL CITRATE 0.05 MG/ML IJ SOLN: 25.0000 ug | INTRAMUSCULAR | Status: DC | PRN

## 2014-08-10 MED ORDER — LIDOCAINE HCL (CARDIAC) 20 MG/ML IV SOLN
INTRAVENOUS | Status: AC
  Filled 2014-08-10: qty 5

## 2014-08-10 MED ORDER — METOCLOPRAMIDE HCL 5 MG/ML IJ SOLN: 10.0000 mg | Freq: Once | INTRAMUSCULAR | Status: DC | PRN

## 2014-08-10 MED ORDER — KETOROLAC TROMETHAMINE 30 MG/ML IJ SOLN
INTRAMUSCULAR | Status: AC
  Filled 2014-08-10: qty 1

## 2014-08-10 MED ORDER — LIDOCAINE HCL (CARDIAC) 20 MG/ML IV SOLN
INTRAVENOUS | Status: DC | PRN
  Administered 2014-08-10: 20 mg via INTRAVENOUS
  Administered 2014-08-10: 60 mg via INTRAVENOUS

## 2014-08-10 MED ORDER — DOXYCYCLINE HYCLATE 100 MG PO CAPS: 100.0000 mg | ORAL_CAPSULE | Freq: Two times a day (BID) | ORAL | Status: DC

## 2014-08-10 MED ORDER — MIDAZOLAM HCL 2 MG/2ML IJ SOLN
INTRAMUSCULAR | Status: DC | PRN
  Administered 2014-08-10: 1 mg via INTRAVENOUS

## 2014-08-10 MED ORDER — PROPOFOL 10 MG/ML IV BOLUS
INTRAVENOUS | Status: DC | PRN
  Administered 2014-08-10: 120 mg via INTRAVENOUS

## 2014-08-10 SURGICAL SUPPLY — 23 items
CATH ROBINSON RED A/P 16FR (CATHETERS) ×3 IMPLANT
CLOTH BEACON ORANGE TIMEOUT ST (SAFETY) ×3 IMPLANT
CONTAINER PREFILL 10% NBF 60ML (FORM) IMPLANT
DECANTER SPIKE VIAL GLASS SM (MISCELLANEOUS) ×3 IMPLANT
GLOVE BIO SURGEON STRL SZ7 (GLOVE) ×3 IMPLANT
GLOVE BIOGEL PI IND STRL 7.0 (GLOVE) ×2 IMPLANT
GLOVE BIOGEL PI INDICATOR 7.0 (GLOVE) ×1
GLOVE ECLIPSE 7.0 STRL STRAW (GLOVE) ×6 IMPLANT
GOWN STRL REUS W/TWL LRG LVL3 (GOWN DISPOSABLE) ×6 IMPLANT
KIT BERKELEY 1ST TRIMESTER 3/8 (MISCELLANEOUS) ×3 IMPLANT
NS IRRIG 1000ML POUR BTL (IV SOLUTION) ×3 IMPLANT
PACK VAGINAL MINOR WOMEN LF (CUSTOM PROCEDURE TRAY) ×3 IMPLANT
PAD OB MATERNITY 4.3X12.25 (PERSONAL CARE ITEMS) ×3 IMPLANT
PAD PREP 24X48 CUFFED NSTRL (MISCELLANEOUS) ×3 IMPLANT
SET BERKELEY SUCTION TUBING (SUCTIONS) ×3 IMPLANT
TOWEL OR 17X24 6PK STRL BLUE (TOWEL DISPOSABLE) ×6 IMPLANT
TUBING AQUILEX INFLOW (TUBING) ×3 IMPLANT
TUBING AQUILEX OUTFLOW (TUBING) ×3 IMPLANT
VACURETTE 10 RIGID CVD (CANNULA) IMPLANT
VACURETTE 7MM CVD STRL WRAP (CANNULA) IMPLANT
VACURETTE 8 RIGID CVD (CANNULA) IMPLANT
VACURETTE 9 RIGID CVD (CANNULA) IMPLANT
WATER STERILE IRR 1000ML POUR (IV SOLUTION) ×3 IMPLANT

## 2014-08-10 NOTE — H&P (Signed)
  HPI Was seen in [facility] for retained POC and endometritis. Has been on methergine and Augmentin. Has passed some tissue and is now spotting. Has no other complaints. Despite observaztion she still has a rising HCG.    Review of Systems  Constitutional: Negative for fever, chills and fatigue.  Gastrointestinal: Negative for nausea, vomiting, abdominal pain, diarrhea and constipation.   Past Medical History  Diagnosis Date  . Pyelonephritis    Past Surgical History  Procedure Laterality Date  . No past surgeries     No current facility-administered medications on file prior to encounter.   Current Outpatient Prescriptions on File Prior to Encounter  Medication Sig Dispense Refill  . ibuprofen (ADVIL,MOTRIN) 600 MG tablet Take 1 tablet (600 mg total) by mouth every 6 (six) hours as needed. (Patient not taking: Reported on 08/09/2014) 30 tablet 1  . methylergonovine (METHERGINE) 0.2 MG tablet Take 1 tablet (0.2 mg total) by mouth 4 (four) times daily. (Patient not taking: Reported on 08/09/2014) 28 tablet 0  . polyethylene glycol powder (GLYCOLAX/MIRALAX) powder Take 17 g by mouth daily. 3350 g 5   Allergies  Allergen Reactions  . Ginger Hives and Itching    Pt states "not allergic"  . [place]     Hives and itching  . Ondansetron Hives and Itching  . Promethazine Hives and Itching        Objective:   Physical Exam BP 96/81 mmHg  Pulse 72  Temp(Src) 98.6 F (37 C) (Oral)  Resp 20  SpO2 100%  Constitutional: She appears well-developed and well-nourished.  Pulmonary/Chest: Effort normal and breath sounds normal.  Abdominal: Soft. Bowel sounds are normal. She exhibits no distension and no mass. There is no tenderness. There is no rebound and no guarding.  Skin: Skin is warm and dry.  Psychiatric: She has a normal mood and affect. Her behavior is normal. Judgment and thought content normal.      Assessment & Plan:  Retained POC  Patient desires surgical management with  hysteroscopy with dilation and evacuation.  The risks of surgery were discussed in detail with the patient including but not limited to: bleeding which may require transfusion or reoperation; infection which may require prolonged hospitalization or re-hospitalization and antibiotic therapy; injury to bowel, bladder, ureters and major vessels or other surrounding organs; need for additional procedures including laparotomy; thromboembolic phenomenon, incisional problems and other postoperative or anesthesia complications.  Patient was told that the likelihood that her condition and symptoms will be treated effectively with this surgical management was very high; the postoperative expectations were also discussed in detail. The patient also understands the alternative treatment options which were discussed in full. All questions were answered.

## 2014-08-10 NOTE — Anesthesia Postprocedure Evaluation (Signed)
  Anesthesia Post-op Note  Patient: Shelly Koch  Procedure(s) Performed: Procedure(s): DILATATION AND EVACUATION (N/A) HYSTEROSCOPY (N/A)  Patient Location: PACU  Anesthesia Type:General  Level of Consciousness: awake, alert  and oriented  Airway and Oxygen Therapy: Patient Spontanous Breathing  Post-op Pain: none  Post-op Assessment: Post-op Vital signs reviewed, Patient's Cardiovascular Status Stable, Respiratory Function Stable, Patent Airway, No signs of Nausea or vomiting and Pain level controlled  Post-op Vital Signs: Reviewed and stable  Last Vitals:  Filed Vitals:   08/10/14 1015  BP:   Pulse: 66  Temp:   Resp: 12    Complications: No apparent anesthesia complications

## 2014-08-10 NOTE — Discharge Instructions (Signed)
Dilation and Curettage or Vacuum Curettage, Care After Refer to this sheet in the next few weeks. These instructions provide you with information on caring for yourself after your procedure. Your health care provider may also give you more specific instructions. Your treatment has been planned according to current medical practices, but problems sometimes occur. Call your health care provider if you have any problems or questions after your procedure. WHAT TO EXPECT AFTER THE PROCEDURE After your procedure, it is typical to have light cramping and bleeding. This may last for 2 days to 2 weeks after the procedure. HOME CARE INSTRUCTIONS   Do not drive for 24 hours.  Wait 1 week before returning to strenuous activities.  Take your temperature 2 times a day for 4 days and write it down. Provide these temperatures to your health care provider if you develop a fever.  Avoid long periods of standing.  Avoid heavy lifting, pushing, or pulling. Do not lift anything heavier than 10 pounds (4.5 kg).  Limit stair climbing to once or twice a day.  Take rest periods often.  You may resume your usual diet.  Drink enough fluids to keep your urine clear or pale yellow.  Your usual bowel function should return. If you have constipation, you may:  Take a mild laxative with permission from your health care provider.  Add fruit and bran to your diet.  Drink more fluids.  Take showers instead of baths until your health care provider gives you permission to take baths.  Do not go swimming or use a hot tub until your health care provider approves.  Try to have someone with you or available to you the first 24-48 hours, especially if you were given a general anesthetic.  Do not douche, use tampons, or have sex (intercourse) for 2 weeks after the procedure.  Only take over-the-counter or prescription medicines as directed by your health care provider. Do not take aspirin. It can cause  bleeding.  Follow up with your health care provider as directed. SEEK MEDICAL CARE IF:   You have increasing cramps or pain that is not relieved with medicine.  You have abdominal pain that does not seem to be related to the same area of earlier cramping and pain.  You have bad smelling vaginal discharge.  You have a rash.  You are having problems with any medicine. SEEK IMMEDIATE MEDICAL CARE IF:   You have bleeding that is heavier than a normal menstrual period.  You have a fever.  You have chest pain.  You have shortness of breath.  You feel dizzy or feel like fainting.  You pass out.  You have pain in your shoulder strap area.  You have heavy vaginal bleeding with or without blood clots. MAKE SURE YOU:   Understand these instructions.  Will watch your condition.  Will get help right away if you are not doing well or get worse. Document Released: 07/05/2000 Document Revised: 07/13/2013 Document Reviewed: 02/04/2013 [facility] Patient Information 2015 [place], [place]. This information is not intended to replace advice given to you by your health care provider. Make sure you discuss any questions you have with your health care provider. DISCHARGE INSTRUCTIONS: D&C / D&E The following instructions have been prepared to help you care for yourself upon your return home.   Personal hygiene:  Use sanitary pads for vaginal drainage, not tampons.  Shower the day after your procedure.  NO tub baths, pools or Jacuzzis for 2-3 weeks.  Wipe front to back  after using the bathroom.  Activity and limitations:  Do NOT drive or operate any equipment for 24 hours. The effects of anesthesia are still present and drowsiness may result.  Do NOT rest in bed all day.  Walking is encouraged.  Walk up and down stairs slowly.  You may resume your normal activity in one to two days or as indicated by your physician.  Sexual activity: NO intercourse for at least 2 weeks after the  procedure, or as indicated by your physician.  Diet: Eat a light meal as desired this evening. You may resume your usual diet tomorrow.  Return to work: You may resume your work activities in one to two days or as indicated by your doctor.  What to expect after your surgery: Expect to have vaginal bleeding/discharge for 2-3 days and spotting for up to 10 days. It is not unusual to have soreness for up to 1-2 weeks. You may have a slight burning sensation when you urinate for the first day. Mild cramps may continue for a couple of days. You may have a regular period in 2-6 weeks.  Call your doctor for any of the following:  Excessive vaginal bleeding, saturating and changing one pad every hour.  Inability to urinate 6 hours after discharge from hospital.  Pain not relieved by pain medication.  Fever of 100.4 F or greater.  Unusual vaginal discharge or odor.   Call for an appointment:    Patients signature: ______________________  Nurses signature ________________________  Support person's signature_______________________

## 2014-08-10 NOTE — Transfer of Care (Signed)
Immediate Anesthesia Transfer of Care Note  Patient: Shelly Koch  Procedure(s) Performed: Procedure(s): DILATATION AND EVACUATION (N/A) HYSTEROSCOPY (N/A)  Patient Location: PACU  Anesthesia Type:General  Level of Consciousness: awake, sedated and patient cooperative  Airway & Oxygen Therapy: Patient Spontanous Breathing and Patient connected to nasal cannula oxygen  Post-op Assessment: Report given to PACU RN and Post -op Vital signs reviewed and stable  Post vital signs: Reviewed and stable  Complications: No apparent anesthesia complications

## 2014-08-10 NOTE — Brief Op Note (Signed)
08/10/2014  9:00 AM  PATIENT:  Shelly Koch  24 y.o. female  PRE-OPERATIVE DIAGNOSIS:   Suspicious for retained products of conception.  Thickened, heterogeneous endometrium with internal blood flow.  POST-OPERATIVE DIAGNOSIS:  Suspicious for retained products of conception.  Thickened,  PROCEDURE:  Procedure(s): DILATATION AND EVACUATION (N/A) HYSTEROSCOPY (N/A)  SURGEON:  Surgeon(s) and Role:    * Lavonia Drafts, MD - Primary  ANESTHESIA:   general and paracervical block  EBL:  Total I/O In: 300 [I.V.:300] Out: 100 [Urine:100]  BLOOD ADMINISTERED:none  DRAINS: none   LOCAL MEDICATIONS USED:  MARCAINE     SPECIMEN:  Source of Specimen:  endometrial currettings   DISPOSITION OF SPECIMEN:  PATHOLOGY  COUNTS:  YES  TOURNIQUET:  * No tourniquets in log *  DICTATION: .Note written in EPIC  PLAN OF CARE: Discharge to home after PACU  PATIENT DISPOSITION:  PACU - hemodynamically stable.   Delay start of Pharmacological VTE agent (>24hrs) due to surgical blood loss or risk of bleeding: not applicable

## 2014-08-10 NOTE — Anesthesia Procedure Notes (Signed)
Procedure Name: LMA Insertion Date/Time: 08/10/2014 8:31 AM Performed by: Tobin Chad Pre-anesthesia Checklist: Patient identified, Timeout performed, Emergency Drugs available, Suction available and Patient being monitored Patient Re-evaluated:Patient Re-evaluated prior to inductionOxygen Delivery Method: Circle system utilized Preoxygenation: Pre-oxygenation with 100% oxygen Intubation Type: IV induction Ventilation: Mask ventilation without difficulty LMA Size: 3.0 Grade View: Grade II Number of attempts: 1 Placement Confirmation: positive ETCO2 and breath sounds checked- equal and bilateral Dental Injury: Teeth and Oropharynx as per pre-operative assessment

## 2014-08-10 NOTE — Op Note (Signed)
08/10/2014  9:00 AM  PATIENT:  Shelly Koch  24 y.o. female  PRE-OPERATIVE DIAGNOSIS:   Suspicious for retained products of conception.  Thickened, heterogeneous endometrium with internal blood flow.  POST-OPERATIVE DIAGNOSIS:  Suspicious for retained products of conception.  Thickened,  PROCEDURE:  Procedure(s): DILATATION AND EVACUATION (N/A) HYSTEROSCOPY (N/A)  SURGEON:  Surgeon(s) and Role:    * Lavonia Drafts, MD - Primary  ANESTHESIA:   general and paracervical block  EBL:  Total I/O In: 300 [I.V.:300] Out: 100 [Urine:100]  BLOOD ADMINISTERED:none  DRAINS: none   LOCAL MEDICATIONS USED:  MARCAINE     SPECIMEN:  Source of Specimen:  endometrial currettings   DISPOSITION OF SPECIMEN:  PATHOLOGY  COUNTS:  YES  TOURNIQUET:  * No tourniquets in log *  DICTATION: .Note written in EPIC  PLAN OF CARE: Discharge to home after PACU  PATIENT DISPOSITION:  PACU - hemodynamically stable.   Delay start of Pharmacological VTE agent (>24hrs) due to surgical blood loss or risk of bleeding: not applicable  INDICATIONS: 24 y.o. G1P1001 who is eight weeks post partum with a slowly rising hcg level.  Pt with no h/o being sexually active since delivery of infant.   Risks of surgery were discussed with the patient including but not limited to: bleeding which may require transfusion; infection which may require antibiotics; injury to uterus or surrounding organs; need for additional procedures including laparotomy or laparoscopy; possibility of intrauterine scarring which may impair future fertility; and other postoperative/anesthesia complications. Written informed consent was obtained.    FINDINGS:  A 6week size uterus, very small amount of tissue in uterus.  COMPLICATIONS:  None immediate.  PROCEDURE DETAILS:  The patient was taken to the operating room where monitored intravenous sedation was administered and was found to be adequate.  After an adequate timeout was  performed, she was placed in the dorsal lithotomy position and examined; then prepped and draped in the sterile manner.   Her bladder was catheterized for an unmeasured amount of clear, yellow urine. A vaginal speculum was then placed in the patient's vagina and a single tooth tenaculum was applied to the anterior lip of the cervix.  A paracervical block using 20 ml of 0.5% Marcaine was administered. The cervix was gently dilated to accommodate a hysteroscope.  The hysteroscope was inserted under direct visualization.  The above finding were noted.  A curette was used until a slightly gritty texture was noted.  At this point. A 7 mm suction curette that was gently advanced to the uterine fundus.  The suction device was then activated to a suction of 50-14mmHg and curette slowly rotated to clear the uterus of products of conception. There was minimal bleeding noted and the tenaculum removed with good hemostasis noted.   All instruments were removed from the patient's vagina. The patient tolerated the procedure well and was taken to the recovery area awake, and in stable condition.  The patient will be discharged to home as per PACU criteria.  Routine postoperative instructions given.  She was prescribed Percocet, Ibuprofen and Doxycycline.  She will follow up in the clinic in 2 weeks for postoperative evaluation.

## 2014-08-11 ENCOUNTER — Encounter (HOSPITAL_COMMUNITY): Payer: Self-pay | Admitting: Obstetrics & Gynecology

## 2014-08-25 ENCOUNTER — Encounter: Payer: Self-pay | Admitting: Obstetrics & Gynecology

## 2014-08-25 ENCOUNTER — Telehealth: Payer: Self-pay | Admitting: General Practice

## 2014-08-25 ENCOUNTER — Ambulatory Visit (INDEPENDENT_AMBULATORY_CARE_PROVIDER_SITE_OTHER): Payer: Medicaid Other | Admitting: Obstetrics & Gynecology

## 2014-08-25 VITALS — BP 131/69 | HR 81 | Temp 97.5°F | Ht <= 58 in | Wt 90.2 lb

## 2014-08-25 DIAGNOSIS — R42 Dizziness and giddiness: Secondary | ICD-10-CM

## 2014-08-25 DIAGNOSIS — G44229 Chronic tension-type headache, not intractable: Secondary | ICD-10-CM

## 2014-08-25 DIAGNOSIS — R5383 Other fatigue: Secondary | ICD-10-CM

## 2014-08-25 DIAGNOSIS — E349 Endocrine disorder, unspecified: Secondary | ICD-10-CM

## 2014-08-25 DIAGNOSIS — Z9889 Other specified postprocedural states: Secondary | ICD-10-CM

## 2014-08-25 MED ORDER — BUTALBITAL-APAP-CAFFEINE 50-500-40 MG PO TABS: 1.0000 | ORAL_TABLET | ORAL | Status: DC | PRN

## 2014-08-25 NOTE — Telephone Encounter (Signed)
Called patient and apologized that a lab she needed drawn was missed and she will need to come back to our office to have this done. Patient verbalized understanding and said okay I'll come back later. Patient had no other questions

## 2014-08-25 NOTE — Progress Notes (Signed)
Subjective:     Patient ID: Shelly Koch, female   DOB: 1991/07/17, 24 y.o.   MRN: [id number]  HPI Pt c/o nausea since procedure.  She also c/o HA and fatigue. She reports that baby is only waking once at night and is breast feeding.  She reports that she just does not feel well.   Review of Systems     Objective:   Physical Exam BP 131/69 mmHg  Pulse 81  Temp(Src) 97.5 F (36.4 C)  Ht 4\' 10"  (1.473 m)  Wt 90 lb 3.2 oz (40.914 kg)  BMI 18.86 kg/m2  Breastfeeding? No  Pt in NAD Lungs: CTA CV: RRR Abd; soft, NT, ND     Assessment:     Abnormal rise in HCG PP.  Now, s/p D&C.  Will recheck quant HCG.  Also want to r/o thyroid disorders.     Plan:     Lab: Quant HCG, CBC, TSH Fioricet 1-2 po q 4-6 hours prn HA F/u in 2 weeks or sooner prn

## 2014-08-25 NOTE — Progress Notes (Signed)
Patient ID: Shelly Koch, female   DOB: November 09, 1990, 24 y.o.   MRN: [id number] Pt states she is having headache daily and vomits two to three times daily, she thinks it is related to her Nexplanon.  Pt states she feels tired on the left side of her body only.

## 2014-08-25 NOTE — Patient Instructions (Signed)

## 2014-08-26 ENCOUNTER — Telehealth: Payer: Self-pay | Admitting: General Practice

## 2014-08-26 LAB — TSH: TSH: 2.176 u[IU]/mL (ref 0.350–4.500)

## 2014-08-26 LAB — HCG, QUANTITATIVE, PREGNANCY: hCG, Beta Chain, Quant, S: 4856.2 m[IU]/mL

## 2014-08-26 NOTE — Telephone Encounter (Signed)
Called patient, no answer- left message stating we are calling to remind her that she needs to come by next week, Monday if she can for the lab draw and to call us back if she has any questions

## 2014-08-29 ENCOUNTER — Other Ambulatory Visit: Payer: Medicaid Other

## 2014-08-30 ENCOUNTER — Encounter: Payer: Self-pay | Admitting: Obstetrics & Gynecology

## 2014-08-30 LAB — CBC
HCT: 37.1 % (ref 36.0–46.0)
Hemoglobin: 12.2 g/dL (ref 12.0–15.0)
MCH: 31 pg (ref 26.0–34.0)
MCHC: 32.9 g/dL (ref 30.0–36.0)
MCV: 94.2 fL (ref 78.0–100.0)
MPV: 11.5 fL (ref 8.6–12.4)
Platelets: 234 10*3/uL (ref 150–400)
RBC: 3.94 MIL/uL (ref 3.87–5.11)
RDW: 12.2 % (ref 11.5–15.5)
WBC: 7.7 10*3/uL (ref 4.0–10.5)

## 2014-08-31 ENCOUNTER — Telehealth: Payer: Self-pay | Admitting: Obstetrics & Gynecology

## 2014-08-31 NOTE — Telephone Encounter (Signed)
Pt called back.  I explained to her that her HCG is continuing to rise dispite the D&E.  She reports that she has the Nexplanon but, has still not been sexually active since the delivery of her child.   I explained to her that I have consulted GYN/ONC and am awaiting their reply for recommendations for further managemet.  Pt was able to repeat back to me what I explained. To her. She had no further sx or questions.  I have asked her to call back if she does not hear from [place] by Monday.  Yaminah Clayborn L. Harraway-Smith, M.D., Cherlynn June

## 2014-08-31 NOTE — Telephone Encounter (Signed)
TC to pt to review increasing bHCG.  Left message for pt to call back.  Cambrie Sonnenfeld L. Harraway-Smith, M.D., Cherlynn June

## 2014-09-07 ENCOUNTER — Telehealth: Payer: Self-pay | Admitting: General Practice

## 2014-09-07 ENCOUNTER — Other Ambulatory Visit: Payer: Self-pay | Admitting: Obstetrics & Gynecology

## 2014-09-07 DIAGNOSIS — E349 Endocrine disorder, unspecified: Secondary | ICD-10-CM

## 2014-09-07 NOTE — Telephone Encounter (Signed)
Gyn/onc called back and set up new patient appt for 2/24 @ 10:30, patient to arrive at 10am. Will see Dr Denman George. Next available afternoon would be 2/29. Called patient with appt- no answer. Left message that we are trying to reach you with an appt. Please call us back at the clinics

## 2014-09-07 NOTE — Telephone Encounter (Signed)
Patient called and left message stating she is a patient of Dr Maylene Roes Smith's and was told to call us. Called GYN/ONC no answer- left message to call us back at the clinics to set up patient's appt. Called patient and informed her I couldn't reach anyone at the doctor's office we are sending her to but left message with them to call us back so we can set up her appt. Patient verbalized understanding and states she prefers afternoon appts. Patient will await our call with appt with GYN/ONC

## 2014-09-07 NOTE — Telephone Encounter (Signed)
Scheduled CT for 2/19 @ 915. Patient will need to come prior to then to pick up contrast dye. Called patient and informed her of all appointments and locations. Patient verbalized understanding to all and had no questions

## 2014-09-09 ENCOUNTER — Ambulatory Visit (HOSPITAL_COMMUNITY)
Admission: RE | Admit: 2014-09-09 | Discharge: 2014-09-09 | Disposition: A | Discharge status: Home / Self Care | Payer: Medicaid Other | Source: Ambulatory Visit | Attending: Obstetrics & Gynecology | Admitting: Obstetrics & Gynecology

## 2014-09-09 ENCOUNTER — Encounter (INDEPENDENT_AMBULATORY_CARE_PROVIDER_SITE_OTHER): Payer: Self-pay

## 2014-09-09 DIAGNOSIS — E349 Endocrine disorder, unspecified: Secondary | ICD-10-CM | POA: Insufficient documentation

## 2014-09-09 MED ORDER — IOHEXOL 300 MG/ML  SOLN
100.0000 mL | Freq: Once | INTRAMUSCULAR | Status: AC | PRN
  Administered 2014-09-09: 100 mL via INTRAVENOUS

## 2014-09-12 ENCOUNTER — Ambulatory Visit: Payer: Medicaid Other | Admitting: Obstetrics & Gynecology

## 2014-09-14 ENCOUNTER — Ambulatory Visit: Payer: Self-pay

## 2014-09-14 ENCOUNTER — Ambulatory Visit: Payer: Medicaid Other | Attending: Gynecologic Oncology | Admitting: Gynecologic Oncology

## 2014-09-14 ENCOUNTER — Encounter: Payer: Self-pay | Admitting: Gynecologic Oncology

## 2014-09-14 VITALS — BP 127/74 | HR 80 | Temp 98.1°F | Resp 20 | Ht <= 58 in | Wt 91.7 lb

## 2014-09-14 DIAGNOSIS — E349 Endocrine disorder, unspecified: Secondary | ICD-10-CM | POA: Insufficient documentation

## 2014-09-14 DIAGNOSIS — R7989 Other specified abnormal findings of blood chemistry: Secondary | ICD-10-CM

## 2014-09-14 NOTE — Progress Notes (Signed)
Consult Note: Gyn-Onc  Consult was requested by Dr. Ihor Dow for the evaluation of Shelly Koch 24 y.o. female with rising HCG levels postpartum  CC:  Chief Complaint  Patient presents with  . elevated serum hCG in female, non pregnant    Assessment/Plan:  [name]  is a 24 y.o.  year old with either retained products of conception or post pregnancy GTN. I favor the former given the relatively slow growing in her hCG levels. I would expect more than doubling of her hCG in a one-month period if she had untreated GTN.   I am reassured by the normal CT findings. I do not believe that the chest nodules represent metastatic choriocarcinoma. However, if today's hCG shows persistent elevation beyond the level from February 4, I'm going to recommend a course of methotrexate chemotherapy for the diagnosis of GTN. If her hCGs overall it is lower than its last reading we can continue to follow.  HPI: Shelly Koch is a 24 year old gravida 1 para 1 who is 3 months status post delivery of a full-term female infant on 06/05/2014. This pregnancy was complicated by a twin pregnancy with loss of the second twin at a proximally [redacted] weeks gestational age. She also had gestational hypertension during this pregnancy. Her delivery was vaginal and per patient was uncomplicated with no retained placenta in the third stage of labor.  In December 2015 when she was one month postpartum she was experiencing abdominal pain and was seen in the emergency room for this. HCG levels were drawn at this time and was elevated.   HCG 07/04/14: 389  She was recommended to follow-up with Dr. Purvis Kilts for this elevated hCG and abdominal pain and she did so. In January she sore Dr. Purvis Kilts to repeated the hCG demonstrating that it had increased substantially. HCG 07/26/14: 0601 HCG 07/30/14: 1922   To follow-up these rises in hCG she also underwent ultrasound scan. Ultrasound 08/02/14:  No intrauterine gestational  sac identified. A focal hyperechoic area is seen within the central endometrium which measures 1.1 x 0.5 x 0.8 cm. This has decreased since prior study, but is suspicious for a persistent small focus of retained products of conception. No myometrial masses are identified.  The right ovary is normal in appearance. A complex cystic lesion is again seen in the left ovary which contains a fine reticular pattern of internal echoes, without blood flow on color Doppler ultrasound. This measures 3.3 x 2.5 x 2.7 cm, and has findings consistent with a benign hemorrhagic cyst.   It was felt that the elevated hCG and ultrasound findings were representative retained products of conception. Therefore she underwent a D&C hysteroscopy 08/10/14: 6 week size uterus with small amount of tissue in cavity.  Postprocedure hCG levels were drawn and were surprisingly double what they had been 1 month prior. HCG 08/25/14: 4856  To follow-up these doubling hCG levels she underwent CT evaluation of the chest abdomen and pelvis to look for metastatic foci of trophoblastic tissue. CT chest/abdo/pelvis 09/09/14 : A few tiny sub-cm pulmonary nodules are seen scattered in the left upper and lower lobes, most likely postinflammatory in etiology in patient of this age. Normal/unremarkable uterus tubes and ovaries. No evidence for metastatic disease.  Interval History: The patient has had no vaginal bleeding since surgery. She continues to have no vaginal bleeding. She had an Implanon placed for birth control which is secreting progesterone and this is placed in her upper extremity. This was placed in December 2015.  She experiences daily cramping abdominal pain for which she takes ibuprofen occasionally narcotics. This improves her pain somewhat. She also reports that she has some constipation since delivery.  Current Meds:  Outpatient Encounter Prescriptions as of 09/14/2014  Medication Sig  . ibuprofen (ADVIL,MOTRIN)  600 MG tablet Take 1 tablet (600 mg total) by mouth every 6 (six) hours as needed.  . polyethylene glycol powder (GLYCOLAX/MIRALAX) powder Take 17 g by mouth daily.  . butalbital-acetaminophen-caffeine (ESGIC PLUS) 50-500-40 MG per tablet Take 1-2 tablets by mouth every 4 (four) hours as needed for pain or headache. (Patient not taking: Reported on 09/14/2014)  . oxyCODONE-acetaminophen (PERCOCET/ROXICET) 5-325 MG per tablet Take 1-2 tablets by mouth every 6 (six) hours as needed. (Patient not taking: Reported on 08/25/2014)  . [DISCONTINUED] doxycycline (VIBRAMYCIN) 100 MG capsule Take 1 capsule (100 mg total) by mouth 2 (two) times daily.    Allergy:  Allergies  Allergen Reactions  . Ginger Hives and Itching    Pt states "not allergic"  . [place]     Hives and itching  . Ondansetron Hives and Itching  . Promethazine Hives and Itching    Social Hx:   History   Social History  . Marital Status: Single    Spouse Name: N/A  . Number of Children: N/A  . Years of Education: N/A   Occupational History  . Not on file.   Social History Main Topics  . Smoking status: Never Smoker   . Smokeless tobacco: Never Used  . Alcohol Use: No  . Drug Use: No  . Sexual Activity:    Partners: Male    Birth Control/ Protection: None     Comment: Stopped Depo in November; trying for pregnancy   Other Topics Concern  . Not on file   Social History Narrative    Past Surgical Hx:  Past Surgical History  Procedure Laterality Date  . No past surgeries    . Dilation and evacuation N/A 08/10/2014    Procedure: DILATATION AND EVACUATION;  Surgeon: Lavonia Drafts, MD;  Location: [facility];  Service: Gynecology;  Laterality: N/A;  . Hysteroscopy N/A 08/10/2014    Procedure: HYSTEROSCOPY;  Surgeon: Lavonia Drafts, MD;  Location: [facility];  Service: Gynecology;  Laterality: N/A;    Past Medical Hx:  Past Medical History  Diagnosis Date  . Pyelonephritis     Past Gynecological History:   SVD x 1 (06/05/14)  No LMP recorded.  Family Hx:  Family History  Problem Relation Age of Onset  . Alcohol abuse Neg Hx     Review of Systems:  Constitutional  Feels well,    ENT Normal appearing ears and nares bilaterally Skin/Breast  No rash, sores, jaundice, itching, dryness Cardiovascular  No chest pain, shortness of breath, or edema  Pulmonary  No cough or wheeze.  Gastro Intestinal  No nausea, vomitting, or diarrhoea. No bright red blood per rectum, + abdominal pain, no change in bowel movement, +constipation.  Genito Urinary  No frequency, urgency, dysuria,  Musculo Skeletal  No myalgia, arthralgia, joint swelling or pain  Neurologic  No weakness, numbness, change in gait,  Psychology  No depression, anxiety, insomnia.   Vitals:  Blood pressure 127/74, pulse 80, temperature 98.1 F (36.7 C), temperature source Oral, resp. rate 20, height 4\' 10"  (1.473 m), weight 91 lb 11.2 oz (41.595 kg), not currently breastfeeding.  Physical Exam: WD in NAD Neck  Supple NROM, without any enlargements.  Lymph Node Survey No cervical supraclavicular or inguinal  adenopathy Cardiovascular  Pulse normal rate, regularity and rhythm. S1 and S2 normal.  Lungs  Clear to auscultation bilateraly, without wheezes/crackles/rhonchi. Good air movement.  Skin  No rash/lesions/breakdown  Psychiatry  Alert and oriented to person, place, and time  Abdomen  Normoactive bowel sounds, abdomen soft, non-tender and thin without evidence of hernia.  Back No CVA tenderness Genito Urinary  Vulva/vagina: Normal external female genitalia.  No lesions. No discharge or bleeding.  Bladder/urethra:  No lesions or masses, well supported bladder  Vagina: no lesions visible  Cervix: Normal appearing, no lesions.  Uterus:  Small, mobile, no parametrial involvement or nodularity.  Adnexa: no palpable masses. Rectal  Good tone, no masses no cul de sac nodularity.  Extremities  No bilateral cyanosis,  clubbing or edema.   Donaciano Eva, MD   09/14/2014, 10:34 AM

## 2014-09-14 NOTE — Patient Instructions (Signed)
We will call you with the lab results done today and let you know if you need any further medicine or treatment.

## 2014-09-15 ENCOUNTER — Other Ambulatory Visit: Payer: Self-pay | Admitting: Oncology

## 2014-09-15 DIAGNOSIS — E349 Endocrine disorder, unspecified: Secondary | ICD-10-CM

## 2014-09-15 DIAGNOSIS — R7989 Other specified abnormal findings of blood chemistry: Secondary | ICD-10-CM

## 2014-09-15 LAB — HCG, QUANTITATIVE, PREGNANCY: hCG, Beta Chain, Quant, S: 6736 m[IU]/mL

## 2014-09-15 NOTE — Addendum Note (Signed)
Addended by: Joylene John D on: 09/15/2014 09:45 AM   Modules accepted: Orders

## 2014-09-16 ENCOUNTER — Telehealth: Payer: Self-pay | Admitting: Oncology

## 2014-09-16 ENCOUNTER — Other Ambulatory Visit: Payer: Self-pay | Admitting: Oncology

## 2014-09-16 ENCOUNTER — Telehealth: Payer: Self-pay | Admitting: *Deleted

## 2014-09-16 ENCOUNTER — Telehealth: Payer: Self-pay

## 2014-09-16 NOTE — Telephone Encounter (Signed)
Per staff message and POF I have scheduled appts. Advised scheduler of appts. JMW  

## 2014-09-16 NOTE — Telephone Encounter (Signed)
Returned pt's call. She asked for recent lab test results from 2/24.  I advised her that the level of hormone did rise.  Pt then stated that she is supposed to be called with information as to plan of care. Per chart review, pt was left a voice mail earlier today regarding this information. I advised pt of this information and asked her to listen to her voice mail when we complete our call. The message that was left on her voice mail gives the information she needs and she also needs to respond to the nurse that left the message.  Pt agreed and voiced understanding of information and instructions.

## 2014-09-16 NOTE — Telephone Encounter (Signed)
Spoke with Ms. Mccrory. She  thought her Chemo Education class was on 09-20-14( from message in VM from [facility] )  It was cancelled and R/S to 09-20-14.  She is to be at [facility] at 0800 on 09-20-14. She will see the doctor and then go to class and then have treatment. Discussed antiemetics with Ms. Guadron.  She is allergic to Zofran and phenergan . Will notify Dr. Marko Plume and  obtain an order for another antiemetic.    She then asked if she needed to begin this treatment now or could she wait a month.  She is to leave March 4th to go see her mother in [place].  She would return April 8th.   Told her that she needs to keep appointments on 09-20-14 as scheduled and discuss treatment planning with Dr. Marko Plume.  Told Ms. Montesano that this information would be given to Dr. Marko Plume to review.

## 2014-09-16 NOTE — Telephone Encounter (Signed)
LEFT PT VM IN REF TO CHEMO ED ON 09/19/14@10 :00 AND 09/20/14@8 :00 TO SEE DR. Marko Plume. ASKED TO CALL AND CONFIRM APPT.

## 2014-09-16 NOTE — Telephone Encounter (Signed)
Resa left a message which was difficult to hear / understand. She stated she was calling a nurse, about blood test, and wants a call back.

## 2014-09-16 NOTE — Telephone Encounter (Signed)
-----   Message from Gordy Levan, MD sent at 09/15/2014  5:58 PM EST ----- I will see this new patient on 09-20-14 and she will need to start weekly IM MTX same day.  Please be sure she has zofran 8 mg q 8hrs prn nausea OR compazine 10 mg q 6 hr -- Generics fine, #20 of either one. She will need this at least by 3-1. I don't know if insurance etc. Hopefully she will have chemo ed before I meet her, so maybe Alleta/ Abigail Butts can follow up on this also  Thank you

## 2014-09-16 NOTE — Telephone Encounter (Signed)
s.w pt and advised on 3.1 appt...Marland KitchenMarland Kitchen

## 2014-09-19 ENCOUNTER — Other Ambulatory Visit: Payer: Medicaid Other

## 2014-09-20 ENCOUNTER — Ambulatory Visit: Payer: Medicaid Other

## 2014-09-20 ENCOUNTER — Encounter: Payer: Self-pay | Admitting: Oncology

## 2014-09-20 ENCOUNTER — Telehealth: Payer: Self-pay | Admitting: Oncology

## 2014-09-20 ENCOUNTER — Ambulatory Visit (HOSPITAL_BASED_OUTPATIENT_CLINIC_OR_DEPARTMENT_OTHER): Payer: Medicaid Other | Admitting: Oncology

## 2014-09-20 ENCOUNTER — Other Ambulatory Visit: Payer: Self-pay | Admitting: Oncology

## 2014-09-20 ENCOUNTER — Other Ambulatory Visit (HOSPITAL_BASED_OUTPATIENT_CLINIC_OR_DEPARTMENT_OTHER): Payer: Medicaid Other

## 2014-09-20 ENCOUNTER — Ambulatory Visit (HOSPITAL_BASED_OUTPATIENT_CLINIC_OR_DEPARTMENT_OTHER): Payer: Medicaid Other

## 2014-09-20 ENCOUNTER — Other Ambulatory Visit: Payer: Medicaid Other

## 2014-09-20 VITALS — BP 125/79 | HR 69 | Temp 97.5°F | Resp 18 | Ht <= 58 in | Wt 92.2 lb

## 2014-09-20 DIAGNOSIS — E349 Endocrine disorder, unspecified: Secondary | ICD-10-CM

## 2014-09-20 DIAGNOSIS — O019 Hydatidiform mole, unspecified: Secondary | ICD-10-CM

## 2014-09-20 DIAGNOSIS — R911 Solitary pulmonary nodule: Secondary | ICD-10-CM

## 2014-09-20 DIAGNOSIS — R1032 Left lower quadrant pain: Secondary | ICD-10-CM

## 2014-09-20 LAB — COMPREHENSIVE METABOLIC PANEL (CC13)
ALT: 17 U/L (ref 0–55)
AST: 17 U/L (ref 5–34)
Albumin: 4.5 g/dL (ref 3.5–5.0)
Alkaline Phosphatase: 52 U/L (ref 40–150)
Anion Gap: 9 mEq/L (ref 3–11)
BILIRUBIN TOTAL: 0.82 mg/dL (ref 0.20–1.20)
BUN: 7.4 mg/dL (ref 7.0–26.0)
CO2: 25 mEq/L (ref 22–29)
CREATININE: 0.6 mg/dL (ref 0.6–1.1)
Calcium: 10.2 mg/dL (ref 8.4–10.4)
Chloride: 104 mEq/L (ref 98–109)
GLUCOSE: 88 mg/dL (ref 70–140)
Potassium: 4.6 mEq/L (ref 3.5–5.1)
Sodium: 139 mEq/L (ref 136–145)
Total Protein: 7.8 g/dL (ref 6.4–8.3)

## 2014-09-20 LAB — CBC WITH DIFFERENTIAL/PLATELET
BASO%: 0.3 % (ref 0.0–2.0)
Basophils Absolute: 0 10*3/uL (ref 0.0–0.1)
EOS%: 3.5 % (ref 0.0–7.0)
Eosinophils Absolute: 0.2 10*3/uL (ref 0.0–0.5)
HCT: 39.4 % (ref 34.8–46.6)
HGB: 12.7 g/dL (ref 11.6–15.9)
LYMPH%: 46.4 % (ref 14.0–49.7)
MCH: 29.9 pg (ref 25.1–34.0)
MCHC: 32.3 g/dL (ref 31.5–36.0)
MCV: 92.6 fL (ref 79.5–101.0)
MONO#: 0.5 10*3/uL (ref 0.1–0.9)
MONO%: 7 % (ref 0.0–14.0)
NEUT#: 3 10*3/uL (ref 1.5–6.5)
NEUT%: 42.8 % (ref 38.4–76.8)
PLATELETS: 189 10*3/uL (ref 145–400)
RBC: 4.26 10*6/uL (ref 3.70–5.45)
RDW: 12.4 % (ref 11.2–14.5)
WBC: 6.9 10*3/uL (ref 3.9–10.3)
lymph#: 3.2 10*3/uL (ref 0.9–3.3)

## 2014-09-20 LAB — HCG, QUANTITATIVE, PREGNANCY: hCG, Beta Chain, Quant, S: 4975 m[IU]/mL

## 2014-09-20 MED ORDER — PROCHLORPERAZINE MALEATE 10 MG PO TABS
ORAL_TABLET | ORAL | Status: AC
  Filled 2014-09-20: qty 1

## 2014-09-20 MED ORDER — PROCHLORPERAZINE MALEATE 10 MG PO TABS
10.0000 mg | ORAL_TABLET | Freq: Once | ORAL | Status: AC
  Administered 2014-09-20: 10 mg via ORAL

## 2014-09-20 MED ORDER — METHOTREXATE SODIUM CHEMO INJECTION 25 MG/ML
50.0000 mg/m2 | Freq: Once | INTRAMUSCULAR | Status: AC
  Administered 2014-09-20: 65 mg via INTRAMUSCULAR
  Filled 2014-09-20: qty 2.6

## 2014-09-20 MED ORDER — PROCHLORPERAZINE MALEATE 10 MG PO TABS: 10.0000 mg | ORAL_TABLET | Freq: Four times a day (QID) | ORAL | Status: DC | PRN

## 2014-09-20 NOTE — Patient Instructions (Signed)
Shelly Koch Discharge Instructions for Patients Receiving Chemotherapy  Today you received the following chemotherapy agents:  Methotrexate  To help prevent nausea and vomiting after your treatment, we encourage you to take your nausea medication as ordered per MD.   If you develop nausea and vomiting that is not controlled by your nausea medication, call the clinic.   BELOW ARE SYMPTOMS THAT SHOULD BE REPORTED IMMEDIATELY:  *FEVER GREATER THAN 100.5 F  *CHILLS WITH OR WITHOUT FEVER  NAUSEA AND VOMITING THAT IS NOT CONTROLLED WITH YOUR NAUSEA MEDICATION  *UNUSUAL SHORTNESS OF BREATH  *UNUSUAL BRUISING OR BLEEDING  TENDERNESS IN MOUTH AND THROAT WITH OR WITHOUT PRESENCE OF ULCERS  *URINARY PROBLEMS  *BOWEL PROBLEMS  UNUSUAL RASH Items with * indicate a potential emergency and should be followed up as soon as possible.  Feel free to call the clinic you have any questions or concerns. The clinic phone number is [phone].

## 2014-09-20 NOTE — Progress Notes (Signed)
Checked in new pt with no financial concerns.  Pt's insurance should pay at 100% but she has my card for billing questions or concerns.

## 2014-09-20 NOTE — Telephone Encounter (Signed)
, °

## 2014-09-20 NOTE — Progress Notes (Signed)
Pt observed for 20 minutes after IM Methotrexate and is without complaints.  Injection site to right buttock is unremarkable.  Band aid is clean, dry and intact.

## 2014-09-21 ENCOUNTER — Telehealth: Payer: Self-pay

## 2014-09-21 NOTE — Telephone Encounter (Signed)
-----   Message from San Morelle, RN sent at 09/20/2014  2:59 PM EST ----- Regarding: chemo f/u call Contact: [phone] First time IM Methotrexate.  Dr. Marko Plume.  Pt tolerated injection without difficulty.

## 2014-09-21 NOTE — Telephone Encounter (Signed)
Spoke with Shelly Koch and she stated that she is doing well post MTX injection yesterday.  She denies n/v.  Bowels moving well. She is eating and drinking fluids without any problem.  She has not needed any additional nausea medication. Revied appointment for visit and treatment for Tuesday 09-27-14. Patient verbalized understanding.

## 2014-09-24 NOTE — Progress Notes (Signed)
[place] PATIENT EVALUATION   Name Shelly Koch, Shelly Koch    Date of visit September 20, 2014 MRN: [id number] DOB: 12-05-1990  REFERRING PHYSICIAN: Everitt Amber cc Denita Lung, MD, Margaretmary Dys  All of information in EMR reviewed for this consultation and discussed with patient. Situation discussed directly with Dr Denman George during this visit. Patient also seen by financial counselors, Education officer, museum and had chemotherapy education by Therapist, sports. Note patient does speak and understand English, but has some difficulty with the medical terminology such that we have been careful to ensure her understanding.  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 female who is seen in consultation, alone for visit, at the request of Dr Denman George, for systemic treatment of recently diagnosed GTN.  Patient had twin pregnancy in 2015, with loss of one of the twins at [redacted] weeks gestation and delivery of full term female on 06-05-14, pathology of placenta with no hydatidiform change. 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 She was seen for lower abdominal pain and vaginal bleeding in ED 07-04-14, with quantitative hCG 389 and pelvic US showing thickened, heterogeneous endometrium with internal blood flow, suspicious for retained products of conception. She was seen by Dr Ihor Dow, with quantitative HCG 1374 on 07-26-14  and 1922 on 07-28-14. [place] 08-02-14 had 1 cm area in central uterus suspicious for retained products of conception, with no gestational sac and 3.3 cm hemorrhagic cyst on left ovary. She had D & C hysteroscopy by Dr Ihor Dow on 08-10-14, uterus 6 week size and very small amount of tissue. Pathology [id number]) was consistent with placental site reaction and retained products of conception. Quantitative hCG 08-25-14 was 4856 and repeat on 09-14-14 was 6736. CT CAP 09-09-14 had no findings of concern, with a few tiny  sub-cm pulmonary nodules on left thought likely post-inflammatory. She was seen by Dr Denman George on 09-14-14, with CT findings reassuring and methotrexate recommended due to further rise in hCG. She had Implanon placed RUE 06-2014 for contraception. TSH on 08-25-14 was 2.176. Patient attended chemotherapy education class following my visit today.   REVIEW OF SYSTEMS as above, also: Patient continues to have pain LLQ as she has had since delivery, always present but more bothersome by evening such that she takes ibuprofen ~ once daily, last yesterday afternoon. She denies any bleeding now. She is not breast feeding. Bowels are moving well now with miralax once daily. No nausea or vomiting. No fever or symptoms of infection. No SOB, minimal occasional cough not new, no other respiratory symptoms. Occasional HA which are not new or different, no other neurologic symptoms. She has felt fatigued since delivery. Good visual acuity without corrective lenses, no sinus symptoms, no dental problems, no history of thyroid disease. Bladder ok. No LE swelling Some pain left ankle etiology not known.  Remainder of full 10 point review of systems negative.   ALLERGIES: Ginger; Okra; Ondansetron; and Promethazine  PAST MEDICAL/ SURGICAL HISTORY:    States allergy to zofran and phenergan causing hives Pyelonephritis 10-2012, refused admission then  CURRENT MEDICATIONS: reviewed as listed now in EM   SOCIAL HISTORY:  From [place], in [place] x 3 years. Sister age 39 also in [place], mother in [place], family also in [place] and [place]. Never smoker, no ETOH or drugs. Lives with boyfriend, who is employed, and infant daughter. Patient plans to go to [place], [place] from March 4 thru April 15 for a sister's wedding.  She has [place] Medicaid, which our financial counselors have explained will not be active in [place]; emergency medicaid will generally only cover inpatient admissions per social worker and is very difficult to obtain  per financial counselors.   FAMILY HISTORY:  No known cancer in family. No other known medical problems in parents or siblings.          PHYSICAL EXAM:  height is 4\' 10"  (1.473 m) and weight is 92 lb 3.2 oz (41.822 kg). Her oral temperature is 97.5 F (36.4 C). Her blood pressure is 125/79 and her pulse is 69. Her respiration is 18.  Alert, pleasant, cooperative lady appears stated age, looks comfortable in exam room.  HEENT: PERRL, not icteric. Oral mucosa and posterior pharynx moist and clear, unremarkable dentition. Neck supple without thyroid mass. Normal hair pattern RESPIRATORY:lungs clear to A and P  CARDIAC/ VASCULAR:heart RRR, no murmur or gallop. Peripheral pulses intact and symmetrical ABDOMEN: soft, not tender including LLQ, no HSM or mass, normal bowel sounds, not distended.  LYMPH NODES:no cervical, supraclavicular, axillary or inguinal adenopathy BREASTS:bilaterally without dominant mass or other findings of concern  NEUROLOGIC:CN, motor, sensory, cerebellar nonfocal  SKIN:without rash, ecchymosis, petechiae  MUSCULOSKELETAL: SQ implant RUE site not remarkable. Extremities without swelling or cords. Back nontender    LABORATORY DATA:  From 09-20-14 WBC 6.9, ANC 3.0, Hgb 12.7, plt 189k CMET entirely normal including creatinine 0.6, normal LFTs Quantitative hCG drawn today and available after visit 4975   PATHOLOGY:  Patient: Shelly Koch Collected: 06/05/2014 Client: [facility] Accession: [id number] Received: 06/06/2014 Manya Silvas, CNM REPORT OF SURGICAL PATHOLOGY Diagnosis Placenta - [place] PLACENTA WITH THREE VESSEL UMBILICAL CORD AND FOCAL INFARCTION. Microscopic Comment The fetal membranes and umbilical cord show no significant inflammation. There are small, mature, well vascularized villi without evidence of vasculitis, villitis, or tumors. No hydatidiform change is identified.       [phone]) Patient: Shelly Koch  Collected: 08/10/2014 Client: [facility] Accession: [id number] Received: 08/10/2014 Lavonia Drafts, MD RT OF SURGICAL PATHOLOGY Diagnosis Endometrium, curettage, possible retained placenta - RARE DEGENERATED NECROTIC FETAL CHORIONIC VILLI IDENTIFIED WITH ASSOCIATED HYALINIZED DEBRIS CONSISTENT WITH PLACENTAL SITE REACTION AND RETAINED PRODUCTS OF CONCEPTION. - ASSOCIATED CALCIFICATIONS. - BENIGN ENDOMETRIUM WITH PROGESTATIONAL EFFECT. - BENIGN SMOOTH MUSCLE CONSISTENT WITH BENIGN MYOMETRIUM. - NO ATYPIA OR MALIGNANCY IDENTIFIED.  RADIOGRAPHY:   TRANSABDOMINAL ULTRASOUND OF PELVIS 07-04-14  TECHNIQUE: Transabdominal ultrasound examination of the pelvis was performed including evaluation of the uterus, ovaries, adnexal regions, and pelvic cul-de-sac.  COMPARISON: None.  FINDINGS: Uterus  Measurements: 7.3 x 4.6 x 5.8 cm. No fibroids or other mass visualized.  Endometrium  Thickness: Thickened at 24 mm, heterogeneous. Internal blood flow noted.  Right ovary  Measurements: 2.3 x 1.6 x 2.1 cm. Normal appearance/no adnexal mass.  Left ovary  Measurements: Not visualized. No adnexal masses seen.  Other findings: No free fluid  IMPRESSION: Thickened, heterogeneous endometrium with internal blood flow. Findings suspicious for retained products of conception.    EXAM: OBSTETRIC <14 WK [place] AND TRANSVAGINAL OB US  08-02-14  TECHNIQUE: Both transabdominal and transvaginal ultrasound examinations were performed for complete evaluation of the gestation as well as the maternal uterus, adnexal regions, and pelvic cul-de-sac. Transvaginal technique was performed to assess early pregnancy.  COMPARISON: 07/04/2014  FINDINGS: No intrauterine gestational sac identified. A focal hyperechoic area is seen within the central endometrium which measures 1.1 x 0.5 x 0.8 cm. This has decreased since prior study, but is suspicious for a persistent  small focus of retained products of conception. No myometrial masses are identified.  The right ovary is normal in appearance. A complex cystic lesion is again seen in the left ovary which contains a fine reticular pattern of internal echoes, without blood flow on color Doppler ultrasound. This measures 3.3 x 2.5 x 2.7 cm, and has findings consistent with a benign hemorrhagic cyst.  IMPRESSION: 1.1 cm irregular hyperechoic focus in the central endometrial cavity, suspicious for persistent small focus of retained products of conception. No intrauterine gestational sac visualized.  3.3 cm left ovarian hemorrhagic cyst which is new since prior exam.    EXAM: CT CHEST, ABDOMEN, AND PELVIS WITH CONTRAST  09-09-2014 TECHNIQUE:  COMPARISON: None.  FINDINGS: CT CHEST FINDINGS  Mediastinum/Lymph Nodes: No masses or pathologically enlarged lymph nodes identified.  Lungs/Pleura: No pulmonary infiltrate or mass identified. A few tiny sub-cm pulmonary nodules are seen scattered in the left upper and lower lobes, most likely postinflammatory in etiology in patient of this age. No effusion present.  Musculoskeletal/Soft Tissues: No suspicious bone lesions or other significant chest wall abnormality.  CT ABDOMEN AND PELVIS FINDINGS  Hepatobiliary: No masses or other significant abnormality identified. Gallbladder is unremarkable.  Pancreas: No mass, inflammatory changes, or other parenchymal abnormality identified.  Spleen: Within normal limits in size and appearance.  Adrenal Glands: No mass identified.  Kidneys/Urinary Tract: No masses identified. No evidence of hydronephrosis.  Stomach/Bowel/Peritoneum: No evidence of wall thickening, mass, or obstruction. No evidence of ascites.  Vascular/Lymphatic: No pathologically enlarged lymph nodes identified. 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  Reproductive: Uterus and  adnexal regions are unremarkable in appearance.  Other: None.  Musculoskeletal: No suspicious bone lesions identified.  IMPRESSION: No evidence of malignancy or other acute findings within the chest, abdomen, or pelvis.    DISCUSSION:  History as above and GTN diagnosis carefully reviewed with patient. I have explained how this problem develops, problems that could occur if not treated, fact that generally GTNs are responsive to treatment and are completely cured, and fact that this treatment should not impair future fertility (as she had been concerned about this). I have told her that it would be preferable to treat now, and discussed usual course of weekly MTX following quantitative hCG and contnuing treatment x ~ 3 weeks beyond normalization of marker. We have discussed antiemetics prn and follow up during treatment.  Dr Denman George and I have also discussed option of 5 day MTX regimen, which seems more problematic as it is not yet clear if patient will adjust her plans to be out of state for ~ 6 weeks. Dr Denman George requests lab test human placental lactogen (HPL) with next blood draw. I have strongly encouraged patient to reconsider the timing of her trip to [place], as the wedding itself is not until end of March, tho certainly this event is very important to her and to her family. At completion of my discussion with her, patient plans to discuss with boyfriend when he is able to take phone call at work.  After chemotherapy education and discussion with social worker today, patient did agree to begin treatment, first IM MTX administered 09-20-14. She prefers to be seen same day as MTX injection, and certainly we will try to accommodate this as best possible.   IMPRESSION / PLAN:  1.rising hCG 3.5 months from full term pregnancy/ loss of one twin at 10 weeks, in otherwise healthy 24 yo lady with unremarkable body CTs. Begin weekly IM MTX  today, no leucovorin. WIll follow weekly quantitative hCG,  counts and chemistries; will check human placental lactogen also next week. She has implanted progesterone contraceptive. 2.sub-cm scattered pulmonary nodules left lung not thought likely related to GTN 3.social complications, as patient has trip out of state planned into April and does not have insurance to continue care for this problem outside of [place]. Discussion as above 4.pyelonephritis 2014, resolved with oral antibiotics  5.LLQ abdominal pain since delivery, no findings that seem to correlate on most recent imaging, which was CT. 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 She can contact this office for questions or concerns at any time prior to next scheduled visit. Chemo orders confirmed. Compazine prescription. Verbal consent. Financial counselors aware.  Time spent  50 min, including >50% discussion and coordination of care.    Gordy Levan, MD

## 2014-09-25 ENCOUNTER — Emergency Department (HOSPITAL_COMMUNITY): Payer: Medicaid Other

## 2014-09-25 ENCOUNTER — Encounter (HOSPITAL_COMMUNITY): Payer: Self-pay | Admitting: *Deleted

## 2014-09-25 ENCOUNTER — Emergency Department (HOSPITAL_COMMUNITY)
Admission: EM | Admit: 2014-09-25 | Discharge: 2014-09-25 | Disposition: A | Discharge status: Home / Self Care | Payer: Medicaid Other | Attending: Emergency Medicine | Admitting: Emergency Medicine

## 2014-09-25 ENCOUNTER — Other Ambulatory Visit: Payer: Self-pay | Admitting: Oncology

## 2014-09-25 DIAGNOSIS — R1032 Left lower quadrant pain: Secondary | ICD-10-CM | POA: Diagnosis not present

## 2014-09-25 DIAGNOSIS — R110 Nausea: Secondary | ICD-10-CM | POA: Diagnosis not present

## 2014-09-25 DIAGNOSIS — R102 Pelvic and perineal pain: Secondary | ICD-10-CM

## 2014-09-25 DIAGNOSIS — Z79899 Other long term (current) drug therapy: Secondary | ICD-10-CM | POA: Insufficient documentation

## 2014-09-25 DIAGNOSIS — Z3A00 Weeks of gestation of pregnancy not specified: Secondary | ICD-10-CM | POA: Diagnosis not present

## 2014-09-25 DIAGNOSIS — O9989 Other specified diseases and conditions complicating pregnancy, childbirth and the puerperium: Secondary | ICD-10-CM | POA: Diagnosis not present

## 2014-09-25 LAB — BASIC METABOLIC PANEL
Anion gap: 9 (ref 5–15)
BUN: 6 mg/dL (ref 6–23)
CHLORIDE: 109 mmol/L (ref 96–112)
CO2: 21 mmol/L (ref 19–32)
Calcium: 9.2 mg/dL (ref 8.4–10.5)
Creatinine, Ser: 0.43 mg/dL — ABNORMAL LOW (ref 0.50–1.10)
GLUCOSE: 94 mg/dL (ref 70–99)
Potassium: 4.2 mmol/L (ref 3.5–5.1)
SODIUM: 139 mmol/L (ref 135–145)

## 2014-09-25 LAB — CBC WITH DIFFERENTIAL/PLATELET
BASOS ABS: 0 10*3/uL (ref 0.0–0.1)
Basophils Relative: 0 % (ref 0–1)
Eosinophils Absolute: 0.1 10*3/uL (ref 0.0–0.7)
Eosinophils Relative: 2 % (ref 0–5)
HCT: 35.4 % — ABNORMAL LOW (ref 36.0–46.0)
HEMOGLOBIN: 11.9 g/dL — AB (ref 12.0–15.0)
LYMPHS ABS: 2.8 10*3/uL (ref 0.7–4.0)
Lymphocytes Relative: 49 % — ABNORMAL HIGH (ref 12–46)
MCH: 31.1 pg (ref 26.0–34.0)
MCHC: 33.6 g/dL (ref 30.0–36.0)
MCV: 92.4 fL (ref 78.0–100.0)
MONOS PCT: 5 % (ref 3–12)
Monocytes Absolute: 0.3 10*3/uL (ref 0.1–1.0)
NEUTROS ABS: 2.6 10*3/uL (ref 1.7–7.7)
NEUTROS PCT: 44 % (ref 43–77)
Platelets: 168 10*3/uL (ref 150–400)
RBC: 3.83 MIL/uL — ABNORMAL LOW (ref 3.87–5.11)
RDW: 12 % (ref 11.5–15.5)
WBC: 5.9 10*3/uL (ref 4.0–10.5)

## 2014-09-25 LAB — URINALYSIS, ROUTINE W REFLEX MICROSCOPIC
BILIRUBIN URINE: NEGATIVE
Glucose, UA: NEGATIVE mg/dL
Hgb urine dipstick: NEGATIVE
Ketones, ur: NEGATIVE mg/dL
LEUKOCYTES UA: NEGATIVE
NITRITE: NEGATIVE
Protein, ur: NEGATIVE mg/dL
Specific Gravity, Urine: 1.007 (ref 1.005–1.030)
Urobilinogen, UA: 0.2 mg/dL (ref 0.0–1.0)
pH: 6 (ref 5.0–8.0)

## 2014-09-25 LAB — I-STAT BETA HCG BLOOD, ED (MC, WL, AP ONLY): I-stat hCG, quantitative: >2000 m[IU]/mL — ABNORMAL HIGH (ref <5)

## 2014-09-25 LAB — POC URINE PREG, ED: Preg Test, Ur: POSITIVE — AB

## 2014-09-25 LAB — WET PREP, GENITAL
Clue Cells Wet Prep HPF POC: NONE SEEN
TRICH WET PREP: NONE SEEN
YEAST WET PREP: NONE SEEN

## 2014-09-25 LAB — HCG, QUANTITATIVE, PREGNANCY: hCG, Beta Chain, Quant, S: 4136 m[IU]/mL — ABNORMAL HIGH (ref <5)

## 2014-09-25 MED ORDER — MORPHINE SULFATE 4 MG/ML IJ SOLN
4.0000 mg | Freq: Once | INTRAMUSCULAR | Status: AC
  Administered 2014-09-25: 4 mg via INTRAVENOUS
  Filled 2014-09-25: qty 1

## 2014-09-25 MED ORDER — METOCLOPRAMIDE HCL 5 MG/ML IJ SOLN
10.0000 mg | Freq: Once | INTRAMUSCULAR | Status: AC
  Administered 2014-09-25: 10 mg via INTRAVENOUS
  Filled 2014-09-25: qty 2

## 2014-09-25 MED ORDER — SODIUM CHLORIDE 0.9 % IV BOLUS (SEPSIS)
1000.0000 mL | Freq: Once | INTRAVENOUS | Status: AC
  Administered 2014-09-25: 1000 mL via INTRAVENOUS

## 2014-09-25 MED ORDER — METOCLOPRAMIDE HCL 10 MG PO TABS: 10.0000 mg | ORAL_TABLET | Freq: Four times a day (QID) | ORAL | Status: DC

## 2014-09-25 MED ORDER — HYDROCODONE-ACETAMINOPHEN 5-325 MG PO TABS: 2.0000 | ORAL_TABLET | ORAL | Status: DC | PRN

## 2014-09-25 NOTE — ED Notes (Signed)
pts iv removed and vital signs updated pt awaiting discharge paper work at bedside.

## 2014-09-25 NOTE — Discharge Instructions (Signed)
Take Vicodin as needed for pain. Take reglan as needed for nausea. Refer to attached documents for more information. Return to the ED with worsening or concerning symptoms. Follow up with your doctor for further evaluation.

## 2014-09-25 NOTE — ED Notes (Signed)
Pt reports LLQ pain for several days, has nausea but no vomiting, diarrhea or urinary symptoms. Last bm was this morning.

## 2014-09-25 NOTE — ED Notes (Signed)
Patient transported to Ultrasound 

## 2014-09-25 NOTE — ED Notes (Signed)
Pt remains monitored by blood pressure and pulse ox. pts family remains at bedside.  

## 2014-09-25 NOTE — ED Provider Notes (Signed)
CSN: [id number]     Arrival date & time 09/25/14  7096 History   First MD Initiated Contact with Patient 09/25/14 [phone]     Chief Complaint  Patient presents with  . Abdominal Pain     (Consider location/radiation/quality/duration/timing/severity/associated sxs/prior Treatment) HPI Comments: Patient is a 24 year old female with a past medical history of gestational trophoblastic neoplasm who presents with abdominal pain that started 3 days ago and acutely worsened yesterday. The pain is located in the LLQ and does not radiate. The pain is described as cramping and severe. The pain started gradually and progressively worsened since the onset. No alleviating/aggravating factors. The patient has tried nothing for symptoms without relief. Associated symptoms include nausea. Patient denies fever, headache, vomiting, diarrhea, chest pain, SOB, dysuria, constipation, abnormal vaginal bleeding/discharge. Patient delivered twins in November 2015 and continues to have increased HCG. Patient reports being treated with chemo at this time.     Patient is a 24 y.o. female presenting with abdominal pain.  Abdominal Pain Associated symptoms: nausea   Associated symptoms: no chest pain, no chills, no diarrhea, no dysuria, no fatigue, no fever, no shortness of breath and no vomiting     Past Medical History  Diagnosis Date  . Pyelonephritis    Past Surgical History  Procedure Laterality Date  . No past surgeries    . Dilation and evacuation N/A 08/10/2014    Procedure: DILATATION AND EVACUATION;  Surgeon: Lavonia Drafts, MD;  Location: [facility];  Service: Gynecology;  Laterality: N/A;  . Hysteroscopy N/A 08/10/2014    Procedure: HYSTEROSCOPY;  Surgeon: Lavonia Drafts, MD;  Location: [facility];  Service: Gynecology;  Laterality: N/A;   Family History  Problem Relation Age of Onset  . Alcohol abuse Neg Hx    History  Substance Use Topics  . Smoking status: Never Smoker   . Smokeless  tobacco: Never Used  . Alcohol Use: No   OB History    Gravida Para Term Preterm AB TAB SAB Ectopic Multiple Living   1 1 1       0 1     Review of Systems  Constitutional: Negative for fever, chills and fatigue.  HENT: Negative for trouble swallowing.   Eyes: Negative for visual disturbance.  Respiratory: Negative for shortness of breath.   Cardiovascular: Negative for chest pain and palpitations.  Gastrointestinal: Positive for nausea and abdominal pain. Negative for vomiting and diarrhea.  Genitourinary: Negative for dysuria and difficulty urinating.  Musculoskeletal: Negative for arthralgias and neck pain.  Skin: Negative for color change.  Neurological: Negative for dizziness and weakness.  Psychiatric/Behavioral: Negative for dysphoric mood.      Allergies  Ginger; Okra; Ondansetron; and Promethazine  Home Medications   Prior to Admission medications   Medication Sig Start Date End Date Taking? Authorizing Provider  etonogestrel (NEXPLANON) 68 MG IMPL implant 1 each by Subdermal route once.   Yes Historical Provider, MD  ibuprofen (ADVIL,MOTRIN) 600 MG tablet Take 1 tablet (600 mg total) by mouth every 6 (six) hours as needed. 08/10/14  Yes Lavonia Drafts, MD  prochlorperazine (COMPAZINE) 10 MG tablet Take 1 tablet (10 mg total) by mouth every 6 (six) hours as needed for nausea or vomiting. 09/20/14  Yes Lennis Marion Downer, MD  polyethylene glycol powder (GLYCOLAX/MIRALAX) powder Take 17 g by mouth daily. Patient not taking: Reported on 09/20/2014 08/04/14   Denita Lung, MD   BP 121/85 mmHg  Pulse 83  Temp(Src) 98.6 F (37 C) (Oral)  Resp  16  SpO2 100% Physical Exam  Constitutional: She is oriented to person, place, and time. She appears well-developed and well-nourished. No distress.  HENT:  Head: Normocephalic and atraumatic.  Eyes: Conjunctivae and EOM are normal.  Neck: Normal range of motion.  Cardiovascular: Normal rate and regular rhythm.  Exam reveals  no gallop and no friction rub.   No murmur heard. Pulmonary/Chest: Effort normal and breath sounds normal. She has no wheezes. She has no rales. She exhibits no tenderness.  Abdominal: Soft. She exhibits no distension. There is tenderness. There is no rebound.  LLQ tenderness to palpation. No other focal tenderness.   Genitourinary: Vagina normal.  Scant amount of thick, white discharge in vagina. Cervical os closed. No CMT. Left adnexal tenderness to palpation on bimanual exam.   Musculoskeletal: Normal range of motion.  Neurological: She is alert and oriented to person, place, and time. Coordination normal.  Speech is goal-oriented. Moves limbs without ataxia.   Skin: Skin is warm and dry.  Psychiatric: She has a normal mood and affect. Her behavior is normal.  Nursing note and vitals reviewed.   ED Course  Procedures (including critical care time) Labs Review Labs Reviewed  WET PREP, GENITAL - Abnormal; Notable for the following:    WBC, Wet Prep HPF POC FEW (*)    All other components within normal limits  CBC WITH DIFFERENTIAL/PLATELET - Abnormal; Notable for the following:    RBC 3.83 (*)    Hemoglobin 11.9 (*)    HCT 35.4 (*)    Lymphocytes Relative 49 (*)    All other components within normal limits  BASIC METABOLIC PANEL - Abnormal; Notable for the following:    Creatinine, Ser 0.43 (*)    All other components within normal limits  HCG, QUANTITATIVE, PREGNANCY - Abnormal; Notable for the following:    hCG, Beta Chain, Quant, S 4136 (*)    All other components within normal limits  POC URINE PREG, ED - Abnormal; Notable for the following:    Preg Test, Ur POSITIVE (*)    All other components within normal limits  I-STAT BETA HCG BLOOD, ED ([facility], [facility], [facility] ONLY) - Abnormal; Notable for the following:    I-stat hCG, quantitative >2000.0 (*)    All other components within normal limits  URINALYSIS, ROUTINE W REFLEX MICROSCOPIC  HCG, QUANTITATIVE, PREGNANCY  GC/CHLAMYDIA  PROBE AMP ([place])    Imaging Review US Transvaginal Non-ob  09/25/2014   CLINICAL DATA:  Abdominal pain. HCG secreting tumor. LEFT adnexal pain. Initial encounter.  EXAM: TRANSABDOMINAL AND TRANSVAGINAL ULTRASOUND OF PELVIS  TECHNIQUE: Both transabdominal and transvaginal ultrasound examinations of the pelvis were performed. Transabdominal technique was performed for global imaging of the pelvis including uterus, ovaries, adnexal regions, and pelvic cul-de-sac. It was necessary to proceed with endovaginal exam following the transabdominal exam to visualize the endometrium and ovaries.  COMPARISON:  09/09/2014.  FINDINGS: Uterus  Measurements: 6.6 cm x 4.0 cm x 4.7 cm. No fibroids or other mass visualized.  Endometrium  Thickness: Normal at 5 mm.  No focal abnormality visualized.  Right ovary  Measurements: 24 mm x 18 mm x 15 mm. Normal appearance/no adnexal mass.  Left ovary  Measurements: 33 mm x 15 mm x 21 mm. Normal appearance/no adnexal mass.  Other findings  Small amount of physiologic free fluid in the anatomic pelvis.  IMPRESSION: Normal pelvic ultrasound.   Electronically Signed   By: Dereck Ligas M.D.   On: 09/25/2014 11:41   US  Pelvis Complete  09/25/2014   CLINICAL DATA:  Abdominal pain. HCG secreting tumor. LEFT adnexal pain. Initial encounter.  EXAM: TRANSABDOMINAL AND TRANSVAGINAL ULTRASOUND OF PELVIS  TECHNIQUE: Both transabdominal and transvaginal ultrasound examinations of the pelvis were performed. Transabdominal technique was performed for global imaging of the pelvis including uterus, ovaries, adnexal regions, and pelvic cul-de-sac. It was necessary to proceed with endovaginal exam following the transabdominal exam to visualize the endometrium and ovaries.  COMPARISON:  09/09/2014.  FINDINGS: Uterus  Measurements: 6.6 cm x 4.0 cm x 4.7 cm. No fibroids or other mass visualized.  Endometrium  Thickness: Normal at 5 mm.  No focal abnormality visualized.  Right ovary  Measurements: 24  mm x 18 mm x 15 mm. Normal appearance/no adnexal mass.  Left ovary  Measurements: 33 mm x 15 mm x 21 mm. Normal appearance/no adnexal mass.  Other findings  Small amount of physiologic free fluid in the anatomic pelvis.  IMPRESSION: Normal pelvic ultrasound.   Electronically Signed   By: Dereck Ligas M.D.   On: 09/25/2014 11:41     EKG Interpretation None      MDM   Final diagnoses:  Left lower quadrant pain   9:42 AM Labs unremarkable for acute changes. Urine pregnancy positive. Patient will have pelvic US. Vitals stable and patient afebrile.   12:50 PM Patient's US unremarkable for acute changes. Patient's hcg trending down. Patient will be discharged with pain medication and nausea medication. Patient advised to follow up with her doctor. No further evaluation needed at this time.   Alvina Chou, PA-C 09/25/14 1254  Debby Freiberg, MD 09/28/14 2221

## 2014-09-26 LAB — GC/CHLAMYDIA PROBE AMP (~~LOC~~) NOT AT ARMC
Chlamydia: NEGATIVE
NEISSERIA GONORRHEA: NEGATIVE

## 2014-09-27 ENCOUNTER — Telehealth: Payer: Self-pay | Admitting: *Deleted

## 2014-09-27 ENCOUNTER — Ambulatory Visit (HOSPITAL_BASED_OUTPATIENT_CLINIC_OR_DEPARTMENT_OTHER): Payer: Medicaid Other

## 2014-09-27 ENCOUNTER — Other Ambulatory Visit: Payer: Self-pay | Admitting: Oncology

## 2014-09-27 ENCOUNTER — Ambulatory Visit (HOSPITAL_BASED_OUTPATIENT_CLINIC_OR_DEPARTMENT_OTHER): Payer: Medicaid Other | Admitting: Oncology

## 2014-09-27 ENCOUNTER — Other Ambulatory Visit (HOSPITAL_BASED_OUTPATIENT_CLINIC_OR_DEPARTMENT_OTHER): Payer: Medicaid Other

## 2014-09-27 ENCOUNTER — Encounter: Payer: Self-pay | Admitting: Oncology

## 2014-09-27 ENCOUNTER — Telehealth: Payer: Self-pay | Admitting: Oncology

## 2014-09-27 VITALS — BP 125/74 | HR 74 | Temp 98.4°F | Resp 20 | Ht <= 58 in | Wt 91.1 lb

## 2014-09-27 DIAGNOSIS — O019 Hydatidiform mole, unspecified: Secondary | ICD-10-CM

## 2014-09-27 DIAGNOSIS — O010 Classical hydatidiform mole: Secondary | ICD-10-CM

## 2014-09-27 DIAGNOSIS — E349 Endocrine disorder, unspecified: Secondary | ICD-10-CM

## 2014-09-27 DIAGNOSIS — R7989 Other specified abnormal findings of blood chemistry: Secondary | ICD-10-CM

## 2014-09-27 DIAGNOSIS — K5900 Constipation, unspecified: Secondary | ICD-10-CM

## 2014-09-27 LAB — CBC WITH DIFFERENTIAL/PLATELET
BASO%: 0.2 % (ref 0.0–2.0)
BASOS ABS: 0 10*3/uL (ref 0.0–0.1)
EOS%: 1.9 % (ref 0.0–7.0)
Eosinophils Absolute: 0.1 10*3/uL (ref 0.0–0.5)
HEMATOCRIT: 38.1 % (ref 34.8–46.6)
HEMOGLOBIN: 12.6 g/dL (ref 11.6–15.9)
LYMPH%: 58.4 % — ABNORMAL HIGH (ref 14.0–49.7)
MCH: 30.9 pg (ref 25.1–34.0)
MCHC: 33.1 g/dL (ref 31.5–36.0)
MCV: 93.4 fL (ref 79.5–101.0)
MONO#: 0.3 10*3/uL (ref 0.1–0.9)
MONO%: 6.3 % (ref 0.0–14.0)
NEUT#: 1.6 10*3/uL (ref 1.5–6.5)
NEUT%: 33.2 % — AB (ref 38.4–76.8)
Platelets: 174 10*3/uL (ref 145–400)
RBC: 4.08 10*6/uL (ref 3.70–5.45)
RDW: 12 % (ref 11.2–14.5)
WBC: 4.8 10*3/uL (ref 3.9–10.3)
lymph#: 2.8 10*3/uL (ref 0.9–3.3)

## 2014-09-27 LAB — COMPREHENSIVE METABOLIC PANEL (CC13)
ALBUMIN: 4.3 g/dL (ref 3.5–5.0)
ALK PHOS: 55 U/L (ref 40–150)
ALT: 12 U/L (ref 0–55)
AST: 18 U/L (ref 5–34)
Anion Gap: 9 mEq/L (ref 3–11)
BUN: 6.3 mg/dL — ABNORMAL LOW (ref 7.0–26.0)
CO2: 25 mEq/L (ref 22–29)
CREATININE: 0.7 mg/dL (ref 0.6–1.1)
Calcium: 9.3 mg/dL (ref 8.4–10.4)
Chloride: 107 mEq/L (ref 98–109)
Glucose: 105 mg/dl (ref 70–140)
POTASSIUM: 4.4 meq/L (ref 3.5–5.1)
SODIUM: 141 meq/L (ref 136–145)
Total Bilirubin: 0.5 mg/dL (ref 0.20–1.20)
Total Protein: 7.5 g/dL (ref 6.4–8.3)

## 2014-09-27 LAB — HCG, QUANTITATIVE, PREGNANCY: HCG, BETA CHAIN, QUANT, S: 3385.3 m[IU]/mL

## 2014-09-27 MED ORDER — METHOTREXATE SODIUM CHEMO INJECTION 25 MG/ML
50.0000 mg/m2 | Freq: Once | INTRAMUSCULAR | Status: AC
  Administered 2014-09-27: 65 mg via INTRAMUSCULAR
  Filled 2014-09-27: qty 2.6

## 2014-09-27 MED ORDER — ONDANSETRON 8 MG PO TBDP: ORAL_TABLET | ORAL | Status: DC

## 2014-09-27 MED ORDER — PROCHLORPERAZINE MALEATE 10 MG PO TABS
ORAL_TABLET | ORAL | Status: AC
  Filled 2014-09-27: qty 1

## 2014-09-27 MED ORDER — POLYETHYLENE GLYCOL 3350 17 GM/SCOOP PO POWD: 17.0000 g | Freq: Every day | ORAL | Status: DC

## 2014-09-27 MED ORDER — PROCHLORPERAZINE MALEATE 10 MG PO TABS
10.0000 mg | ORAL_TABLET | Freq: Once | ORAL | Status: AC
  Administered 2014-09-27: 10 mg via ORAL

## 2014-09-27 MED ORDER — LORAZEPAM 0.5 MG PO TABS: ORAL_TABLET | ORAL | Status: DC

## 2014-09-27 NOTE — Telephone Encounter (Signed)
Per staff message and POF I have scheduled appts. Advised scheduler of appts. JMW  

## 2014-09-27 NOTE — Telephone Encounter (Signed)
APPTS MADE AND AVS PRITNED FOR PT EMAIL TO ADD TX  ANNE

## 2014-09-27 NOTE — Patient Instructions (Signed)
[facility] Discharge Instructions for Patients Receiving Chemotherapy  Today you received the following chemotherapy agents methotrexate   To help prevent nausea and vomiting after your treatment, we encourage you to take your nausea medication as directed  If you develop nausea and vomiting that is not controlled by your nausea medication, call the clinic.   BELOW ARE SYMPTOMS THAT SHOULD BE REPORTED IMMEDIATELY:  *FEVER GREATER THAN 100.5 F  *CHILLS WITH OR WITHOUT FEVER  NAUSEA AND VOMITING THAT IS NOT CONTROLLED WITH YOUR NAUSEA MEDICATION  *UNUSUAL SHORTNESS OF BREATH  *UNUSUAL BRUISING OR BLEEDING  TENDERNESS IN MOUTH AND THROAT WITH OR WITHOUT PRESENCE OF ULCERS  *URINARY PROBLEMS  *BOWEL PROBLEMS  UNUSUAL RASH Items with * indicate a potential emergency and should be followed up as soon as possible.  Feel free to call the clinic you have any questions or concerns. The clinic phone number is [phone].  

## 2014-09-27 NOTE — Progress Notes (Signed)
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 She was evaluated at ED on 09-25-14 due to increased LLQ abdominal pain and nausea vomiting, including repeat pelvic US and hCG.   Patient had no nausea after first methotrexate until increased LLQ pain on 09-24-14, then nausea and vomiting such that she was unable to keep down oral compazine. In ED she had hCG 4136 and unremarkable pelvic US; she was given prescriptions for hydrocodone 5/325 and reglan (did not fill reglan). LLQ pain has again been back to baseline constant but not severe since that episode. She has had no further nausea or vomiting. She denies mucositis or bleeding. Bowels have moved daily with miralax, which she will continue for now. She is eating regular diet of rice, vegetables, some meat 3-4x daily. She is not doing any strenuous activity at home.   The baby is doing well. She has cancelled trip to [place] in order to have treatments as recommended now.    ONCOLOGIC HISTORY Patient had twin pregnancy in 2015, with loss of one of the twins at [redacted] weeks gestation and delivery of full term female on 06-05-14, pathology of placenta with no hydatidiform change. 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 She was seen for lower abdominal pain and vaginal bleeding in ED 07-04-14, with quantitative hCG 389 and pelvic US showing thickened, heterogeneous endometrium with internal blood flow, suspicious for retained products of conception. She was seen by Dr Ihor Dow, with quantitative HCG 1374 on 07-26-14 and 1922 on 07-28-14. [place] 08-02-14 had 1 cm area in central uterus suspicious for retained products of conception, with no gestational sac and 3.3 cm hemorrhagic cyst on left ovary. She had D & C hysteroscopy by Dr  Ihor Dow on 08-10-14, uterus 6 week size and very small amount of tissue. Pathology [phone]) was consistent with placental site reaction and retained products of conception. Quantitative hCG 08-25-14 was 4856 and repeat on 09-14-14 was 6736. CT CAP 09-09-14 had no findings of concern, with a few tiny sub-cm pulmonary nodules on left thought likely post-inflammatory. She was seen by Dr Denman George on 09-14-14, with CT findings reassuring and methotrexate recommended due to further rise in hCG. She had Implanon placed RUE 06-2014 for contraception. TSH on 08-25-14 was 2.176. First methotrexate was 09-20-2014.   HCG results: 07-04-14   389 07-26-2014   1374 07-28-2014   1922 08-25-14   4856 09-14-14   6736 09-20-14   4975  First MTX 09-25-14   4136 09-27-14   3385  Second MTX   Review of systems as above, also: No fever, no bladder symptoms, no respiratory symptoms. Slight HA yesterday, none now, no different than usual occasional HA. Remainder of 10 point Review of Systems negative.  Objective:  Vital signs in last 24 hours:  BP 125/74 mmHg  Pulse 74  Temp(Src) 98.4 F (36.9 C) (Oral)  Resp 20  Ht $R'4\' 10"'Ko$  (1.473 m)  Wt 91 lb 1.6 oz (41.323 kg)  BMI 19.05 kg/m2 weight down 1 lb.  Alert, oriented and appropriate. Ambulatory without difficulty, looks comfortable.  No alopecia  HEENT:PERRL, sclerae not icteric. Oral mucosa moist without lesions, posterior pharynx clear.  Neck supple. No JVD.  Lymphatics:no cervical,supraclavicular, or inguinal adenopathy Resp: clear to auscultation bilaterally and normal percussion bilaterally Cardio: regular rate and rhythm. No  gallop. GI: soft, not distended, no mass or organomegaly. Normally active bowel sounds. Tender to palpation LLQ very localized area, as previously Musculoskeletal/ Extremities: without pitting edema, cords, tenderness. Spine not tender, left pelvis and left hip not tender Neuro:  nonfocal  PSYCH appropriate mood and affect Skin without rash  including LLQ, ecchymosis, petechiae  Lab Results:  Results for orders placed or performed in visit on 09/27/14  CBC with Differential  Result Value Ref Range   WBC 4.8 3.9 - 10.3 10e3/uL   NEUT# 1.6 1.5 - 6.5 10e3/uL   HGB 12.6 11.6 - 15.9 g/dL   HCT 38.1 34.8 - 46.6 %   Platelets 174 145 - 400 10e3/uL   MCV 93.4 79.5 - 101.0 fL   MCH 30.9 25.1 - 34.0 pg   MCHC 33.1 31.5 - 36.0 g/dL   RBC 4.08 3.70 - 5.45 10e6/uL   RDW 12.0 11.2 - 14.5 %   lymph# 2.8 0.9 - 3.3 10e3/uL   MONO# 0.3 0.1 - 0.9 10e3/uL   Eosinophils Absolute 0.1 0.0 - 0.5 10e3/uL   Basophils Absolute 0.0 0.0 - 0.1 10e3/uL   NEUT% 33.2 (L) 38.4 - 76.8 %   LYMPH% 58.4 (H) 14.0 - 49.7 %   MONO% 6.3 0.0 - 14.0 %   EOS% 1.9 0.0 - 7.0 %   BASO% 0.2 0.0 - 2.0 %  Comprehensive metabolic panel (Cmet) - [facility]  Result Value Ref Range   Sodium 141 136 - 145 mEq/L   Potassium 4.4 3.5 - 5.1 mEq/L   Chloride 107 98 - 109 mEq/L   CO2 25 22 - 29 mEq/L   Glucose 105 70 - 140 mg/dl   BUN 6.3 (L) 7.0 - 26.0 mg/dL   Creatinine 0.7 0.6 - 1.1 mg/dL   Total Bilirubin 0.50 0.20 - 1.20 mg/dL   Alkaline Phosphatase 55 40 - 150 U/L   AST 18 5 - 34 U/L   ALT 12 0 - 55 U/L   Total Protein 7.5 6.4 - 8.3 g/dL   Albumin 4.3 3.5 - 5.0 g/dL   Calcium 9.3 8.4 - 10.4 mg/dL   Anion Gap 9 3 - 11 mEq/L   EGFR >90 >90 ml/min/1.73 m2    HCG available after visit 3385  DR Denman George requested testing for human placental lactogen level, which can be done with labs next week (per lab, to be ordered under "other [facility] test" under Miscellaneous   Studies/Results: TRANSABDOMINAL AND TRANSVAGINAL ULTRASOUND OF PELVIS  09-25-14  TECHNIQUE: Both transabdominal and transvaginal ultrasound examinations of the pelvis were performed. Transabdominal technique was performed for global imaging of the pelvis including uterus, ovaries, adnexal regions, and pelvic cul-de-sac. It was necessary to proceed with endovaginal exam following the transabdominal exam to  visualize the endometrium and ovaries.  COMPARISON: 09/09/2014.  FINDINGS: Uterus  Measurements: 6.6 cm x 4.0 cm x 4.7 cm. No fibroids or other mass visualized.  Endometrium  Thickness: Normal at 5 mm. No focal abnormality visualized.  Right ovary  Measurements: 24 mm x 18 mm x 15 mm. Normal appearance/no adnexal mass.  Left ovary  Measurements: 33 mm x 15 mm x 21 mm. Normal appearance/no adnexal mass.  Other findings  Small amount of physiologic free fluid in the anatomic pelvis.  IMPRESSION: Normal pelvic ultrasound.   Medications: I have reviewed the patient's current medications. She does not plan to fill reglan. She has 9-10 of the hydrocodone available and will let me know if she is running out of those.  Patient has reported intolerance to zofran. Prescription for SL ativan 0.5 mg sent, also for Miralax  DISCUSSION: I have reviewed diagnosis and course to date with boyfriend and uncle together with patient now, particularly as family was extremely concerned about "cancer-like" diagnosis and chemotherapy. Barbaraann Rondo is fluent in [place] and has taken notes to share with other family members. I have given them the hCG values thru 09-25-14 and discussed weekly treatments until ~ 3 cycles beyond normalization of marker, then following marker monthly x 1 year, with no pregnancy until at least a year beyond completion of treatment. I have told them that we have high success rate in completely curing GTN, almost always with preservation of fertility. They understand that her [place] Medicaid will not cover treatments in [place]. Patient seems in full agreement now with having cancelled her planned extended trip out of state. Patient is in agreement with seeing gyn oncology with treatments next 2 weeks due to scheduling, and will discuss concerns about the LLQ discomfort again with those providers. She is also willing to move further treatments to Mondays beginning 10-17-14 to  coordinate with clinic day for this MD. Discussed diet, as patient feels that she is underweight and is requesting change from implant birth control to [place], as she "had better appetite" on depoProvera previously. I will ask [facility] dietician to see, possibly give some supplements. I have encouraged her to increase portion sizes and to eat 4x daily, as her usual diet is not high in calories, and implant is already in place.    Assessment/Plan:  1.GTN with rising hCG 3.5 months from full term pregnancy/ loss of one twin at 10 weeks, in otherwise healthy 23 yo lady with unremarkable body CTs. Weekly IM MTX begun 09-20-14, no leucovorin. WIll follow weekly quantitative hCG, counts and chemistries; will check human placental lactogen also next week. She has implanted contraceptive. 2.sub-cm scattered pulmonary nodules left lung not thought likely related to GTN 3.LLQ abdominal pain since delivery, no findings that seem to correlate on recent CT or repeat US done 09-25-14. ~ 2 hydrocodone in past 36 hours have been helpful.  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 If drops to neutropenic range would add granix 300 day after each MTX (not discussed today).   MTX orders confirmed. All questions answered and patient is in agreement with continuing as planned. TIme spent 35 min including >50% counseling and coordination of care.   Gordy Levan, MD   09/27/2014, 2:44 PM

## 2014-09-27 NOTE — Telephone Encounter (Signed)
Prescription for Ativan called into patient's pharmacy. Called patient and let her know. Also, explained to patient that she does not need to be caring for her baby alone while taking this medicine as it can make her drowsy. Patient states that her and her boyfriend are living with his parents so that is not a problem. Explained to patient that she can dissolve the tablet under her tongue if she is too nauseated to swallow it - patient verbalized understanding and states her boyfriend will pick it up later this afternoon. Told patient to not hesitate to call us back if she has any questions or concerns.

## 2014-09-27 NOTE — Telephone Encounter (Signed)
-----   Message from Gordy Levan, MD sent at 09/27/2014 11:42 AM EST ----- Regarding: RE: Allergic to Zofran Ativan 0.5 mg  1/2 - 1 tablet SL or po every 6-8 hrs as needed for nausea. #12 Will make drowsy. She should not be taking care of infant alone if she uses this. Jenicka Coxe please explain SL to her also  thanks ----- Message -----    From: Christa See, RN    Sent: 09/27/2014  11:25 AM      To: Gordy Levan, MD Subject: Allergic to Zofran                             Pt is allergic to zofran. When I went to order it, I realized it is listed as allergy. I spoke with her in the infusion room & she confirmed it is an allergy. Would you like to try anything else? Low dose ativan?  FYI she is also allergic to promethazine.   Thanks, [employer]

## 2014-09-28 ENCOUNTER — Telehealth: Payer: Self-pay

## 2014-09-28 NOTE — Telephone Encounter (Signed)
Told Ms. Colquhoun that her Beta HCG level was down to 3385 yesterday from 4136 in ED on 09-25-14 per Dr. Marko Plume.

## 2014-09-30 ENCOUNTER — Other Ambulatory Visit: Payer: Medicaid Other

## 2014-09-30 ENCOUNTER — Ambulatory Visit: Payer: Medicaid Other | Admitting: Oncology

## 2014-10-03 ENCOUNTER — Other Ambulatory Visit: Payer: Self-pay | Admitting: Oncology

## 2014-10-04 ENCOUNTER — Ambulatory Visit: Payer: Medicaid Other | Attending: Gynecologic Oncology | Admitting: Gynecologic Oncology

## 2014-10-04 ENCOUNTER — Ambulatory Visit (HOSPITAL_BASED_OUTPATIENT_CLINIC_OR_DEPARTMENT_OTHER): Payer: Medicaid Other

## 2014-10-04 ENCOUNTER — Encounter: Payer: Self-pay | Admitting: Gynecologic Oncology

## 2014-10-04 ENCOUNTER — Other Ambulatory Visit (HOSPITAL_BASED_OUTPATIENT_CLINIC_OR_DEPARTMENT_OTHER): Payer: Medicaid Other

## 2014-10-04 ENCOUNTER — Other Ambulatory Visit: Payer: Medicaid Other

## 2014-10-04 VITALS — BP 122/76 | HR 79 | Temp 98.2°F | Resp 16 | Ht <= 58 in | Wt 92.2 lb

## 2014-10-04 DIAGNOSIS — Z5111 Encounter for antineoplastic chemotherapy: Secondary | ICD-10-CM

## 2014-10-04 DIAGNOSIS — O019 Hydatidiform mole, unspecified: Secondary | ICD-10-CM

## 2014-10-04 DIAGNOSIS — R1032 Left lower quadrant pain: Secondary | ICD-10-CM | POA: Diagnosis not present

## 2014-10-04 DIAGNOSIS — O010 Classical hydatidiform mole: Secondary | ICD-10-CM | POA: Diagnosis not present

## 2014-10-04 DIAGNOSIS — R7989 Other specified abnormal findings of blood chemistry: Secondary | ICD-10-CM | POA: Diagnosis not present

## 2014-10-04 DIAGNOSIS — E349 Endocrine disorder, unspecified: Secondary | ICD-10-CM | POA: Diagnosis not present

## 2014-10-04 LAB — CBC WITH DIFFERENTIAL/PLATELET
BASO%: 0.2 % (ref 0.0–2.0)
BASOS ABS: 0 10*3/uL (ref 0.0–0.1)
EOS ABS: 0.2 10*3/uL (ref 0.0–0.5)
EOS%: 3.3 % (ref 0.0–7.0)
HCT: 35.9 % (ref 34.8–46.6)
HEMOGLOBIN: 11.8 g/dL (ref 11.6–15.9)
LYMPH%: 58.3 % — ABNORMAL HIGH (ref 14.0–49.7)
MCH: 30.9 pg (ref 25.1–34.0)
MCHC: 32.9 g/dL (ref 31.5–36.0)
MCV: 94 fL (ref 79.5–101.0)
MONO#: 0.4 10*3/uL (ref 0.1–0.9)
MONO%: 8.1 % (ref 0.0–14.0)
NEUT%: 30.1 % — ABNORMAL LOW (ref 38.4–76.8)
NEUTROS ABS: 1.6 10*3/uL (ref 1.5–6.5)
PLATELETS: 147 10*3/uL (ref 145–400)
RBC: 3.82 10*6/uL (ref 3.70–5.45)
RDW: 12.3 % (ref 11.2–14.5)
WBC: 5.4 10*3/uL (ref 3.9–10.3)
lymph#: 3.2 10*3/uL (ref 0.9–3.3)

## 2014-10-04 LAB — COMPREHENSIVE METABOLIC PANEL (CC13)
ALBUMIN: 4.2 g/dL (ref 3.5–5.0)
ALT: 17 U/L (ref 0–55)
ANION GAP: 11 meq/L (ref 3–11)
AST: 16 U/L (ref 5–34)
Alkaline Phosphatase: 52 U/L (ref 40–150)
BUN: 6.2 mg/dL — ABNORMAL LOW (ref 7.0–26.0)
CALCIUM: 9.1 mg/dL (ref 8.4–10.4)
CO2: 23 mEq/L (ref 22–29)
Chloride: 109 mEq/L (ref 98–109)
Creatinine: 0.6 mg/dL (ref 0.6–1.1)
Glucose: 102 mg/dl (ref 70–140)
Potassium: 4.4 mEq/L (ref 3.5–5.1)
SODIUM: 143 meq/L (ref 136–145)
TOTAL PROTEIN: 7.4 g/dL (ref 6.4–8.3)
Total Bilirubin: 0.4 mg/dL (ref 0.20–1.20)

## 2014-10-04 MED ORDER — PROCHLORPERAZINE MALEATE 10 MG PO TABS
10.0000 mg | ORAL_TABLET | Freq: Once | ORAL | Status: AC
  Administered 2014-10-04: 10 mg via ORAL

## 2014-10-04 MED ORDER — METHOTREXATE SODIUM CHEMO INJECTION 25 MG/ML
50.0000 mg/m2 | Freq: Once | INTRAMUSCULAR | Status: AC
  Administered 2014-10-04: 65 mg via INTRAMUSCULAR
  Filled 2014-10-04: qty 2.6

## 2014-10-04 MED ORDER — PROCHLORPERAZINE MALEATE 10 MG PO TABS
ORAL_TABLET | ORAL | Status: AC
  Filled 2014-10-04: qty 1

## 2014-10-04 NOTE — Progress Notes (Signed)
Patient observed after administration of IM methotrexate. Patient tolerated well with no complaints. Band-aid applied, no bleeding noted at site. Patient discharged ambulatory.

## 2014-10-04 NOTE — Patient Instructions (Signed)
Proceed with methotrexate injection today.  Plan to follow up next week as scheduled.  We will contact you with the results of your lab work from today.

## 2014-10-04 NOTE — Progress Notes (Signed)
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 She was evaluated at ED on 09-25-14 due to increased LLQ abdominal pain and nausea vomiting, including repeat pelvic US and hCG.  Ultrasound was unremarkable at that time and HCG 4136.   Patient had mild nausea after her last methotrexate that was relieved with the use of antiemetic PRN.  LLQ pain remains intermittent but has not worsened since her last visit.  She denies emesis, mucositis, or bleeding. Bowels are moving daily with the use of miralax twice daily.  Voiding without difficulty.  She is tolerating a regular diet of rice, vegetables, some meat 3-4x daily. She is not doing any strenuous activity at home.  She would like to have her implanon removed and begin Depo Provera injections since she had gained weight on these injections in the past.  She reports nausea, vomiting, and LLQ abdominal pain after insertion of the implanon.  No other concerns voiced.  ONCOLOGIC HISTORY Patient had twin pregnancy in 2015, with loss of one of the twins at [redacted] weeks gestation and delivery of full term female on 06-05-14, pathology of placenta with no hydatidiform change. 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 She was seen for lower abdominal pain and vaginal bleeding in ED 07-04-14, with quantitative hCG 389 and pelvic US showing thickened, heterogeneous endometrium with internal blood flow, suspicious for retained products of conception. She was seen by Dr Ihor Dow, with quantitative HCG 1374 on 07-26-14 and 1922 on 07-28-14. [place] 08-02-14 had 1 cm area in central uterus suspicious for retained products of conception, with no gestational sac and 3.3 cm hemorrhagic cyst on left ovary. She had D & C hysteroscopy by Dr Ihor Dow  on 08-10-14, uterus 6 week size and very small amount of tissue. Pathology [phone]) was consistent with placental site reaction and retained products of conception. Quantitative hCG 08-25-14 was 4856 and repeat on 09-14-14 was 6736. CT CAP 09-09-14 had no findings of concern, with a few tiny sub-cm pulmonary nodules on left thought likely post-inflammatory. She was seen by Dr Denman George on 09-14-14, with CT findings reassuring and methotrexate recommended due to further rise in hCG. She had Implanon placed RUE 06-2014 for contraception. TSH on 08-25-14 was 2.176. First methotrexate was 09-20-2014.  HCG results: 07-04-14   389 07-26-2014   1374 07-28-2014   1922 08-25-14   4856 09-14-14   6736 09-20-14   4975  First MTX 09-25-14   4136 09-27-14   3385  Second MTX 10-04-14 Pending  Review of systems as above, also: No fever, no bladder symptoms, no respiratory symptoms. Mild nausea after last injection relieved with antiemetics.  LLQ abdominal pain remains intermittent and has not worsened. Remainder of 10 point Review of Systems negative.  Objective: Vital signs in last 24 hours:  BP 122/76 mmHg  Pulse 79  Temp(Src) 98.2 F (36.8 C) (Oral)  Resp 16  Ht $R'4\' 10"'Fj$  (1.473 m)  Wt 92 lb 3.2 oz (41.822 kg)  BMI 19.28 kg/m2 weight down 1 lb.  General: Well developed, well nourished female in no acute distress. Alert and oriented x 3.  Appropriate mood and affect.  Head/Neck: No alopecia.  Sclerae anicteric. Oropharynx clear without lesions.  Oral mucosa pink and moist without lesions.  Supple without any enlargements.  Lymph node survey:  No cervical or supraclavicular adenopathy.  Cardiovascular: Regular rate and rhythm. S1 and S2 normal.  Lungs: Clear to auscultation bilaterally. No wheezes/crackles/rhonchi noted.  Skin: No rashes or lesions present. Back: No CVA tenderness.  Abdomen: Abdomen soft, non-tender and non-obese. Active bowel sounds in all quadrants. No evidence of a fluid wave or abdominal masses. Tender  to palpation LLQ very localized area, as previously noted.   Extremities: No bilateral cyanosis, edema, or clubbing.   Lab Results:  Results for orders placed or performed in visit on 10/04/14  CBC with Differential  Result Value Ref Range   WBC 5.4 3.9 - 10.3 10e3/uL   NEUT# 1.6 1.5 - 6.5 10e3/uL   HGB 11.8 11.6 - 15.9 g/dL   HCT 35.9 34.8 - 46.6 %   Platelets 147 145 - 400 10e3/uL   MCV 94.0 79.5 - 101.0 fL   MCH 30.9 25.1 - 34.0 pg   MCHC 32.9 31.5 - 36.0 g/dL   RBC 3.82 3.70 - 5.45 10e6/uL   RDW 12.3 11.2 - 14.5 %   lymph# 3.2 0.9 - 3.3 10e3/uL   MONO# 0.4 0.1 - 0.9 10e3/uL   Eosinophils Absolute 0.2 0.0 - 0.5 10e3/uL   Basophils Absolute 0.0 0.0 - 0.1 10e3/uL   NEUT% 30.1 (L) 38.4 - 76.8 %   LYMPH% 58.3 (H) 14.0 - 49.7 %   MONO% 8.1 0.0 - 14.0 %   EOS% 3.3 0.0 - 7.0 %   BASO% 0.2 0.0 - 2.0 %  Comprehensive metabolic panel (Cmet) - [facility]  Result Value Ref Range   Sodium 143 136 - 145 mEq/L   Potassium 4.4 3.5 - 5.1 mEq/L   Chloride 109 98 - 109 mEq/L   CO2 23 22 - 29 mEq/L   Glucose 102 70 - 140 mg/dl   BUN 6.2 (L) 7.0 - 26.0 mg/dL   Creatinine 0.6 0.6 - 1.1 mg/dL   Total Bilirubin 0.40 0.20 - 1.20 mg/dL   Alkaline Phosphatase 52 40 - 150 U/L   AST 16 5 - 34 U/L   ALT 17 0 - 55 U/L   Total Protein 7.4 6.4 - 8.3 g/dL   Albumin 4.2 3.5 - 5.0 g/dL   Calcium 9.1 8.4 - 10.4 mg/dL   Anion Gap 11 3 - 11 mEq/L   EGFR >90 >90 ml/min/1.73 m2    Studies/Results: TRANSABDOMINAL AND TRANSVAGINAL ULTRASOUND OF PELVIS  09-25-14  TECHNIQUE: Both transabdominal and transvaginal ultrasound examinations of the pelvis were performed. Transabdominal technique was performed for global imaging of the pelvis including uterus, ovaries, adnexal regions, and pelvic cul-de-sac. It was necessary to proceed with endovaginal exam following the transabdominal exam to visualize the endometrium and ovaries.  COMPARISON: 09/09/2014.  FINDINGS: Uterus Measurements: 6.6 cm x 4.0 cm x 4.7  cm. No fibroids or other mass visualized. Endometrium Thickness: Normal at 5 mm. No focal abnormality visualized. Right ovary Measurements: 24 mm x 18 mm x 15 mm. Normal appearance/no adnexal mass. Left ovary Measurements: 33 mm x 15 mm x 21 mm. Normal appearance/no adnexal Mass. Other findings Small amount of physiologic free fluid in the anatomic pelvis. IMPRESSION: Normal pelvic ultrasound.   Medications: I have reviewed the patient's current medications.   DISCUSSION: I discussed her CBC findings and gave the patient the hCG values thru 09-27-14.  She is in agreement with seeing gyn oncology with treatments next week due to scheduling.  Management of her LLQ pain discussed and one-time use hot packs given to the patient along  with precautions for use.  She is to continue with her diet recommendations discussed at her last visit with Dr. Marko Plume.  Referral will be made with [facility] as previously discussed.  She is advised that implanon removal will be discussed with Dr. Denman George and a message will be sent to Dr. Ihor Dow for her recommendations and/or to arrange for removal.  Assessment/Plan: 1.GTN with rising hCG 3.5 months from full term pregnancy/ loss of one twin at 10 weeks, in otherwise healthy 24 yo lady with unremarkable body CTs. Weekly IM MTX begun 09-20-14, no leucovorin. Will follow weekly quantitative hCG, counts and chemistries.  She has implanted contraceptive. 2.sub-cm scattered pulmonary nodules left lung not thought likely related to GTN 3.LLQ abdominal pain since delivery, no findings that seem to correlate on recent CT or repeat US done 09-25-14.  Currently stable.  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 If drops to neutropenic range would add granix 300 day after each MTX (not discussed today) per Dr. Marko Plume.   MTX orders confirmed. All questions answered and  patient is in agreement with continuing as planned. TIme spent 35 min including >50% counseling and coordination of care.   CROSS, MELISSA DEAL, NP   10/04/2014, 1:59 PM

## 2014-10-04 NOTE — Patient Instructions (Signed)
[facility] Discharge Instructions for Patients Receiving Chemotherapy  Today you received the following chemotherapy agents Methotrexate  To help prevent nausea and vomiting after your treatment, we encourage you to take your nausea medication as prescribed.  If you develop nausea and vomiting that is not controlled by your nausea medication, call the clinic.   BELOW ARE SYMPTOMS THAT SHOULD BE REPORTED IMMEDIATELY:  *FEVER GREATER THAN 100.5 F  *CHILLS WITH OR WITHOUT FEVER  NAUSEA AND VOMITING THAT IS NOT CONTROLLED WITH YOUR NAUSEA MEDICATION  *UNUSUAL SHORTNESS OF BREATH  *UNUSUAL BRUISING OR BLEEDING  TENDERNESS IN MOUTH AND THROAT WITH OR WITHOUT PRESENCE OF ULCERS  *URINARY PROBLEMS  *BOWEL PROBLEMS  UNUSUAL RASH Items with * indicate a potential emergency and should be followed up as soon as possible.  Feel free to call the clinic you have any questions or concerns. The clinic phone number is [phone].

## 2014-10-05 ENCOUNTER — Telehealth: Payer: Self-pay | Admitting: Oncology

## 2014-10-05 ENCOUNTER — Other Ambulatory Visit: Payer: Self-pay | Admitting: Oncology

## 2014-10-05 DIAGNOSIS — E639 Nutritional deficiency, unspecified: Secondary | ICD-10-CM

## 2014-10-05 NOTE — Telephone Encounter (Signed)
Barb neff appt made and pt will get a new sch at 3/22 appt

## 2014-10-06 ENCOUNTER — Telehealth: Payer: Self-pay | Admitting: *Deleted

## 2014-10-06 ENCOUNTER — Encounter: Payer: Self-pay | Admitting: Oncology

## 2014-10-06 LAB — HCG, QUANTITATIVE, PREGNANCY: HCG, BETA CHAIN, QUANT, S: 2394 m[IU]/mL

## 2014-10-06 NOTE — Progress Notes (Unsigned)
Medical Oncology  Spoke with [facility] lab, as Human Placental Lactogen was sent on 09-27-14 and still pending, does not need to be done again on sample collected 10-04-14.   Godfrey Pick, MD

## 2014-10-06 NOTE — Telephone Encounter (Signed)
Called and spoke with patient and let her know that Dr. Marko Plume would like her to keep the Implanon in and not start Depo Provera shots until she talks to her about this more in detail. Patient agreeable to this.   While on the phone patient inquired about this weeks HCG level - patient given results and notified that the level was lower that it was the previous week. Patient appreciative of call.

## 2014-10-10 LAB — OTHER SOLSTAS TEST

## 2014-10-11 ENCOUNTER — Other Ambulatory Visit: Payer: Medicaid Other

## 2014-10-11 ENCOUNTER — Ambulatory Visit: Payer: Medicaid Other | Attending: Gynecologic Oncology | Admitting: Gynecologic Oncology

## 2014-10-11 ENCOUNTER — Ambulatory Visit (HOSPITAL_BASED_OUTPATIENT_CLINIC_OR_DEPARTMENT_OTHER): Payer: Medicaid Other

## 2014-10-11 ENCOUNTER — Other Ambulatory Visit: Payer: Self-pay | Admitting: *Deleted

## 2014-10-11 ENCOUNTER — Other Ambulatory Visit (HOSPITAL_BASED_OUTPATIENT_CLINIC_OR_DEPARTMENT_OTHER): Payer: Medicaid Other

## 2014-10-11 ENCOUNTER — Encounter: Payer: Self-pay | Admitting: Gynecologic Oncology

## 2014-10-11 ENCOUNTER — Other Ambulatory Visit: Payer: Self-pay | Admitting: Oncology

## 2014-10-11 VITALS — BP 121/78 | HR 71 | Temp 97.3°F | Resp 16 | Ht <= 58 in | Wt 92.0 lb

## 2014-10-11 DIAGNOSIS — O019 Hydatidiform mole, unspecified: Secondary | ICD-10-CM

## 2014-10-11 DIAGNOSIS — O010 Classical hydatidiform mole: Secondary | ICD-10-CM | POA: Insufficient documentation

## 2014-10-11 DIAGNOSIS — Z5111 Encounter for antineoplastic chemotherapy: Secondary | ICD-10-CM | POA: Diagnosis not present

## 2014-10-11 DIAGNOSIS — R7989 Other specified abnormal findings of blood chemistry: Secondary | ICD-10-CM

## 2014-10-11 DIAGNOSIS — Z79899 Other long term (current) drug therapy: Secondary | ICD-10-CM | POA: Insufficient documentation

## 2014-10-11 DIAGNOSIS — R1032 Left lower quadrant pain: Secondary | ICD-10-CM | POA: Diagnosis not present

## 2014-10-11 DIAGNOSIS — E349 Endocrine disorder, unspecified: Secondary | ICD-10-CM

## 2014-10-11 DIAGNOSIS — Z793 Long term (current) use of hormonal contraceptives: Secondary | ICD-10-CM | POA: Insufficient documentation

## 2014-10-11 DIAGNOSIS — R918 Other nonspecific abnormal finding of lung field: Secondary | ICD-10-CM | POA: Diagnosis not present

## 2014-10-11 LAB — CBC WITH DIFFERENTIAL/PLATELET
BASO%: 0.2 % (ref 0.0–2.0)
Basophils Absolute: 0 10*3/uL (ref 0.0–0.1)
EOS ABS: 0.2 10*3/uL (ref 0.0–0.5)
EOS%: 3.3 % (ref 0.0–7.0)
HEMATOCRIT: 37.7 % (ref 34.8–46.6)
HGB: 12.3 g/dL (ref 11.6–15.9)
LYMPH#: 3 10*3/uL (ref 0.9–3.3)
LYMPH%: 63.1 % — ABNORMAL HIGH (ref 14.0–49.7)
MCH: 30.7 pg (ref 25.1–34.0)
MCHC: 32.6 g/dL (ref 31.5–36.0)
MCV: 94 fL (ref 79.5–101.0)
MONO#: 0.4 10*3/uL (ref 0.1–0.9)
MONO%: 8.9 % (ref 0.0–14.0)
NEUT%: 24.5 % — ABNORMAL LOW (ref 38.4–76.8)
NEUTROS ABS: 1.2 10*3/uL — AB (ref 1.5–6.5)
Platelets: 172 10*3/uL (ref 145–400)
RBC: 4.01 10*6/uL (ref 3.70–5.45)
RDW: 12.5 % (ref 11.2–14.5)
WBC: 4.8 10*3/uL (ref 3.9–10.3)

## 2014-10-11 LAB — COMPREHENSIVE METABOLIC PANEL (CC13)
ALBUMIN: 4.5 g/dL (ref 3.5–5.0)
ALK PHOS: 52 U/L (ref 40–150)
ALT: 16 U/L (ref 0–55)
AST: 17 U/L (ref 5–34)
Anion Gap: 8 mEq/L (ref 3–11)
BUN: 6.4 mg/dL — ABNORMAL LOW (ref 7.0–26.0)
CO2: 25 mEq/L (ref 22–29)
Calcium: 9.9 mg/dL (ref 8.4–10.4)
Chloride: 107 mEq/L (ref 98–109)
Creatinine: 0.6 mg/dL (ref 0.6–1.1)
EGFR: >90 mL/min/{1.73_m2} (ref >90)
Glucose: 99 mg/dl (ref 70–140)
POTASSIUM: 4.5 meq/L (ref 3.5–5.1)
Sodium: 140 mEq/L (ref 136–145)
Total Bilirubin: 0.63 mg/dL (ref 0.20–1.20)
Total Protein: 7.9 g/dL (ref 6.4–8.3)

## 2014-10-11 LAB — HCG, QUANTITATIVE, PREGNANCY: HCG, BETA CHAIN, QUANT, S: 2378.2 m[IU]/mL

## 2014-10-11 MED ORDER — PROCHLORPERAZINE MALEATE 10 MG PO TABS
10.0000 mg | ORAL_TABLET | Freq: Once | ORAL | Status: AC
  Administered 2014-10-11: 10 mg via ORAL

## 2014-10-11 MED ORDER — PROCHLORPERAZINE MALEATE 10 MG PO TABS
ORAL_TABLET | ORAL | Status: AC
  Filled 2014-10-11: qty 1

## 2014-10-11 MED ORDER — METHOTREXATE SODIUM CHEMO INJECTION 25 MG/ML
50.0000 mg/m2 | Freq: Once | INTRAMUSCULAR | Status: AC
  Administered 2014-10-11: 65 mg via INTRAMUSCULAR
  Filled 2014-10-11: qty 2.6

## 2014-10-11 NOTE — Patient Instructions (Signed)
Please call for the development of any new symptoms or concerns.  Plan to follow up as scheduled.

## 2014-10-11 NOTE — Progress Notes (Signed)
Ok to treat today with ANC of 1.2 per Joylene John, NP

## 2014-10-11 NOTE — Progress Notes (Signed)
Office Progress Note   October 11, 2014   Physicians: Evlyn Clines, Juel Burrow, MD, Margaretmary Dys  INTERVAL HISTORY:  Patient presents alone for follow up for continuing attention to GTN, weekly methotrexate initiated on 09-20-14.  Last HCG was 2394.0 on 10/04/14.   Patient had mild nausea after her last methotrexate that was relieved with the use of compazine PRN.  LLQ pain reported as a constant ache but has not worsened since her last visit.  She does report worsening of the pain at nighttime with relief after taking hydrocodone/APAP.  Reporting improvement in abdominal pain with hot packs as well.  She denies emesis, mucositis, or bleeding. Bowels are moving daily with the use of miralax twice daily.  She can tell a difference in her abdominal pain when she becomes slightly constipated.  Voiding without difficulty.  She is tolerating a regular diet of rice, vegetables, some meat 3-4x daily. She is not doing any strenuous activity at home.  She still would like to have her implanon removed and begin Depo Provera injections since she had gained weight on these injections in the past.  She states the baby is doing well.  No other concerns voiced.  ONCOLOGIC HISTORY Patient had twin pregnancy in 2015, with loss of one of the twins at [redacted] weeks gestation and delivery of full term female on 06-05-14, pathology of placenta with no hydatidiform change. 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 She was seen for lower abdominal pain and vaginal bleeding in ED 07-04-14, with quantitative hCG 389 and pelvic US showing thickened, heterogeneous endometrium with internal blood flow, suspicious for retained products of conception. She was seen by Dr Ihor Dow, with quantitative HCG 1374 on 07-26-14 and 1922 on 07-28-14. [place] 08-02-14 had 1 cm area in central uterus suspicious for retained products of conception, with no gestational sac and 3.3 cm hemorrhagic cyst on left ovary.  She had D & C hysteroscopy by Dr Ihor Dow on 08-10-14, uterus 6 week size and very small amount of tissue. Pathology [phone]) was consistent with placental site reaction and retained products of conception. Quantitative hCG 08-25-14 was 4856 and repeat on 09-14-14 was 6736. CT CAP 09-09-14 had no findings of concern, with a few tiny sub-cm pulmonary nodules on left thought likely post-inflammatory. She was seen by Dr Denman George on 09-14-14, with CT findings reassuring and methotrexate recommended due to further rise in hCG. She had Implanon placed RUE 06-2014 for contraception. TSH on 08-25-14 was 2.176. First methotrexate was 09-20-2014.  HCG results: 07-04-14   389 07-26-2014   1374 07-28-2014   1922 08-25-14   4856 09-14-14   6736 09-20-14   4975  First MTX 09-25-14   4136 09-27-14   3385  Second MTX 10-04-14 2394.0  Review of systems as above, also: No fever, no bladder symptoms, no respiratory symptoms. Mild nausea after last injection relieved with antiemetics.  LLQ abdominal pain remains constant and has not worsened. Remainder of 10 point Review of Systems negative.  Objective: Vital signs in last 24 hours:  BP 121/78 mmHg  Pulse 71  Temp(Src) 97.3 F (36.3 C) (Oral)  Resp 16  Ht $R'4\' 10"'Ny$  (1.473 m)  Wt 92 lb (41.731 kg)  BMI 19.23 kg/m2  SpO2 100% weight down 1 lb.  General: Well developed, well nourished female in no acute distress. Alert and oriented x 3.  Appropriate mood and affect.  Head/Neck: No alopecia.  Sclerae anicteric. Oropharynx clear without lesions.  Oral mucosa pink  and moist without lesions.  Supple without any enlargements.  Lymph node survey: No cervical or supraclavicular adenopathy.  Cardiovascular: Regular rate and rhythm. S1 and S2 normal.  Lungs: Clear to auscultation bilaterally. No wheezes/crackles/rhonchi noted.  Skin: No rashes or lesions present. Back: No CVA tenderness.  Abdomen: Abdomen soft, non-tender and non-obese. Active bowel sounds in all quadrants. No  evidence of a fluid wave or abdominal masses. Tender to palpation LLQ very localized area, as previously noted.   Extremities: No bilateral cyanosis, edema, or clubbing.   Lab Results:  Results for orders placed or performed in visit on 10/11/14  CBC with Differential  Result Value Ref Range   WBC 4.8 3.9 - 10.3 10e3/uL   NEUT# 1.2 (L) 1.5 - 6.5 10e3/uL   HGB 12.3 11.6 - 15.9 g/dL   HCT 37.7 34.8 - 46.6 %   Platelets 172 145 - 400 10e3/uL   MCV 94.0 79.5 - 101.0 fL   MCH 30.7 25.1 - 34.0 pg   MCHC 32.6 31.5 - 36.0 g/dL   RBC 4.01 3.70 - 5.45 10e6/uL   RDW 12.5 11.2 - 14.5 %   lymph# 3.0 0.9 - 3.3 10e3/uL   MONO# 0.4 0.1 - 0.9 10e3/uL   Eosinophils Absolute 0.2 0.0 - 0.5 10e3/uL   Basophils Absolute 0.0 0.0 - 0.1 10e3/uL   NEUT% 24.5 (L) 38.4 - 76.8 %   LYMPH% 63.1 (H) 14.0 - 49.7 %   MONO% 8.9 0.0 - 14.0 %   EOS% 3.3 0.0 - 7.0 %   BASO% 0.2 0.0 - 2.0 %  Comprehensive metabolic panel (Cmet) - [facility]  Result Value Ref Range   Sodium 140 136 - 145 mEq/L   Potassium 4.5 3.5 - 5.1 mEq/L   Chloride 107 98 - 109 mEq/L   CO2 25 22 - 29 mEq/L   Glucose 99 70 - 140 mg/dl   BUN 6.4 (L) 7.0 - 26.0 mg/dL   Creatinine 0.6 0.6 - 1.1 mg/dL   Total Bilirubin 0.63 0.20 - 1.20 mg/dL   Alkaline Phosphatase 52 40 - 150 U/L   AST 17 5 - 34 U/L   ALT 16 0 - 55 U/L   Total Protein 7.9 6.4 - 8.3 g/dL   Albumin 4.5 3.5 - 5.0 g/dL   Calcium 9.9 8.4 - 10.4 mg/dL   Anion Gap 8 3 - 11 mEq/L   EGFR >90 >90 ml/min/1.73 m2    Studies/Results: TRANSABDOMINAL AND TRANSVAGINAL ULTRASOUND OF PELVIS  09-25-14 TECHNIQUE: Both transabdominal and transvaginal ultrasound examinations of the pelvis were performed. Transabdominal technique was performed for global imaging of the pelvis including uterus, ovaries, adnexal regions, and pelvic cul-de-sac. It was necessary to proceed with endovaginal exam following the transabdominal exam to visualize the endometrium and ovaries.  COMPARISON:  09/09/2014.  FINDINGS: Uterus Measurements: 6.6 cm x 4.0 cm x 4.7 cm. No fibroids or other mass visualized. Endometrium Thickness: Normal at 5 mm. No focal abnormality visualized. Right ovary Measurements: 24 mm x 18 mm x 15 mm. Normal appearance/no adnexal mass. Left ovary Measurements: 33 mm x 15 mm x 21 mm. Normal appearance/no adnexal Mass. Other findings Small amount of physiologic free fluid in the anatomic pelvis. IMPRESSION: Normal pelvic ultrasound.  Medications: I have reviewed the patient's current medications.   DISCUSSION: I discussed her CBC findings and gave the patient the hCG values thru 10-04-14.  Management of her LLQ pain discussed and one-time use hot packs given to the patient along with precautions for use.  She is to continue with her diet recommendations discussed at her last visit with Dr. Marko Plume and is aware of her appointment with the dietician on 10/17/14.  She is advised that implanon removal will be discussed with Dr. Marko Plume at her next visit.  Assessment/Plan: 1.GTN with rising hCG 3.5 months from full term pregnancy/ loss of one twin at 10 weeks, in otherwise healthy 24 yo lady with unremarkable body CTs. Weekly IM MTX begun 09-20-14, no leucovorin. Will follow weekly quantitative hCG, counts and chemistries.  She has implanted contraceptive. 2.sub-cm scattered pulmonary nodules left lung not thought likely related to GTN 3.LLQ abdominal pain since delivery, no findings that seem to correlate on recent CT or repeat US done 09-25-14.  Currently stable.  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  Dr. Marko Plume notified.  Proceed with MTX today and plan for patient to add granix 300 day after each MTX per Dr. Marko Plume with first injection to be scheduled per Dr. Marko Plume.   MTX orders confirmed. All questions answered and patient is in agreement with continuing as planned. TIme spent 35 min  including >50% counseling and coordination of care.   CROSS, MELISSA DEAL, NP   10/11/2014, 2:49 PM

## 2014-10-11 NOTE — Progress Notes (Signed)
Okay to treat with ANC 1.2 per Joylene John, NP.

## 2014-10-11 NOTE — Patient Instructions (Signed)
[facility] Discharge Instructions for Patients Receiving Chemotherapy  Today you received the following chemotherapy agents methotrexate   To help prevent nausea and vomiting after your treatment, we encourage you to take your nausea medication as directed  If you develop nausea and vomiting that is not controlled by your nausea medication, call the clinic.   BELOW ARE SYMPTOMS THAT SHOULD BE REPORTED IMMEDIATELY:  *FEVER GREATER THAN 100.5 F  *CHILLS WITH OR WITHOUT FEVER  NAUSEA AND VOMITING THAT IS NOT CONTROLLED WITH YOUR NAUSEA MEDICATION  *UNUSUAL SHORTNESS OF BREATH  *UNUSUAL BRUISING OR BLEEDING  TENDERNESS IN MOUTH AND THROAT WITH OR WITHOUT PRESENCE OF ULCERS  *URINARY PROBLEMS  *BOWEL PROBLEMS  UNUSUAL RASH Items with * indicate a potential emergency and should be followed up as soon as possible.  Feel free to call the clinic you have any questions or concerns. The clinic phone number is [phone].  

## 2014-10-12 ENCOUNTER — Telehealth: Payer: Self-pay | Admitting: *Deleted

## 2014-10-12 ENCOUNTER — Other Ambulatory Visit: Payer: Self-pay | Admitting: Oncology

## 2014-10-12 DIAGNOSIS — O019 Hydatidiform mole, unspecified: Secondary | ICD-10-CM

## 2014-10-12 NOTE — Telephone Encounter (Signed)
Called patient and let her know that she will need labs prior to injection appt tomorrow. Patient agreeable to 2:45pm lab appt and injection afterwards. Patient states she went ahead and took one 500mg  tylenol tablet at 12:15pm. Encouraged patient to continue pushing PO fluids today and to check her temperature every few hours. Patient agreeable to this. Told patient RN will call her back this afternoon or tomorrow morning and see if she has had another fever and if so she will need to be seen by MD tomorrow. Patient agreeable to appts noted above.

## 2014-10-12 NOTE — Telephone Encounter (Signed)
Attempted to reach patient to see if she still had a temperature. No answer. RN will try to call patient again in the morning.

## 2014-10-12 NOTE — Telephone Encounter (Signed)
-----   Message from Gordy Levan, MD sent at 10/12/2014 12:18 PM EDT ----- Regarding: RE: Fever today If more fever will need to be seen by me or Cyndee tomorrow. Please add CBC tomorrow  thanks ----- Message -----    From: Christa See, RN    Sent: 10/12/2014  11:44 AM      To: Gordy Levan, MD Subject: Fever today                                    Pt cannot come today for injection due to transportation. Inj appt made for tomorrow afternoon at 3pm. While on the phone with her she said she has a fever. She only has rectal thermometer for her baby - got her to take it axillary & temp was 100.13F. Told her to go ahead and take tylenol & push PO fluids. Pt said she was nauseated this am & threw up x1 but took compazine and feels better. Has eaten today. No other issues. Do you want her to do anything else? Didn't know if she needed to possibly be seen tomorrow? She cannot come today for any appts. Thanks, Cyril Mourning   ----- Message -----    From: Gordy Levan, MD    Sent: 10/12/2014   8:42 AM      To: Christa See, RN, Alla Feeling, RN  She needs one granix injection either 3-23 or 3-24. I sent message to Minden Clark/Raquel/Lenise to be sure ok with [place] [employer]. If ok, which I think it is, could you please find out which day she can come and set up injection. I have put the order in but no POF done. Explain to patient that this will help keep WBC up with her chemo, but she may have aches for a day or so afterwards. She can use tylenol, ibuprofen, hot shower, claritin if so and can call to let us know if so.  thanks

## 2014-10-13 ENCOUNTER — Other Ambulatory Visit (HOSPITAL_BASED_OUTPATIENT_CLINIC_OR_DEPARTMENT_OTHER): Payer: Medicaid Other

## 2014-10-13 ENCOUNTER — Ambulatory Visit (HOSPITAL_BASED_OUTPATIENT_CLINIC_OR_DEPARTMENT_OTHER): Payer: Medicaid Other

## 2014-10-13 ENCOUNTER — Encounter: Payer: Self-pay | Admitting: Nurse Practitioner

## 2014-10-13 DIAGNOSIS — Z5189 Encounter for other specified aftercare: Secondary | ICD-10-CM | POA: Diagnosis not present

## 2014-10-13 DIAGNOSIS — O019 Hydatidiform mole, unspecified: Secondary | ICD-10-CM

## 2014-10-13 LAB — CBC WITH DIFFERENTIAL/PLATELET
BASO%: 0.3 % (ref 0.0–2.0)
BASOS ABS: 0 10*3/uL (ref 0.0–0.1)
EOS ABS: 0.1 10*3/uL (ref 0.0–0.5)
EOS%: 1.6 % (ref 0.0–7.0)
HEMATOCRIT: 34.9 % (ref 34.8–46.6)
HEMOGLOBIN: 11.4 g/dL — AB (ref 11.6–15.9)
LYMPH#: 2.5 10*3/uL (ref 0.9–3.3)
LYMPH%: 45.2 % (ref 14.0–49.7)
MCH: 30 pg (ref 25.1–34.0)
MCHC: 32.6 g/dL (ref 31.5–36.0)
MCV: 92.1 fL (ref 79.5–101.0)
MONO#: 0.4 10*3/uL (ref 0.1–0.9)
MONO%: 6.8 % (ref 0.0–14.0)
NEUT%: 46.1 % (ref 38.4–76.8)
NEUTROS ABS: 2.6 10*3/uL (ref 1.5–6.5)
Platelets: 195 10*3/uL (ref 145–400)
RBC: 3.79 10*6/uL (ref 3.70–5.45)
RDW: 12.8 % (ref 11.2–14.5)
WBC: 5.6 10*3/uL (ref 3.9–10.3)

## 2014-10-13 MED ORDER — TBO-FILGRASTIM 300 MCG/0.5ML ~~LOC~~ SOSY
300.0000 ug | PREFILLED_SYRINGE | Freq: Once | SUBCUTANEOUS | Status: AC
  Administered 2014-10-13: 300 ug via SUBCUTANEOUS
  Filled 2014-10-13: qty 0.5

## 2014-10-13 NOTE — Patient Instructions (Signed)

## 2014-10-13 NOTE — Progress Notes (Signed)
Per Thayer Headings, RN in injection, patient denying any more fevers and feels fine today. She received granix 300 mcg per order and was discharged home. Dr. Marko Plume notified.

## 2014-10-16 ENCOUNTER — Other Ambulatory Visit: Payer: Self-pay | Admitting: Oncology

## 2014-10-17 ENCOUNTER — Ambulatory Visit: Payer: Medicaid Other | Admitting: Nutrition

## 2014-10-17 ENCOUNTER — Encounter: Payer: Self-pay | Admitting: Oncology

## 2014-10-17 ENCOUNTER — Ambulatory Visit (HOSPITAL_BASED_OUTPATIENT_CLINIC_OR_DEPARTMENT_OTHER): Payer: Medicaid Other

## 2014-10-17 ENCOUNTER — Other Ambulatory Visit: Payer: Self-pay | Admitting: Oncology

## 2014-10-17 ENCOUNTER — Ambulatory Visit (HOSPITAL_BASED_OUTPATIENT_CLINIC_OR_DEPARTMENT_OTHER): Payer: Medicaid Other | Admitting: Oncology

## 2014-10-17 ENCOUNTER — Other Ambulatory Visit (HOSPITAL_BASED_OUTPATIENT_CLINIC_OR_DEPARTMENT_OTHER): Payer: Medicaid Other

## 2014-10-17 VITALS — BP 122/80 | HR 74 | Temp 98.6°F | Resp 18 | Ht <= 58 in | Wt 91.7 lb

## 2014-10-17 DIAGNOSIS — R636 Underweight: Secondary | ICD-10-CM

## 2014-10-17 DIAGNOSIS — O010 Classical hydatidiform mole: Secondary | ICD-10-CM | POA: Diagnosis not present

## 2014-10-17 DIAGNOSIS — O019 Hydatidiform mole, unspecified: Secondary | ICD-10-CM

## 2014-10-17 DIAGNOSIS — J3089 Other allergic rhinitis: Secondary | ICD-10-CM

## 2014-10-17 DIAGNOSIS — D701 Agranulocytosis secondary to cancer chemotherapy: Secondary | ICD-10-CM

## 2014-10-17 DIAGNOSIS — Z5111 Encounter for antineoplastic chemotherapy: Secondary | ICD-10-CM

## 2014-10-17 DIAGNOSIS — T451X5A Adverse effect of antineoplastic and immunosuppressive drugs, initial encounter: Secondary | ICD-10-CM

## 2014-10-17 DIAGNOSIS — E349 Endocrine disorder, unspecified: Secondary | ICD-10-CM

## 2014-10-17 LAB — CBC WITH DIFFERENTIAL/PLATELET
BASO%: 0.1 % (ref 0.0–2.0)
Basophils Absolute: 0 10*3/uL (ref 0.0–0.1)
EOS%: 2.4 % (ref 0.0–7.0)
Eosinophils Absolute: 0.2 10*3/uL (ref 0.0–0.5)
HCT: 35.8 % (ref 34.8–46.6)
HGB: 11.8 g/dL (ref 11.6–15.9)
LYMPH%: 50.2 % — ABNORMAL HIGH (ref 14.0–49.7)
MCH: 30.9 pg (ref 25.1–34.0)
MCHC: 33 g/dL (ref 31.5–36.0)
MCV: 93.7 fL (ref 79.5–101.0)
MONO#: 0.8 10*3/uL (ref 0.1–0.9)
MONO%: 10.3 % (ref 0.0–14.0)
NEUT%: 37 % — ABNORMAL LOW (ref 38.4–76.8)
NEUTROS ABS: 2.7 10*3/uL (ref 1.5–6.5)
PLATELETS: 182 10*3/uL (ref 145–400)
RBC: 3.82 10*6/uL (ref 3.70–5.45)
RDW: 12.8 % (ref 11.2–14.5)
WBC: 7.4 10*3/uL (ref 3.9–10.3)
lymph#: 3.7 10*3/uL — ABNORMAL HIGH (ref 0.9–3.3)

## 2014-10-17 LAB — COMPREHENSIVE METABOLIC PANEL (CC13)
ALT: 15 U/L (ref 0–55)
AST: 16 U/L (ref 5–34)
Albumin: 4.4 g/dL (ref 3.5–5.0)
Alkaline Phosphatase: 62 U/L (ref 40–150)
Anion Gap: 9 mEq/L (ref 3–11)
BILIRUBIN TOTAL: 0.32 mg/dL (ref 0.20–1.20)
BUN: 9.2 mg/dL (ref 7.0–26.0)
CO2: 26 mEq/L (ref 22–29)
Calcium: 9.6 mg/dL (ref 8.4–10.4)
Chloride: 105 mEq/L (ref 98–109)
Creatinine: 0.7 mg/dL (ref 0.6–1.1)
GLUCOSE: 87 mg/dL (ref 70–140)
Potassium: 4.6 mEq/L (ref 3.5–5.1)
SODIUM: 140 meq/L (ref 136–145)
TOTAL PROTEIN: 7.8 g/dL (ref 6.4–8.3)

## 2014-10-17 MED ORDER — PROCHLORPERAZINE MALEATE 10 MG PO TABS
10.0000 mg | ORAL_TABLET | Freq: Once | ORAL | Status: AC
  Administered 2014-10-17: 10 mg via ORAL

## 2014-10-17 MED ORDER — METHOTREXATE SODIUM CHEMO INJECTION 25 MG/ML
50.0000 mg/m2 | Freq: Once | INTRAMUSCULAR | Status: AC
  Administered 2014-10-17: 65 mg via INTRAMUSCULAR
  Filled 2014-10-17: qty 2.6

## 2014-10-17 MED ORDER — PROCHLORPERAZINE MALEATE 10 MG PO TABS
ORAL_TABLET | ORAL | Status: AC
  Filled 2014-10-17: qty 1

## 2014-10-17 NOTE — Progress Notes (Signed)
OFFICE PROGRESS NOTE   October 17, 2014   Physicians:Rossi, Darlys Gales, Elyse Jarvis, MD, Lavonia Drafts  INTERVAL HISTORY:  Patient is seen, alone for visit, in continuing attention to GTN for which she is on treatment with weekly MTX, due cycle 5 today (this one day early as she is moving treatment day to Mondays). hCG, which had decreased for first 3 weeks, was essentially unchanged last week and is pending at time of visit today. Contraception with Implanon.  Patient received cycle 4 MTX on 10-11-14 with ANC 1.2. She reported temperature 100.2 on 3-23,  with vomiting x1 improved with compazine then. She received granix 300 mg on 3-24, no further fever by then; she denies significant aching with the granix. LLQ pain is much better with prn heating pad as suggested by NP, has needed ibuprofen only once in last 48 hours. She has slight discomfort on swallowing, which may be allergic post nasal drainage, no other frank mucositis symptoms and no diarrhea. She has had no vaginal bleeding. She denies new or different pain. Appetite is good.   No PAC  Her baby is doing well. Patient met with [facility] dietician today, very useful recommendations and she is pleased with the supplement samples to try. Patient tells me that she weighed 100 lbs some time previously, but had been down to 89 lbs when she got pregnant.   ONCOLOGIC HISTORY Patient had twin pregnancy in 2015, with loss of one of the twins at [redacted] weeks gestation and delivery of full term female on 06-05-14, pathology of placenta with no hydatidiform change. 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 She was seen for lower abdominal pain and vaginal bleeding in ED 07-04-14, with quantitative hCG 389 and pelvic US showing thickened, heterogeneous endometrium with internal blood flow, suspicious for retained products of conception. She was seen by Dr Ihor Dow, with quantitative HCG 1374 on 07-26-14 and 1922 on 07-28-14. [place] 08-02-14 had  1 cm area in central uterus suspicious for retained products of conception, with no gestational sac and 3.3 cm hemorrhagic cyst on left ovary. She had D & C hysteroscopy by Dr Ihor Dow on 08-10-14, uterus 6 week size and very small amount of tissue. Pathology [id number]) was consistent with placental site reaction and retained products of conception. Quantitative hCG 08-25-14 was 4856 and repeat on 09-14-14 was 6736. CT CAP 09-09-14 had no findings of concern, with a few tiny sub-cm pulmonary nodules on left thought likely post-inflammatory. She was seen by Dr Denman George on 09-14-14, with CT findings reassuring and methotrexate recommended due to further rise in hCG. She had Implanon placed RUE 06-2014 for contraception. TSH on 08-25-14 was 2.176. First methotrexate was 09-20-2014.   Review of systems as above, also: Some clear rhinorrhea. No cough or lower respiratory symptoms. Remainder of 10 point Review of Systems negative.  Objective:  Vital signs in last 24 hours:  BP 122/80 mmHg  Pulse 74  Temp(Src) 98.6 F (37 C) (Oral)  Resp 18  Ht $R'4\' 10"'Lb$  (1.473 m)  Wt 91 lb 11.2 oz (41.595 kg)  BMI 19.17 kg/m2 Weight up ~ 0.5 lbs. Alert, oriented and appropriate. Ambulatory without difficulty, looks comfortable, very pleasant No alopecia  HEENT:PERRL, sclerae not icteric. Oral mucosa moist without lesions, posterior pharynx minimal dull erythema consistent with post nasal drainage. Slight clear rhinorrhea. Neck supple. No JVD.  Lymphatics:no cervical,supraclavicular, axillary or inguinal adenopathy Resp: clear to auscultation bilaterally and normal percussion bilaterally Cardio: regular rate and rhythm. No gallop. GI: soft,  minimally tender LLQ which is improved, not distended, no mass or organomegaly. Normally active bowel sounds. Musculoskeletal/ Extremities: without pitting edema, cords, tenderness Neuro: no peripheral neuropathy. Otherwise nonfocal Skin without rash, ecchymosis,  petechiae   Lab Results:  Results for orders placed or performed in visit on 10/17/14  CBC with Differential  Result Value Ref Range   WBC 7.4 3.9 - 10.3 10e3/uL   NEUT# 2.7 1.5 - 6.5 10e3/uL   HGB 11.8 11.6 - 15.9 g/dL   HCT 35.8 34.8 - 46.6 %   Platelets 182 145 - 400 10e3/uL   MCV 93.7 79.5 - 101.0 fL   MCH 30.9 25.1 - 34.0 pg   MCHC 33.0 31.5 - 36.0 g/dL   RBC 3.82 3.70 - 5.45 10e6/uL   RDW 12.8 11.2 - 14.5 %   lymph# 3.7 (H) 0.9 - 3.3 10e3/uL   MONO# 0.8 0.1 - 0.9 10e3/uL   Eosinophils Absolute 0.2 0.0 - 0.5 10e3/uL   Basophils Absolute 0.0 0.0 - 0.1 10e3/uL   NEUT% 37.0 (L) 38.4 - 76.8 %   LYMPH% 50.2 (H) 14.0 - 49.7 %   MONO% 10.3 0.0 - 14.0 %   EOS% 2.4 0.0 - 7.0 %   BASO% 0.1 0.0 - 2.0 %  Comprehensive metabolic panel (Cmet) - [facility]  Result Value Ref Range   Sodium 140 136 - 145 mEq/L   Potassium 4.6 3.5 - 5.1 mEq/L   Chloride 105 98 - 109 mEq/L   CO2 26 22 - 29 mEq/L   Glucose 87 70 - 140 mg/dl   BUN 9.2 7.0 - 26.0 mg/dL   Creatinine 0.7 0.6 - 1.1 mg/dL   Total Bilirubin 0.32 0.20 - 1.20 mg/dL   Alkaline Phosphatase 62 40 - 150 U/L   AST 16 5 - 34 U/L   ALT 15 0 - 55 U/L   Total Protein 7.8 6.4 - 8.3 g/dL   Albumin 4.4 3.5 - 5.0 g/dL   Calcium 9.6 8.4 - 10.4 mg/dL   Anion Gap 9 3 - 11 mEq/L   EGFR >90 >90 ml/min/1.73 m2    HCG available after visit down to 1928.8, which is drawn day 6 this week.  Studies/Results: HCG results: 07-04-14 389 07-26-2014 1374 07-28-2014 1922 08-25-14 4856 09-14-14 6736 09-20-14 4975 First MTX 09-25-14 4136 09-27-14 3385 Second MTX 10-04-14 2394. Third MTX 10-11-14  2378  Fourth MTX 10-17-14  1928.8   Fifth MTX  Medications: I have reviewed the patient's current medications.  DISCUSSION:  Dr Denman George aware of hCG level essentially stable last week: if not dropping by 10% q 2 weeks would need restaging scans and change to Actinomycin D. I will let her know of improvement in marker today.  Patient again requesting  removal of Implanon;she would prefer to be on depoProvera, which she feels allowed her to have some menstrual bleeding (which she hopes would clear the GTN more effectively) and increased her appetite. She has scheduled appointment with Dr Ihor Dow on 11-10-14 to discuss Implanon vs depoProvera. Patient had used depoProvera as contraception for ~ a year prior to pregnancy. She understands that she should not become pregnant for a full year after completing treatment for the GTN, and is in agreement with this. I have told her that the Implanon is already in place and functioning, so that it would be wasted if removed. I have encouraged her to increase po intake and calories to achieve some weight gain, as discussed by dietician today. Certainly she can discuss with  Dr Ihor Dow also.   Assessment/Plan: 1.GTN with rising hCG 3.5 months from full term pregnancy/ loss of one twin at 10 weeks, in otherwise healthy 24 yo lady with unremarkable body CTs. Weekly IM MTX begun 09-20-14, no leucovorin. Following weekly quantitative hCG, counts and chemistries, hCG dropped adequately this week (checked one day early) so will continue MTX for now. She has implanted contraceptive (see above). I will see patient with labs and treatments on 4-4 and 4-11. 2.sub-cm scattered pulmonary nodules left lung not thought likely related to GTN 3.LLQ abdominal pain since delivery, no findings that seem to correlate on recent CT or repeat US done 09-25-14. 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 7.underweight due to inadequate caloric intake: appreciate assistance from dietician, follow  Patient understood discussion well and has been given copy of hCG reports thru last week. We will let her know hCG level from this visit. She  agrees to MTX today as planned and knows that she can call prior to next scheduled visit if needed. Time spent 30 min including >50% counseling and coordination of care. Cc this note to Drs Denman George and Harraway-Smith.    Jasani Dolney P, MD   10/17/2014, 8:10 PM

## 2014-10-17 NOTE — Patient Instructions (Signed)
[facility] Discharge Instructions for Patients Receiving Chemotherapy  Today you received the following chemotherapy agents Methotrexate   To help prevent nausea and vomiting after your treatment, we encourage you to take your nausea medication as directed.    If you develop nausea and vomiting that is not controlled by your nausea medication, call the clinic.   BELOW ARE SYMPTOMS THAT SHOULD BE REPORTED IMMEDIATELY:  *FEVER GREATER THAN 100.5 F  *CHILLS WITH OR WITHOUT FEVER  NAUSEA AND VOMITING THAT IS NOT CONTROLLED WITH YOUR NAUSEA MEDICATION  *UNUSUAL SHORTNESS OF BREATH  *UNUSUAL BRUISING OR BLEEDING  TENDERNESS IN MOUTH AND THROAT WITH OR WITHOUT PRESENCE OF ULCERS  *URINARY PROBLEMS  *BOWEL PROBLEMS  UNUSUAL RASH Items with * indicate a potential emergency and should be followed up as soon as possible.  Feel free to call the clinic you have any questions or concerns. The clinic phone number is [phone].  Please show the CHEMO ALERT CARD at check-in to the Emergency Department and triage nurse.   

## 2014-10-17 NOTE — Progress Notes (Signed)
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  She is a patient of Dr. Marko Plume.  Past medical history includes pyelonephritis.  Patient is status post delivery of baby on 06/05/2014.  Medications include MiraLAX, and Compazine.  Labs include BUN of 6.4.  Height: 4 feet 10 inches. Weight: 92 pounds. Usual body weight prior to pregnancy: 89 pounds. BMI: 19.23.  Patient reports she does not breast feed her baby. She is eating rice, vegetables, meat and she drinks skim milk or juice. Patient desires to gain weight. Patient denies nutrition impact symptoms.  Nutrition diagnosis: Food and nutrition related knowledge deficit related to underweight as evidenced by BMI of 19.23.  Intervention:  Patient educated to increase calories and protein and consume meals and snacks 6 times daily. Recommended patient begin oral nutrition supplements.  I provided samples of ensure, Carnation breakfast and ensure clear.  Coupons also provided. Recommended oral nutrition supplements twice a day. Questions answered.  Teach back method used.  Monitoring, evaluation, goals: Patient will work to increase calories and protein throughout the day to promote weight gain.  Next visit: Patient will contact me with questions or concerns.  **Disclaimer: This note was dictated with voice recognition software. Similar sounding words can inadvertently be transcribed and this note may contain transcription errors which may not have been corrected upon publication of note.**

## 2014-10-17 NOTE — Progress Notes (Signed)
Pt states her ride is here and she wants her Chemo injection now, without waiting 30 minutes after compazine.

## 2014-10-18 ENCOUNTER — Telehealth: Payer: Self-pay | Admitting: *Deleted

## 2014-10-18 ENCOUNTER — Other Ambulatory Visit: Payer: Self-pay | Admitting: *Deleted

## 2014-10-18 DIAGNOSIS — R636 Underweight: Secondary | ICD-10-CM | POA: Insufficient documentation

## 2014-10-18 DIAGNOSIS — T451X5A Adverse effect of antineoplastic and immunosuppressive drugs, initial encounter: Secondary | ICD-10-CM

## 2014-10-18 DIAGNOSIS — D701 Agranulocytosis secondary to cancer chemotherapy: Secondary | ICD-10-CM | POA: Insufficient documentation

## 2014-10-18 DIAGNOSIS — J302 Other seasonal allergic rhinitis: Secondary | ICD-10-CM

## 2014-10-18 DIAGNOSIS — J3089 Other allergic rhinitis: Secondary | ICD-10-CM | POA: Insufficient documentation

## 2014-10-18 LAB — HCG, QUANTITATIVE, PREGNANCY: HCG, BETA CHAIN, QUANT, S: 1928.8 m[IU]/mL

## 2014-10-18 MED ORDER — LORATADINE 10 MG PO TABS: 10.0000 mg | ORAL_TABLET | Freq: Every day | ORAL | Status: DC | PRN

## 2014-10-18 NOTE — Telephone Encounter (Signed)
-----   Message from Gordy Levan, MD sent at 10/18/2014  7:57 AM EDT ----- Please send script for Claritin 10 mg #30 one daily as needed for allergy symptoms to her pharmacy, as Medicaid may cover if done as script.

## 2014-10-18 NOTE — Telephone Encounter (Signed)
-----   Message from Gordy Levan, MD sent at 10/18/2014  7:45 AM EDT ----- Labs seen and need follow up: please let her know hCG was down more yesterday to 1928, so the same chemo (Methotrexate injections) is still helping and we do NOT need to change to a different chemo. She had treatment 3-28, so please ask how she is doing with nausea especially.  Note she speaks and understands English fairly well, but you need to speak clearly and be sure she understands.  thanks

## 2014-10-18 NOTE — Telephone Encounter (Signed)
Notified pt that script for claritin sent to her pharmacy per Dr Marko Plume.

## 2014-10-18 NOTE — Telephone Encounter (Signed)
Called pt per Dr. Mariana Kaufman message & informed that HCG down more & methotrexate injections will be cont for now.  Questions answered & pt reports that she will talk with Dr Marko Plume at next visit.

## 2014-10-19 ENCOUNTER — Telehealth: Payer: Self-pay

## 2014-10-19 NOTE — Telephone Encounter (Signed)
-----   Message from Gordy Levan, MD sent at 10/18/2014  7:45 AM EDT ----- Labs seen and need follow up: please let her know hCG was down more yesterday to 1928, so the same chemo (Methotrexate injections) is still helping and we do NOT need to change to a different chemo. She had treatment 3-28, so please ask how she is doing with nausea especially.  Note she speaks and understands English fairly well, but you need to speak clearly and be sure she understands.  thanks

## 2014-10-19 NOTE — Telephone Encounter (Signed)
Pt. Notified of these results 10-18-14 by Jesse Fall RN.

## 2014-10-22 ENCOUNTER — Other Ambulatory Visit: Payer: Self-pay | Admitting: Oncology

## 2014-10-24 ENCOUNTER — Ambulatory Visit (HOSPITAL_BASED_OUTPATIENT_CLINIC_OR_DEPARTMENT_OTHER): Payer: Medicaid Other

## 2014-10-24 ENCOUNTER — Ambulatory Visit (HOSPITAL_BASED_OUTPATIENT_CLINIC_OR_DEPARTMENT_OTHER): Payer: Medicaid Other | Admitting: Oncology

## 2014-10-24 ENCOUNTER — Telehealth: Payer: Self-pay | Admitting: Oncology

## 2014-10-24 ENCOUNTER — Other Ambulatory Visit (HOSPITAL_BASED_OUTPATIENT_CLINIC_OR_DEPARTMENT_OTHER): Payer: Medicaid Other

## 2014-10-24 ENCOUNTER — Encounter: Payer: Self-pay | Admitting: Oncology

## 2014-10-24 VITALS — BP 116/83 | HR 79 | Temp 98.0°F | Resp 18 | Ht <= 58 in | Wt 91.6 lb

## 2014-10-24 DIAGNOSIS — E349 Endocrine disorder, unspecified: Secondary | ICD-10-CM | POA: Diagnosis not present

## 2014-10-24 DIAGNOSIS — O019 Hydatidiform mole, unspecified: Secondary | ICD-10-CM

## 2014-10-24 DIAGNOSIS — O010 Classical hydatidiform mole: Secondary | ICD-10-CM

## 2014-10-24 DIAGNOSIS — R636 Underweight: Secondary | ICD-10-CM | POA: Diagnosis not present

## 2014-10-24 DIAGNOSIS — Z5111 Encounter for antineoplastic chemotherapy: Secondary | ICD-10-CM | POA: Diagnosis not present

## 2014-10-24 LAB — COMPREHENSIVE METABOLIC PANEL (CC13)
ALBUMIN: 4.4 g/dL (ref 3.5–5.0)
ALT: 21 U/L (ref 0–55)
AST: 21 U/L (ref 5–34)
Alkaline Phosphatase: 56 U/L (ref 40–150)
Anion Gap: 11 mEq/L (ref 3–11)
BUN: 8.2 mg/dL (ref 7.0–26.0)
CHLORIDE: 105 meq/L (ref 98–109)
CO2: 25 meq/L (ref 22–29)
CREATININE: 0.7 mg/dL (ref 0.6–1.1)
Calcium: 9.6 mg/dL (ref 8.4–10.4)
GLUCOSE: 103 mg/dL (ref 70–140)
Potassium: 4.1 mEq/L (ref 3.5–5.1)
Sodium: 141 mEq/L (ref 136–145)
Total Bilirubin: 0.59 mg/dL (ref 0.20–1.20)
Total Protein: 7.9 g/dL (ref 6.4–8.3)

## 2014-10-24 LAB — CBC WITH DIFFERENTIAL/PLATELET
BASO%: 0.2 % (ref 0.0–2.0)
Basophils Absolute: 0 10*3/uL (ref 0.0–0.1)
EOS ABS: 0.2 10*3/uL (ref 0.0–0.5)
EOS%: 2.4 % (ref 0.0–7.0)
HCT: 36.6 % (ref 34.8–46.6)
HGB: 12.4 g/dL (ref 11.6–15.9)
LYMPH%: 54.8 % — ABNORMAL HIGH (ref 14.0–49.7)
MCH: 31.4 pg (ref 25.1–34.0)
MCHC: 33.9 g/dL (ref 31.5–36.0)
MCV: 92.7 fL (ref 79.5–101.0)
MONO#: 0.5 10*3/uL (ref 0.1–0.9)
MONO%: 7.6 % (ref 0.0–14.0)
NEUT#: 2.2 10*3/uL (ref 1.5–6.5)
NEUT%: 35 % — ABNORMAL LOW (ref 38.4–76.8)
PLATELETS: 177 10*3/uL (ref 145–400)
RBC: 3.95 10*6/uL (ref 3.70–5.45)
RDW: 12.9 % (ref 11.2–14.5)
WBC: 6.3 10*3/uL (ref 3.9–10.3)
lymph#: 3.5 10*3/uL — ABNORMAL HIGH (ref 0.9–3.3)

## 2014-10-24 LAB — HCG, QUANTITATIVE, PREGNANCY: HCG, BETA CHAIN, QUANT, S: 2287.9 m[IU]/mL

## 2014-10-24 MED ORDER — PROCHLORPERAZINE MALEATE 10 MG PO TABS
10.0000 mg | ORAL_TABLET | Freq: Once | ORAL | Status: AC
  Administered 2014-10-24: 10 mg via ORAL

## 2014-10-24 MED ORDER — METHOTREXATE SODIUM CHEMO INJECTION 25 MG/ML
50.0000 mg/m2 | Freq: Once | INTRAMUSCULAR | Status: AC
  Administered 2014-10-24: 65 mg via INTRAMUSCULAR
  Filled 2014-10-24: qty 2.6

## 2014-10-24 MED ORDER — PROCHLORPERAZINE MALEATE 10 MG PO TABS
ORAL_TABLET | ORAL | Status: AC
  Filled 2014-10-24: qty 1

## 2014-10-24 NOTE — Progress Notes (Signed)
OFFICE PROGRESS NOTE   October 24, 2014   Physicians:Rossi, Burnetta Sabin, MD, Lavonia Drafts  INTERVAL HISTORY:  Patient is seen, alone for visit, in continuing attention to GTN under treatment with weekly MTX since 09-20-14, due #6 today. She is tolerating the MTX generally well, with last hCG decreasing.   Patient is feeling well today. LLQ abdominal pain is minimal now, has not needed heat pack or NSAID in several days. Bowels are moving regularly without diarrhea. Nausea is controlled with regular compazine for ~ 2 days after each MTX. She has no mucositis, no particular fatigue, no bleeding, no fever or symptoms of infection.    No PAC  Sister is getting married in [place]; patient is going there 4-14 thru 11-08-14, so will change treatment date from 4-18 to 4-20.   ONCOLOGIC HISTORY Patient had twin pregnancy in 2015, with loss of one of the twins at [redacted] weeks gestation and delivery of full term female on 06-05-14, pathology of placenta with no hydatidiform change. 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 She was seen for lower abdominal pain and vaginal bleeding in ED 07-04-14, with quantitative hCG 389 and pelvic US showing thickened, heterogeneous endometrium with internal blood flow, suspicious for retained products of conception. She was seen by Dr Ihor Dow, with quantitative HCG 1374 on 07-26-14 and 1922 on 07-28-14. [place] 08-02-14 had 1 cm area in central uterus suspicious for retained products of conception, with no gestational sac and 3.3 cm hemorrhagic cyst on left ovary. She had D & C hysteroscopy by Dr Ihor Dow on 08-10-14, uterus 6 week size and very small amount of tissue. Pathology [phone]) was consistent with placental site reaction and retained products of conception. Quantitative hCG 08-25-14 was 4856 and repeat on 09-14-14 was 6736. CT CAP 09-09-14 had no findings of concern, with a few tiny sub-cm pulmonary nodules on left thought likely  post-inflammatory. She was seen by Dr Denman George on 09-14-14, with CT findings reassuring and methotrexate recommended due to further rise in hCG. She had Implanon placed RUE 06-2014 for contraception. TSH on 08-25-14 was 2.176. First methotrexate was 09-20-2014.     Review of systems as above, also: She is eating 4-5x daily and drinking supplement ~ 1 daily. No SOB or other respiratory symptoms. Bladder ok. Remainder of 10 point Review of Systems negative.  Objective:  Vital signs in last 24 hours:  BP 116/83 mmHg  Pulse 79  Temp(Src) 98 F (36.7 C) (Oral)  Resp 18  Ht 4\' 10"  (1.473 m)  Wt 91 lb 9.6 oz (41.549 kg)  BMI 19.15 kg/m2  SpO2 100% Weight stable Alert, oriented and appropriate. Ambulatory without difficulty.  No alopecia  HEENT:PERRL, sclerae not icteric. Oral mucosa moist without lesions, posterior pharynx clear.  Neck supple. No JVD.  Lymphatics:no cervical,supraclavicular,  or inguinal adenopathy Resp: clear to auscultation bilaterally and normal percussion bilaterally Cardio: regular rate and rhythm. No gallop. GI: soft, minimally tender in very localized area LLQ, nothing palpable there, not distended, no mass or organomegaly. Normally active bowel sounds.  Musculoskeletal/ Extremities: without pitting edema, cords, tenderness Neuro: nonfocal Skin without rash, ecchymosis, petechiae   Lab Results:  Results for orders placed or performed in visit on 10/24/14  CBC with Differential  Result Value Ref Range   WBC 6.3 3.9 - 10.3 10e3/uL   NEUT# 2.2 1.5 - 6.5 10e3/uL   HGB 12.4 11.6 - 15.9 g/dL   HCT 36.6 34.8 - 46.6 %   Platelets 177 145 -  400 10e3/uL   MCV 92.7 79.5 - 101.0 fL   MCH 31.4 25.1 - 34.0 pg   MCHC 33.9 31.5 - 36.0 g/dL   RBC 3.95 3.70 - 5.45 10e6/uL   RDW 12.9 11.2 - 14.5 %   lymph# 3.5 (H) 0.9 - 3.3 10e3/uL   MONO# 0.5 0.1 - 0.9 10e3/uL   Eosinophils Absolute 0.2 0.0 - 0.5 10e3/uL   Basophils Absolute 0.0 0.0 - 0.1 10e3/uL   NEUT% 35.0 (L) 38.4 -  76.8 %   LYMPH% 54.8 (H) 14.0 - 49.7 %   MONO% 7.6 0.0 - 14.0 %   EOS% 2.4 0.0 - 7.0 %   BASO% 0.2 0.0 - 2.0 %   CMET available after visit entirely normal, including creatinine 0.7, total protein 7.9 and alb 4.4  Studies/Results: HCG results: 07-04-14 389 07-26-2014 1374 07-28-2014 1922 08-25-14 4856 09-14-14 6736 09-20-14 4975 First MTX 09-25-14 4136 09-27-14 3385 Second MTX 10-04-14 2394. Third MTX 10-11-14 2378 Fourth MTX 10-17-14 1928.8 Fifth MTX  Available after visit today, hCG higher at 2287   Medications: I have reviewed the patient's current medications. She has compazine available. She did receive #6 MTX today  DISCUSSION: At time of visit, patient appears to be clinically doing well. Discussed continuing treatment until 3 cycles beyond normalization of marker. Discussed adjusting dates of treatment week of 4-18 due to sister's wedding out of state. Clinically doing well, however marker resulted after visit again higher. If does not drop by evaluation next week will need to repeat scans and change to Actinomycin D.  WIll let her know marker result from this week. I believe that patient still plans to see Dr Ihor Dow 11-10-14, as she would prefer depo Provera to present Implanon. She understands that she must use reliable contraception from now until a full year beyond completion of GTN treatment, as we follow hCG.  Assessment/Plan: 1.GTN with rising hCG 3.5 months from full term pregnancy/ loss of one twin at 10 weeks, in otherwise healthy 24 yo lady, #6 weekly MTX given today. HCG last week again lower, but resulted up after visit today. Plan as above. Adjust treatment week of 4-18 as she will be attending sister's wedding. Continue reliable contraceptive until full year after completion of GTN treatment. 2.sub-cm scattered pulmonary nodules left lung not thought likely related to GTN, tho will need to repeat scans if marker not down next week. 3.LLQ abdominal  pain since delivery, no findings that seem to correlate on recent CT or repeat US done 09-25-14. Improving. 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 Patient understands and is in agreement with recommendations and plans. Time spent 25 min including >50% counseling and coordination of care.    LIVESAY,LENNIS P, MD   10/24/2014, 2:47 PM

## 2014-10-24 NOTE — Telephone Encounter (Signed)
Appointments made and patient will get a new avs in chemo °

## 2014-10-24 NOTE — Patient Instructions (Signed)
[facility] Discharge Instructions for Patients Receiving Chemotherapy  Today you received the following chemotherapy agents Methotrexate   To help prevent nausea and vomiting after your treatment, we encourage you to take your nausea medication as directed.    If you develop nausea and vomiting that is not controlled by your nausea medication, call the clinic.   BELOW ARE SYMPTOMS THAT SHOULD BE REPORTED IMMEDIATELY:  *FEVER GREATER THAN 100.5 F  *CHILLS WITH OR WITHOUT FEVER  NAUSEA AND VOMITING THAT IS NOT CONTROLLED WITH YOUR NAUSEA MEDICATION  *UNUSUAL SHORTNESS OF BREATH  *UNUSUAL BRUISING OR BLEEDING  TENDERNESS IN MOUTH AND THROAT WITH OR WITHOUT PRESENCE OF ULCERS  *URINARY PROBLEMS  *BOWEL PROBLEMS  UNUSUAL RASH Items with * indicate a potential emergency and should be followed up as soon as possible.  Feel free to call the clinic you have any questions or concerns. The clinic phone number is [phone].  Please show the CHEMO ALERT CARD at check-in to the Emergency Department and triage nurse.   

## 2014-10-26 ENCOUNTER — Telehealth: Payer: Self-pay | Admitting: *Deleted

## 2014-10-26 ENCOUNTER — Telehealth: Payer: Self-pay

## 2014-10-26 NOTE — Telephone Encounter (Signed)
10-26-14 Patient returned call. Spoke with patient and gave her results of hCG and Dr. Mariana Kaufman comment on results and still ok to go to sister's wedding as planned.  She appreciated the information.

## 2014-10-26 NOTE — Telephone Encounter (Signed)
Documentation completed on 10/24/14 telephone note.

## 2014-10-26 NOTE — Telephone Encounter (Signed)
-----   Message from Gordy Levan, MD sent at 10/26/2014 12:17 PM EDT ----- Please let her know hCG marker was a little higher this week, at 2287. If it does not get better next week, we may need to repeat the CT scans and change the type of chemo.  Still ok to go to sister's wedding as planned.  thanks

## 2014-10-26 NOTE — Telephone Encounter (Signed)
Left message in VM to call the [facility] to discuss the results of the results of the hCG level.

## 2014-10-31 ENCOUNTER — Other Ambulatory Visit: Payer: Self-pay | Admitting: Oncology

## 2014-10-31 ENCOUNTER — Ambulatory Visit (HOSPITAL_BASED_OUTPATIENT_CLINIC_OR_DEPARTMENT_OTHER): Payer: Medicaid Other | Admitting: Oncology

## 2014-10-31 ENCOUNTER — Encounter: Payer: Self-pay | Admitting: Oncology

## 2014-10-31 ENCOUNTER — Other Ambulatory Visit (HOSPITAL_BASED_OUTPATIENT_CLINIC_OR_DEPARTMENT_OTHER): Payer: Medicaid Other

## 2014-10-31 ENCOUNTER — Ambulatory Visit (HOSPITAL_BASED_OUTPATIENT_CLINIC_OR_DEPARTMENT_OTHER): Payer: Medicaid Other

## 2014-10-31 ENCOUNTER — Telehealth: Payer: Self-pay | Admitting: Oncology

## 2014-10-31 VITALS — BP 114/83 | HR 76 | Temp 98.1°F | Resp 18 | Ht <= 58 in | Wt 92.7 lb

## 2014-10-31 DIAGNOSIS — Z5111 Encounter for antineoplastic chemotherapy: Secondary | ICD-10-CM | POA: Diagnosis not present

## 2014-10-31 DIAGNOSIS — E349 Endocrine disorder, unspecified: Secondary | ICD-10-CM | POA: Diagnosis not present

## 2014-10-31 DIAGNOSIS — O010 Classical hydatidiform mole: Secondary | ICD-10-CM

## 2014-10-31 DIAGNOSIS — K5900 Constipation, unspecified: Secondary | ICD-10-CM

## 2014-10-31 DIAGNOSIS — O019 Hydatidiform mole, unspecified: Secondary | ICD-10-CM

## 2014-10-31 LAB — CBC WITH DIFFERENTIAL/PLATELET
BASO%: 0.4 % (ref 0.0–2.0)
BASOS ABS: 0 10*3/uL (ref 0.0–0.1)
EOS%: 2.1 % (ref 0.0–7.0)
Eosinophils Absolute: 0.1 10*3/uL (ref 0.0–0.5)
HCT: 35.8 % (ref 34.8–46.6)
HGB: 11.5 g/dL — ABNORMAL LOW (ref 11.6–15.9)
LYMPH%: 54.9 % — ABNORMAL HIGH (ref 14.0–49.7)
MCH: 30.3 pg (ref 25.1–34.0)
MCHC: 32.1 g/dL (ref 31.5–36.0)
MCV: 94.5 fL (ref 79.5–101.0)
MONO#: 0.5 10*3/uL (ref 0.1–0.9)
MONO%: 9.1 % (ref 0.0–14.0)
NEUT#: 1.7 10*3/uL (ref 1.5–6.5)
NEUT%: 33.5 % — AB (ref 38.4–76.8)
Platelets: 195 10*3/uL (ref 145–400)
RBC: 3.79 10*6/uL (ref 3.70–5.45)
RDW: 13.2 % (ref 11.2–14.5)
WBC: 5.2 10*3/uL (ref 3.9–10.3)
lymph#: 2.9 10*3/uL (ref 0.9–3.3)

## 2014-10-31 LAB — COMPREHENSIVE METABOLIC PANEL (CC13)
ALBUMIN: 4.5 g/dL (ref 3.5–5.0)
ALK PHOS: 51 U/L (ref 40–150)
ALT: 16 U/L (ref 0–55)
AST: 17 U/L (ref 5–34)
Anion Gap: 8 mEq/L (ref 3–11)
BUN: 10.3 mg/dL (ref 7.0–26.0)
CHLORIDE: 106 meq/L (ref 98–109)
CO2: 26 meq/L (ref 22–29)
CREATININE: 0.7 mg/dL (ref 0.6–1.1)
Calcium: 9.6 mg/dL (ref 8.4–10.4)
EGFR: >90 mL/min/{1.73_m2} (ref >90)
Glucose: 101 mg/dl (ref 70–140)
Potassium: 4.2 mEq/L (ref 3.5–5.1)
Sodium: 140 mEq/L (ref 136–145)
TOTAL PROTEIN: 7.7 g/dL (ref 6.4–8.3)
Total Bilirubin: 0.62 mg/dL (ref 0.20–1.20)

## 2014-10-31 LAB — HCG, QUANTITATIVE, PREGNANCY: hCG, Beta Chain, Quant, S: 2004.7 m[IU]/mL

## 2014-10-31 MED ORDER — PROCHLORPERAZINE MALEATE 10 MG PO TABS: 10.0000 mg | ORAL_TABLET | Freq: Four times a day (QID) | ORAL | Status: DC | PRN

## 2014-10-31 MED ORDER — POLYETHYLENE GLYCOL 3350 17 GM/SCOOP PO POWD: 17.0000 g | Freq: Every day | ORAL | Status: DC

## 2014-10-31 MED ORDER — PROCHLORPERAZINE MALEATE 10 MG PO TABS
10.0000 mg | ORAL_TABLET | Freq: Once | ORAL | Status: AC
  Administered 2014-10-31: 10 mg via ORAL

## 2014-10-31 MED ORDER — SENNA-DOCUSATE SODIUM 8.6-50 MG PO TABS: 1.0000 | ORAL_TABLET | Freq: Two times a day (BID) | ORAL | Status: DC

## 2014-10-31 MED ORDER — PROCHLORPERAZINE MALEATE 10 MG PO TABS
ORAL_TABLET | ORAL | Status: AC
  Filled 2014-10-31: qty 1

## 2014-10-31 MED ORDER — METHOTREXATE SODIUM CHEMO INJECTION 25 MG/ML
50.0000 mg/m2 | Freq: Once | INTRAMUSCULAR | Status: AC
  Administered 2014-10-31: 65 mg via INTRAMUSCULAR
  Filled 2014-10-31: qty 2.6

## 2014-10-31 NOTE — Telephone Encounter (Signed)
per pof ot sch pt appt-gave pt copy of sch °

## 2014-10-31 NOTE — Progress Notes (Signed)
OFFICE PROGRESS NOTE   October 31, 2014   Physicians:Rossi, Darlys Gales, Elyse Jarvis, MD, Lavonia Drafts  INTERVAL HISTORY:  Patient is seen, alone for visit, in continuing attention to methotrexate being used for GTN, due #7 today. HCG level had initially improved, but had increased slightly last week and is pending again today. Last imaging was CT CAP 09-09-14. She has Nexplanon implant.  Patient has been more fatigued, which she feels is similar to when she was pregnant. She has HA this AM, no other neurologic symptoms and has taken nothing for this, last HA after MTX last week resolved with tylenol x1. She has minimal discomfort now LLQ abdomen, does not need to use heat packs or take ibuprofen. Stools have been hard with some rectal bleeding on evacuation, this despite miralax bid and will add senokot S bid. She uses compazine regularly for ~ 2 days after each MTX, no vomiting. She does not tolerate ativan. She has had no vaginal bleeding. She denies mucositis symptoms.  No PAC  She is going to sister's wedding in [place] 4-14 thru 11-08-14.   ONCOLOGIC HISTORY Patient had twin pregnancy in 2015, with loss of one of the twins at [redacted] weeks gestation and delivery of full term female on 06-05-14, pathology of placenta with no hydatidiform change. 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 She was seen for lower abdominal pain and vaginal bleeding in ED 07-04-14, with quantitative hCG 389 and pelvic US showing thickened, heterogeneous endometrium with internal blood flow, suspicious for retained products of conception. She was seen by Dr Ihor Dow, with quantitative HCG 1374 on 07-26-14 and 1922 on 07-28-14. [place] 08-02-14 had 1 cm area in central uterus suspicious for retained products of conception, with no gestational sac and 3.3 cm hemorrhagic cyst on left ovary. She had D & C hysteroscopy by Dr Ihor Dow on 08-10-14, uterus 6 week size and very small amount of tissue. Pathology  [phone]) was consistent with placental site reaction and retained products of conception. Quantitative hCG 08-25-14 was 4856 and repeat on 09-14-14 was 6736. CT CAP 09-09-14 had no findings of concern, with a few tiny sub-cm pulmonary nodules on left thought likely post-inflammatory. She was seen by Dr Denman George on 09-14-14, with CT findings reassuring and methotrexate recommended due to further rise in hCG. She had Implanon placed RUE 06-2014 for contraception. TSH on 08-25-14 was 2.176. First methotrexate was 09-20-2014.    Review of systems as above, also: No SOB or cough, some allergic nasal congestion. No new or different pain. Eating and dirinking fluids well, including some supplements. Remainder of 10 point Review of Systems negative.  Objective:  Vital signs in last 24 hours:  BP 114/83 mmHg  Pulse 76  Temp(Src) 98.1 F (36.7 C) (Oral)  Resp 18  Ht $R'4\' 10"'Xh$  (1.473 m)  Wt 92 lb 11.2 oz (42.048 kg)  BMI 19.38 kg/m2 Weight is up 1 lb. Alert, oriented and appropriate. Ambulatory without difficulty.  No alopecia  HEENT:PERRL, sclerae not icteric. Oral mucosa moist without lesions, posterior pharynx clear. Neck supple. No JVD.  Lymphatics:no cervical,supraclavicular or inguinal adenopathy Resp: clear to auscultation bilaterally and normal percussion bilaterally Cardio: regular rate and rhythm. No gallop. GI: soft, minimally tender LLQ,  not distended, no mass or organomegaly. Normally active bowel sounds. Musculoskeletal/ Extremities: without pitting edema, cords, tenderness Neuro: no peripheral neuropathy. Otherwise nonfocal Skin without rash, ecchymosis, petechiae   Lab Results:  Results for orders placed or performed in visit on 10/31/14  CBC with  Differential  Result Value Ref Range   WBC 5.2 3.9 - 10.3 10e3/uL   NEUT# 1.7 1.5 - 6.5 10e3/uL   HGB 11.5 (L) 11.6 - 15.9 g/dL   HCT 35.8 34.8 - 46.6 %   Platelets 195 145 - 400 10e3/uL   MCV 94.5 79.5 - 101.0 fL   MCH 30.3 25.1 -  34.0 pg   MCHC 32.1 31.5 - 36.0 g/dL   RBC 3.79 3.70 - 5.45 10e6/uL   RDW 13.2 11.2 - 14.5 %   lymph# 2.9 0.9 - 3.3 10e3/uL   MONO# 0.5 0.1 - 0.9 10e3/uL   Eosinophils Absolute 0.1 0.0 - 0.5 10e3/uL   Basophils Absolute 0.0 0.0 - 0.1 10e3/uL   NEUT% 33.5 (L) 38.4 - 76.8 %   LYMPH% 54.9 (H) 14.0 - 49.7 %   MONO% 9.1 0.0 - 14.0 %   EOS% 2.1 0.0 - 7.0 %   BASO% 0.4 0.0 - 2.0 %  Comprehensive metabolic panel (Cmet) - [facility]  Result Value Ref Range   Sodium 140 136 - 145 mEq/L   Potassium 4.2 3.5 - 5.1 mEq/L   Chloride 106 98 - 109 mEq/L   CO2 26 22 - 29 mEq/L   Glucose 101 70 - 140 mg/dl   BUN 10.3 7.0 - 26.0 mg/dL   Creatinine 0.7 0.6 - 1.1 mg/dL   Total Bilirubin 0.62 0.20 - 1.20 mg/dL   Alkaline Phosphatase 51 40 - 150 U/L   AST 17 5 - 34 U/L   ALT 16 0 - 55 U/L   Total Protein 7.7 6.4 - 8.3 g/dL   Albumin 4.5 3.5 - 5.0 g/dL   Calcium 9.6 8.4 - 10.4 mg/dL   Anion Gap 8 3 - 11 mEq/L   EGFR >90 >90 ml/min/1.73 m2     Studies/Results:  HCG results: 07-04-14 389 07-26-2014 1374 07-28-2014 1922 08-25-14 4856 09-14-14 6736 09-20-14 4975 First MTX 09-25-14 4136 09-27-14 3385 Second MTX 10-04-14 2394. Third MTX 10-11-14 2378 Fourth MTX 10-17-14 1928.8 Fifth MTX 10-24-2014  2287  Sixth MTX    Medications: I have reviewed the patient's current medications. Refill compazine and miralax (prescriptions due to Medicaid) and add senokot S 1-2 bid.  DISCUSSION: I have explained that hCG increased slightly last week; if no significant improvement in today's value she will need restaging scans and likely change regimen to Actinomycin D. She has been given written information on Actinomycin D, but has not had full teaching for this yet. She will be away for family event 4-14 thru 7-19. We will let her know results of hCG from today and I will update gyn oncology. She wants to proceed with IM MTX today as planned, which seems best as she will be out of town as above. I have  explained that the chemotherapy can cause fatigue. I do not expect she could be pregnant now with the contraceptive implant.  Assessment/Plan:   1.GTN with rising hCG 3.5 months from full term pregnancy/ loss of one twin at 10 weeks, in otherwise healthy 24 yo lady, #7 weekly MTX given today. HCG up slightly last week; will need repeat scans and change in regimen if not improved significantly today.  Continue reliable contraceptive until full year after completion of GTN treatment. 2.sub-cm scattered pulmonary nodules left lung not thought likely related to GTN at time of last imaging, tho will need to repeat scans if marker not down  3.LLQ abdominal pain since delivery, no findings that seem to correlate on recent  CT or repeat US done 09-25-14. Improved. 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  All questions answered. Patient understands and is in agreement with recommendations and plans. MTX orders confirmed. Time spent 25 min including >50% counseling and coordination of care.   ADDENDUM hCG resulted after visit at 2004, not significant decrease. Will repeat scans CAP + head CT with HA and let gyn oncology know.   Suezette Lafave P, MD   10/31/2014, 7:25 PM

## 2014-10-31 NOTE — Patient Instructions (Signed)
[facility] Discharge Instructions for Patients Receiving Chemotherapy  Today you received the following chemotherapy agents Methotrexate To help prevent nausea and vomiting after your treatment, we encourage you to take your nausea medication as prescribed. If you develop nausea and vomiting that is not controlled by your nausea medication, call the clinic.   BELOW ARE SYMPTOMS THAT SHOULD BE REPORTED IMMEDIATELY:  *FEVER GREATER THAN 100.5 F  *CHILLS WITH OR WITHOUT FEVER  NAUSEA AND VOMITING THAT IS NOT CONTROLLED WITH YOUR NAUSEA MEDICATION  *UNUSUAL SHORTNESS OF BREATH  *UNUSUAL BRUISING OR BLEEDING  TENDERNESS IN MOUTH AND THROAT WITH OR WITHOUT PRESENCE OF ULCERS  *URINARY PROBLEMS  *BOWEL PROBLEMS  UNUSUAL RASH Items with * indicate a potential emergency and should be followed up as soon as possible.  Feel free to call the clinic you have any questions or concerns. The clinic phone number is [phone].  Please show the CHEMO ALERT CARD at check-in to the Emergency Department and triage nurse.   

## 2014-11-01 ENCOUNTER — Other Ambulatory Visit: Payer: Self-pay | Admitting: Oncology

## 2014-11-01 ENCOUNTER — Ambulatory Visit: Payer: Medicaid Other | Admitting: Medical

## 2014-11-01 ENCOUNTER — Telehealth: Payer: Self-pay

## 2014-11-01 DIAGNOSIS — O019 Hydatidiform mole, unspecified: Secondary | ICD-10-CM

## 2014-11-01 NOTE — Telephone Encounter (Signed)
-----   Message from Gordy Levan, MD sent at 11/01/2014  7:20 AM EDT ----- Please let her know the hCG marker did not get significantly lower yesterday.  Also tell her: I have let Dr Denman George know.  I have ordered scans, which I would like to get done before she goes to [place] if possible, otherwise as soon as she comes back. We will let Drina know next recommendations.  thanks Lennis

## 2014-11-01 NOTE — Telephone Encounter (Signed)
Reviewed the information and plans for CT Scans as noted below by Dr. Marko Plume.  Shelly Koch is leaving for her sister's wedding Thursday 11-03-14 and will return 11-08-14.

## 2014-11-04 ENCOUNTER — Other Ambulatory Visit: Payer: Self-pay | Admitting: Oncology

## 2014-11-04 DIAGNOSIS — O019 Hydatidiform mole, unspecified: Secondary | ICD-10-CM

## 2014-11-07 ENCOUNTER — Encounter: Payer: Self-pay | Admitting: Pharmacist

## 2014-11-07 ENCOUNTER — Other Ambulatory Visit: Payer: Self-pay | Admitting: Oncology

## 2014-11-07 ENCOUNTER — Ambulatory Visit: Payer: Medicaid Other | Admitting: Oncology

## 2014-11-07 ENCOUNTER — Other Ambulatory Visit: Payer: Medicaid Other

## 2014-11-07 ENCOUNTER — Ambulatory Visit: Payer: Medicaid Other

## 2014-11-09 ENCOUNTER — Other Ambulatory Visit: Payer: Self-pay | Admitting: Oncology

## 2014-11-09 ENCOUNTER — Ambulatory Visit (HOSPITAL_COMMUNITY)
Admission: RE | Admit: 2014-11-09 | Discharge: 2014-11-09 | Disposition: A | Discharge status: Home / Self Care | Payer: Medicaid Other | Source: Ambulatory Visit | Attending: Oncology | Admitting: Oncology

## 2014-11-09 ENCOUNTER — Other Ambulatory Visit: Payer: Medicaid Other

## 2014-11-09 ENCOUNTER — Encounter: Payer: Self-pay | Admitting: Medical

## 2014-11-09 ENCOUNTER — Ambulatory Visit: Payer: Medicaid Other

## 2014-11-09 DIAGNOSIS — O019 Hydatidiform mole, unspecified: Secondary | ICD-10-CM

## 2014-11-09 DIAGNOSIS — R5383 Other fatigue: Secondary | ICD-10-CM | POA: Diagnosis not present

## 2014-11-09 DIAGNOSIS — O010 Classical hydatidiform mole: Secondary | ICD-10-CM | POA: Diagnosis not present

## 2014-11-09 DIAGNOSIS — R1032 Left lower quadrant pain: Secondary | ICD-10-CM | POA: Diagnosis not present

## 2014-11-09 MED ORDER — IOHEXOL 300 MG/ML  SOLN
80.0000 mL | Freq: Once | INTRAMUSCULAR | Status: AC | PRN
  Administered 2014-11-09: 80 mL via INTRAVENOUS

## 2014-11-10 ENCOUNTER — Encounter: Payer: Self-pay | Admitting: Obstetrics & Gynecology

## 2014-11-10 ENCOUNTER — Ambulatory Visit (INDEPENDENT_AMBULATORY_CARE_PROVIDER_SITE_OTHER): Payer: Medicaid Other | Admitting: Obstetrics & Gynecology

## 2014-11-10 VITALS — BP 120/80 | HR 73 | Temp 98.1°F | Wt 90.6 lb

## 2014-11-10 DIAGNOSIS — Z3009 Encounter for other general counseling and advice on contraception: Secondary | ICD-10-CM

## 2014-11-10 DIAGNOSIS — Z3042 Encounter for surveillance of injectable contraceptive: Secondary | ICD-10-CM

## 2014-11-10 MED ORDER — MEDROXYPROGESTERONE ACETATE 150 MG/ML IM SUSP
150.0000 mg | INTRAMUSCULAR | Status: DC
  Administered 2014-11-10 – 2015-01-25 (×2): 150 mg via INTRAMUSCULAR

## 2014-11-10 NOTE — Progress Notes (Signed)
Patient ID: Shelly Koch, female   DOB: 11/07/1990, 24 y.o.   MRN: [id number] Patient given informed consent, she signed consent form. She reports that since her Nexplanon was placed she has had poor weight gain, constipation, dizziness and weakness.  I have explained to her that this is unlikely to be due to the Nexplanon but, pt is adamant about having the Nexplanon removed. She reports that she was on Depo Provera for 2 years with no adverse sx and would like to be on that.  Both she and her husband report that they understand the need for contraception while she is undergoing treatment for GTN.  She wants it removed.  Appropriate time out taken.  Patient's left arm was prepped and draped in the usual sterile fashion. Patient was prepped with alcohol swab and then injected with 3 ml of 1 % lidocaine.  She was prepped with betadine.  A #11 blade was used to make a small incision over the Nexplanon rod.  Using curved forceps, the end of the rod was grasped and the scar tissue was removed and the device was removed intact.  There was minimal blood loss. The incision site was covered with guaze and a pressure bandage to reduce any bruising.  The patient tolerated the procedure well and was given post procedure instructions.

## 2014-11-10 NOTE — Patient Instructions (Signed)

## 2014-11-11 ENCOUNTER — Ambulatory Visit (HOSPITAL_BASED_OUTPATIENT_CLINIC_OR_DEPARTMENT_OTHER): Payer: Medicaid Other | Admitting: Oncology

## 2014-11-11 ENCOUNTER — Other Ambulatory Visit (HOSPITAL_BASED_OUTPATIENT_CLINIC_OR_DEPARTMENT_OTHER): Payer: Medicaid Other

## 2014-11-11 ENCOUNTER — Encounter: Payer: Self-pay | Admitting: Oncology

## 2014-11-11 VITALS — BP 121/77 | HR 80 | Temp 97.5°F | Resp 18 | Ht <= 58 in | Wt 91.6 lb

## 2014-11-11 DIAGNOSIS — O010 Classical hydatidiform mole: Secondary | ICD-10-CM

## 2014-11-11 DIAGNOSIS — O019 Hydatidiform mole, unspecified: Secondary | ICD-10-CM

## 2014-11-11 DIAGNOSIS — Z79899 Other long term (current) drug therapy: Secondary | ICD-10-CM | POA: Diagnosis not present

## 2014-11-11 DIAGNOSIS — R636 Underweight: Secondary | ICD-10-CM

## 2014-11-11 LAB — CBC WITH DIFFERENTIAL/PLATELET
BASO%: 0.4 % (ref 0.0–2.0)
Basophils Absolute: 0 10*3/uL (ref 0.0–0.1)
EOS ABS: 0.3 10*3/uL (ref 0.0–0.5)
EOS%: 4.7 % (ref 0.0–7.0)
HCT: 35.3 % (ref 34.8–46.6)
HEMOGLOBIN: 11.5 g/dL — AB (ref 11.6–15.9)
LYMPH#: 3 10*3/uL (ref 0.9–3.3)
LYMPH%: 49.3 % (ref 14.0–49.7)
MCH: 30.7 pg (ref 25.1–34.0)
MCHC: 32.7 g/dL (ref 31.5–36.0)
MCV: 93.7 fL (ref 79.5–101.0)
MONO#: 0.6 10*3/uL (ref 0.1–0.9)
MONO%: 8.9 % (ref 0.0–14.0)
NEUT#: 2.3 10*3/uL (ref 1.5–6.5)
NEUT%: 36.7 % — AB (ref 38.4–76.8)
PLATELETS: 215 10*3/uL (ref 145–400)
RBC: 3.77 10*6/uL (ref 3.70–5.45)
RDW: 14 % (ref 11.2–14.5)
WBC: 6.2 10*3/uL (ref 3.9–10.3)

## 2014-11-11 LAB — COMPREHENSIVE METABOLIC PANEL (CC13)
ALT: 14 U/L (ref 0–55)
ANION GAP: 12 meq/L — AB (ref 3–11)
AST: 18 U/L (ref 5–34)
Albumin: 4.4 g/dL (ref 3.5–5.0)
Alkaline Phosphatase: 52 U/L (ref 40–150)
BUN: 8.1 mg/dL (ref 7.0–26.0)
CHLORIDE: 107 meq/L (ref 98–109)
CO2: 23 meq/L (ref 22–29)
Calcium: 9.2 mg/dL (ref 8.4–10.4)
Creatinine: 0.6 mg/dL (ref 0.6–1.1)
GLUCOSE: 87 mg/dL (ref 70–140)
Potassium: 4.6 mEq/L (ref 3.5–5.1)
Sodium: 142 mEq/L (ref 136–145)
Total Bilirubin: 0.74 mg/dL (ref 0.20–1.20)
Total Protein: 7.7 g/dL (ref 6.4–8.3)

## 2014-11-11 NOTE — Progress Notes (Signed)
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 Unfortunately HCG has not continued to improve with weekly methotrexate, that given x 7 cycles from 09-20-2014 thru 10-31-14 (see below). Restaging CTs head, chest, abdomen, pelvis done 11-10-14 do not show metastatic disease. Plan is to change therapy to pulsed Actinomycin D. She needs PICC for Actinomycin D.  Patient was out of state from 4-14 thru 4-15 for her sister's wedding. She saw Dr Ihor Dow on 4-21, with Nexplanon removed and first depo Provera 150 mg given on 11-10-14. Patient has felt generally well. She has almost no discomfort LLQ abdomen now, no nausea, less fatigue. She denies HA now and no problems at site of implant removal.   ONCOLOGIC HISTORY Patient had twin pregnancy in 2015, with loss of one of the twins at [redacted] weeks gestation and delivery of full term female on 06-05-14, pathology of placenta with no hydatidiform change. 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 She was seen for lower abdominal pain and vaginal bleeding in ED 07-04-14, with quantitative hCG 389 and pelvic US showing thickened, heterogeneous endometrium with internal blood flow, suspicious for retained products of conception. She was seen by Dr Ihor Dow, with quantitative HCG 1374 on 07-26-14 and 1922 on 07-28-14. [place] 08-02-14 had 1 cm area in central uterus suspicious for retained products of conception, with no gestational sac and 3.3 cm hemorrhagic cyst on left ovary. She had D & C hysteroscopy by Dr Ihor Dow on 08-10-14, uterus 6 week size and very small amount of tissue. Pathology [phone]) was consistent with placental site reaction and retained products of conception. Quantitative hCG 08-25-14 was 4856 and repeat on 09-14-14 was 6736. CT  CAP 09-09-14 had no findings of concern, with a few tiny sub-cm pulmonary nodules on left thought likely post-inflammatory. She was seen by Dr Denman George on 09-14-14, with CT findings reassuring and methotrexate recommended due to further rise in hCG. She had Implanon placed RUE 06-2014 for contraception. TSH on 08-25-14 was 2.176. First methotrexate was 09-20-2014, given weekly x 7 thru 10-31-14. HCG initially dropped, then plateau by week 7: HCG results: 07-04-14 389 07-26-2014 1374 07-28-2014 1922     D& C 08-10-14 08-25-14 4856 09-14-14 6736 09-20-14 4975 First MTX 09-25-14 4136 09-27-14 3385 Second MTX 10-04-14 2394. Third MTX 10-11-14 2378 Fourth MTX 10-17-14 1928.8 Fifth MTX 10-24-2014 2287 Sixth MTX 10-31-14   2004.7  Seventh MTX 11-11-14  2693.3  Review of systems as above, also: Appetite has been good, still drinking some supplements. Bowels moving regularly. No SOB, no new or different pain, no LE swelling, no bleeding Remainder of 10 point Review of Systems negative.  Objective:  Vital signs in last 24 hours:  BP 121/77 mmHg  Pulse 80  Temp(Src) 97.5 F (36.4 C) (Oral)  Resp 18  Ht $R'4\' 10"'lh$  (1.473 m)  Wt 91 lb 9.6 oz (41.549 kg)  BMI 19.15 kg/m2  SpO2 100% Weight is up 1 lb. Alert, oriented and appropriate. Ambulatory without difficulty.  No alopecia  HEENT:PERRL, sclerae not icteric. Oral mucosa moist without lesions, posterior pharynx clear.  Neck supple. No JVD.  Lymphatics:no cervical,supraclavicular or inguinal adenopathy Resp: clear to auscultation bilaterally and normal percussion bilaterally Cardio: regular rate and rhythm. No gallop. GI: soft, nontender including LLQ, not distended, no mass or organomegaly. Normally active bowel sounds.  Musculoskeletal/ Extremities: without pitting edema, cords, tenderness. Incision 0.5 cm left upper arm from implant removal clean, no erythema, no bleeding, clean dressing. Neuro: no peripheral neuropathy. Otherwise nonfocal.  PSYCH appropriate mood and affect Skin without rash, ecchymosis, petechiae   Lab Results:  Results for orders placed or performed in visit on 11/11/14  CBC with Differential  Result Value Ref Range   WBC 6.2 3.9 - 10.3 10e3/uL   NEUT# 2.3 1.5 - 6.5 10e3/uL   HGB 11.5 (L) 11.6 - 15.9 g/dL   HCT 35.3 34.8 - 46.6 %   Platelets 215 145 - 400 10e3/uL   MCV 93.7 79.5 - 101.0 fL   MCH 30.7 25.1 - 34.0 pg   MCHC 32.7 31.5 - 36.0 g/dL   RBC 3.77 3.70 - 5.45 10e6/uL   RDW 14.0 11.2 - 14.5 %   lymph# 3.0 0.9 - 3.3 10e3/uL   MONO# 0.6 0.1 - 0.9 10e3/uL   Eosinophils Absolute 0.3 0.0 - 0.5 10e3/uL   Basophils Absolute 0.0 0.0 - 0.1 10e3/uL   NEUT% 36.7 (L) 38.4 - 76.8 %   LYMPH% 49.3 14.0 - 49.7 %   MONO% 8.9 0.0 - 14.0 %   EOS% 4.7 0.0 - 7.0 %   BASO% 0.4 0.0 - 2.0 %  Comprehensive metabolic panel (Cmet) - [facility]  Result Value Ref Range   Sodium 142 136 - 145 mEq/L   Potassium 4.6 3.5 - 5.1 mEq/L   Chloride 107 98 - 109 mEq/L   CO2 23 22 - 29 mEq/L   Glucose 87 70 - 140 mg/dl   BUN 8.1 7.0 - 26.0 mg/dL   Creatinine 0.6 0.6 - 1.1 mg/dL   Total Bilirubin 0.74 0.20 - 1.20 mg/dL   Alkaline Phosphatase 52 40 - 150 U/L   AST 18 5 - 34 U/L   ALT 14 0 - 55 U/L   Total Protein 7.7 6.4 - 8.3 g/dL   Albumin 4.4 3.5 - 5.0 g/dL   Calcium 9.2 8.4 - 10.4 mg/dL   Anion Gap 12 (H) 3 - 11 mEq/L   EGFR >90 >90 ml/min/1.73 m2    HCG available after visit 2693.3 (see serial results above)  Studies/Results:  Ct Head W Wo Contrast  11/09/2014   CLINICAL DATA:  Gestational trophoblastic neoplasm with increasing HCG.  EXAM: CT HEAD WITHOUT AND WITH CONTRAST  TECHNIQUE: Contiguous axial images were obtained from the base of the skull through the vertex without and with intravenous contrast  CONTRAST:  30mL OMNIPAQUE IOHEXOL 300 MG/ML  SOLN  COMPARISON:  None.  FINDINGS: No acute cortical infarct, hemorrhage, or mass lesion is present. The ventricles are of normal size. No significant extra-axial fluid  collection is evident. The paranasal sinuses and mastoid air cells are clear. The calvarium is intact.  Postcontrast images demonstrate no pathologic enhancement.  IMPRESSION: Negative CT of the head without and with contrast. No evidence for intracranial tumor.   Electronically Signed   By: San Morelle M.D.   On: 11/09/2014 17:02   Ct Chest W Contrast  11/10/2014   CLINICAL DATA:  Patient with left lower quadrant pain on and off. Increased fatigue. Re-stage gestational trophoblastic neoplasm.  EXAM: CT CHEST, ABDOMEN, AND PELVIS WITH CONTRAST  TECHNIQUE: Multidetector CT imaging of the chest, abdomen and pelvis was performed following the standard protocol during bolus administration of intravenous contrast.  CONTRAST:  70mL OMNIPAQUE IOHEXOL 300 MG/ML  SOLN  COMPARISON:  CT chest abdomen pelvis 09/09/2014  FINDINGS: CT CHEST  FINDINGS  Mediastinum/Nodes: No enlarged axillary, mediastinal or hilar lymphadenopathy. Normal heart size. No pericardial effusion. Aorta and main pulmonary artery normal in caliber.  Lungs/Pleura: Central airways are patent. Unchanged 3 mm left lower lobe pulmonary nodule (image 39; series 7). Unchanged 2 mm left lower lobe nodule (image 34; series 7). No pleural effusion or pneumothorax. Unchanged 3 mm nodule within the lingula (image 33; series 7).  Musculoskeletal: No aggressive or acute appearing osseous lesions.  CT ABDOMEN AND PELVIS FINDINGS  Hepatobiliary: Liver is normal in size and contour without focal hepatic lesion identified. Gallbladder is unremarkable. No intrahepatic or extrahepatic biliary ductal dilatation.  Pancreas: Unremarkable  Spleen: Unremarkable  Adrenals/Urinary Tract: Normal adrenal glands. Kidneys enhance symmetrically with contrast. No hydronephrosis.  Stomach/Bowel: No abnormal bowel wall thickening or evidence for bowel obstruction. Normal appendix.  Vascular/Lymphatic: Normal caliber abdominal aorta. No retroperitoneal lymphadenopathy. 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  Other: The uterus and adnexal structures are unremarkable.  Musculoskeletal: No aggressive or acute appearing osseous lesions.  IMPRESSION: Unchanged tiny left lower lobe and lingular pulmonary nodules, favored to be post infectious or inflammatory in etiology.  No evidence for malignancy within the chest, abdomen or pelvis.   Electronically Signed   By: Lovey Newcomer M.D.   On: 11/10/2014 08:34   Ct Abdomen Pelvis W Contrast  11/10/2014   CLINICAL DATA:  Patient with left lower quadrant pain on and off. Increased fatigue. Re-stage gestational trophoblastic neoplasm.  EXAM: CT CHEST, ABDOMEN, AND PELVIS WITH CONTRAST  TECHNIQUE: Multidetector CT imaging of the chest, abdomen and pelvis was performed following the standard protocol during bolus administration of intravenous contrast.  CONTRAST:  89mL OMNIPAQUE IOHEXOL 300 MG/ML  SOLN  COMPARISON:  CT chest abdomen pelvis 09/09/2014  FINDINGS: CT CHEST FINDINGS  Mediastinum/Nodes: No enlarged axillary, mediastinal or hilar lymphadenopathy. Normal heart size. No pericardial effusion. Aorta and main pulmonary artery normal in caliber.  Lungs/Pleura: Central airways are patent. Unchanged 3 mm left lower lobe pulmonary nodule (image 39; series 7). Unchanged 2 mm left lower lobe nodule (image 34; series 7). No pleural effusion or pneumothorax. Unchanged 3 mm nodule within the lingula (image 33; series 7).  Musculoskeletal: No aggressive or acute appearing osseous lesions.  CT ABDOMEN AND PELVIS FINDINGS  Hepatobiliary: Liver is normal in size and contour without focal hepatic lesion identified. Gallbladder is unremarkable. No intrahepatic or extrahepatic biliary ductal dilatation.  Pancreas: Unremarkable  Spleen: Unremarkable  Adrenals/Urinary Tract: Normal adrenal glands. Kidneys enhance symmetrically with contrast. No hydronephrosis.  Stomach/Bowel: No abnormal bowel wall thickening or evidence for bowel obstruction. Normal  appendix.  Vascular/Lymphatic: Normal caliber abdominal aorta. No retroperitoneal lymphadenopathy. 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  Other: The uterus and adnexal structures are unremarkable.  Musculoskeletal: No aggressive or acute appearing osseous lesions.  IMPRESSION: Unchanged tiny left lower lobe and lingular pulmonary nodules, favored to be post infectious or inflammatory in etiology.  No evidence for malignancy within the chest, abdomen or pelvis.   Electronically Signed   By: Lovey Newcomer M.D.   On: 11/10/2014 08:34     PACs images reviewed by MD and discussed with Dr Denman George. Images shown to patient now.  Medications: I have reviewed the patient's current medications. She may need additional antiemetics with dactinomycin, tells me again that zofran caused rash. She has tolerated compazine and has this available; she does not tolerate phenergan. We could try ativan if needed, and probably could also try EMEND,  which would need preauth. Note reported zofran rash, which is listed as 1% side effect with that drug.    DISCUSSION:  WHO prognostic score calculated 5, low risk:  age <40 (0), term pregnancy (2), months from end of pregnancy to start of chemo 4-6 (1), pre Rx HCG <10,000 (0), largest <3 cm (0), # mets (0), failed prior single agent chemo (2).   Note human placental lactogen level on 09-27-2014 was <0.10, normal.  Case discussed with Dr Denman George directly prior to visit today, including confirmation of Actinomycin D pulsed dose regimen, this 1.25 mg/m2 every other week per [facility] protocol information.  Discussed HCG marker results thru 10-31-14 (not available today at time of visit), need for change in chemotherapy and recommendation for Actinomycin D, scan information as above, need for central line for administration of Actinomycin D. She understands need for absolute compliance with depoProvera injections. Total number of treatments will depend on how quickly the HCG elevation  resolves. RN has done teaching and also reviewed PICC care, including flushes at this office twice weekly and dressing changes weekly. Patient has given consent for this treatment. Prior authorization obtained for Actinomycin D.  Assessment/Plan: 1.GTN with rising hCG 3.5 months from full term pregnancy/ loss of one twin at 10 weeks, in otherwise healthy 24 yo lady. Has had 7 cycles weekly MTX, marker plateau'd and restaging scans without evidence of metastatic disease. She will have PICC placed on 11-14-14 and begin actinomycin 1.25 mg/m2 that day, once every other week, following HCG levels.  I will have IVF available on 4-26 if needed for nausea, as she will be at [facility] for dressing change of  new PICC that day. 2.Contraception necessary for a full year after completing GTN treatment, during ongoing monitoring of HCG marker then. Patient insisted that Nexplanon be removed on 11-10-14, given depoProvera that day, due next depoProvera in 3 months (12 weeks = July 14) 3.sub-cm scattered pulmonary nodules left lung not thought likely related to GTN at time of last imaging and stable on restaging scan now. 4.LLQ abdominal pain since delivery, no findings that seem to correlate on imaging, gradually has improved. 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 Pyelonephritis 2014, resolved with oral antibiotics  6.likely environmental allergies: improved 7.underweight due to inadequate caloric intake: improving   All questions answered. Patient understands recommendations and plans, and is in agreement. Chemo orders revised on careplan, to begin 11-14-14, IVF if needed on 4-26. Scheduling requests submitted. Time spent > 60 min including >50% counseling and coordination of care.   LIVESAY,LENNIS P, MD   11/11/2014, 1:05 PM

## 2014-11-12 LAB — HCG, QUANTITATIVE, PREGNANCY: HCG, BETA CHAIN, QUANT, S: 2693.3 m[IU]/mL

## 2014-11-13 ENCOUNTER — Other Ambulatory Visit: Payer: Self-pay | Admitting: Oncology

## 2014-11-13 MED ORDER — SODIUM CHLORIDE 0.9 % IV SOLN: INTRAVENOUS | Status: DC

## 2014-11-13 MED ORDER — PROCHLORPERAZINE EDISYLATE 5 MG/ML IJ SOLN: 10.0000 mg | Freq: Once | INTRAMUSCULAR | Status: DC | PRN

## 2014-11-13 MED ORDER — SODIUM CHLORIDE 0.9 % IV SOLN: 4.0000 mg | Freq: Once | INTRAVENOUS | Status: DC | PRN

## 2014-11-13 NOTE — Progress Notes (Signed)
ADDENDUM Correction to WHO score  6, low risk Pretreatment HCG >1000 (1)  L.Marko Plume, Md

## 2014-11-14 ENCOUNTER — Other Ambulatory Visit (HOSPITAL_BASED_OUTPATIENT_CLINIC_OR_DEPARTMENT_OTHER): Payer: Medicaid Other

## 2014-11-14 ENCOUNTER — Other Ambulatory Visit: Payer: Self-pay | Admitting: Oncology

## 2014-11-14 ENCOUNTER — Ambulatory Visit: Payer: Self-pay | Admitting: Oncology

## 2014-11-14 ENCOUNTER — Emergency Department (HOSPITAL_COMMUNITY)
Admission: EM | Admit: 2014-11-14 | Discharge: 2014-11-14 | Disposition: A | Discharge status: Home / Self Care | Payer: Medicaid Other | Attending: Emergency Medicine | Admitting: Emergency Medicine

## 2014-11-14 ENCOUNTER — Ambulatory Visit (HOSPITAL_COMMUNITY)
Admission: RE | Admit: 2014-11-14 | Discharge: 2014-11-14 | Disposition: A | Discharge status: Home / Self Care | Payer: Medicaid Other | Source: Ambulatory Visit | Attending: Interventional Radiology | Admitting: Interventional Radiology

## 2014-11-14 ENCOUNTER — Other Ambulatory Visit: Payer: Medicaid Other

## 2014-11-14 ENCOUNTER — Ambulatory Visit (HOSPITAL_BASED_OUTPATIENT_CLINIC_OR_DEPARTMENT_OTHER): Payer: Medicaid Other

## 2014-11-14 ENCOUNTER — Other Ambulatory Visit: Payer: Self-pay

## 2014-11-14 ENCOUNTER — Ambulatory Visit: Payer: Medicaid Other | Admitting: Oncology

## 2014-11-14 ENCOUNTER — Ambulatory Visit: Payer: Medicaid Other

## 2014-11-14 ENCOUNTER — Telehealth: Payer: Self-pay | Admitting: Oncology

## 2014-11-14 VITALS — BP 124/78 | HR 72 | Temp 98.3°F

## 2014-11-14 DIAGNOSIS — O019 Hydatidiform mole, unspecified: Secondary | ICD-10-CM

## 2014-11-14 DIAGNOSIS — Z452 Encounter for adjustment and management of vascular access device: Secondary | ICD-10-CM | POA: Diagnosis not present

## 2014-11-14 DIAGNOSIS — R55 Syncope and collapse: Secondary | ICD-10-CM | POA: Insufficient documentation

## 2014-11-14 DIAGNOSIS — Z5111 Encounter for antineoplastic chemotherapy: Secondary | ICD-10-CM

## 2014-11-14 DIAGNOSIS — E860 Dehydration: Secondary | ICD-10-CM

## 2014-11-14 DIAGNOSIS — O010 Classical hydatidiform mole: Secondary | ICD-10-CM

## 2014-11-14 DIAGNOSIS — Z8742 Personal history of other diseases of the female genital tract: Secondary | ICD-10-CM | POA: Insufficient documentation

## 2014-11-14 DIAGNOSIS — C58 Malignant neoplasm of placenta: Secondary | ICD-10-CM | POA: Diagnosis not present

## 2014-11-14 DIAGNOSIS — Z79899 Other long term (current) drug therapy: Secondary | ICD-10-CM | POA: Insufficient documentation

## 2014-11-14 LAB — COMPREHENSIVE METABOLIC PANEL (CC13)
ALT: 17 U/L (ref 0–55)
AST: 19 U/L (ref 5–34)
Albumin: 4.4 g/dL (ref 3.5–5.0)
Alkaline Phosphatase: 50 U/L (ref 40–150)
Anion Gap: 13 mEq/L — ABNORMAL HIGH (ref 3–11)
BUN: 7.1 mg/dL (ref 7.0–26.0)
CO2: 21 meq/L — AB (ref 22–29)
CREATININE: 0.6 mg/dL (ref 0.6–1.1)
Calcium: 9.9 mg/dL (ref 8.4–10.4)
Chloride: 107 mEq/L (ref 98–109)
EGFR: >90 mL/min/{1.73_m2} (ref >90)
Glucose: 129 mg/dl (ref 70–140)
Potassium: 4 mEq/L (ref 3.5–5.1)
SODIUM: 140 meq/L (ref 136–145)
TOTAL PROTEIN: 7.6 g/dL (ref 6.4–8.3)
Total Bilirubin: 0.76 mg/dL (ref 0.20–1.20)

## 2014-11-14 LAB — CBC WITH DIFFERENTIAL/PLATELET
BASO%: 0.4 % (ref 0.0–2.0)
BASOS ABS: 0 10*3/uL (ref 0.0–0.1)
EOS ABS: 0.1 10*3/uL (ref 0.0–0.5)
EOS%: 2 % (ref 0.0–7.0)
HCT: 35.7 % (ref 34.8–46.6)
HGB: 12 g/dL (ref 11.6–15.9)
LYMPH%: 46.7 % (ref 14.0–49.7)
MCH: 31.7 pg (ref 25.1–34.0)
MCHC: 33.6 g/dL (ref 31.5–36.0)
MCV: 94.2 fL (ref 79.5–101.0)
MONO#: 0.6 10*3/uL (ref 0.1–0.9)
MONO%: 8.8 % (ref 0.0–14.0)
NEUT#: 3 10*3/uL (ref 1.5–6.5)
NEUT%: 42.1 % (ref 38.4–76.8)
NRBC: 0 % (ref 0–0)
PLATELETS: 204 10*3/uL (ref 145–400)
RBC: 3.79 10*6/uL (ref 3.70–5.45)
RDW: 13.7 % (ref 11.2–14.5)
WBC: 7.1 10*3/uL (ref 3.9–10.3)
lymph#: 3.3 10*3/uL (ref 0.9–3.3)

## 2014-11-14 LAB — I-STAT CHEM 8, ED
BUN: 8 mg/dL (ref 6–23)
CALCIUM ION: 1.25 mmol/L — AB (ref 1.12–1.23)
CHLORIDE: 104 mmol/L (ref 96–112)
Creatinine, Ser: 0.5 mg/dL (ref 0.50–1.10)
Glucose, Bld: 114 mg/dL — ABNORMAL HIGH (ref 70–99)
HEMATOCRIT: 41 % (ref 36.0–46.0)
Hemoglobin: 13.9 g/dL (ref 12.0–15.0)
Potassium: 4.6 mmol/L (ref 3.5–5.1)
Sodium: 139 mmol/L (ref 135–145)
TCO2: 21 mmol/L (ref 0–100)

## 2014-11-14 MED ORDER — SODIUM CHLORIDE 0.9 % IV SOLN
1.2500 mg/m2 | Freq: Once | INTRAVENOUS | Status: AC
  Administered 2014-11-14: 1650 ug via INTRAVENOUS
  Filled 2014-11-14: qty 3.3

## 2014-11-14 MED ORDER — LIDOCAINE HCL 1 % IJ SOLN
INTRAMUSCULAR | Status: AC
  Filled 2014-11-14: qty 20

## 2014-11-14 MED ORDER — PROCHLORPERAZINE EDISYLATE 5 MG/ML IJ SOLN
10.0000 mg | Freq: Once | INTRAMUSCULAR | Status: AC
  Administered 2014-11-14: 10 mg via INTRAVENOUS

## 2014-11-14 MED ORDER — PROCHLORPERAZINE EDISYLATE 5 MG/ML IJ SOLN
10.0000 mg | Freq: Once | INTRAMUSCULAR | Status: AC
  Administered 2014-11-14: 10 mg via INTRAVENOUS
  Filled 2014-11-14: qty 2

## 2014-11-14 MED ORDER — SODIUM CHLORIDE 0.9 % IV SOLN
Freq: Once | INTRAVENOUS | Status: AC
  Administered 2014-11-14: 13:00:00 via INTRAVENOUS

## 2014-11-14 MED ORDER — SODIUM CHLORIDE 0.9 % IV BOLUS (SEPSIS)
1000.0000 mL | Freq: Once | INTRAVENOUS | Status: AC
  Administered 2014-11-14: 1000 mL via INTRAVENOUS

## 2014-11-14 MED ORDER — SODIUM CHLORIDE 0.9 % IJ SOLN
10.0000 mL | INTRAMUSCULAR | Status: DC | PRN
  Administered 2014-11-14: 10 mL via INTRAVENOUS
  Filled 2014-11-14: qty 10

## 2014-11-14 MED ORDER — SODIUM CHLORIDE 0.9 % IV SOLN
10.0000 mg | Freq: Once | INTRAVENOUS | Status: AC
  Administered 2014-11-14: 10 mg via INTRAVENOUS
  Filled 2014-11-14: qty 1

## 2014-11-14 MED ORDER — DIPHENHYDRAMINE HCL 50 MG/ML IJ SOLN
25.0000 mg | Freq: Once | INTRAMUSCULAR | Status: AC
  Administered 2014-11-14: 25 mg via INTRAVENOUS
  Filled 2014-11-14: qty 1

## 2014-11-14 MED ORDER — HEPARIN SOD (PORK) LOCK FLUSH 100 UNIT/ML IV SOLN
500.0000 [IU] | Freq: Once | INTRAVENOUS | Status: AC
  Administered 2014-11-14: 500 [IU]
  Filled 2014-11-14: qty 5

## 2014-11-14 MED ORDER — SODIUM CHLORIDE 0.9 % IJ SOLN
10.0000 mL | INTRAMUSCULAR | Status: DC | PRN
  Administered 2014-11-14: 10 mL
  Filled 2014-11-14: qty 10

## 2014-11-14 MED ORDER — HEPARIN SOD (PORK) LOCK FLUSH 100 UNIT/ML IV SOLN
500.0000 [IU] | Freq: Once | INTRAVENOUS | Status: AC
  Administered 2014-11-14: 250 [IU] via INTRAVENOUS
  Filled 2014-11-14: qty 5

## 2014-11-14 MED ORDER — HEPARIN SOD (PORK) LOCK FLUSH 100 UNIT/ML IV SOLN
250.0000 [IU] | Freq: Once | INTRAVENOUS | Status: AC | PRN
  Administered 2014-11-14: 250 [IU]
  Filled 2014-11-14: qty 5

## 2014-11-14 NOTE — Procedures (Signed)
LUE PICC 35 cm SVC RA No comp

## 2014-11-14 NOTE — ED Provider Notes (Signed)
CSN: [id number]     Arrival date & time 11/14/14  1504 History   First MD Initiated Contact with Patient 11/14/14 1535     Chief Complaint  Patient presents with  . Near Syncope     (Consider location/radiation/quality/duration/timing/severity/associated sxs/prior Treatment) HPI  24 year old female presents after [place] syncopal [place] the bus stop. The patient has a history of gestational trophoblastic neoplasm and is currently on chemotherapy. She's been on chemotherapy for last 2-3 months but had a change in medicine today. After she finished the infusion she felt a little dizzy and was dizzy walking to the bus stop. When she got there though her dizziness worsened, she became nauseated, and passed out. Did not hit her head. EMS found her sitting on the ground after about 1 minute of being unconscious. No seizure-like activity witnessed by bystanders. The patient states she feels a little dizzy still. Denies headache, chest pain, shortness breath, vomiting, or urinary symptoms. No abdominal pain.  Past Medical History  Diagnosis Date  . Pyelonephritis    Past Surgical History  Procedure Laterality Date  . No past surgeries    . Dilation and evacuation N/A 08/10/2014    Procedure: DILATATION AND EVACUATION;  Surgeon: Lavonia Drafts, MD;  Location: [facility];  Service: Gynecology;  Laterality: N/A;  . Hysteroscopy N/A 08/10/2014    Procedure: HYSTEROSCOPY;  Surgeon: Lavonia Drafts, MD;  Location: [facility];  Service: Gynecology;  Laterality: N/A;   Family History  Problem Relation Age of Onset  . Alcohol abuse Neg Hx    History  Substance Use Topics  . Smoking status: Never Smoker   . Smokeless tobacco: Never Used  . Alcohol Use: No   OB History    Gravida Para Term Preterm AB TAB SAB Ectopic Multiple Living   1 1 1       0 1     Review of Systems  Respiratory: Negative for shortness of breath.   Cardiovascular: Negative for chest pain.  Gastrointestinal: Positive  for nausea. Negative for abdominal pain.  Neurological: Positive for dizziness and syncope.  All other systems reviewed and are negative.     Allergies  Ginger; Okra; Ondansetron; and Promethazine  Home Medications   Prior to Admission medications   Medication Sig Start Date End Date Taking? Authorizing Provider  polyethylene glycol powder (GLYCOLAX/MIRALAX) powder Take 17 g by mouth daily. 10/31/14  Yes Lennis Marion Downer, MD  prochlorperazine (COMPAZINE) 10 MG tablet Take 1 tablet (10 mg total) by mouth every 6 (six) hours as needed for nausea or vomiting. 10/31/14  Yes Lennis Marion Downer, MD  sennosides-docusate sodium (SENOKOT-S) 8.6-50 MG tablet Take 1 tablet by mouth 2 (two) times daily. Patient taking differently: Take 1 tablet by mouth daily.  10/31/14  Yes Lennis P Livesay, MD   BP 99/54 mmHg  Pulse 88  Temp(Src) 97.8 F (36.6 C) (Oral)  Resp 16  SpO2 94% Physical Exam  Constitutional: She is oriented to person, place, and time. She appears well-developed and well-nourished.  HENT:  Head: Normocephalic and atraumatic.  Right Ear: External ear normal.  Left Ear: External ear normal.  Nose: Nose normal.  Eyes: EOM are normal. Pupils are equal, round, and reactive to light. Right eye exhibits no discharge. Left eye exhibits no discharge.  Neck: Neck supple.  Cardiovascular: Normal rate, regular rhythm and normal heart sounds.   Pulmonary/Chest: Effort normal and breath sounds normal.  Abdominal: Soft. There is no tenderness.  Neurological: She is alert and oriented  to person, place, and time.  CN 2-12 grossly intact. 5/5 strength in all 4 extremities  Skin: Skin is warm and dry.  Vitals reviewed.   ED Course  Procedures (including critical care time) Labs Review Labs Reviewed  I-STAT CHEM 8, ED - Abnormal; Notable for the following:    Glucose, Bld 114 (*)    Calcium, Ion 1.25 (*)    All other components within normal limits    Imaging Review No results found.    EKG Interpretation   Date/Time:  Monday November 14 2014 15:32:17 EDT Ventricular Rate:  75 PR Interval:  128 QRS Duration: 72 QT Interval:  390 QTC Calculation: 435 R Axis:   97 Text Interpretation:  Normal sinus rhythm Rightward axis Borderline ECG No  old tracing to compare Confirmed by Waelder  MD, Aliea Bobe ([id number]) on  11/14/2014 3:58:52 PM      ED ECG REPORT ECG #2  Date: 11/14/2014  Rate: 152  Rhythm: sinus tachycardia  QRS Axis: normal  Intervals: normal  ST/T Wave abnormalities: normal  Conduction Disutrbances:none  Narrative Interpretation: Sinus Tachycardia  Old EKG Reviewed: changes noted  I have personally reviewed the EKG tracing and agree with the computerized printout as noted.  MDM   Final diagnoses:  Syncope, unspecified syncope type  Dehydration    Patient feels better after IV fluids. She did have a couple episodes of emesis, with each episode of emesis she significantly increase her heart rate and the 140s and 150s. This then trends back down. She is able to drink some fluids after being given anti-medics. This is all likely related to her chemotherapy. I feel that the chemotherapy likely cause some orthostatic hypotension. She was given 2 L IV fluids and feels better. Exam is unremarkable. She is still mildly tachycardic (low 100s) but BP is better. Wants to go home, I discussed the importance of following up closely with PCP and returning if any symptoms worsen.    Sherwood Gambler, MD 11/14/14 [phone]

## 2014-11-14 NOTE — ED Notes (Addendum)
Per EMS-found sitting on the ground. Syncopal episode for approximately a minute. Regained consciousness after she was put on stretcher. Just finished chemo treatment at [facility] today. VS: BP 104/65 HR 82 RR 16 SpO2 98%. GCS 15. Being treated for gestational trophoblastic neoplasm. A&Ox4. CBG 179 mg/dl. 12 Lead unremarkable.

## 2014-11-14 NOTE — ED Notes (Signed)
Awake. Verbally responsive. A/O x4. Resp even and unlabored. No audible adventitious breath sounds noted. ABC's intact. Pt reported that after taking chemo she almost past out.

## 2014-11-14 NOTE — Patient Instructions (Signed)
PICC Home Guide A peripherally inserted central catheter (PICC) is a long, thin, flexible tube that is inserted into a vein in the upper arm. It is a form of intravenous (IV) access. It is considered to be a "central" line because the tip of the PICC ends in a large vein in your chest. This large vein is called the superior vena cava (SVC). The PICC tip ends in the SVC because there is a lot of blood flow in the SVC. This allows medicines and IV fluids to be quickly distributed throughout the body. The PICC is inserted using a sterile technique by a specially trained nurse or physician. After the PICC is inserted, a chest X-ray exam is done to be sure it is in the correct place.  A PICC may be placed for different reasons, such as:  To give medicines and liquid nutrition that can only be given through a central line. Examples are:  Certain antibiotic treatments.  Chemotherapy.  Total parenteral nutrition (TPN).  To take frequent blood samples.  To give IV fluids and blood products.  If there is difficulty placing a peripheral intravenous (PIV) catheter. If taken care of properly, a PICC can remain in place for several months. A PICC can also allow a person to go home from the hospital early. Medicine and PICC care can be managed at home by a family member or home health care team. WHAT PROBLEMS CAN HAPPEN WHEN I HAVE A PICC? Problems with a PICC can occasionally occur. These may include the following:  A blood clot (thrombus) forming in or at the tip of the PICC. This can cause the PICC to become clogged. A clot-dissolving medicine called tissue plasminogen activator (tPA) can be given through the PICC to help break up the clot.  Inflammation of the vein (phlebitis) in which the PICC is placed. Signs of inflammation may include redness, pain at the insertion site, red streaks, or being able to feel a "cord" in the vein where the PICC is located.  Infection in the PICC or at the insertion  site. Signs of infection may include fever, chills, redness, swelling, or pus drainage from the PICC insertion site.  PICC movement (malposition). The PICC tip may move from its original position due to excessive physical activity, forceful coughing, sneezing, or vomiting.  A break or cut in the PICC. It is important to not use scissors near the PICC.  Nerve or tendon irritation or injury during PICC insertion. WHAT SHOULD I KEEP IN MIND ABOUT ACTIVITIES WHEN I HAVE A PICC?  You may bend your arm and move it freely. If your PICC is near or at the bend of your elbow, avoid activity with repeated motion at the elbow.  Rest at home for the remainder of the day following PICC line insertion.  Avoid lifting heavy objects as instructed by your health care provider.  Avoid using a crutch with the arm on the same side as your PICC. You may need to use a walker. WHAT SHOULD I KNOW ABOUT MY PICC DRESSING?  Keep your PICC bandage (dressing) clean and dry to prevent infection.  Ask your health care provider when you may shower. Ask your health care provider to teach you how to wrap the PICC when you do take a shower.  Change the PICC dressing as instructed by your health care provider.  Change your PICC dressing if it becomes loose or wet. WHAT SHOULD I KNOW ABOUT PICC CARE?  Check the PICC insertion site   daily for leakage, redness, swelling, or pain.  Do not take a bath, swim, or use hot tubs when you have a PICC. Cover PICC line with clear plastic wrap and tape to keep it dry while showering.  Flush the PICC as directed by your health care provider. Let your health care provider know right away if the PICC is difficult to flush or does not flush. Do not use force to flush the PICC.  Do not use a syringe that is less than 10 mL to flush the PICC.  Never pull or tug on the PICC.  Avoid blood pressure checks on the arm with the PICC.  Keep your PICC identification card with you at all  times.  Do not take the PICC out yourself. Only a trained clinical professional should remove the PICC. SEEK IMMEDIATE MEDICAL CARE IF:  Your PICC is accidentally pulled all the way out. If this happens, cover the insertion site with a bandage or gauze dressing. Do not throw the PICC away. Your health care provider will need to inspect it.  Your PICC was tugged or pulled and has partially come out. Do not  push the PICC back in.  There is any type of drainage, redness, or swelling where the PICC enters the skin.  You cannot flush the PICC, it is difficult to flush, or the PICC leaks around the insertion site when it is flushed.  You hear a "flushing" sound when the PICC is flushed.  You have pain, discomfort, or numbness in your arm, shoulder, or jaw on the same side as the PICC.  You feel your heart "racing" or skipping beats.  You notice a hole or tear in the PICC.  You develop chills or a fever. MAKE SURE YOU:   Understand these instructions.  Will watch your condition.  Will get help right away if you are not doing well or get worse. Document Released: 01/12/2003 Document Revised: 11/22/2013 Document Reviewed: 03/15/2013 ExitCare Patient Information 2015 [employer]. This information is not intended to replace advice given to you by your health care provider. Make sure you discuss any questions you have with your health care provider.  

## 2014-11-14 NOTE — ED Notes (Signed)
Bed: [facility] Expected date:  Expected time:  Means of arrival:  Comments: EMS-syncope

## 2014-11-14 NOTE — Progress Notes (Signed)
Reviewed appointment schedule with Shelly Koch.  She atates that she is moving May 1 and will not have the help of boyfriend's sister for child care.  She also has to take 2 busses to get to the [facility].  She does not want to bring the baby to the cancer center either. Emphasized the importance of the the treatments and flushes of PICC line. Discussed situation with SSW Shelly Koch.  Lauren to see Shelly Koch in the infusion room today to discuss transportation and child care issue.

## 2014-11-14 NOTE — ED Notes (Signed)
Bed: WA12 Expected date:  Expected time:  Means of arrival:  Comments: [facility] 

## 2014-11-14 NOTE — ED Notes (Signed)
Awake. Verbally responsive. A/O x4. Resp even and unlabored. No audible adventitious breath sounds noted. ABC's intact. IV infusing NS at 999ml/hr without difficulty. 

## 2014-11-14 NOTE — Telephone Encounter (Signed)
added appts per pof....per pof pt will get sced today at visit

## 2014-11-14 NOTE — ED Notes (Signed)
Pt Dc'd per [facility]. Pt alert x4 respirations easy non labored.

## 2014-11-14 NOTE — Patient Instructions (Addendum)
[facility] Discharge Instructions for Patients Receiving Chemotherapy  Today you received the following chemotherapy agents dactinomycin   To help prevent nausea and vomiting after your treatment, we encourage you to take your nausea medication as directed   If you develop nausea and vomiting that is not controlled by your nausea medication, call the clinic.   BELOW ARE SYMPTOMS THAT SHOULD BE REPORTED IMMEDIATELY:  *FEVER GREATER THAN 100.5 F  *CHILLS WITH OR WITHOUT FEVER  NAUSEA AND VOMITING THAT IS NOT CONTROLLED WITH YOUR NAUSEA MEDICATION  *UNUSUAL SHORTNESS OF BREATH  *UNUSUAL BRUISING OR BLEEDING  TENDERNESS IN MOUTH AND THROAT WITH OR WITHOUT PRESENCE OF ULCERS  *URINARY PROBLEMS  *BOWEL PROBLEMS  UNUSUAL RASH Items with * indicate a potential emergency and should be followed up as soon as possible.  Feel free to call the clinic you have any questions or concerns. The clinic phone number is [phone].  Dactinomycin, Actinomycin D injection What is this medicine? DACTINOMYCIN (dak ti noe MYE sin) is a chemotherapy drug. It is used to treat many kinds of cancer like Wilms' tumor, some sarcomas, and placental and testicular cancers. It is also used to treat other solid tumors. This medicine may be used for other purposes; ask your health care provider or pharmacist if you have questions. COMMON BRAND NAME(S): Cosmegen What should I tell my health care provider before I take this medicine? They need to know if you have any of these conditions: -infection (especially a virus infection such as chickenpox, cold sores, or herpes) -liver disease -low blood counts like low platelets, red blood cells, white blood cells -recent radiation therapy -an unusual or allergic reaction to dactinomycin, other chemotherapy agents, other medicines, foods, dyes, or preservatives -pregnant or trying to get pregnant -breast-feeding How should I use this  medicine? This drug is given as an infusion into a vein. It is administered in a hospital or clinic by a specially trained health care professional. Talk to your pediatrician regarding the use of this medicine in children. While this drug may be prescribed for children as young as 105 months of age for selected conditions, precautions do apply. Overdosage: If you think you have taken too much of this medicine contact a poison control center or emergency room at once. NOTE: This medicine is only for you. Do not share this medicine with others. What if I miss a dose? It is important not to miss your dose. Call your doctor or health care professional if you are unable to keep an appointment. What may interact with this medicine? -medicines to increase blood counts like filgrastim, pegfilgrastim, sargramostim -vaccines This list may not describe all possible interactions. Give your health care provider a list of all the medicines, herbs, non-prescription drugs, or dietary supplements you use. Also tell them if you smoke, drink alcohol, or use illegal drugs. Some items may interact with your medicine. What should I watch for while using this medicine? Your condition will be monitored carefully while you are receiving this medicine. You will need important blood work done while you are taking this medicine. This drug may make you feel generally unwell. This is not uncommon, as chemotherapy can affect healthy cells as well as cancer cells. Report any side effects. Continue your course of treatment even though you feel ill unless your doctor tells you to stop. Call your doctor or health care professional for advice if you get a fever, chills or sore throat, or other symptoms of a cold  or flu. Do not treat yourself. This drug decreases your body's ability to fight infections. Try to avoid being around people who are sick. This medicine may increase your risk to bruise or bleed. Call your doctor or health care  professional if you notice any unusual bleeding. Be careful brushing and flossing your teeth or using a toothpick because you may get an infection or bleed more easily. If you have any dental work done, tell your dentist you are receiving this medicine. Avoid taking products that contain aspirin, acetaminophen, ibuprofen, naproxen, or ketoprofen unless instructed by your doctor. These medicines may hide a fever. Do not become pregnant while taking this medicine. Women should inform their doctor if they wish to become pregnant or think they might be pregnant. There is a potential for serious side effects to an unborn child. Talk to your health care professional or pharmacist for more information. Do not breast-feed an infant while taking this medicine. What side effects may I notice from receiving this medicine? Side effects that you should report to your doctor or health care professional as soon as possible: -allergic reactions like skin rash, itching or hives, swelling of the face, lips, or tongue -low blood counts - this medicine may decrease the number of white blood cells, red blood cells and platelets. You may be at increased risk for infections and bleeding. -signs of infection - fever or chills, cough, sore throat, pain or difficulty passing urine -signs of decreased platelets or bleeding - bruising, pinpoint red spots on the skin, black, tarry stools, blood in the urine -signs of decreased red blood cells - unusually weak or tired, fainting spells, lightheadedness -breathing problems -changes in vision -chest pain -mouth sores -pain, swelling, redness at site where injected -seizures -stomach pain, ulcers -swelling of the ankles, feet, hands, or stomach -trouble passing urine or change in the amount of urine -vomiting -yellowing of the eyes or skin Side effects that usually do not require medical attention (report to your doctor or health care professional if they continue or are  bothersome): -acne-like skin rash -darker skin color -diarrhea -hair loss -loss of appetite -nausea This list may not describe all possible side effects. Call your doctor for medical advice about side effects. You may report side effects to FDA at 1-800-FDA-1088. Where should I keep my medicine? This drug is given in a hospital or clinic and will not be stored at home. NOTE: This sheet is a summary. It may not cover all possible information. If you have questions about this medicine, talk to your doctor, pharmacist, or health care provider.  2015, Elsevier/Gold Standard. (2007-10-27 17:09:59)

## 2014-11-15 ENCOUNTER — Telehealth: Payer: Self-pay | Admitting: *Deleted

## 2014-11-15 ENCOUNTER — Ambulatory Visit (HOSPITAL_BASED_OUTPATIENT_CLINIC_OR_DEPARTMENT_OTHER): Payer: Medicaid Other

## 2014-11-15 ENCOUNTER — Other Ambulatory Visit: Payer: Self-pay | Admitting: Oncology

## 2014-11-15 ENCOUNTER — Telehealth: Payer: Self-pay

## 2014-11-15 ENCOUNTER — Other Ambulatory Visit: Payer: Self-pay

## 2014-11-15 ENCOUNTER — Other Ambulatory Visit (HOSPITAL_BASED_OUTPATIENT_CLINIC_OR_DEPARTMENT_OTHER): Payer: Medicaid Other

## 2014-11-15 VITALS — BP 119/76 | HR 99 | Temp 98.6°F | Resp 16

## 2014-11-15 DIAGNOSIS — O019 Hydatidiform mole, unspecified: Secondary | ICD-10-CM

## 2014-11-15 DIAGNOSIS — O010 Classical hydatidiform mole: Secondary | ICD-10-CM

## 2014-11-15 LAB — BASIC METABOLIC PANEL (CC13)
ANION GAP: 13 meq/L — AB (ref 3–11)
BUN: 7.6 mg/dL (ref 7.0–26.0)
CO2: 19 meq/L — AB (ref 22–29)
Calcium: 9.5 mg/dL (ref 8.4–10.4)
Chloride: 106 mEq/L (ref 98–109)
Creatinine: 0.6 mg/dL (ref 0.6–1.1)
EGFR: >90 mL/min/{1.73_m2} (ref >90)
Glucose: 150 mg/dl — ABNORMAL HIGH (ref 70–140)
Potassium: 3.6 mEq/L (ref 3.5–5.1)
SODIUM: 139 meq/L (ref 136–145)

## 2014-11-15 LAB — HCG, QUANTITATIVE, PREGNANCY: hCG, Beta Chain, Quant, S: 3275.2 m[IU]/mL

## 2014-11-15 MED ORDER — HEPARIN SOD (PORK) LOCK FLUSH 100 UNIT/ML IV SOLN
250.0000 [IU] | Freq: Once | INTRAVENOUS | Status: AC
  Administered 2014-11-15: 250 [IU] via INTRAVENOUS
  Filled 2014-11-15: qty 5

## 2014-11-15 MED ORDER — SODIUM CHLORIDE 0.9 % IV SOLN
Freq: Once | INTRAVENOUS | Status: AC
  Administered 2014-11-15: 16:00:00 via INTRAVENOUS

## 2014-11-15 MED ORDER — LORAZEPAM 0.5 MG PO TABS: ORAL_TABLET | ORAL | Status: DC

## 2014-11-15 MED ORDER — SODIUM CHLORIDE 0.9 % IV SOLN
Freq: Once | INTRAVENOUS | Status: AC
  Administered 2014-11-15: 16:00:00 via INTRAVENOUS
  Filled 2014-11-15: qty 5

## 2014-11-15 MED ORDER — SODIUM CHLORIDE 0.9 % IJ SOLN
3.0000 mL | INTRAMUSCULAR | Status: DC | PRN
  Administered 2014-11-15: 3 mL via INTRAVENOUS
  Filled 2014-11-15: qty 10

## 2014-11-15 NOTE — Telephone Encounter (Signed)
Shelly Koch stated that the compazine is not helping her nausea.  She vomited last night after her @ liters of  IVF and compazine in the ED. She has vomited X 3 today. She will receive EMEND and Decadron today with IVF at 1500 and have a B-met check per Dr. Marko Plume. Shelly Koch come for IVF tomorrow as she does not have child care or transportation.  Attempted to facilitate IVF at [facility] tomorrow at 4 pm as boyfriend would be out of work by this time. or later but there was no availability. She agreed to  come for fluids on Thursday 11-17-14 at 1230 since she was coming for a PICC flush. Called in a prescription for Ativan 0.5 mg sl or po q 4 hrs prn per Dr. Marko Plume. Reviewed instructions with patient and boyfriend. Orthostatic VS were good.  Encouraged Shelly Koch to drink at least 8 8 oz of fluid daily. Spoke with [name] SSW as to status of transportation options for patient.  She was going to look into [employer] and speak with Shelly Koch and bayfriend in the infusion room today to discuss their plan for future appointments.

## 2014-11-15 NOTE — Telephone Encounter (Signed)
-----   Message from Arty Baumgartner, RN sent at 11/14/2014  2:09 PM EDT ----- Regarding: First time chemo.  LL  First time Dactinomycin.  Dr Marko Plume [phone]

## 2014-11-15 NOTE — Telephone Encounter (Signed)
Patient has no complaints. Instructed patient to call if any issues arise. Patient verbalized understanding.

## 2014-11-16 ENCOUNTER — Other Ambulatory Visit: Payer: Self-pay

## 2014-11-16 ENCOUNTER — Telehealth: Payer: Self-pay

## 2014-11-16 ENCOUNTER — Ambulatory Visit: Payer: Medicaid Other

## 2014-11-16 DIAGNOSIS — E860 Dehydration: Secondary | ICD-10-CM

## 2014-11-16 NOTE — Telephone Encounter (Signed)
Ms. Goughnour  Stated that she has been a little nauseous but used the compazine with good effect as [facility]ivan was not ready at [facility]. ([facility]- [facility]ivan was ready for pick up yesterday,  [facility] will [facility]) She is eating small amounts of food 4-5 times a day.  She is drank ~72 oz of fluid today.   She is dizzy occasionally. She still want to recive IVF tomorrow with PICC flush at 1 pm. Ms. Esquivias has a ride to [facility] tomorrow not bus.

## 2014-11-16 NOTE — Telephone Encounter (Signed)
-----   Message from Gordy Levan, MD sent at 11/15/2014  8:15 AM EDT ----- RN please check on her by phone ~ Thurs or Fri this week thanks

## 2014-11-17 ENCOUNTER — Ambulatory Visit (HOSPITAL_BASED_OUTPATIENT_CLINIC_OR_DEPARTMENT_OTHER): Payer: Medicaid Other

## 2014-11-17 ENCOUNTER — Ambulatory Visit (HOSPITAL_COMMUNITY)
Admission: RE | Admit: 2014-11-17 | Discharge: 2014-11-17 | Disposition: A | Discharge status: Home / Self Care | Payer: Medicaid Other | Source: Ambulatory Visit | Attending: Radiology | Admitting: Radiology

## 2014-11-17 ENCOUNTER — Ambulatory Visit: Payer: Medicaid Other

## 2014-11-17 DIAGNOSIS — M79602 Pain in left arm: Secondary | ICD-10-CM | POA: Diagnosis not present

## 2014-11-17 DIAGNOSIS — E860 Dehydration: Secondary | ICD-10-CM

## 2014-11-17 MED ORDER — SODIUM CHLORIDE 0.9 % IV SOLN
Freq: Once | INTRAVENOUS | Status: AC
  Administered 2014-11-17: 13:00:00 via INTRAVENOUS

## 2014-11-17 MED ORDER — SODIUM CHLORIDE 0.9 % IJ SOLN
10.0000 mL | INTRAMUSCULAR | Status: AC | PRN
  Administered 2014-11-17: 10 mL
  Filled 2014-11-17: qty 10

## 2014-11-17 MED ORDER — HEPARIN SOD (PORK) LOCK FLUSH 100 UNIT/ML IV SOLN
500.0000 [IU] | Freq: Once | INTRAVENOUS | Status: AC
  Administered 2014-11-17: 500 [IU]

## 2014-11-17 MED ORDER — HEPARIN SOD (PORK) LOCK FLUSH 100 UNIT/ML IV SOLN
250.0000 [IU] | INTRAVENOUS | Status: AC | PRN
  Administered 2014-11-17: 250 [IU]
  Filled 2014-11-17: qty 5

## 2014-11-17 MED ORDER — HEPARIN SOD (PORK) LOCK FLUSH 100 UNIT/ML IV SOLN
INTRAVENOUS | Status: AC
  Filled 2014-11-17: qty 5

## 2014-11-17 NOTE — Progress Notes (Signed)
PICC line placed on 11/14/14  Dressing and caps changed 11/15/14.  Patient reports no pain or discomfort on 11/15/14.  Discomfort started Wednesday afternoon and got worse last night.  She describes it as a soreness and rates it a 6 or 7.  Red port flushes easily.  Purple port will not flush at all.  Cap removed and still will not flush at all.  Discussed with Jesse Fall RN.  Will start fluids through red port and she will call and discuss with Dr. Marko Plume. Discussed with Dr. Marko Plume.  Will do dye study and still give fluids today.  Called radiology and requested dye study for this afternoon. Patient will continue to get IV fluids while waiting for dye study. [id number] Patient to radiology for dye study

## 2014-11-17 NOTE — Patient Instructions (Signed)
Dehydration, Adult Dehydration is when you lose more fluids from the body than you take in. Vital organs like the kidneys, brain, and heart cannot function without a proper amount of fluids and salt. Any loss of fluids from the body can cause dehydration.  CAUSES   Vomiting.  Diarrhea.  Excessive sweating.  Excessive urine output.  Fever. SYMPTOMS  Mild dehydration  Thirst.  Dry lips.  Slightly dry mouth. Moderate dehydration  Very dry mouth.  Sunken eyes.  Skin does not bounce back quickly when lightly pinched and released.  Dark urine and decreased urine production.  Decreased tear production.  Headache. Severe dehydration  Very dry mouth.  Extreme thirst.  Rapid, weak pulse (more than 100 beats per minute at rest).  Cold hands and feet.  Not able to sweat in spite of heat and temperature.  Rapid breathing.  Blue lips.  Confusion and lethargy.  Difficulty being awakened.  Minimal urine production.  No tears. DIAGNOSIS  Your caregiver will diagnose dehydration based on your symptoms and your exam. Blood and urine tests will help confirm the diagnosis. The diagnostic evaluation should also identify the cause of dehydration. TREATMENT  Treatment of mild or moderate dehydration can often be done at home by increasing the amount of fluids that you drink. It is best to drink small amounts of fluid more often. Drinking too much at one time can make vomiting worse. Refer to the home care instructions below. Severe dehydration needs to be treated at the hospital where you will probably be given intravenous (IV) fluids that contain water and electrolytes. HOME CARE INSTRUCTIONS   Ask your caregiver about specific rehydration instructions.  Drink enough fluids to keep your urine clear or pale yellow.  Drink small amounts frequently if you have nausea and vomiting.  Eat as you normally do.  Avoid:  Foods or drinks high in sugar.  Carbonated  drinks.  Juice.  Extremely hot or cold fluids.  Drinks with caffeine.  Fatty, greasy foods.  Alcohol.  Tobacco.  Overeating.  Gelatin desserts.  Wash your hands well to avoid spreading bacteria and viruses.  Only take over-the-counter or prescription medicines for pain, discomfort, or fever as directed by your caregiver.  Ask your caregiver if you should continue all prescribed and over-the-counter medicines.  Keep all follow-up appointments with your caregiver. SEEK MEDICAL CARE IF:  You have abdominal pain and it increases or stays in one area (localizes).  You have a rash, stiff neck, or severe headache.  You are irritable, sleepy, or difficult to awaken.  You are weak, dizzy, or extremely thirsty. SEEK IMMEDIATE MEDICAL CARE IF:   You are unable to keep fluids down or you get worse despite treatment.  You have frequent episodes of vomiting or diarrhea.  You have blood or green matter (bile) in your vomit.  You have blood in your stool or your stool looks black and tarry.  You have not urinated in 6 to 8 hours, or you have only urinated a small amount of very dark urine.  You have a fever.  You faint. MAKE SURE YOU:   Understand these instructions.  Will watch your condition.  Will get help right away if you are not doing well or get worse. Document Released: 07/08/2005 Document Revised: 09/30/2011 Document Reviewed: 02/25/2011 ExitCare Patient Information 2015 [employer]. This information is not intended to replace advice given to you by your health care provider. Make sure you discuss any questions you have with your health care   provider.  

## 2014-11-18 ENCOUNTER — Other Ambulatory Visit: Payer: Self-pay | Admitting: *Deleted

## 2014-11-18 ENCOUNTER — Other Ambulatory Visit: Payer: Self-pay | Admitting: Oncology

## 2014-11-18 ENCOUNTER — Telehealth: Payer: Self-pay | Admitting: Oncology

## 2014-11-18 ENCOUNTER — Other Ambulatory Visit: Payer: Self-pay

## 2014-11-18 ENCOUNTER — Encounter: Payer: Self-pay | Admitting: *Deleted

## 2014-11-18 DIAGNOSIS — O019 Hydatidiform mole, unspecified: Secondary | ICD-10-CM

## 2014-11-18 NOTE — Progress Notes (Signed)
The patient presented on 4/28 regarding pain in left arm at PICC site. PICC line was placed 11/14/14 She states pain started 4/27 after flushing and dressing changes, she denies any swelling or change in color, denies any temperature change or loss of sensation down left arm. IR was able to flush catheter and the catheter aspirated well without difficulty. I have discussed with the patient removing PICC and placing new catheter, however since she just developed the pain and previously without pain we have decided to wait and see if the pain resolves. She was instructed to return or call if pain continues or worsens. She was educated on signs and symptoms of a blood clot and was given a contact card for IR if her left arm became swollen or developed worsening pain. She states understanding and wishes to wait for a couple days prior to removing PICC. I have tried to contact the patient today and unable to reach her, per chart review the patient would like to be re-evaluated on Monday because she is out of town over weekend.   LUE: Soft, minimal tenderness at insertion point of catheter into skin, no redness, swelling or temperature change, sensation intact, pulses intact.   Tsosie Billing PA-C Interventional Radiology  11/18/14 1:48 PM

## 2014-11-18 NOTE — Progress Notes (Signed)
[facility] Work  Clinical Social Work was referred by medical oncology RN for assessment of psychosocial needs.  Clinical Social Worker met with patient in infusion room alone and later with patient's boyfriend to offer support and assess for needs.  Shelly Koch has a 45 month old child and no childcare.  She currently rides the bus for transportation and shared she had no other options.  She shared she has used Medicaid transportation in the past, but did not like it because she had to wait a long time before and after her appointments.  She was not interested in applying for [employer] for the same reason.    CSW contacted [facility] to determine if she'd qualify for their volunteer transportation program.  They stated they do not serve patients with her "precancerous condition", even though she is treated with chemotherapy.  CSW contacted local ACS supervisor, they agreed to send patient information out to their local volunteer database and instructed volunteers to contact CSW if they are interested in helping this patient individually.  CSW had discussion with patient and boyfriend about the importance of attending all appointments and we briefly discussed importance of her caring for herself so she can care for her child long term.  Patient's boyfriend stated they would "come up with a plan" for transportation and childcare, they plan to contact CSW if they are unable to find other options.  CSW notified Shelly Koch, medical oncology RN.   Shelly Koch, MSW, LCSW, OSW-C Clinical Social Worker [facility] [phone]

## 2014-11-18 NOTE — Telephone Encounter (Addendum)
Called Shelly Koch to follow up PICC line pain as noted below in Dr. Mariana Kaufman conversation with IR at [facility]. The pain in the left arm is a 6/10.  There is no swelling or tightness in hands on the left arm. Requested that Shelly Koch  Return today to IR to have line  reevaluated.  She stated that she will do this on Monday 11-21-14 as is is out of town.  She is using tylenol for the pain prn.  Told her that if the pain increases or she notices swelling or tightness in her left hand or  fingers she needs to go to an emergency room to have that arm evaluated as there could be a blood clot.  Patient verbalized understanding. Shelly Koch stated that is is not experiencing any nausea and able to drink fluids and eat some solid food.

## 2014-11-18 NOTE — Telephone Encounter (Signed)
Medical Oncology   MD following up on problems with PICC at [facility] yesterday, as still no report in EMR from IR evaluation yesterday PM. Spoke with IR who paged PA who had been at procedure yesterday; no answer back in ~ 20 min so MD again called IR, spoke with tech who had seen patient at IR yesterday. Per tech, the purple hub flushed with no difficulty when tried at IR, this the line that would not flush per [facility] infusion notes. IR did not try to flush red hub. PA spoke with patient re pain in that arm, requested patient call back to IR today to let them know how she is. As of time of my second conversation with IR now, they have not had contact back with patient.  MD spoke with [facility] RN now, who will also try to follow up with patient by phone.  Godfrey Pick, MD

## 2014-11-20 ENCOUNTER — Other Ambulatory Visit: Payer: Self-pay | Admitting: Oncology

## 2014-11-21 ENCOUNTER — Encounter: Payer: Self-pay | Admitting: Oncology

## 2014-11-21 ENCOUNTER — Ambulatory Visit (HOSPITAL_BASED_OUTPATIENT_CLINIC_OR_DEPARTMENT_OTHER): Payer: Medicaid Other

## 2014-11-21 ENCOUNTER — Ambulatory Visit (HOSPITAL_BASED_OUTPATIENT_CLINIC_OR_DEPARTMENT_OTHER): Payer: Medicaid Other | Admitting: Oncology

## 2014-11-21 VITALS — BP 122/90 | HR 83 | Temp 97.9°F | Resp 18 | Ht <= 58 in | Wt 91.9 lb

## 2014-11-21 DIAGNOSIS — O010 Classical hydatidiform mole: Secondary | ICD-10-CM

## 2014-11-21 DIAGNOSIS — O019 Hydatidiform mole, unspecified: Secondary | ICD-10-CM

## 2014-11-21 DIAGNOSIS — Z452 Encounter for adjustment and management of vascular access device: Secondary | ICD-10-CM

## 2014-11-21 DIAGNOSIS — R636 Underweight: Secondary | ICD-10-CM

## 2014-11-21 DIAGNOSIS — E349 Endocrine disorder, unspecified: Secondary | ICD-10-CM | POA: Diagnosis not present

## 2014-11-21 DIAGNOSIS — Z79899 Other long term (current) drug therapy: Secondary | ICD-10-CM

## 2014-11-21 LAB — CBC WITH DIFFERENTIAL/PLATELET
BASO%: 0.2 % (ref 0.0–2.0)
Basophils Absolute: 0 10*3/uL (ref 0.0–0.1)
EOS ABS: 0.3 10*3/uL (ref 0.0–0.5)
EOS%: 3.5 % (ref 0.0–7.0)
HCT: 31.9 % — ABNORMAL LOW (ref 34.8–46.6)
HEMOGLOBIN: 10.8 g/dL — AB (ref 11.6–15.9)
LYMPH#: 3.8 10*3/uL — AB (ref 0.9–3.3)
LYMPH%: 44.4 % (ref 14.0–49.7)
MCH: 31.4 pg (ref 25.1–34.0)
MCHC: 33.9 g/dL (ref 31.5–36.0)
MCV: 92.7 fL (ref 79.5–101.0)
MONO#: 0.4 10*3/uL (ref 0.1–0.9)
MONO%: 4.7 % (ref 0.0–14.0)
NEUT#: 4 10*3/uL (ref 1.5–6.5)
NEUT%: 47.2 % (ref 38.4–76.8)
NRBC: 0 % (ref 0–0)
PLATELETS: 100 10*3/uL — AB (ref 145–400)
RBC: 3.44 10*6/uL — ABNORMAL LOW (ref 3.70–5.45)
RDW: 13.3 % (ref 11.2–14.5)
WBC: 8.5 10*3/uL (ref 3.9–10.3)

## 2014-11-21 LAB — COMPREHENSIVE METABOLIC PANEL (CC13)
ALBUMIN: 4 g/dL (ref 3.5–5.0)
ALK PHOS: 53 U/L (ref 40–150)
ALT: 51 U/L (ref 0–55)
AST: 37 U/L — AB (ref 5–34)
Anion Gap: 10 mEq/L (ref 3–11)
BUN: 8.2 mg/dL (ref 7.0–26.0)
CHLORIDE: 104 meq/L (ref 98–109)
CO2: 23 mEq/L (ref 22–29)
CREATININE: 0.6 mg/dL (ref 0.6–1.1)
Calcium: 8.6 mg/dL (ref 8.4–10.4)
EGFR: >90 mL/min/{1.73_m2} (ref >90)
Glucose: 131 mg/dl (ref 70–140)
Potassium: 4 mEq/L (ref 3.5–5.1)
Sodium: 137 mEq/L (ref 136–145)
Total Bilirubin: 0.33 mg/dL (ref 0.20–1.20)
Total Protein: 7 g/dL (ref 6.4–8.3)

## 2014-11-21 MED ORDER — SODIUM CHLORIDE 0.9 % IJ SOLN
10.0000 mL | INTRAMUSCULAR | Status: DC | PRN
  Administered 2014-11-21: 10 mL via INTRAVENOUS
  Filled 2014-11-21: qty 10

## 2014-11-21 MED ORDER — HEPARIN SOD (PORK) LOCK FLUSH 100 UNIT/ML IV SOLN
500.0000 [IU] | Freq: Once | INTRAVENOUS | Status: AC
  Administered 2014-11-21: 500 [IU] via INTRAVENOUS
  Filled 2014-11-21: qty 5

## 2014-11-21 NOTE — Patient Instructions (Signed)
PICC Home Guide A peripherally inserted central catheter (PICC) is a long, thin, flexible tube that is inserted into a vein in the upper arm. It is a form of intravenous (IV) access. It is considered to be a "central" line because the tip of the PICC ends in a large vein in your chest. This large vein is called the superior vena cava (SVC). The PICC tip ends in the SVC because there is a lot of blood flow in the SVC. This allows medicines and IV fluids to be quickly distributed throughout the body. The PICC is inserted using a sterile technique by a specially trained nurse or physician. After the PICC is inserted, a chest X-ray exam is done to be sure it is in the correct place.  A PICC may be placed for different reasons, such as:  To give medicines and liquid nutrition that can only be given through a central line. Examples are:  Certain antibiotic treatments.  Chemotherapy.  Total parenteral nutrition (TPN).  To take frequent blood samples.  To give IV fluids and blood products.  If there is difficulty placing a peripheral intravenous (PIV) catheter. If taken care of properly, a PICC can remain in place for several months. A PICC can also allow a person to go home from the hospital early. Medicine and PICC care can be managed at home by a family member or home health care team. WHAT PROBLEMS CAN HAPPEN WHEN I HAVE A PICC? Problems with a PICC can occasionally occur. These may include the following:  A blood clot (thrombus) forming in or at the tip of the PICC. This can cause the PICC to become clogged. A clot-dissolving medicine called tissue plasminogen activator (tPA) can be given through the PICC to help break up the clot.  Inflammation of the vein (phlebitis) in which the PICC is placed. Signs of inflammation may include redness, pain at the insertion site, red streaks, or being able to feel a "cord" in the vein where the PICC is located.  Infection in the PICC or at the insertion  site. Signs of infection may include fever, chills, redness, swelling, or pus drainage from the PICC insertion site.  PICC movement (malposition). The PICC tip may move from its original position due to excessive physical activity, forceful coughing, sneezing, or vomiting.  A break or cut in the PICC. It is important to not use scissors near the PICC.  Nerve or tendon irritation or injury during PICC insertion. WHAT SHOULD I KEEP IN MIND ABOUT ACTIVITIES WHEN I HAVE A PICC?  You may bend your arm and move it freely. If your PICC is near or at the bend of your elbow, avoid activity with repeated motion at the elbow.  Rest at home for the remainder of the day following PICC line insertion.  Avoid lifting heavy objects as instructed by your health care provider.  Avoid using a crutch with the arm on the same side as your PICC. You may need to use a walker. WHAT SHOULD I KNOW ABOUT MY PICC DRESSING?  Keep your PICC bandage (dressing) clean and dry to prevent infection.  Ask your health care provider when you may shower. Ask your health care provider to teach you how to wrap the PICC when you do take a shower.  Change the PICC dressing as instructed by your health care provider.  Change your PICC dressing if it becomes loose or wet. WHAT SHOULD I KNOW ABOUT PICC CARE?  Check the PICC insertion site   daily for leakage, redness, swelling, or pain.  Do not take a bath, swim, or use hot tubs when you have a PICC. Cover PICC line with clear plastic wrap and tape to keep it dry while showering.  Flush the PICC as directed by your health care provider. Let your health care provider know right away if the PICC is difficult to flush or does not flush. Do not use force to flush the PICC.  Do not use a syringe that is less than 10 mL to flush the PICC.  Never pull or tug on the PICC.  Avoid blood pressure checks on the arm with the PICC.  Keep your PICC identification card with you at all  times.  Do not take the PICC out yourself. Only a trained clinical professional should remove the PICC. SEEK IMMEDIATE MEDICAL CARE IF:  Your PICC is accidentally pulled all the way out. If this happens, cover the insertion site with a bandage or gauze dressing. Do not throw the PICC away. Your health care provider will need to inspect it.  Your PICC was tugged or pulled and has partially come out. Do not  push the PICC back in.  There is any type of drainage, redness, or swelling where the PICC enters the skin.  You cannot flush the PICC, it is difficult to flush, or the PICC leaks around the insertion site when it is flushed.  You hear a "flushing" sound when the PICC is flushed.  You have pain, discomfort, or numbness in your arm, shoulder, or jaw on the same side as the PICC.  You feel your heart "racing" or skipping beats.  You notice a hole or tear in the PICC.  You develop chills or a fever. MAKE SURE YOU:   Understand these instructions.  Will watch your condition.  Will get help right away if you are not doing well or get worse. Document Released: 01/12/2003 Document Revised: 11/22/2013 Document Reviewed: 03/15/2013 ExitCare Patient Information 2015 [employer]. This information is not intended to replace advice given to you by your health care provider. Make sure you discuss any questions you have with your health care provider.  

## 2014-11-21 NOTE — Progress Notes (Signed)
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 Due to inadequate response to initial weekly MTX x 7, given  from 09-20-14 thru 10-31-14, treatment was changed to Actinomycin D, first treatment 11-14-14. First Actinomycin D was complicated by near syncoopal episode after leaving office, for which she required ED interventions, and initial nausea/ vomiting which improved with addition of EMEND + decadron on 11-15-14 and IVF on 4-28. She had PICC placed prior to first Actinomycin D on 11-14-14, had not had much or anything to eat, and was using bus transportation when she had the near syncopal episode.  One port of the double lumen PICC did not flush at [facility] on 4-28, but flushed easily when evaluated subsequently by IR. Patient complained of LUE pain during that time, without swelling or other findings.   Patient is feeling fairly well today. She has had only low grade nausea, no further vomiting, and is able to eat and to drink liquids tho "everything tastes too sweet". She is working out transportation for twice weekly PICC flushes, is to meet with SW when she comes for flush on 11-23-14. She is not able to hold 60 month old baby due to the PICC and as the baby is very active. Pain in LUE side of PICC has nearly resolved, no swelling or erythema with that previously.   PICC placed by IR 11-14-14.  ONCOLOGIC HISTORY Patient had twin pregnancy in 2015, with loss of one of the twins at [redacted] weeks gestation and delivery of full term female on 06-05-14, pathology of placenta with no hydatidiform change. 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 She was seen for lower abdominal pain and vaginal bleeding in ED 07-04-14, with quantitative hCG 389 and pelvic US showing thickened, heterogeneous endometrium with internal blood flow,  suspicious for retained products of conception. She was seen by Dr Ihor Dow, with quantitative HCG 1374 on 07-26-14 and 1922 on 07-28-14. [place] 08-02-14 had 1 cm area in central uterus suspicious for retained products of conception, with no gestational sac and 3.3 cm hemorrhagic cyst on left ovary. She had D & C hysteroscopy by Dr Ihor Dow on 08-10-14, uterus 6 week size and very small amount of tissue. Pathology [phone]) was consistent with placental site reaction and retained products of conception. Quantitative hCG 08-25-14 was 4856 and repeat on 09-14-14 was 6736. CT CAP 09-09-14 had no findings of concern, with a few tiny sub-cm pulmonary nodules on left thought likely post-inflammatory. She was seen by Dr Denman George on 09-14-14, with CT findings reassuring and methotrexate recommended due to further rise in hCG. She had Implanon placed RUE 06-2014 for contraception. TSH on 08-25-14 was 2.176. First methotrexate was 09-20-2014, given weekly x 7 thru 10-31-14. HCG initially dropped, then plateau by week 7. Patient insisted on removal of Implanon on 11-10-14, was started on depoProvera by Dr Ihor Dow then. Restaging scans head CAP 11-10-14 showed no progressive or metastatic disease. Treatment was changed to Actinomycin D beginning 11-14-14.  HCG results: 07-04-14 389 07-26-2014 1374 07-28-2014 1922  D& C 08-10-14 08-25-14 4856 09-14-14 6736 09-20-14 4975 First MTX 09-25-14 4136 09-27-14 3385 Second MTX 10-04-14 2394. Third MTX 10-11-14 2378 Fourth MTX 10-17-14 1928.8 Fifth MTX 10-24-2014 2287 Sixth MTX 10-31-14 2004.7 Seventh MTX 11-11-14 2693.3 11-14-14  3275  First Actinomycin D    Review of  systems as above, also: No fever or symptoms of infection. No complaints of HA now. Bowels ok. No bleeding. No SOB or other respiratory symptoms.No pain now LLQ Remainder of 10 point Review of Systems negative.  Objective:  Vital signs in last 24 hours:  BP 122/90 mmHg  Pulse 83   Temp(Src) 97.9 F (36.6 C) (Oral)  Resp 18  Ht 4\' 10"  (1.473 m)  Wt 91 lb 14.4 oz (41.686 kg)  BMI 19.21 kg/m2 Weight stable Alert, oriented and appropriate. Ambulatory without difficulty.  No alopecia  HEENT:PERRL, sclerae not icteric. Oral mucosa moist without lesions, posterior pharynx clear.  Neck supple. No JVD.  Lymphatics:no cervical,supraclavicular, axillary or inguinal adenopathy Resp: clear to auscultation bilaterally and normal percussion bilaterally Cardio: regular rate and rhythm. No gallop. GI: soft, nontender including LLQ, not distended, no mass or organomegaly. Normally active bowel sounds. Surgical incision not remarkable. Musculoskeletal/ Extremities: without pitting edema, cords, tenderness. Can elevate LUE above 90 degrees.  Neuro: no peripheral neuropathy. Otherwise nonfocal Skin without rash, ecchymosis, petechiae PICC LUE site unremarkable, dressing intact, no swelling of arm, no tenderness to palpation.   Lab Results:  Results for orders placed or performed in visit on 11/21/14  CBC with Differential  Result Value Ref Range   WBC 8.5 3.9 - 10.3 10e3/uL   NEUT# 4.0 1.5 - 6.5 10e3/uL   HGB 10.8 (L) 11.6 - 15.9 g/dL   HCT 31.9 (L) 34.8 - 46.6 %   Platelets 100 (L) 145 - 400 10e3/uL   MCV 92.7 79.5 - 101.0 fL   MCH 31.4 25.1 - 34.0 pg   MCHC 33.9 31.5 - 36.0 g/dL   RBC 3.44 (L) 3.70 - 5.45 10e6/uL   RDW 13.3 11.2 - 14.5 %   lymph# 3.8 (H) 0.9 - 3.3 10e3/uL   MONO# 0.4 0.1 - 0.9 10e3/uL   Eosinophils Absolute 0.3 0.0 - 0.5 10e3/uL   Basophils Absolute 0.0 0.0 - 0.1 10e3/uL   NEUT% 47.2 38.4 - 76.8 %   LYMPH% 44.4 14.0 - 49.7 %   MONO% 4.7 0.0 - 14.0 %   EOS% 3.5 0.0 - 7.0 %   BASO% 0.2 0.0 - 2.0 %   nRBC 0 0 - 0 %   CMET available after visit normal with exception of AST 37, including K 4.0, glu 131, creat 0.6, other LFTs  HCG available after visit down to 1813.3  Studies/Results:  No results found.  Medications: I have reviewed the patient's  current medications. Will add EMEND and decadron on days of actinomycin D treatment. She has compazine and ativan available.  DISCUSSION: I have explained that PAC would not need twice weekly flushes, but would leave her with permanent scar on anterior chest; she prefers to continue with PICC and those flushes.  We have discussed increase in HCG marker when MTX not improving GTN and during the short break around her trip out of state and the restaging scans. We will let her know results of HCG as noted. I will see her back with cycle 2 actinomycin D on 11-28-14. Will have IVF available during next few days after treatment (patient to let us know which day(s) she can get transportation).    Assessment/Plan: 1.GTN with rising hCG 3.5 months from full term pregnancy/ loss of one twin at 10 weeks, in otherwise healthy 24 yo lady. Had 7 cycles weekly MTX, marker plateau'd and restaging scans without evidence of metastatic disease. Actinomycin D 1.25 mg/m2 begun 11-14-14, and will be given  once every other week, following HCG levels.HCG improved today as noted. WIll add EMEND and decadron to chemo and have additional IVF later in week after treatment. 2.PICC in, needs flush q Mon Thurs. Both ports functioning now. 3.Contraception necessary for a full year after completing GTN treatment, during ongoing monitoring of HCG marker then. Patient insisted that Nexplanon be removed on 11-10-14, given depoProvera that day, due next depoProvera in 3 months (12 weeks = July 14) 4.sub-cm scattered pulmonary nodules left lung not thought likely related to GTN at time of last imaging and stable on restaging scans 11-10-14. 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 Pyelonephritis 2014, resolved with oral antibiotics  7.likely environmental allergies: improved 8.underweight due to inadequate caloric  intake: improving 9. Social issues: she and boyfriend have just moved out of home with extended family, have 73 mo old daughter. Child care and transportation are concerns, [place] SW involved (thank you).   All questions answered and patient is comfortable with plans. I will see her day of Rx 5-9. Chemo orders adjusted. Needs IVF apts. Time spent 30 min including >50% counseling and coordination of care.    LIVESAY,LENNIS P, MD   11/21/2014, 3:51 PM

## 2014-11-22 ENCOUNTER — Telehealth: Payer: Self-pay

## 2014-11-22 LAB — HCG, QUANTITATIVE, PREGNANCY: hCG, Beta Chain, Quant, S: 1813.3 m[IU]/mL

## 2014-11-22 NOTE — Telephone Encounter (Signed)
-----   Message from Gordy Levan, MD sent at 11/22/2014  1:05 PM EDT ----- Labs seen and need follow up: please let her know by phone or when she comes for flush on 5-5 that marker was down to 1813 after first actinomycinD

## 2014-11-22 NOTE — Telephone Encounter (Signed)
Told Shelly Koch the results of the marker as noted below by Dr. Marko Plume. Pt. Surprised and pleased Inquired about the "burning feeling in her throat/esophagust.  She stated it was a little better.  She purchased OTC omeprazole 20 mg.  Suggested that she take it daily while on the treatments as it is time released and can take a few days to be effective.  Shelly Koch verbalized understanding.

## 2014-11-23 ENCOUNTER — Telehealth: Payer: Self-pay | Admitting: Oncology

## 2014-11-23 DIAGNOSIS — Z309 Encounter for contraceptive management, unspecified: Secondary | ICD-10-CM | POA: Insufficient documentation

## 2014-11-23 DIAGNOSIS — Z79899 Other long term (current) drug therapy: Secondary | ICD-10-CM | POA: Insufficient documentation

## 2014-11-23 NOTE — Telephone Encounter (Signed)
Appointments made per pof and patient will get a new schedule at 5/5 flush

## 2014-11-24 ENCOUNTER — Ambulatory Visit (HOSPITAL_BASED_OUTPATIENT_CLINIC_OR_DEPARTMENT_OTHER): Payer: Medicaid Other

## 2014-11-24 VITALS — BP 115/70 | HR 84 | Temp 97.8°F | Resp 18

## 2014-11-24 DIAGNOSIS — O010 Classical hydatidiform mole: Secondary | ICD-10-CM

## 2014-11-24 DIAGNOSIS — Z95828 Presence of other vascular implants and grafts: Secondary | ICD-10-CM

## 2014-11-24 DIAGNOSIS — Z452 Encounter for adjustment and management of vascular access device: Secondary | ICD-10-CM | POA: Diagnosis not present

## 2014-11-24 MED ORDER — SODIUM CHLORIDE 0.9 % IJ SOLN
10.0000 mL | INTRAMUSCULAR | Status: DC | PRN
  Administered 2014-11-24: 20 mL via INTRAVENOUS
  Filled 2014-11-24: qty 10

## 2014-11-24 MED ORDER — HEPARIN SOD (PORK) LOCK FLUSH 100 UNIT/ML IV SOLN
500.0000 [IU] | Freq: Once | INTRAVENOUS | Status: AC
  Administered 2014-11-24: 500 [IU] via INTRAVENOUS
  Filled 2014-11-24: qty 5

## 2014-11-24 NOTE — Patient Instructions (Signed)
PICC Home Guide A peripherally inserted central catheter (PICC) is a long, thin, flexible tube that is inserted into a vein in the upper arm. It is a form of intravenous (IV) access. It is considered to be a "central" line because the tip of the PICC ends in a large vein in your chest. This large vein is called the superior vena cava (SVC). The PICC tip ends in the SVC because there is a lot of blood flow in the SVC. This allows medicines and IV fluids to be quickly distributed throughout the body. The PICC is inserted using a sterile technique by a specially trained nurse or physician. After the PICC is inserted, a chest X-ray exam is done to be sure it is in the correct place.  A PICC may be placed for different reasons, such as:  To give medicines and liquid nutrition that can only be given through a central line. Examples are:  Certain antibiotic treatments.  Chemotherapy.  Total parenteral nutrition (TPN).  To take frequent blood samples.  To give IV fluids and blood products.  If there is difficulty placing a peripheral intravenous (PIV) catheter. If taken care of properly, a PICC can remain in place for several months. A PICC can also allow a person to go home from the hospital early. Medicine and PICC care can be managed at home by a family member or home health care team. WHAT PROBLEMS CAN HAPPEN WHEN I HAVE A PICC? Problems with a PICC can occasionally occur. These may include the following:  A blood clot (thrombus) forming in or at the tip of the PICC. This can cause the PICC to become clogged. A clot-dissolving medicine called tissue plasminogen activator (tPA) can be given through the PICC to help break up the clot.  Inflammation of the vein (phlebitis) in which the PICC is placed. Signs of inflammation may include redness, pain at the insertion site, red streaks, or being able to feel a "cord" in the vein where the PICC is located.  Infection in the PICC or at the insertion  site. Signs of infection may include fever, chills, redness, swelling, or pus drainage from the PICC insertion site.  PICC movement (malposition). The PICC tip may move from its original position due to excessive physical activity, forceful coughing, sneezing, or vomiting.  A break or cut in the PICC. It is important to not use scissors near the PICC.  Nerve or tendon irritation or injury during PICC insertion. WHAT SHOULD I KEEP IN MIND ABOUT ACTIVITIES WHEN I HAVE A PICC?  You may bend your arm and move it freely. If your PICC is near or at the bend of your elbow, avoid activity with repeated motion at the elbow.  Rest at home for the remainder of the day following PICC line insertion.  Avoid lifting heavy objects as instructed by your health care provider.  Avoid using a crutch with the arm on the same side as your PICC. You may need to use a walker. WHAT SHOULD I KNOW ABOUT MY PICC DRESSING?  Keep your PICC bandage (dressing) clean and dry to prevent infection.  Ask your health care provider when you may shower. Ask your health care provider to teach you how to wrap the PICC when you do take a shower.  Change the PICC dressing as instructed by your health care provider.  Change your PICC dressing if it becomes loose or wet. WHAT SHOULD I KNOW ABOUT PICC CARE?  Check the PICC insertion site   daily for leakage, redness, swelling, or pain.  Do not take a bath, swim, or use hot tubs when you have a PICC. Cover PICC line with clear plastic wrap and tape to keep it dry while showering.  Flush the PICC as directed by your health care provider. Let your health care provider know right away if the PICC is difficult to flush or does not flush. Do not use force to flush the PICC.  Do not use a syringe that is less than 10 mL to flush the PICC.  Never pull or tug on the PICC.  Avoid blood pressure checks on the arm with the PICC.  Keep your PICC identification card with you at all  times.  Do not take the PICC out yourself. Only a trained clinical professional should remove the PICC. SEEK IMMEDIATE MEDICAL CARE IF:  Your PICC is accidentally pulled all the way out. If this happens, cover the insertion site with a bandage or gauze dressing. Do not throw the PICC away. Your health care provider will need to inspect it.  Your PICC was tugged or pulled and has partially come out. Do not  push the PICC back in.  There is any type of drainage, redness, or swelling where the PICC enters the skin.  You cannot flush the PICC, it is difficult to flush, or the PICC leaks around the insertion site when it is flushed.  You hear a "flushing" sound when the PICC is flushed.  You have pain, discomfort, or numbness in your arm, shoulder, or jaw on the same side as the PICC.  You feel your heart "racing" or skipping beats.  You notice a hole or tear in the PICC.  You develop chills or a fever. MAKE SURE YOU:   Understand these instructions.  Will watch your condition.  Will get help right away if you are not doing well or get worse. Document Released: 01/12/2003 Document Revised: 11/22/2013 Document Reviewed: 03/15/2013 ExitCare Patient Information 2015 [employer]. This information is not intended to replace advice given to you by your health care provider. Make sure you discuss any questions you have with your health care provider.  

## 2014-11-25 ENCOUNTER — Encounter: Payer: Self-pay | Admitting: Family Medicine

## 2014-11-25 ENCOUNTER — Ambulatory Visit (INDEPENDENT_AMBULATORY_CARE_PROVIDER_SITE_OTHER): Payer: Medicaid Other | Admitting: Family Medicine

## 2014-11-25 VITALS — BP 110/64 | HR 84 | Wt 92.4 lb

## 2014-11-25 DIAGNOSIS — K5900 Constipation, unspecified: Secondary | ICD-10-CM

## 2014-11-25 DIAGNOSIS — O019 Hydatidiform mole, unspecified: Secondary | ICD-10-CM

## 2014-11-25 DIAGNOSIS — O010 Classical hydatidiform mole: Secondary | ICD-10-CM | POA: Diagnosis not present

## 2014-11-25 MED ORDER — LINACLOTIDE 145 MCG PO CAPS: 145.0000 ug | ORAL_CAPSULE | Freq: Every day | ORAL | Status: DC

## 2014-11-25 NOTE — Progress Notes (Signed)
   Subjective:    Patient ID: Shelly Koch, female    DOB: 1990/08/13, 24 y.o.   MRN: [id number]  HPI She is here for evaluation of constipation. 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 Presently she is taking MiraLAX 17 g twice per day as well as Senokot. She states that she has to strain to have a BM.   Review of Systems     Objective:   Physical Exam Alert and in no distress.       Assessment & Plan:  Gestational trophoblastic neoplasm  Constipation, unspecified constipation type - Plan: Linaclotide (LINZESS) 145 MCG CAPS capsule She is to continue with the MiraLAX and Senokot and I will add Linzess to the regimen. Cautioned her about difficulty with medication and recommend she call if she has any side effects.She is to call next week to let us know how she is doing.

## 2014-11-27 ENCOUNTER — Other Ambulatory Visit: Payer: Self-pay | Admitting: Oncology

## 2014-11-28 ENCOUNTER — Ambulatory Visit: Payer: Medicaid Other

## 2014-11-28 ENCOUNTER — Ambulatory Visit (HOSPITAL_BASED_OUTPATIENT_CLINIC_OR_DEPARTMENT_OTHER): Payer: Medicaid Other | Admitting: Oncology

## 2014-11-28 ENCOUNTER — Other Ambulatory Visit (HOSPITAL_BASED_OUTPATIENT_CLINIC_OR_DEPARTMENT_OTHER): Payer: Medicaid Other

## 2014-11-28 ENCOUNTER — Encounter: Payer: Self-pay | Admitting: Oncology

## 2014-11-28 ENCOUNTER — Ambulatory Visit (HOSPITAL_BASED_OUTPATIENT_CLINIC_OR_DEPARTMENT_OTHER): Payer: Medicaid Other

## 2014-11-28 VITALS — BP 123/80 | HR 96 | Temp 97.8°F | Resp 18 | Ht <= 58 in | Wt 93.0 lb

## 2014-11-28 DIAGNOSIS — O010 Classical hydatidiform mole: Secondary | ICD-10-CM

## 2014-11-28 DIAGNOSIS — O019 Hydatidiform mole, unspecified: Secondary | ICD-10-CM

## 2014-11-28 DIAGNOSIS — Z5111 Encounter for antineoplastic chemotherapy: Secondary | ICD-10-CM

## 2014-11-28 DIAGNOSIS — E349 Endocrine disorder, unspecified: Secondary | ICD-10-CM | POA: Diagnosis not present

## 2014-11-28 DIAGNOSIS — Z452 Encounter for adjustment and management of vascular access device: Secondary | ICD-10-CM

## 2014-11-28 LAB — CBC WITH DIFFERENTIAL/PLATELET
BASO%: 0.3 % (ref 0.0–2.0)
BASOS ABS: 0 10*3/uL (ref 0.0–0.1)
EOS ABS: 0.2 10*3/uL (ref 0.0–0.5)
EOS%: 2.6 % (ref 0.0–7.0)
HCT: 31.9 % — ABNORMAL LOW (ref 34.8–46.6)
HEMOGLOBIN: 10.5 g/dL — AB (ref 11.6–15.9)
LYMPH#: 2.2 10*3/uL (ref 0.9–3.3)
LYMPH%: 28.7 % (ref 14.0–49.7)
MCH: 31.4 pg (ref 25.1–34.0)
MCHC: 32.9 g/dL (ref 31.5–36.0)
MCV: 95.5 fL (ref 79.5–101.0)
MONO#: 0.7 10*3/uL (ref 0.1–0.9)
MONO%: 8.9 % (ref 0.0–14.0)
NEUT%: 59.5 % (ref 38.4–76.8)
NEUTROS ABS: 4.5 10*3/uL (ref 1.5–6.5)
Platelets: 165 10*3/uL (ref 145–400)
RBC: 3.34 10*6/uL — ABNORMAL LOW (ref 3.70–5.45)
RDW: 13.2 % (ref 11.2–14.5)
WBC: 7.6 10*3/uL (ref 3.9–10.3)

## 2014-11-28 LAB — COMPREHENSIVE METABOLIC PANEL (CC13)
ALBUMIN: 4 g/dL (ref 3.5–5.0)
ALT: 29 U/L (ref 0–55)
AST: 15 U/L (ref 5–34)
Alkaline Phosphatase: 55 U/L (ref 40–150)
Anion Gap: 10 mEq/L (ref 3–11)
BUN: 6.4 mg/dL — ABNORMAL LOW (ref 7.0–26.0)
CHLORIDE: 110 meq/L — AB (ref 98–109)
CO2: 22 mEq/L (ref 22–29)
Calcium: 8.8 mg/dL (ref 8.4–10.4)
Creatinine: 0.6 mg/dL (ref 0.6–1.1)
EGFR: >90 mL/min/{1.73_m2} (ref >90)
Glucose: 144 mg/dl — ABNORMAL HIGH (ref 70–140)
POTASSIUM: 3.9 meq/L (ref 3.5–5.1)
Sodium: 141 mEq/L (ref 136–145)
TOTAL PROTEIN: 7 g/dL (ref 6.4–8.3)
Total Bilirubin: 0.66 mg/dL (ref 0.20–1.20)

## 2014-11-28 MED ORDER — PROCHLORPERAZINE EDISYLATE 5 MG/ML IJ SOLN
INTRAMUSCULAR | Status: AC
  Filled 2014-11-28: qty 2

## 2014-11-28 MED ORDER — HEPARIN SOD (PORK) LOCK FLUSH 100 UNIT/ML IV SOLN
500.0000 [IU] | Freq: Once | INTRAVENOUS | Status: AC
  Administered 2014-11-28: 250 [IU] via INTRAVENOUS
  Filled 2014-11-28: qty 5

## 2014-11-28 MED ORDER — SODIUM CHLORIDE 0.9 % IV SOLN
Freq: Once | INTRAVENOUS | Status: AC
  Administered 2014-11-28: 13:00:00 via INTRAVENOUS
  Filled 2014-11-28: qty 5

## 2014-11-28 MED ORDER — FAMOTIDINE IN NACL 20-0.9 MG/50ML-% IV SOLN
20.0000 mg | Freq: Two times a day (BID) | INTRAVENOUS | Status: DC
  Administered 2014-11-28: 20 mg via INTRAVENOUS

## 2014-11-28 MED ORDER — HEPARIN SOD (PORK) LOCK FLUSH 100 UNIT/ML IV SOLN
250.0000 [IU] | Freq: Once | INTRAVENOUS | Status: AC | PRN
  Administered 2014-11-28: 250 [IU]
  Filled 2014-11-28: qty 5

## 2014-11-28 MED ORDER — PROCHLORPERAZINE EDISYLATE 5 MG/ML IJ SOLN
10.0000 mg | Freq: Once | INTRAMUSCULAR | Status: AC
  Administered 2014-11-28: 10 mg via INTRAVENOUS

## 2014-11-28 MED ORDER — FAMOTIDINE IN NACL 20-0.9 MG/50ML-% IV SOLN
INTRAVENOUS | Status: AC
  Filled 2014-11-28: qty 50

## 2014-11-28 MED ORDER — SODIUM CHLORIDE 0.9 % IV SOLN
Freq: Once | INTRAVENOUS | Status: AC
  Administered 2014-11-28: 13:00:00 via INTRAVENOUS

## 2014-11-28 MED ORDER — SODIUM CHLORIDE 0.9 % IJ SOLN
10.0000 mL | INTRAMUSCULAR | Status: DC | PRN
  Administered 2014-11-28: 10 mL via INTRAVENOUS
  Filled 2014-11-28: qty 10

## 2014-11-28 MED ORDER — SODIUM CHLORIDE 0.9 % IV SOLN
1.2500 mg/m2 | Freq: Once | INTRAVENOUS | Status: AC
Start: 2014-11-28 — End: 2014-11-28
  Administered 2014-11-28: 1650 ug via INTRAVENOUS
  Filled 2014-11-28: qty 3.3

## 2014-11-28 MED ORDER — SODIUM CHLORIDE 0.9 % IJ SOLN
3.0000 mL | INTRAMUSCULAR | Status: DC | PRN
  Administered 2014-11-28: 3 mL via INTRAVENOUS
  Filled 2014-11-28: qty 10

## 2014-11-28 NOTE — Progress Notes (Signed)
Per Dr Marko Plume ok to draw labs from PICC line and change dressing making pt late for MD appt.

## 2014-11-28 NOTE — Patient Instructions (Signed)
PICC Home Guide A peripherally inserted central catheter (PICC) is a long, thin, flexible tube that is inserted into a vein in the upper arm. It is a form of intravenous (IV) access. It is considered to be a "central" line because the tip of the PICC ends in a large vein in your chest. This large vein is called the superior vena cava (SVC). The PICC tip ends in the SVC because there is a lot of blood flow in the SVC. This allows medicines and IV fluids to be quickly distributed throughout the body. The PICC is inserted using a sterile technique by a specially trained nurse or physician. After the PICC is inserted, a chest X-ray exam is done to be sure it is in the correct place.  A PICC may be placed for different reasons, such as:  To give medicines and liquid nutrition that can only be given through a central line. Examples are:  Certain antibiotic treatments.  Chemotherapy.  Total parenteral nutrition (TPN).  To take frequent blood samples.  To give IV fluids and blood products.  If there is difficulty placing a peripheral intravenous (PIV) catheter. If taken care of properly, a PICC can remain in place for several months. A PICC can also allow a person to go home from the hospital early. Medicine and PICC care can be managed at home by a family member or home health care team. WHAT PROBLEMS CAN HAPPEN WHEN I HAVE A PICC? Problems with a PICC can occasionally occur. These may include the following:  A blood clot (thrombus) forming in or at the tip of the PICC. This can cause the PICC to become clogged. A clot-dissolving medicine called tissue plasminogen activator (tPA) can be given through the PICC to help break up the clot.  Inflammation of the vein (phlebitis) in which the PICC is placed. Signs of inflammation may include redness, pain at the insertion site, red streaks, or being able to feel a "cord" in the vein where the PICC is located.  Infection in the PICC or at the insertion  site. Signs of infection may include fever, chills, redness, swelling, or pus drainage from the PICC insertion site.  PICC movement (malposition). The PICC tip may move from its original position due to excessive physical activity, forceful coughing, sneezing, or vomiting.  A break or cut in the PICC. It is important to not use scissors near the PICC.  Nerve or tendon irritation or injury during PICC insertion. WHAT SHOULD I KEEP IN MIND ABOUT ACTIVITIES WHEN I HAVE A PICC?  You may bend your arm and move it freely. If your PICC is near or at the bend of your elbow, avoid activity with repeated motion at the elbow.  Rest at home for the remainder of the day following PICC line insertion.  Avoid lifting heavy objects as instructed by your health care provider.  Avoid using a crutch with the arm on the same side as your PICC. You may need to use a walker. WHAT SHOULD I KNOW ABOUT MY PICC DRESSING?  Keep your PICC bandage (dressing) clean and dry to prevent infection.  Ask your health care provider when you may shower. Ask your health care provider to teach you how to wrap the PICC when you do take a shower.  Change the PICC dressing as instructed by your health care provider.  Change your PICC dressing if it becomes loose or wet. WHAT SHOULD I KNOW ABOUT PICC CARE?  Check the PICC insertion site   daily for leakage, redness, swelling, or pain.  Do not take a bath, swim, or use hot tubs when you have a PICC. Cover PICC line with clear plastic wrap and tape to keep it dry while showering.  Flush the PICC as directed by your health care provider. Let your health care provider know right away if the PICC is difficult to flush or does not flush. Do not use force to flush the PICC.  Do not use a syringe that is less than 10 mL to flush the PICC.  Never pull or tug on the PICC.  Avoid blood pressure checks on the arm with the PICC.  Keep your PICC identification card with you at all  times.  Do not take the PICC out yourself. Only a trained clinical professional should remove the PICC. SEEK IMMEDIATE MEDICAL CARE IF:  Your PICC is accidentally pulled all the way out. If this happens, cover the insertion site with a bandage or gauze dressing. Do not throw the PICC away. Your health care provider will need to inspect it.  Your PICC was tugged or pulled and has partially come out. Do not  push the PICC back in.  There is any type of drainage, redness, or swelling where the PICC enters the skin.  You cannot flush the PICC, it is difficult to flush, or the PICC leaks around the insertion site when it is flushed.  You hear a "flushing" sound when the PICC is flushed.  You have pain, discomfort, or numbness in your arm, shoulder, or jaw on the same side as the PICC.  You feel your heart "racing" or skipping beats.  You notice a hole or tear in the PICC.  You develop chills or a fever. MAKE SURE YOU:   Understand these instructions.  Will watch your condition.  Will get help right away if you are not doing well or get worse. Document Released: 01/12/2003 Document Revised: 11/22/2013 Document Reviewed: 03/15/2013 ExitCare Patient Information 2015 [employer]. This information is not intended to replace advice given to you by your health care provider. Make sure you discuss any questions you have with your health care provider.  

## 2014-11-28 NOTE — Progress Notes (Signed)
OFFICE PROGRESS NOTE   Nov 28, 2014   Physicians:Rossi, Darlys Gales, Elyse Jarvis, MD, Lavonia Drafts  INTERVAL HISTORY:  Patient is seen, alone for visit, in continuing attention to chemotherapy for GTN, due cycle 2 actinomycin D today. She has felt well past several days, with no nausea and good po intake. Boyfriend is available for transportation and childcare for next couple of weeks.   Patient has had no problems with LUE PICC, which is flushed 2x weekly. She has been eating and drinking, tho taste is not correct. Bowels have moved better daily since addition of Linzess to present bid miralax and senokot, by Dr Redmond School on 11-25-14. She has no LLQ or other abdominal or pelvic pain now. She has had no fever, no SOB or respiratory symptoms, no bleeding. She slept well last pm.  Patient reports taking ativan x1, then generalized itching and red blotches.  PICC placed by IR 11-14-14. Contraception is depoProvera, given 11-10-14.  ONCOLOGIC HISTORY Patient had twin pregnancy in 2015, with loss of one of the twins at [redacted] weeks gestation and delivery of full term female on 06-05-14, pathology of placenta with no hydatidiform change. 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 She was seen for lower abdominal pain and vaginal bleeding in ED 07-04-14, with quantitative hCG 389 and pelvic US showing thickened, heterogeneous endometrium with internal blood flow, suspicious for retained products of conception. She was seen by Dr Ihor Dow, with quantitative HCG 1374 on 07-26-14 and 1922 on 07-28-14. [place] 08-02-14 had 1 cm area in central uterus suspicious for retained products of conception, with no gestational sac and 3.3 cm hemorrhagic cyst on left ovary. She had D & C hysteroscopy by Dr Ihor Dow on 08-10-14, uterus 6 week size and very small amount of tissue. Pathology [id number]) was consistent with placental site reaction and retained products of conception. Quantitative hCG 08-25-14 was  4856 and repeat on 09-14-14 was 6736. CT CAP 09-09-14 had no findings of concern, with a few tiny sub-cm pulmonary nodules on left thought likely post-inflammatory. She was seen by Dr Denman George on 09-14-14, with CT findings reassuring and methotrexate recommended due to further rise in hCG. She had Implanon placed RUE 06-2014 for contraception. TSH on 08-25-14 was 2.176. First methotrexate was 09-20-2014, given weekly x 7 thru 10-31-14. HCG initially dropped, then plateau by week 7. Patient insisted on removal of Implanon on 11-10-14, was started on depoProvera by Dr Ihor Dow then. Restaging scans head CAP 11-10-14 showed no progressive or metastatic disease. Treatment was changed to Actinomycin D beginning 11-14-14.  HCG results: 07-04-14 389 07-26-2014 1374 07-28-2014 1922  D& C 08-10-14 08-25-14 4856 09-14-14 6736 09-20-14 4975 First MTX 09-25-14 4136 09-27-14 3385 Second MTX 10-04-14 2394. Third MTX 10-11-14 2378 Fourth MTX 10-17-14 1928.8 Fifth MTX 10-24-2014 2287 Sixth MTX 10-31-14 2004.7 Seventh MTX 11-11-14 2693.3 11-14-14 3275 First Actinomycin D 11-21-14   1813 11-28-14 sent and pending  Review of systems as above, also: No HA, no other neurologic symptoms. No LE swelling. Bladder ok Remainder of 10 point Review of Systems negative.  Objective:  Vital signs in last 24 hours:  BP 123/80 mmHg  Pulse 96  Temp(Src) 97.8 F (36.6 C) (Oral)  Resp 18  Ht $R'4\' 10"'Ii$  (1.473 m)  Wt 93 lb (42.185 kg)  BMI 19.44 kg/m2 Weight is up 1 lb. Alert, oriented and appropriate. Ambulatory without difficulty.  No alopecia  HEENT:PERRL, sclerae not icteric. Oral mucosa moist without lesions, posterior pharynx clear.  Neck supple. No  JVD.  Lymphatics:no cervical,supraclavicular adenopathy Resp: clear to auscultation bilaterally and normal percussion bilaterally Cardio: regular rate and rhythm. No gallop. GI: soft, nontender, not distended, no mass or organomegaly. Normally active bowel  sounds.  Musculoskeletal/ Extremities: without pitting edema, cords, tenderness Neuro: nonfocal Skin without rash, ecchymosis, petechiae PICC LUE dressing intact, site not remarkable.  Lab Results:  Results for orders placed or performed in visit on 11/28/14  CBC with Differential  Result Value Ref Range   WBC 7.6 3.9 - 10.3 10e3/uL   NEUT# 4.5 1.5 - 6.5 10e3/uL   HGB 10.5 (L) 11.6 - 15.9 g/dL   HCT 31.9 (L) 34.8 - 46.6 %   Platelets 165 145 - 400 10e3/uL   MCV 95.5 79.5 - 101.0 fL   MCH 31.4 25.1 - 34.0 pg   MCHC 32.9 31.5 - 36.0 g/dL   RBC 3.34 (L) 3.70 - 5.45 10e6/uL   RDW 13.2 11.2 - 14.5 %   lymph# 2.2 0.9 - 3.3 10e3/uL   MONO# 0.7 0.1 - 0.9 10e3/uL   Eosinophils Absolute 0.2 0.0 - 0.5 10e3/uL   Basophils Absolute 0.0 0.0 - 0.1 10e3/uL   NEUT% 59.5 38.4 - 76.8 %   LYMPH% 28.7 14.0 - 49.7 %   MONO% 8.9 0.0 - 14.0 %   EOS% 2.6 0.0 - 7.0 %   BASO% 0.3 0.0 - 2.0 %  Comprehensive metabolic panel (Cmet) - [facility]  Result Value Ref Range   Sodium 141 136 - 145 mEq/L   Potassium 3.9 3.5 - 5.1 mEq/L   Chloride 110 (H) 98 - 109 mEq/L   CO2 22 22 - 29 mEq/L   Glucose 144 (H) 70 - 140 mg/dl   BUN 6.4 (L) 7.0 - 26.0 mg/dL   Creatinine 0.6 0.6 - 1.1 mg/dL   Total Bilirubin 0.66 0.20 - 1.20 mg/dL   Alkaline Phosphatase 55 40 - 150 U/L   AST 15 5 - 34 U/L   ALT 29 0 - 55 U/L   Total Protein 7.0 6.4 - 8.3 g/dL   Albumin 4.0 3.5 - 5.0 g/dL   Calcium 8.8 8.4 - 10.4 mg/dL   Anion Gap 10 3 - 11 mEq/L   EGFR >90 >90 ml/min/1.73 m2    HCG pending  Studies/Results:  No results found.  Medications: I have reviewed the patient's current medications. She is now using samples of Linzess 145 mcg capsule in addition to miralax and senokot daily, this from Dr Redmond School on 11-25-14. Reported itching and rash after ativan, which is not a usual complication.  DISCUSSION: we are pleased with drop in HCG week after first actinomycin D and will follow up results pending today. Continue to follow  HCG weekly. Will add EMEND and decadron to chemo premeds today, as these were helpful when given with IVF after cycle 1. She should have IVF at least on 12-01-14 with PICC flush that day. I have offered IVF also tomorrow, which she prefers not to have scheduled, tho she knows to call if significant nausea or vomiting prior to 5-12, as she would need IVF and IV antiemetics then if so. Note she has not tolerated zofran in past, and now reports not tolerant to ativan, so antiemetics are limited for home.  Assessment/Plan:  1.GTN following full term pregnancy/ loss of one twin at 10 weeks, in otherwise healthy 24 yo lady. Had 7 cycles weekly MTX, marker plateau'd and restaging scans without evidence of metastatic disease. Actinomycin D 1.25 mg/m2 begun 11-14-14, and will  be given once every other week, following HCG levels weekly.May need additional IVF and IV antiemetic support this week depending on symptoms, as she had significant N/V and near syncope after cycle 1. 2.PICC in for ActD, needs flush q Mon Thurs.  3.Contraception necessary for a full year after completing GTN treatment, during ongoing monitoring of HCG marker then. Patient insisted that Nexplanon be removed on 11-10-14, given depoProvera that day, due next depoProvera in 3 months (12 weeks = July 14) 4.sub-cm scattered pulmonary nodules left lung not thought likely related to GTN at time of initial imaging and stable on restaging scans 11-10-14. 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 Pyelonephritis 2014, resolved with oral antibiotics  7.likely environmental allergies: improved 8.underweight due to inadequate caloric intake: improving 9. Constipation since pregnancy: better last few days with Linzess samples from Dr Redmond School 10.Social issues: she and boyfriend recently moved with extended family, all of whom work, and have 39 mo old daughter. Child care and transportation are concerns, [facility] SW aware     Chemo orders confirmed, IVF orders available. All questions answered and patient knows to call if any concerns prior to next scheduled visit. She will see APP on 5-16 coordinating with PICC flush, with lab including quantitative HCG, chemistries, CBC; she will see MD again on 5-19 coordinating with PICC flush. She will be due cycle 3 actinomycin D on 12-12-14.  Time spent 30 min including >50% counseling and coordination of care.   Taniela Feltus P, MD   11/28/2014, 12:56 PM

## 2014-11-28 NOTE — Patient Instructions (Signed)
[facility] Discharge Instructions for Patients Receiving Chemotherapy  Today you received the following chemotherapy agents: Dactinomycin  To help prevent nausea and vomiting after your treatment, we encourage you to take your nausea medication as prescribed by your physician.    If you develop nausea and vomiting that is not controlled by your nausea medication, call the clinic.   BELOW ARE SYMPTOMS THAT SHOULD BE REPORTED IMMEDIATELY:  *FEVER GREATER THAN 100.5 F  *CHILLS WITH OR WITHOUT FEVER  NAUSEA AND VOMITING THAT IS NOT CONTROLLED WITH YOUR NAUSEA MEDICATION  *UNUSUAL SHORTNESS OF BREATH  *UNUSUAL BRUISING OR BLEEDING  TENDERNESS IN MOUTH AND THROAT WITH OR WITHOUT PRESENCE OF ULCERS  *URINARY PROBLEMS  *BOWEL PROBLEMS  UNUSUAL RASH Items with * indicate a potential emergency and should be followed up as soon as possible.  Feel free to call the clinic you have any questions or concerns. The clinic phone number is [phone].  Please show the [facility] at check-in to the Emergency Department and triage nurse.

## 2014-11-29 ENCOUNTER — Other Ambulatory Visit (HOSPITAL_BASED_OUTPATIENT_CLINIC_OR_DEPARTMENT_OTHER): Payer: Medicaid Other

## 2014-11-29 ENCOUNTER — Ambulatory Visit (HOSPITAL_BASED_OUTPATIENT_CLINIC_OR_DEPARTMENT_OTHER): Payer: Medicaid Other

## 2014-11-29 ENCOUNTER — Ambulatory Visit: Payer: Medicaid Other

## 2014-11-29 ENCOUNTER — Telehealth: Payer: Self-pay | Admitting: *Deleted

## 2014-11-29 ENCOUNTER — Encounter: Payer: Medicaid Other | Admitting: Nurse Practitioner

## 2014-11-29 VITALS — BP 122/79 | HR 103

## 2014-11-29 DIAGNOSIS — O010 Classical hydatidiform mole: Secondary | ICD-10-CM

## 2014-11-29 DIAGNOSIS — O019 Hydatidiform mole, unspecified: Secondary | ICD-10-CM

## 2014-11-29 DIAGNOSIS — R112 Nausea with vomiting, unspecified: Secondary | ICD-10-CM

## 2014-11-29 DIAGNOSIS — Z452 Encounter for adjustment and management of vascular access device: Secondary | ICD-10-CM

## 2014-11-29 DIAGNOSIS — E860 Dehydration: Secondary | ICD-10-CM

## 2014-11-29 LAB — BASIC METABOLIC PANEL (CC13)
Anion Gap: 13 mEq/L — ABNORMAL HIGH (ref 3–11)
BUN: 6.9 mg/dL — ABNORMAL LOW (ref 7.0–26.0)
CHLORIDE: 109 meq/L (ref 98–109)
CO2: 19 mEq/L — ABNORMAL LOW (ref 22–29)
Calcium: 9.3 mg/dL (ref 8.4–10.4)
Creatinine: 0.7 mg/dL (ref 0.6–1.1)
GLUCOSE: 237 mg/dL — AB (ref 70–140)
POTASSIUM: 3.7 meq/L (ref 3.5–5.1)
Sodium: 141 mEq/L (ref 136–145)

## 2014-11-29 MED ORDER — PROCHLORPERAZINE EDISYLATE 5 MG/ML IJ SOLN
10.0000 mg | Freq: Once | INTRAMUSCULAR | Status: AC | PRN
  Administered 2014-11-29: 10 mg via INTRAVENOUS

## 2014-11-29 MED ORDER — SODIUM CHLORIDE 0.9 % IV SOLN
4.0000 mg | Freq: Once | INTRAVENOUS | Status: AC
  Administered 2014-11-29: 4 mg via INTRAVENOUS
  Filled 2014-11-29: qty 0.4

## 2014-11-29 MED ORDER — HEPARIN SOD (PORK) LOCK FLUSH 100 UNIT/ML IV SOLN
500.0000 [IU] | Freq: Once | INTRAVENOUS | Status: AC
  Administered 2014-11-29: 500 [IU] via INTRAVENOUS
  Filled 2014-11-29: qty 5

## 2014-11-29 MED ORDER — SODIUM CHLORIDE 0.9 % IJ SOLN
10.0000 mL | INTRAMUSCULAR | Status: DC | PRN
  Administered 2014-11-29: 10 mL via INTRAVENOUS
  Filled 2014-11-29: qty 10

## 2014-11-29 MED ORDER — PROCHLORPERAZINE EDISYLATE 5 MG/ML IJ SOLN
INTRAMUSCULAR | Status: AC
  Filled 2014-11-29: qty 2

## 2014-11-29 MED ORDER — SODIUM CHLORIDE 0.9 % IV SOLN
4.0000 mg | Freq: Once | INTRAVENOUS | Status: DC | PRN
  Filled 2014-11-29: qty 0.4

## 2014-11-29 MED ORDER — SODIUM CHLORIDE 0.9 % IV SOLN
Freq: Once | INTRAVENOUS | Status: AC
  Administered 2014-11-29: 15:00:00 via INTRAVENOUS

## 2014-11-29 NOTE — Patient Instructions (Signed)

## 2014-11-29 NOTE — Addendum Note (Signed)
Addended by: Baruch Merl on: 11/29/2014 12:11 PM   Modules accepted: Orders

## 2014-11-29 NOTE — Telephone Encounter (Signed)
TC from patient stating she had chemo yesterday (Dactinomycin) and has been vomiting since she got home. Vomiting 6 x yesterday and at least 4 times today. Pt. Requesting IVF's. Appears to be scheduled for this on 12/01/14 but is requesting today. Home anti-emetics are not working. Pt. Has a PICC line

## 2014-11-29 NOTE — Telephone Encounter (Signed)
Reviewed symptoms noted below with Dr. Marko Plume.  Shelly Koch will come to [facility] at 1400 and get stat B-met and IVF.  She does not need to be seen by the symptom management clinic at this time.  Spoke with Shelly Dance RN and notified her of plan.  Appt for symptom management clinic cancelled. Shelly Koch  aware of appointment at 31 today with lab and IVF.

## 2014-11-29 NOTE — Patient Instructions (Signed)
PICC Home Guide A peripherally inserted central catheter (PICC) is a long, thin, flexible tube that is inserted into a vein in the upper arm. It is a form of intravenous (IV) access. It is considered to be a "central" line because the tip of the PICC ends in a large vein in your chest. This large vein is called the superior vena cava (SVC). The PICC tip ends in the SVC because there is a lot of blood flow in the SVC. This allows medicines and IV fluids to be quickly distributed throughout the body. The PICC is inserted using a sterile technique by a specially trained nurse or physician. After the PICC is inserted, a chest X-ray exam is done to be sure it is in the correct place.  A PICC may be placed for different reasons, such as:  To give medicines and liquid nutrition that can only be given through a central line. Examples are:  Certain antibiotic treatments.  Chemotherapy.  Total parenteral nutrition (TPN).  To take frequent blood samples.  To give IV fluids and blood products.  If there is difficulty placing a peripheral intravenous (PIV) catheter. If taken care of properly, a PICC can remain in place for several months. A PICC can also allow a person to go home from the hospital early. Medicine and PICC care can be managed at home by a family member or home health care team. WHAT PROBLEMS CAN HAPPEN WHEN I HAVE A PICC? Problems with a PICC can occasionally occur. These may include the following:  A blood clot (thrombus) forming in or at the tip of the PICC. This can cause the PICC to become clogged. A clot-dissolving medicine called tissue plasminogen activator (tPA) can be given through the PICC to help break up the clot.  Inflammation of the vein (phlebitis) in which the PICC is placed. Signs of inflammation may include redness, pain at the insertion site, red streaks, or being able to feel a "cord" in the vein where the PICC is located.  Infection in the PICC or at the insertion  site. Signs of infection may include fever, chills, redness, swelling, or pus drainage from the PICC insertion site.  PICC movement (malposition). The PICC tip may move from its original position due to excessive physical activity, forceful coughing, sneezing, or vomiting.  A break or cut in the PICC. It is important to not use scissors near the PICC.  Nerve or tendon irritation or injury during PICC insertion. WHAT SHOULD I KEEP IN MIND ABOUT ACTIVITIES WHEN I HAVE A PICC?  You may bend your arm and move it freely. If your PICC is near or at the bend of your elbow, avoid activity with repeated motion at the elbow.  Rest at home for the remainder of the day following PICC line insertion.  Avoid lifting heavy objects as instructed by your health care provider.  Avoid using a crutch with the arm on the same side as your PICC. You may need to use a walker. WHAT SHOULD I KNOW ABOUT MY PICC DRESSING?  Keep your PICC bandage (dressing) clean and dry to prevent infection.  Ask your health care provider when you may shower. Ask your health care provider to teach you how to wrap the PICC when you do take a shower.  Change the PICC dressing as instructed by your health care provider.  Change your PICC dressing if it becomes loose or wet. WHAT SHOULD I KNOW ABOUT PICC CARE?  Check the PICC insertion site   daily for leakage, redness, swelling, or pain.  Do not take a bath, swim, or use hot tubs when you have a PICC. Cover PICC line with clear plastic wrap and tape to keep it dry while showering.  Flush the PICC as directed by your health care provider. Let your health care provider know right away if the PICC is difficult to flush or does not flush. Do not use force to flush the PICC.  Do not use a syringe that is less than 10 mL to flush the PICC.  Never pull or tug on the PICC.  Avoid blood pressure checks on the arm with the PICC.  Keep your PICC identification card with you at all  times.  Do not take the PICC out yourself. Only a trained clinical professional should remove the PICC. SEEK IMMEDIATE MEDICAL CARE IF:  Your PICC is accidentally pulled all the way out. If this happens, cover the insertion site with a bandage or gauze dressing. Do not throw the PICC away. Your health care provider will need to inspect it.  Your PICC was tugged or pulled and has partially come out. Do not  push the PICC back in.  There is any type of drainage, redness, or swelling where the PICC enters the skin.  You cannot flush the PICC, it is difficult to flush, or the PICC leaks around the insertion site when it is flushed.  You hear a "flushing" sound when the PICC is flushed.  You have pain, discomfort, or numbness in your arm, shoulder, or jaw on the same side as the PICC.  You feel your heart "racing" or skipping beats.  You notice a hole or tear in the PICC.  You develop chills or a fever. MAKE SURE YOU:   Understand these instructions.  Will watch your condition.  Will get help right away if you are not doing well or get worse. Document Released: 01/12/2003 Document Revised: 11/22/2013 Document Reviewed: 03/15/2013 ExitCare Patient Information 2015 [employer]. This information is not intended to replace advice given to you by your health care provider. Make sure you discuss any questions you have with your health care provider.  

## 2014-11-30 ENCOUNTER — Other Ambulatory Visit: Payer: Self-pay

## 2014-11-30 DIAGNOSIS — O019 Hydatidiform mole, unspecified: Secondary | ICD-10-CM

## 2014-12-01 ENCOUNTER — Ambulatory Visit (HOSPITAL_BASED_OUTPATIENT_CLINIC_OR_DEPARTMENT_OTHER): Payer: Medicaid Other

## 2014-12-01 ENCOUNTER — Other Ambulatory Visit (HOSPITAL_BASED_OUTPATIENT_CLINIC_OR_DEPARTMENT_OTHER): Payer: Medicaid Other

## 2014-12-01 ENCOUNTER — Ambulatory Visit: Payer: Medicaid Other

## 2014-12-01 VITALS — BP 125/85 | HR 93 | Temp 98.7°F | Resp 18

## 2014-12-01 DIAGNOSIS — Z452 Encounter for adjustment and management of vascular access device: Secondary | ICD-10-CM

## 2014-12-01 DIAGNOSIS — O019 Hydatidiform mole, unspecified: Secondary | ICD-10-CM

## 2014-12-01 DIAGNOSIS — O010 Classical hydatidiform mole: Secondary | ICD-10-CM | POA: Diagnosis not present

## 2014-12-01 DIAGNOSIS — E349 Endocrine disorder, unspecified: Secondary | ICD-10-CM | POA: Diagnosis not present

## 2014-12-01 LAB — COMPREHENSIVE METABOLIC PANEL (CC13)
ALT: 21 U/L (ref 0–55)
ANION GAP: 9 meq/L (ref 3–11)
AST: 20 U/L (ref 5–34)
Albumin: 3.7 g/dL (ref 3.5–5.0)
Alkaline Phosphatase: 49 U/L (ref 40–150)
BILIRUBIN TOTAL: 0.36 mg/dL (ref 0.20–1.20)
BUN: 12.1 mg/dL (ref 7.0–26.0)
CALCIUM: 8.7 mg/dL (ref 8.4–10.4)
CHLORIDE: 107 meq/L (ref 98–109)
CO2: 22 meq/L (ref 22–29)
CREATININE: 0.6 mg/dL (ref 0.6–1.1)
GLUCOSE: 151 mg/dL — AB (ref 70–140)
Potassium: 3.6 mEq/L (ref 3.5–5.1)
Sodium: 139 mEq/L (ref 136–145)
Total Protein: 6.6 g/dL (ref 6.4–8.3)

## 2014-12-01 LAB — CBC WITH DIFFERENTIAL/PLATELET
BASO%: 0.1 % (ref 0.0–2.0)
Basophils Absolute: 0 10*3/uL (ref 0.0–0.1)
EOS ABS: 0 10*3/uL (ref 0.0–0.5)
EOS%: 0.1 % (ref 0.0–7.0)
HCT: 31.3 % — ABNORMAL LOW (ref 34.8–46.6)
HEMOGLOBIN: 10.3 g/dL — AB (ref 11.6–15.9)
LYMPH%: 59.3 % — AB (ref 14.0–49.7)
MCH: 31.5 pg (ref 25.1–34.0)
MCHC: 32.9 g/dL (ref 31.5–36.0)
MCV: 95.7 fL (ref 79.5–101.0)
MONO#: 0.8 10*3/uL (ref 0.1–0.9)
MONO%: 10.5 % (ref 0.0–14.0)
NEUT#: 2.2 10*3/uL (ref 1.5–6.5)
NEUT%: 30 % — ABNORMAL LOW (ref 38.4–76.8)
PLATELETS: 226 10*3/uL (ref 145–400)
RBC: 3.27 10*6/uL — AB (ref 3.70–5.45)
RDW: 13.4 % (ref 11.2–14.5)
WBC: 7.5 10*3/uL (ref 3.9–10.3)
lymph#: 4.4 10*3/uL — ABNORMAL HIGH (ref 0.9–3.3)

## 2014-12-01 LAB — BETA HCG QUANT (REF LAB): Beta hCG, Tumor Marker: 277.9 m[IU]/mL — ABNORMAL HIGH (ref <5.0)

## 2014-12-01 MED ORDER — PROCHLORPERAZINE EDISYLATE 5 MG/ML IJ SOLN
INTRAMUSCULAR | Status: AC
  Filled 2014-12-01: qty 2

## 2014-12-01 MED ORDER — HEPARIN SOD (PORK) LOCK FLUSH 100 UNIT/ML IV SOLN
500.0000 [IU] | Freq: Once | INTRAVENOUS | Status: AC
  Administered 2014-12-01: 500 [IU] via INTRAVENOUS
  Filled 2014-12-01: qty 5

## 2014-12-01 MED ORDER — SODIUM CHLORIDE 0.9 % IV SOLN
6.0000 mg | Freq: Once | INTRAVENOUS | Status: AC
  Administered 2014-12-01: 6 mg via INTRAVENOUS
  Filled 2014-12-01: qty 0.6

## 2014-12-01 MED ORDER — PROCHLORPERAZINE EDISYLATE 5 MG/ML IJ SOLN
10.0000 mg | Freq: Four times a day (QID) | INTRAMUSCULAR | Status: DC | PRN
  Administered 2014-12-01: 10 mg via INTRAVENOUS

## 2014-12-01 MED ORDER — SODIUM CHLORIDE 0.9 % IJ SOLN
10.0000 mL | INTRAMUSCULAR | Status: DC | PRN
  Administered 2014-12-01: 10 mL via INTRAVENOUS
  Filled 2014-12-01: qty 10

## 2014-12-01 MED ORDER — HEPARIN SOD (PORK) LOCK FLUSH 100 UNIT/ML IV SOLN
250.0000 [IU] | INTRAVENOUS | Status: AC | PRN
  Administered 2014-12-01: 250 [IU]
  Filled 2014-12-01: qty 5

## 2014-12-01 MED ORDER — SODIUM CHLORIDE 0.9 % IV SOLN
1000.0000 mL | Freq: Once | INTRAVENOUS | Status: AC
  Administered 2014-12-01: 1000 mL via INTRAVENOUS

## 2014-12-01 MED ORDER — SODIUM CHLORIDE 0.9 % IJ SOLN
10.0000 mL | INTRAMUSCULAR | Status: AC | PRN
  Administered 2014-12-01: 10 mL
  Filled 2014-12-01: qty 10

## 2014-12-01 NOTE — Patient Instructions (Signed)
PICC Home Guide A peripherally inserted central catheter (PICC) is a long, thin, flexible tube that is inserted into a vein in the upper arm. It is a form of intravenous (IV) access. It is considered to be a "central" line because the tip of the PICC ends in a large vein in your chest. This large vein is called the superior vena cava (SVC). The PICC tip ends in the SVC because there is a lot of blood flow in the SVC. This allows medicines and IV fluids to be quickly distributed throughout the body. The PICC is inserted using a sterile technique by a specially trained nurse or physician. After the PICC is inserted, a chest X-ray exam is done to be sure it is in the correct place.  A PICC may be placed for different reasons, such as:  To give medicines and liquid nutrition that can only be given through a central line. Examples are:  Certain antibiotic treatments.  Chemotherapy.  Total parenteral nutrition (TPN).  To take frequent blood samples.  To give IV fluids and blood products.  If there is difficulty placing a peripheral intravenous (PIV) catheter. If taken care of properly, a PICC can remain in place for several months. A PICC can also allow a person to go home from the hospital early. Medicine and PICC care can be managed at home by a family member or home health care team. WHAT PROBLEMS CAN HAPPEN WHEN I HAVE A PICC? Problems with a PICC can occasionally occur. These may include the following:  A blood clot (thrombus) forming in or at the tip of the PICC. This can cause the PICC to become clogged. A clot-dissolving medicine called tissue plasminogen activator (tPA) can be given through the PICC to help break up the clot.  Inflammation of the vein (phlebitis) in which the PICC is placed. Signs of inflammation may include redness, pain at the insertion site, red streaks, or being able to feel a "cord" in the vein where the PICC is located.  Infection in the PICC or at the insertion  site. Signs of infection may include fever, chills, redness, swelling, or pus drainage from the PICC insertion site.  PICC movement (malposition). The PICC tip may move from its original position due to excessive physical activity, forceful coughing, sneezing, or vomiting.  A break or cut in the PICC. It is important to not use scissors near the PICC.  Nerve or tendon irritation or injury during PICC insertion. WHAT SHOULD I KEEP IN MIND ABOUT ACTIVITIES WHEN I HAVE A PICC?  You may bend your arm and move it freely. If your PICC is near or at the bend of your elbow, avoid activity with repeated motion at the elbow.  Rest at home for the remainder of the day following PICC line insertion.  Avoid lifting heavy objects as instructed by your health care provider.  Avoid using a crutch with the arm on the same side as your PICC. You may need to use a walker. WHAT SHOULD I KNOW ABOUT MY PICC DRESSING?  Keep your PICC bandage (dressing) clean and dry to prevent infection.  Ask your health care provider when you may shower. Ask your health care provider to teach you how to wrap the PICC when you do take a shower.  Change the PICC dressing as instructed by your health care provider.  Change your PICC dressing if it becomes loose or wet. WHAT SHOULD I KNOW ABOUT PICC CARE?  Check the PICC insertion site   daily for leakage, redness, swelling, or pain.  Do not take a bath, swim, or use hot tubs when you have a PICC. Cover PICC line with clear plastic wrap and tape to keep it dry while showering.  Flush the PICC as directed by your health care provider. Let your health care provider know right away if the PICC is difficult to flush or does not flush. Do not use force to flush the PICC.  Do not use a syringe that is less than 10 mL to flush the PICC.  Never pull or tug on the PICC.  Avoid blood pressure checks on the arm with the PICC.  Keep your PICC identification card with you at all  times.  Do not take the PICC out yourself. Only a trained clinical professional should remove the PICC. SEEK IMMEDIATE MEDICAL CARE IF:  Your PICC is accidentally pulled all the way out. If this happens, cover the insertion site with a bandage or gauze dressing. Do not throw the PICC away. Your health care provider will need to inspect it.  Your PICC was tugged or pulled and has partially come out. Do not  push the PICC back in.  There is any type of drainage, redness, or swelling where the PICC enters the skin.  You cannot flush the PICC, it is difficult to flush, or the PICC leaks around the insertion site when it is flushed.  You hear a "flushing" sound when the PICC is flushed.  You have pain, discomfort, or numbness in your arm, shoulder, or jaw on the same side as the PICC.  You feel your heart "racing" or skipping beats.  You notice a hole or tear in the PICC.  You develop chills or a fever. MAKE SURE YOU:   Understand these instructions.  Will watch your condition.  Will get help right away if you are not doing well or get worse. Document Released: 01/12/2003 Document Revised: 11/22/2013 Document Reviewed: 03/15/2013 ExitCare Patient Information 2015 [employer]. This information is not intended to replace advice given to you by your health care provider. Make sure you discuss any questions you have with your health care provider.  

## 2014-12-01 NOTE — Patient Instructions (Signed)
Dehydration, Adult Dehydration is when you lose more fluids from the body than you take in. Vital organs like the kidneys, brain, and heart cannot function without a proper amount of fluids and salt. Any loss of fluids from the body can cause dehydration.  CAUSES   Vomiting.  Diarrhea.  Excessive sweating.  Excessive urine output.  Fever. SYMPTOMS  Mild dehydration  Thirst.  Dry lips.  Slightly dry mouth. Moderate dehydration  Very dry mouth.  Sunken eyes.  Skin does not bounce back quickly when lightly pinched and released.  Dark urine and decreased urine production.  Decreased tear production.  Headache. Severe dehydration  Very dry mouth.  Extreme thirst.  Rapid, weak pulse (more than 100 beats per minute at rest).  Cold hands and feet.  Not able to sweat in spite of heat and temperature.  Rapid breathing.  Blue lips.  Confusion and lethargy.  Difficulty being awakened.  Minimal urine production.  No tears. DIAGNOSIS  Your caregiver will diagnose dehydration based on your symptoms and your exam. Blood and urine tests will help confirm the diagnosis. The diagnostic evaluation should also identify the cause of dehydration. TREATMENT  Treatment of mild or moderate dehydration can often be done at home by increasing the amount of fluids that you drink. It is best to drink small amounts of fluid more often. Drinking too much at one time can make vomiting worse. Refer to the home care instructions below. Severe dehydration needs to be treated at the hospital where you will probably be given intravenous (IV) fluids that contain water and electrolytes. HOME CARE INSTRUCTIONS   Ask your caregiver about specific rehydration instructions.  Drink enough fluids to keep your urine clear or pale yellow.  Drink small amounts frequently if you have nausea and vomiting.  Eat as you normally do.  Avoid:  Foods or drinks high in sugar.  Carbonated  drinks.  Juice.  Extremely hot or cold fluids.  Drinks with caffeine.  Fatty, greasy foods.  Alcohol.  Tobacco.  Overeating.  Gelatin desserts.  Wash your hands well to avoid spreading bacteria and viruses.  Only take over-the-counter or prescription medicines for pain, discomfort, or fever as directed by your caregiver.  Ask your caregiver if you should continue all prescribed and over-the-counter medicines.  Keep all follow-up appointments with your caregiver. SEEK MEDICAL CARE IF:  You have abdominal pain and it increases or stays in one area (localizes).  You have a rash, stiff neck, or severe headache.  You are irritable, sleepy, or difficult to awaken.  You are weak, dizzy, or extremely thirsty. SEEK IMMEDIATE MEDICAL CARE IF:   You are unable to keep fluids down or you get worse despite treatment.  You have frequent episodes of vomiting or diarrhea.  You have blood or green matter (bile) in your vomit.  You have blood in your stool or your stool looks black and tarry.  You have not urinated in 6 to 8 hours, or you have only urinated a small amount of very dark urine.  You have a fever.  You faint. MAKE SURE YOU:   Understand these instructions.  Will watch your condition.  Will get help right away if you are not doing well or get worse. Document Released: 07/08/2005 Document Revised: 09/30/2011 Document Reviewed: 02/25/2011 ExitCare Patient Information 2015 [employer]. This information is not intended to replace advice given to you by your health care provider. Make sure you discuss any questions you have with your health care   provider.  

## 2014-12-01 NOTE — Progress Notes (Signed)
Pt complains of dizziness when standing. Kristen; Dr. Mariana Kaufman nurse; aware and will notify MD. Pt encouraged to push fluids, and verbalizes understanding. Vital signs stable.

## 2014-12-02 ENCOUNTER — Other Ambulatory Visit: Payer: Medicaid Other

## 2014-12-02 LAB — HCG, QUANTITATIVE, PREGNANCY: HCG, BETA CHAIN, QUANT, S: 141.4 m[IU]/mL

## 2014-12-04 IMAGING — US US UA DOPPLER RE-EVAL
1 series · 12 of 12 positions shown · non-contrast
Comparison: none

[Series 1: us ua doppler re-eval · 0.23mm/px · 12 acquisitions, 12 frames shown]
[im 1/12]
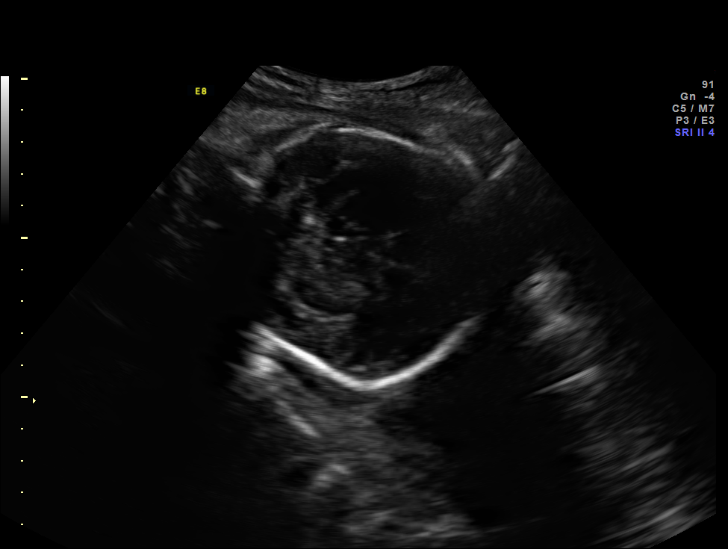
[im 2/12]
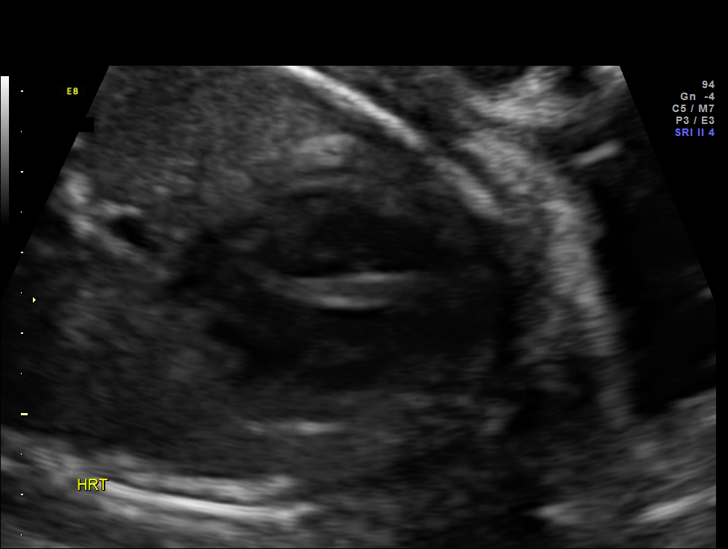
[im 3/12]
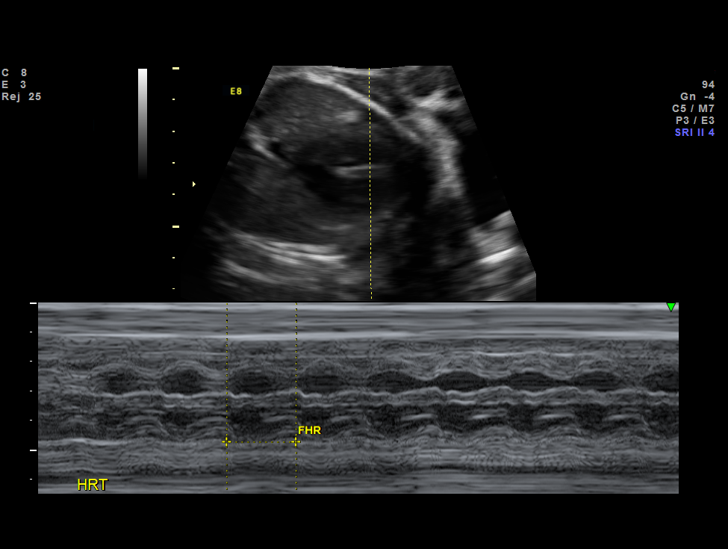
[im 4/12]
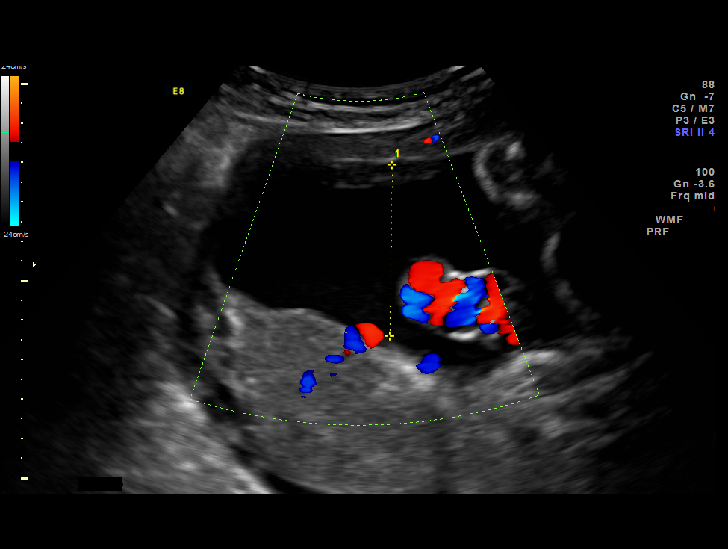
[im 5/12]
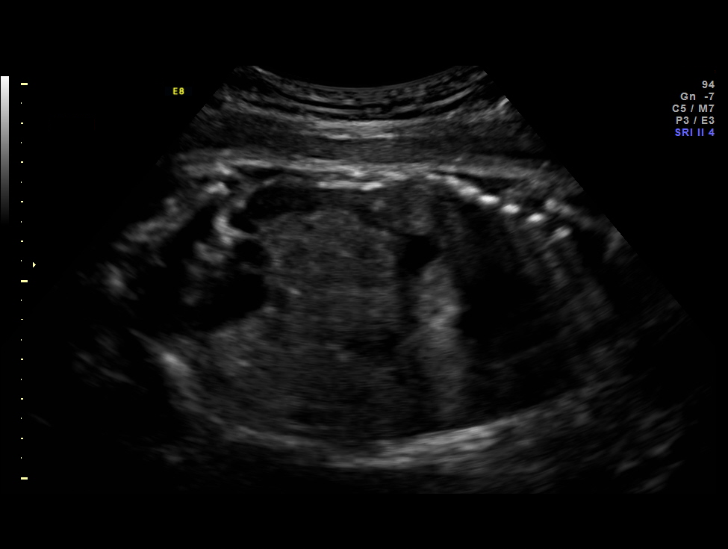
[im 6/12]
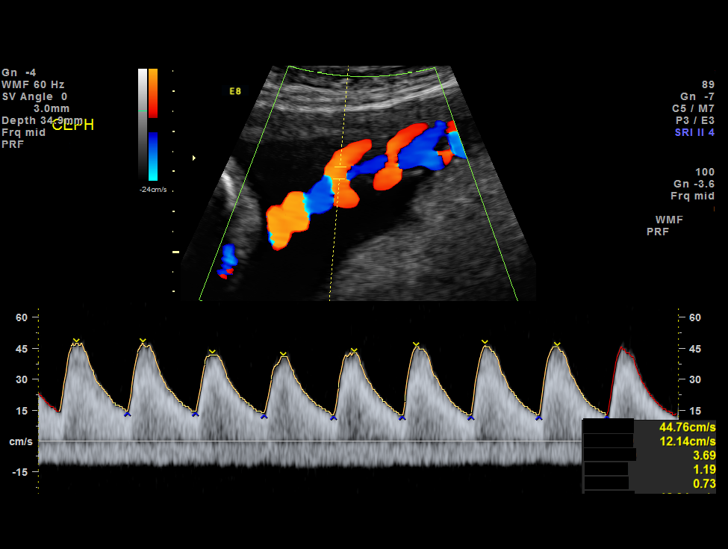
[im 7/12]
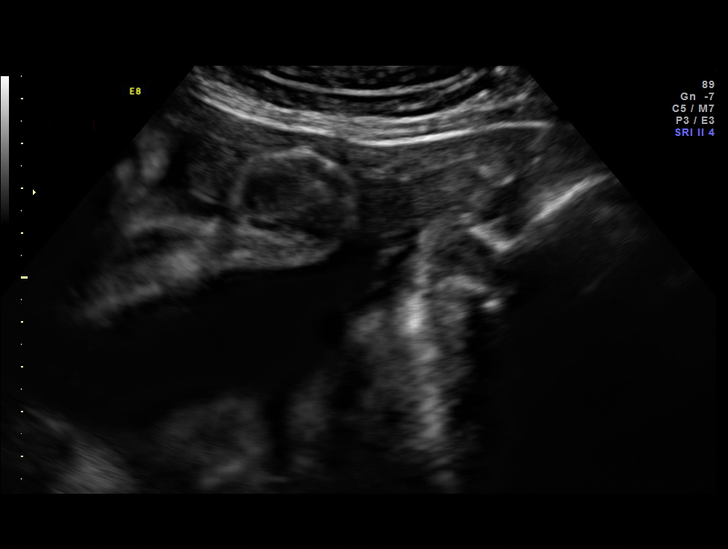
[im 8/12]
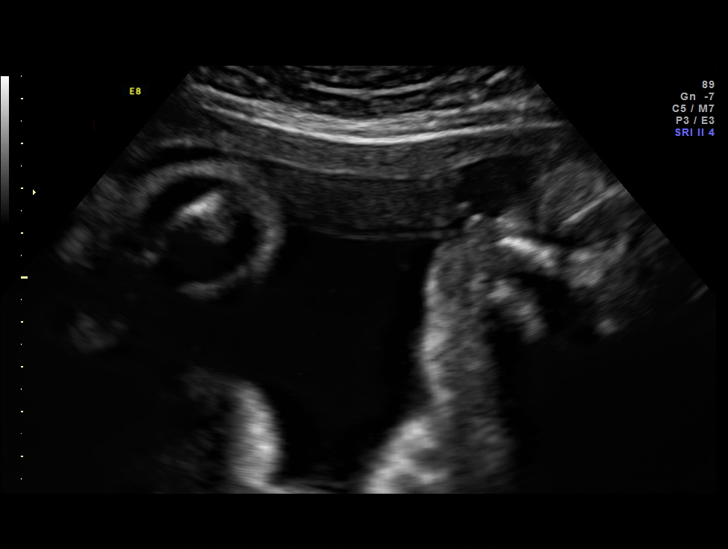
[im 9/12]
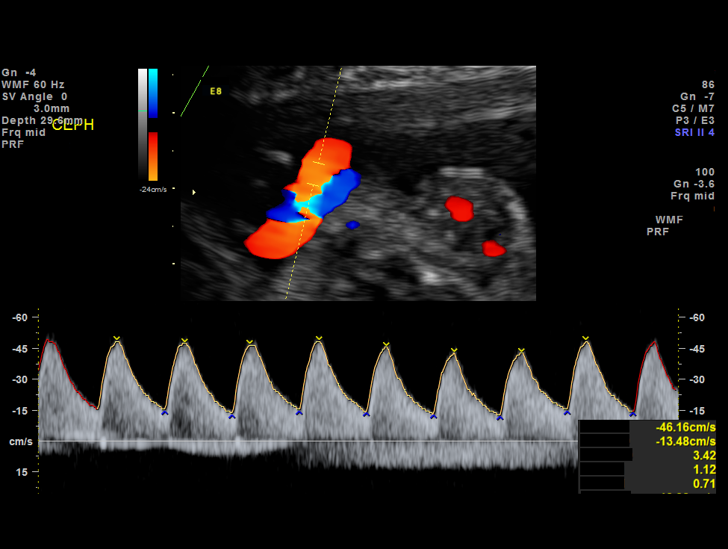
[im 10/12]
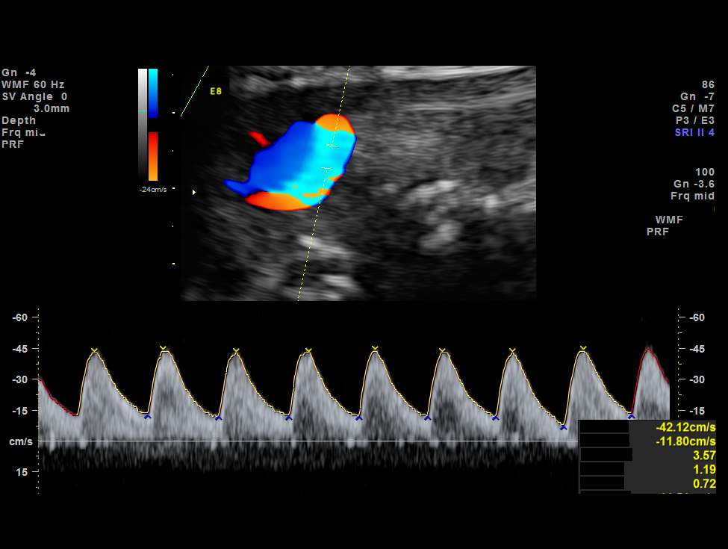
[im 11/12]
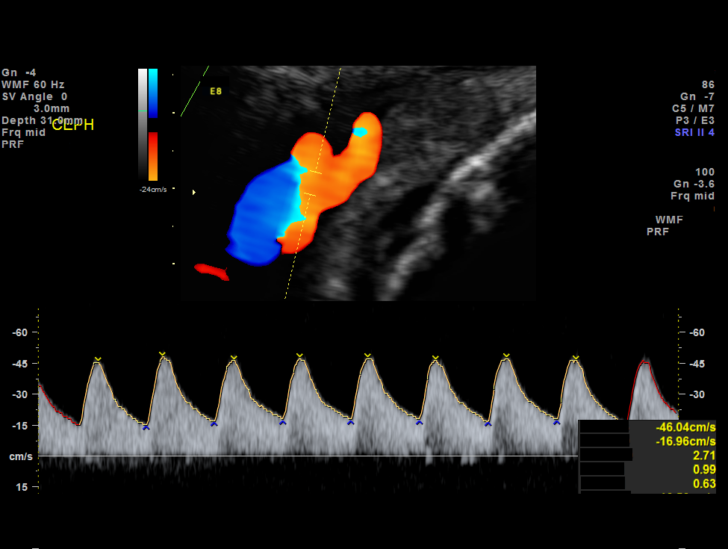
[im 12/12]
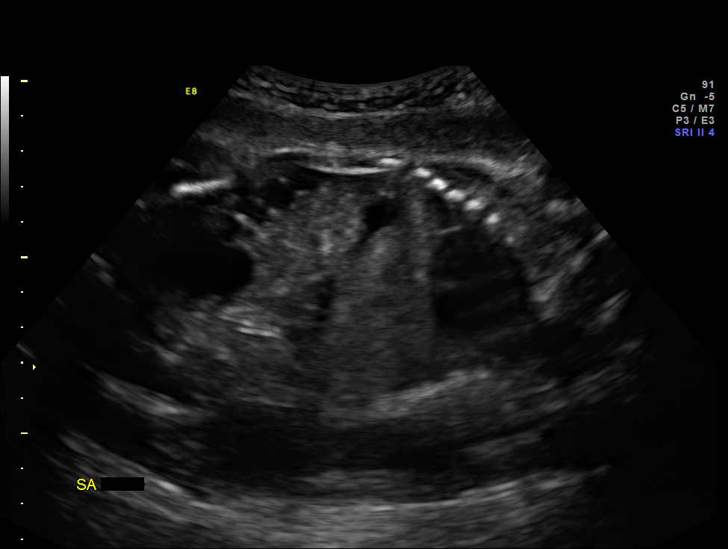

[12 of 12 positions shown; findings below may reference images not displayed]

OBSTETRICS REPORT
                      (Signed Final 05/05/2014 [DATE])

Service(s) Provided

 US UA DOPPLER RE-EVAL                                 76828.1
Indications

 Fetal abnormality - other known or suspected
 (suspected VSD)
 Twin gestation, demise of 1 fetus
 Elevated HCG (3.06 MoM), Elevated Inhibin (4.39
 MoM)
 Abnormal biochemical screen (quad) for Trisomy
 21 ([DATE]) - low risk NIPS
 Size less than dates (Small for gestational age,
 FGR)
 33 weeks gestation of pregnancy
Fetal Evaluation

 Num Of Fetuses:    1
 Fetal Heart Rate:  145                          bpm
 Cardiac Activity:  Observed
 Presentation:      Cephalic
 Placenta:          Posterior, above cervical
                    os
 P. Cord            Previously Visualized
 Insertion:

 Amniotic Fluid
 AFI FV:      Subjectively within normal limits
                                             Larg Pckt:     4.3  cm
Biophysical Evaluation

 Amniotic F.V:   Pocket => 2 cm two         F. Tone:        Observed
                 planes
 F. Movement:    Observed                   Score:          [DATE]
 F. Breathing:   Observed
Gestational Age

 LMP:           33w 2d        Date:  09/14/13                 EDD:   06/21/14
 Best:          33w 2d     Det. By:  LMP  (09/14/13)          EDD:   06/21/14
Doppler - Fetal Vessels
 Umbilical Artery
 S/D:   3.35           85  %tile       RI:
 PI:    1.12                           PSV:       46.16   cm/s
 Umbilical Artery
 Absent DFV:    No     Reverse DFV:    No

Impression

 SIUP at 77w5d
 BPP [DATE]
 AFI is gestational age appropriate
 UA Doppler is between mean and +1SD (normal
Recommendations

 Follow-up ultrasound for growth in 2 weeks
 In the interim, weekly UA Dopplers, weekly AFI, and twice
 weekly NST's are recommended.

 Thank you for sharing in the care of Ms. KATURII COVERGAL with
 questions or concerns.

## 2014-12-05 ENCOUNTER — Other Ambulatory Visit (HOSPITAL_BASED_OUTPATIENT_CLINIC_OR_DEPARTMENT_OTHER): Payer: Medicaid Other

## 2014-12-05 ENCOUNTER — Ambulatory Visit (HOSPITAL_BASED_OUTPATIENT_CLINIC_OR_DEPARTMENT_OTHER): Payer: Medicaid Other

## 2014-12-05 ENCOUNTER — Other Ambulatory Visit: Payer: Self-pay | Admitting: Oncology

## 2014-12-05 ENCOUNTER — Encounter: Payer: Self-pay | Admitting: Oncology

## 2014-12-05 ENCOUNTER — Ambulatory Visit (HOSPITAL_BASED_OUTPATIENT_CLINIC_OR_DEPARTMENT_OTHER): Payer: Medicaid Other | Admitting: Oncology

## 2014-12-05 VITALS — BP 114/67 | HR 83 | Temp 97.6°F | Resp 18 | Ht <= 58 in | Wt 93.3 lb

## 2014-12-05 DIAGNOSIS — Z452 Encounter for adjustment and management of vascular access device: Secondary | ICD-10-CM

## 2014-12-05 DIAGNOSIS — O019 Hydatidiform mole, unspecified: Secondary | ICD-10-CM

## 2014-12-05 DIAGNOSIS — J3089 Other allergic rhinitis: Secondary | ICD-10-CM

## 2014-12-05 DIAGNOSIS — O010 Classical hydatidiform mole: Secondary | ICD-10-CM

## 2014-12-05 DIAGNOSIS — E349 Endocrine disorder, unspecified: Secondary | ICD-10-CM | POA: Diagnosis not present

## 2014-12-05 LAB — COMPREHENSIVE METABOLIC PANEL (CC13)
ALBUMIN: 3.9 g/dL (ref 3.5–5.0)
ALK PHOS: 56 U/L (ref 40–150)
ALT: 43 U/L (ref 0–55)
AST: 28 U/L (ref 5–34)
Anion Gap: 11 mEq/L (ref 3–11)
BUN: 9 mg/dL (ref 7.0–26.0)
CHLORIDE: 104 meq/L (ref 98–109)
CO2: 25 mEq/L (ref 22–29)
Calcium: 8.9 mg/dL (ref 8.4–10.4)
Creatinine: 0.6 mg/dL (ref 0.6–1.1)
Glucose: 118 mg/dl (ref 70–140)
POTASSIUM: 4.1 meq/L (ref 3.5–5.1)
SODIUM: 140 meq/L (ref 136–145)
Total Bilirubin: 0.21 mg/dL (ref 0.20–1.20)
Total Protein: 7.1 g/dL (ref 6.4–8.3)

## 2014-12-05 LAB — CBC WITH DIFFERENTIAL/PLATELET
BASO%: 0.2 % (ref 0.0–2.0)
BASOS ABS: 0 10*3/uL (ref 0.0–0.1)
EOS%: 3.5 % (ref 0.0–7.0)
Eosinophils Absolute: 0.3 10*3/uL (ref 0.0–0.5)
HEMATOCRIT: 32.5 % — AB (ref 34.8–46.6)
HEMOGLOBIN: 11 g/dL — AB (ref 11.6–15.9)
LYMPH#: 4.3 10*3/uL — AB (ref 0.9–3.3)
LYMPH%: 50.1 % — ABNORMAL HIGH (ref 14.0–49.7)
MCH: 31.6 pg (ref 25.1–34.0)
MCHC: 33.8 g/dL (ref 31.5–36.0)
MCV: 93.4 fL (ref 79.5–101.0)
MONO#: 0.4 10*3/uL (ref 0.1–0.9)
MONO%: 5.1 % (ref 0.0–14.0)
NEUT#: 3.6 10*3/uL (ref 1.5–6.5)
NEUT%: 41.1 % (ref 38.4–76.8)
PLATELETS: 230 10*3/uL (ref 145–400)
RBC: 3.48 10*6/uL — ABNORMAL LOW (ref 3.70–5.45)
RDW: 13 % (ref 11.2–14.5)
WBC: 8.7 10*3/uL (ref 3.9–10.3)

## 2014-12-05 MED ORDER — SODIUM CHLORIDE 0.9 % IJ SOLN
10.0000 mL | INTRAMUSCULAR | Status: DC | PRN
  Administered 2014-12-05: 10 mL via INTRAVENOUS
  Filled 2014-12-05: qty 10

## 2014-12-05 MED ORDER — HEPARIN SOD (PORK) LOCK FLUSH 100 UNIT/ML IV SOLN
500.0000 [IU] | Freq: Once | INTRAVENOUS | Status: AC
  Administered 2014-12-05: 250 [IU] via INTRAVENOUS
  Filled 2014-12-05: qty 5

## 2014-12-05 NOTE — Patient Instructions (Signed)
PICC Home Guide A peripherally inserted central catheter (PICC) is a long, thin, flexible tube that is inserted into a vein in the upper arm. It is a form of intravenous (IV) access. It is considered to be a "central" line because the tip of the PICC ends in a large vein in your chest. This large vein is called the superior vena cava (SVC). The PICC tip ends in the SVC because there is a lot of blood flow in the SVC. This allows medicines and IV fluids to be quickly distributed throughout the body. The PICC is inserted using a sterile technique by a specially trained nurse or physician. After the PICC is inserted, a chest X-ray exam is done to be sure it is in the correct place.  A PICC may be placed for different reasons, such as:  To give medicines and liquid nutrition that can only be given through a central line. Examples are:  Certain antibiotic treatments.  Chemotherapy.  Total parenteral nutrition (TPN).  To take frequent blood samples.  To give IV fluids and blood products.  If there is difficulty placing a peripheral intravenous (PIV) catheter. If taken care of properly, a PICC can remain in place for several months. A PICC can also allow a person to go home from the hospital early. Medicine and PICC care can be managed at home by a family member or home health care team. WHAT PROBLEMS CAN HAPPEN WHEN I HAVE A PICC? Problems with a PICC can occasionally occur. These may include the following:  A blood clot (thrombus) forming in or at the tip of the PICC. This can cause the PICC to become clogged. A clot-dissolving medicine called tissue plasminogen activator (tPA) can be given through the PICC to help break up the clot.  Inflammation of the vein (phlebitis) in which the PICC is placed. Signs of inflammation may include redness, pain at the insertion site, red streaks, or being able to feel a "cord" in the vein where the PICC is located.  Infection in the PICC or at the insertion  site. Signs of infection may include fever, chills, redness, swelling, or pus drainage from the PICC insertion site.  PICC movement (malposition). The PICC tip may move from its original position due to excessive physical activity, forceful coughing, sneezing, or vomiting.  A break or cut in the PICC. It is important to not use scissors near the PICC.  Nerve or tendon irritation or injury during PICC insertion. WHAT SHOULD I KEEP IN MIND ABOUT ACTIVITIES WHEN I HAVE A PICC?  You may bend your arm and move it freely. If your PICC is near or at the bend of your elbow, avoid activity with repeated motion at the elbow.  Rest at home for the remainder of the day following PICC line insertion.  Avoid lifting heavy objects as instructed by your health care provider.  Avoid using a crutch with the arm on the same side as your PICC. You may need to use a walker. WHAT SHOULD I KNOW ABOUT MY PICC DRESSING?  Keep your PICC bandage (dressing) clean and dry to prevent infection.  Ask your health care provider when you may shower. Ask your health care provider to teach you how to wrap the PICC when you do take a shower.  Change the PICC dressing as instructed by your health care provider.  Change your PICC dressing if it becomes loose or wet. WHAT SHOULD I KNOW ABOUT PICC CARE?  Check the PICC insertion site   daily for leakage, redness, swelling, or pain.  Do not take a bath, swim, or use hot tubs when you have a PICC. Cover PICC line with clear plastic wrap and tape to keep it dry while showering.  Flush the PICC as directed by your health care provider. Let your health care provider know right away if the PICC is difficult to flush or does not flush. Do not use force to flush the PICC.  Do not use a syringe that is less than 10 mL to flush the PICC.  Never pull or tug on the PICC.  Avoid blood pressure checks on the arm with the PICC.  Keep your PICC identification card with you at all  times.  Do not take the PICC out yourself. Only a trained clinical professional should remove the PICC. SEEK IMMEDIATE MEDICAL CARE IF:  Your PICC is accidentally pulled all the way out. If this happens, cover the insertion site with a bandage or gauze dressing. Do not throw the PICC away. Your health care provider will need to inspect it.  Your PICC was tugged or pulled and has partially come out. Do not  push the PICC back in.  There is any type of drainage, redness, or swelling where the PICC enters the skin.  You cannot flush the PICC, it is difficult to flush, or the PICC leaks around the insertion site when it is flushed.  You hear a "flushing" sound when the PICC is flushed.  You have pain, discomfort, or numbness in your arm, shoulder, or jaw on the same side as the PICC.  You feel your heart "racing" or skipping beats.  You notice a hole or tear in the PICC.  You develop chills or a fever. MAKE SURE YOU:   Understand these instructions.  Will watch your condition.  Will get help right away if you are not doing well or get worse. Document Released: 01/12/2003 Document Revised: 11/22/2013 Document Reviewed: 03/15/2013 ExitCare Patient Information 2015 [employer]. This information is not intended to replace advice given to you by your health care provider. Make sure you discuss any questions you have with your health care provider.  

## 2014-12-05 NOTE — Progress Notes (Signed)
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 She has felt well past several days, with no nausea and good po intake.   Patient has had no problems with LUE PICC, which is flushed 2x weekly. She has been eating and drinking. Weight is stable. Continues to report constipation. Remains on Linzess, bid miralax,  and senokot (1 tab BID). She has no LLQ or other abdominal or pelvic pain now. She has had no fever, no SOB or respiratory symptoms, no bleeding. She slept well last pm.  PICC placed by IR 11-14-14. Contraception is depoProvera, given 11-10-14.  ONCOLOGIC HISTORY Patient had twin pregnancy in 2015, with loss of one of the twins at [redacted] weeks gestation and delivery of full term female on 06-05-14, pathology of placenta with no hydatidiform change. 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 She was seen for lower abdominal pain and vaginal bleeding in ED 07-04-14, with quantitative hCG 389 and pelvic US showing thickened, heterogeneous endometrium with internal blood flow, suspicious for retained products of conception. She was seen by Dr Ihor Dow, with quantitative HCG 1374 on 07-26-14 and 1922 on 07-28-14. [place] 08-02-14 had 1 cm area in central uterus suspicious for retained products of conception, with no gestational sac and 3.3 cm hemorrhagic cyst on left ovary. She had D & C hysteroscopy by Dr Ihor Dow on 08-10-14, uterus 6 week size and very small amount of tissue. Pathology [phone]) was consistent with placental site reaction and retained products of conception. Quantitative hCG 08-25-14 was 4856 and repeat on 09-14-14 was 6736. CT CAP 09-09-14 had no findings of concern, with a few tiny sub-cm pulmonary nodules on left thought likely post-inflammatory. She was seen by Dr Denman George on 09-14-14,  with CT findings reassuring and methotrexate recommended due to further rise in hCG. She had Implanon placed RUE 06-2014 for contraception. TSH on 08-25-14 was 2.176. First methotrexate was 09-20-2014, given weekly x 7 thru 10-31-14. HCG initially dropped, then plateau by week 7. Patient insisted on removal of Implanon on 11-10-14, was started on depoProvera by Dr Ihor Dow then. Restaging scans head CAP 11-10-14 showed no progressive or metastatic disease. Treatment was changed to Actinomycin D beginning 11-14-14.  HCG results: 07-04-14 389 07-26-2014 1374 07-28-2014 1922  D& C 08-10-14 08-25-14 4856 09-14-14 6736 09-20-14 4975 First MTX 09-25-14 4136 09-27-14 3385 Second MTX 10-04-14 2394. Third MTX 10-11-14 2378 Fourth MTX 10-17-14 1928.8 Fifth MTX 10-24-2014 2287 Sixth MTX 10-31-14 2004.7 Seventh MTX 11-11-14 2693.3 11-14-14 3275 First Actinomycin D 11-21-14   1813 11-28-14   141  Review of systems as above, also: No HA, no other neurologic symptoms. No LE swelling. Bladder ok Remainder of 10 point Review of Systems negative.  Objective:  Vital signs in last 24 hours:  BP 114/67 mmHg  Pulse 83  Temp(Src) 97.6 F (36.4 C) (Oral)  Resp 18  Ht $R'4\' 10"'mO$  (1.473 m)  Wt 93 lb 4.8 oz (42.321 kg)  BMI 19.51 kg/m2  SpO2 100% Weight is up 1 lb. Alert, oriented and appropriate. Ambulatory without difficulty.  No alopecia  HEENT:PERRL, sclerae not icteric. Oral mucosa moist without lesions, posterior pharynx clear.  Neck supple. No JVD.  Lymphatics:no cervical,supraclavicular adenopathy Resp: clear to auscultation bilaterally and normal percussion bilaterally Cardio: regular rate and rhythm. No gallop. GI: soft, nontender, not distended, no  mass or organomegaly. Normally active bowel sounds.  Musculoskeletal/ Extremities: without pitting edema, cords, tenderness Neuro: nonfocal Skin without rash, ecchymosis, petechiae PICC LUE dressing intact, site not remarkable.  Lab  Results:  Results for orders placed or performed in visit on 12/05/14  CBC with Differential  Result Value Ref Range   WBC 8.7 3.9 - 10.3 10e3/uL   NEUT# 3.6 1.5 - 6.5 10e3/uL   HGB 11.0 (L) 11.6 - 15.9 g/dL   HCT 32.5 (L) 34.8 - 46.6 %   Platelets 230 145 - 400 10e3/uL   MCV 93.4 79.5 - 101.0 fL   MCH 31.6 25.1 - 34.0 pg   MCHC 33.8 31.5 - 36.0 g/dL   RBC 3.48 (L) 3.70 - 5.45 10e6/uL   RDW 13.0 11.2 - 14.5 %   lymph# 4.3 (H) 0.9 - 3.3 10e3/uL   MONO# 0.4 0.1 - 0.9 10e3/uL   Eosinophils Absolute 0.3 0.0 - 0.5 10e3/uL   Basophils Absolute 0.0 0.0 - 0.1 10e3/uL   NEUT% 41.1 38.4 - 76.8 %   LYMPH% 50.1 (H) 14.0 - 49.7 %   MONO% 5.1 0.0 - 14.0 %   EOS% 3.5 0.0 - 7.0 %   BASO% 0.2 0.0 - 2.0 %  Comprehensive metabolic panel (Cmet) - [facility]  Result Value Ref Range   Sodium 140 136 - 145 mEq/L   Potassium 4.1 3.5 - 5.1 mEq/L   Chloride 104 98 - 109 mEq/L   CO2 25 22 - 29 mEq/L   Glucose 118 70 - 140 mg/dl   BUN 9.0 7.0 - 26.0 mg/dL   Creatinine 0.6 0.6 - 1.1 mg/dL   Total Bilirubin 0.21 0.20 - 1.20 mg/dL   Alkaline Phosphatase 56 40 - 150 U/L   AST 28 5 - 34 U/L   ALT 43 0 - 55 U/L   Total Protein 7.1 6.4 - 8.3 g/dL   Albumin 3.9 3.5 - 5.0 g/dL   Calcium 8.9 8.4 - 10.4 mg/dL   Anion Gap 11 3 - 11 mEq/L   EGFR >90 >90 ml/min/1.73 m2    HCG pending  Studies/Results:  No results found.  Medications: I have reviewed the patient's current medications. She is now using samples of Linzess 145 mcg capsule in addition to miralax and senokot daily, this from Dr Redmond School on 11-25-14. Reported itching and rash after ativan, which is not a usual complication.  DISCUSSION: She has had a significant drop in HCG drawn 12/01/14. Continue to follow HCG weekly. She is tolerating her chemo well at this point. She is eating and drinking without difficulty and her weight is stable. She does not need IVF today. Discussed that she may increase her Senokot up to 2 tabs BID.  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 Had 7 cycles weekly MTX, marker plateau'd and restaging scans without evidence of metastatic disease. Actinomycin D 1.25 mg/m2 begun 11-14-14, and will be given once every other week, following HCG levels weekly.May need additional IVF and IV antiemetic support this week depending on symptoms, as she had significant N/V and near syncope after cycle 1. 2.PICC in for ActD, needs flush q Mon Thurs.  3.Contraception necessary for a full year after completing GTN treatment, during ongoing monitoring of HCG marker then. Patient insisted that Nexplanon be removed on 11-10-14, given depoProvera that day, due next depoProvera in 3 months (12 weeks = July 14) 4.sub-cm scattered pulmonary nodules left lung  not thought likely related to GTN at time of initial imaging and stable on restaging scans 11-10-14. 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 Pyelonephritis 2014, resolved with oral antibiotics  7.likely environmental allergies: improved 8.underweight due to inadequate caloric intake: improving 9. Constipation since pregnancy: better last few days with Linzess samples from Dr Redmond School 10.Social issues: she and boyfriend recently moved with extended family, all of whom work, and have 102 mo old daughter. Child care and transportation are concerns, [place] SW aware   She will see MD again on 5-19 coordinating with PICC flush. She will be due cycle 3 actinomycin D on 12-12-14.  Time spent 20 min including >50% counseling and coordination of care.   Mikey Bussing, NP   12/05/2014, 2:28 PM

## 2014-12-06 LAB — HCG, QUANTITATIVE, PREGNANCY: hCG, Beta Chain, Quant, S: 77.9 m[IU]/mL

## 2014-12-07 ENCOUNTER — Other Ambulatory Visit: Payer: Self-pay | Admitting: Oncology

## 2014-12-08 ENCOUNTER — Other Ambulatory Visit (HOSPITAL_BASED_OUTPATIENT_CLINIC_OR_DEPARTMENT_OTHER): Payer: Medicaid Other

## 2014-12-08 ENCOUNTER — Ambulatory Visit (HOSPITAL_BASED_OUTPATIENT_CLINIC_OR_DEPARTMENT_OTHER): Payer: Medicaid Other

## 2014-12-08 ENCOUNTER — Ambulatory Visit (HOSPITAL_BASED_OUTPATIENT_CLINIC_OR_DEPARTMENT_OTHER): Payer: Medicaid Other | Admitting: Oncology

## 2014-12-08 ENCOUNTER — Telehealth: Payer: Self-pay | Admitting: *Deleted

## 2014-12-08 ENCOUNTER — Encounter: Payer: Self-pay | Admitting: Oncology

## 2014-12-08 ENCOUNTER — Telehealth: Payer: Self-pay | Admitting: Oncology

## 2014-12-08 VITALS — BP 123/72 | HR 89 | Temp 97.8°F | Resp 18 | Ht <= 58 in | Wt 94.3 lb

## 2014-12-08 DIAGNOSIS — O019 Hydatidiform mole, unspecified: Secondary | ICD-10-CM

## 2014-12-08 DIAGNOSIS — R112 Nausea with vomiting, unspecified: Secondary | ICD-10-CM

## 2014-12-08 DIAGNOSIS — T451X5A Adverse effect of antineoplastic and immunosuppressive drugs, initial encounter: Secondary | ICD-10-CM

## 2014-12-08 DIAGNOSIS — Z452 Encounter for adjustment and management of vascular access device: Secondary | ICD-10-CM

## 2014-12-08 DIAGNOSIS — O010 Classical hydatidiform mole: Secondary | ICD-10-CM

## 2014-12-08 LAB — CBC WITH DIFFERENTIAL/PLATELET
BASO%: 0.8 % (ref 0.0–2.0)
Basophils Absolute: 0.1 10*3/uL (ref 0.0–0.1)
EOS%: 1.9 % (ref 0.0–7.0)
Eosinophils Absolute: 0.1 10*3/uL (ref 0.0–0.5)
HEMATOCRIT: 31.3 % — AB (ref 34.8–46.6)
HEMOGLOBIN: 10.8 g/dL — AB (ref 11.6–15.9)
LYMPH#: 2.8 10*3/uL (ref 0.9–3.3)
LYMPH%: 40.6 % (ref 14.0–49.7)
MCH: 32.2 pg (ref 25.1–34.0)
MCHC: 34.5 g/dL (ref 31.5–36.0)
MCV: 93.5 fL (ref 79.5–101.0)
MONO#: 0.6 10*3/uL (ref 0.1–0.9)
MONO%: 9 % (ref 0.0–14.0)
NEUT#: 3.2 10*3/uL (ref 1.5–6.5)
NEUT%: 47.7 % (ref 38.4–76.8)
Platelets: 167 10*3/uL (ref 145–400)
RBC: 3.35 10*6/uL — ABNORMAL LOW (ref 3.70–5.45)
RDW: 13.7 % (ref 11.2–14.5)
WBC: 6.8 10*3/uL (ref 3.9–10.3)

## 2014-12-08 LAB — COMPREHENSIVE METABOLIC PANEL (CC13)
ALT: 34 U/L (ref 0–55)
ANION GAP: 11 meq/L (ref 3–11)
AST: 21 U/L (ref 5–34)
Albumin: 3.9 g/dL (ref 3.5–5.0)
Alkaline Phosphatase: 61 U/L (ref 40–150)
BILIRUBIN TOTAL: 0.21 mg/dL (ref 0.20–1.20)
BUN: 7.7 mg/dL (ref 7.0–26.0)
CO2: 21 mEq/L — ABNORMAL LOW (ref 22–29)
CREATININE: 0.6 mg/dL (ref 0.6–1.1)
Calcium: 8.8 mg/dL (ref 8.4–10.4)
Chloride: 107 mEq/L (ref 98–109)
EGFR: >90 mL/min/{1.73_m2} (ref >90)
Glucose: 149 mg/dl — ABNORMAL HIGH (ref 70–140)
Potassium: 3.7 mEq/L (ref 3.5–5.1)
Sodium: 139 mEq/L (ref 136–145)
Total Protein: 7 g/dL (ref 6.4–8.3)

## 2014-12-08 MED ORDER — HEPARIN SOD (PORK) LOCK FLUSH 100 UNIT/ML IV SOLN
500.0000 [IU] | Freq: Once | INTRAVENOUS | Status: AC
  Administered 2014-12-08: 250 [IU] via INTRAVENOUS
  Filled 2014-12-08: qty 5

## 2014-12-08 MED ORDER — SODIUM CHLORIDE 0.9 % IJ SOLN
10.0000 mL | INTRAMUSCULAR | Status: DC | PRN
  Administered 2014-12-08: 10 mL via INTRAVENOUS
  Filled 2014-12-08: qty 10

## 2014-12-08 MED ORDER — SODIUM CHLORIDE 0.9 % IV SOLN: INTRAVENOUS | Status: DC

## 2014-12-08 NOTE — Telephone Encounter (Signed)
Per staff message and POF I have scheduled appts. Advised scheduler of appts. JMW  

## 2014-12-08 NOTE — Patient Instructions (Signed)
PICC Home Guide A peripherally inserted central catheter (PICC) is a long, thin, flexible tube that is inserted into a vein in the upper arm. It is a form of intravenous (IV) access. It is considered to be a "central" line because the tip of the PICC ends in a large vein in your chest. This large vein is called the superior vena cava (SVC). The PICC tip ends in the SVC because there is a lot of blood flow in the SVC. This allows medicines and IV fluids to be quickly distributed throughout the body. The PICC is inserted using a sterile technique by a specially trained nurse or physician. After the PICC is inserted, a chest X-ray exam is done to be sure it is in the correct place.  A PICC may be placed for different reasons, such as:  To give medicines and liquid nutrition that can only be given through a central line. Examples are:  Certain antibiotic treatments.  Chemotherapy.  Total parenteral nutrition (TPN).  To take frequent blood samples.  To give IV fluids and blood products.  If there is difficulty placing a peripheral intravenous (PIV) catheter. If taken care of properly, a PICC can remain in place for several months. A PICC can also allow a person to go home from the hospital early. Medicine and PICC care can be managed at home by a family member or home health care team. WHAT PROBLEMS CAN HAPPEN WHEN I HAVE A PICC? Problems with a PICC can occasionally occur. These may include the following:  A blood clot (thrombus) forming in or at the tip of the PICC. This can cause the PICC to become clogged. A clot-dissolving medicine called tissue plasminogen activator (tPA) can be given through the PICC to help break up the clot.  Inflammation of the vein (phlebitis) in which the PICC is placed. Signs of inflammation may include redness, pain at the insertion site, red streaks, or being able to feel a "cord" in the vein where the PICC is located.  Infection in the PICC or at the insertion  site. Signs of infection may include fever, chills, redness, swelling, or pus drainage from the PICC insertion site.  PICC movement (malposition). The PICC tip may move from its original position due to excessive physical activity, forceful coughing, sneezing, or vomiting.  A break or cut in the PICC. It is important to not use scissors near the PICC.  Nerve or tendon irritation or injury during PICC insertion. WHAT SHOULD I KEEP IN MIND ABOUT ACTIVITIES WHEN I HAVE A PICC?  You may bend your arm and move it freely. If your PICC is near or at the bend of your elbow, avoid activity with repeated motion at the elbow.  Rest at home for the remainder of the day following PICC line insertion.  Avoid lifting heavy objects as instructed by your health care provider.  Avoid using a crutch with the arm on the same side as your PICC. You may need to use a walker. WHAT SHOULD I KNOW ABOUT MY PICC DRESSING?  Keep your PICC bandage (dressing) clean and dry to prevent infection.  Ask your health care provider when you may shower. Ask your health care provider to teach you how to wrap the PICC when you do take a shower.  Change the PICC dressing as instructed by your health care provider.  Change your PICC dressing if it becomes loose or wet. WHAT SHOULD I KNOW ABOUT PICC CARE?  Check the PICC insertion site   daily for leakage, redness, swelling, or pain.  Do not take a bath, swim, or use hot tubs when you have a PICC. Cover PICC line with clear plastic wrap and tape to keep it dry while showering.  Flush the PICC as directed by your health care provider. Let your health care provider know right away if the PICC is difficult to flush or does not flush. Do not use force to flush the PICC.  Do not use a syringe that is less than 10 mL to flush the PICC.  Never pull or tug on the PICC.  Avoid blood pressure checks on the arm with the PICC.  Keep your PICC identification card with you at all  times.  Do not take the PICC out yourself. Only a trained clinical professional should remove the PICC. SEEK IMMEDIATE MEDICAL CARE IF:  Your PICC is accidentally pulled all the way out. If this happens, cover the insertion site with a bandage or gauze dressing. Do not throw the PICC away. Your health care provider will need to inspect it.  Your PICC was tugged or pulled and has partially come out. Do not  push the PICC back in.  There is any type of drainage, redness, or swelling where the PICC enters the skin.  You cannot flush the PICC, it is difficult to flush, or the PICC leaks around the insertion site when it is flushed.  You hear a "flushing" sound when the PICC is flushed.  You have pain, discomfort, or numbness in your arm, shoulder, or jaw on the same side as the PICC.  You feel your heart "racing" or skipping beats.  You notice a hole or tear in the PICC.  You develop chills or a fever. MAKE SURE YOU:   Understand these instructions.  Will watch your condition.  Will get help right away if you are not doing well or get worse. Document Released: 01/12/2003 Document Revised: 11/22/2013 Document Reviewed: 03/15/2013 ExitCare Patient Information 2015 [employer]. This information is not intended to replace advice given to you by your health care provider. Make sure you discuss any questions you have with your health care provider.  

## 2014-12-08 NOTE — Telephone Encounter (Signed)
Gave avs & calendar for May/June. Sent message to schedule treatment. °

## 2014-12-08 NOTE — Progress Notes (Signed)
OFFICE PROGRESS NOTE   Dec 08, 2014   Physicians:Rossi, Burnetta Sabin, MD, Lavonia Drafts  INTERVAL HISTORY:  Patient is seen, alone for visit, in continuing attention to chemotherapy in progress for GTN, with cycle 2 actinomycin D given on 11-28-14. EMEND day of treatment and IVF on days 2 and 4 with this cycle was helpful, tho she still had nausea during first week. PICC has functioned without difficulty, flushed at [facility] on Mondays and Thursdays.   Patient used compazine every 6 hours for first several days after chemo. She was able to keep down some po's. Taste is altered. She has had clear rhinorrhea and slight symptoms of viral URI, without fever or lower respiratory symptoms, for past 4-5 days; baby has same symptoms. Bowels are moving daily with senokot S 2 bid, miralax once daily and Linzess once daily. She has had no bleeding. She has slight HA today with the URI symptoms (CT head 11-09-14 negative).   PICC placed by IR 11-14-14. Contraception is depoProvera, given 11-10-14.  ONCOLOGIC HISTORY Patient had twin pregnancy in 2015, with loss of one of the twins at [redacted] weeks gestation and delivery of full term female on 06-05-14, pathology of placenta with no hydatidiform change. 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 She was seen for lower abdominal pain and vaginal bleeding in ED 07-04-14, with quantitative hCG 389 and pelvic US showing thickened, heterogeneous endometrium with internal blood flow, suspicious for retained products of conception. She was seen by Dr Ihor Dow, with quantitative HCG 1374 on 07-26-14 and 1922 on 07-28-14. [place] 08-02-14 had 1 cm area in central uterus suspicious for retained products of conception, with no gestational sac and 3.3 cm hemorrhagic cyst on left ovary. She had D & C hysteroscopy by Dr Ihor Dow on 08-10-14, uterus 6 week size and very small amount of tissue. Pathology [phone]) was consistent with placental site reaction  and retained products of conception. Quantitative hCG 08-25-14 was 4856 and repeat on 09-14-14 was 6736. CT CAP 09-09-14 had no findings of concern, with a few tiny sub-cm pulmonary nodules on left thought likely post-inflammatory. She was seen by Dr Denman George on 09-14-14, with CT findings reassuring and methotrexate recommended due to further rise in hCG. She had Implanon placed RUE 06-2014 for contraception. TSH on 08-25-14 was 2.176. First methotrexate was 09-20-2014, given weekly x 7 thru 10-31-14. HCG initially dropped, then plateau by week 7. Patient insisted on removal of Implanon on 11-10-14, was started on depoProvera by Dr Ihor Dow then. Restaging scans head CAP 11-10-14 showed no progressive or metastatic disease. Treatment was changed to Actinomycin D beginning 11-14-14.  HCG results: 07-04-14 389 07-26-2014 1374 07-28-2014 1922  D& C 08-10-14 08-25-14 4856 09-14-14 6736 09-20-14 4975 First MTX 09-25-14 4136 09-27-14 3385 Second MTX 10-04-14 2394. Third MTX 10-11-14 2378 Fourth MTX 10-17-14 1928.8 Fifth MTX 10-24-2014 2287 Sixth MTX 10-31-14 2004.7 Seventh MTX 11-11-14 2693.3 11-14-14 3275 First Actinomycin D 11-21-14 1813 11-28-14    277.9          Second Actinomycin D 12-01-14   141 12-05-14   77.9  Review of systems as above, also: No pain. No LE swelling. No SOB. Minimal cough with URI symptoms, NP. Bladder ok Remainder of 10 point Review of Systems negative.  Objective:  Vital signs in last 24 hours:  BP 123/72 mmHg  Pulse 89  Temp(Src) 97.8 F (36.6 C) (Oral)  Resp 18  Ht $R'4\' 10"'WM$  (1.473 m)  Wt 94 lb 4.8 oz (  42.774 kg)  BMI 19.71 kg/m2  SpO2 100%  Alert, oriented and appropriate. Ambulatory without assistance difficulty.  Alopecia  HEENT:PERRL, sclerae not icteric. Oral mucosa moist without lesions, posterior pharynx clear. Nasal turbinates not boggy. Neck supple. No JVD.  Lymphatics:no cervical,supraclavicular adenopathy Resp: clear to auscultation  bilaterally and normal percussion bilaterally Cardio: regular rate and rhythm. No gallop. GI: soft, nontender, not distended, no mass or organomegaly. Normally active bowel sounds.  Musculoskeletal/ Extremities: without pitting edema, cords, tenderness Neuro: no peripheral neuropathy. Otherwise nonfocal. PSYCH appropriate mood and affect Skin without rash, ecchymosis, petechiae PICC insertion site unremarkable, dressing intact, no swelling or tenderness LUE  Lab Results:  Results for orders placed or performed in visit on 12/08/14  CBC with Differential  Result Value Ref Range   WBC 6.8 3.9 - 10.3 10e3/uL   NEUT# 3.2 1.5 - 6.5 10e3/uL   HGB 10.8 (L) 11.6 - 15.9 g/dL   HCT 31.3 (L) 34.8 - 46.6 %   Platelets 167 145 - 400 10e3/uL   MCV 93.5 79.5 - 101.0 fL   MCH 32.2 25.1 - 34.0 pg   MCHC 34.5 31.5 - 36.0 g/dL   RBC 3.35 (L) 3.70 - 5.45 10e6/uL   RDW 13.7 11.2 - 14.5 %   lymph# 2.8 0.9 - 3.3 10e3/uL   MONO# 0.6 0.1 - 0.9 10e3/uL   Eosinophils Absolute 0.1 0.0 - 0.5 10e3/uL   Basophils Absolute 0.1 0.0 - 0.1 10e3/uL   NEUT% 47.7 38.4 - 76.8 %   LYMPH% 40.6 14.0 - 49.7 %   MONO% 9.0 0.0 - 14.0 %   EOS% 1.9 0.0 - 7.0 %   BASO% 0.8 0.0 - 2.0 %  Comprehensive metabolic panel (Cmet) - [facility]  Result Value Ref Range   Sodium 139 136 - 145 mEq/L   Potassium 3.7 3.5 - 5.1 mEq/L   Chloride 107 98 - 109 mEq/L   CO2 21 (L) 22 - 29 mEq/L   Glucose 149 (H) 70 - 140 mg/dl   BUN 7.7 7.0 - 26.0 mg/dL   Creatinine 0.6 0.6 - 1.1 mg/dL   Total Bilirubin 0.21 0.20 - 1.20 mg/dL   Alkaline Phosphatase 61 40 - 150 U/L   AST 21 5 - 34 U/L   ALT 34 0 - 55 U/L   Total Protein 7.0 6.4 - 8.3 g/dL   Albumin 3.9 3.5 - 5.0 g/dL   Calcium 8.8 8.4 - 10.4 mg/dL   Anion Gap 11 3 - 11 mEq/L   EGFR >90 >90 ml/min/1.73 m2     Studies/Results:  No results found.  Medications: I have reviewed the patient's current medications. She is taking miralax bid, senokot and linzess. Note intolerant to zofran and  ativan  DISCUSSION: HCG results as above. Patient is in agreement with continuing treatment, including IVF days 2 and 4 with next treatment. Situation is easier at home as boyfriend is on break for next couple of weeks and is taking care of the baby.  Assessment/Plan:  1.GTN following full term pregnancy/ loss of one twin at 10 weeks, in otherwise healthy 24 yo lady. Had 7 cycles weekly MTX, marker plateau'd and restaging scans without evidence of metastatic disease. Actinomycin D 1.25 mg/m2 begun 11-14-14, and will be given once every other week, following HCG levels weekly. Additional IVF and IV antiemetic support helpful 2.PICC in for ActD, needs flush q Mon Thurs.  3.Contraception necessary for a full year after completing GTN treatment, during ongoing monitoring of HCG marker then.  Patient insisted that Nexplanon be removed on 11-10-14, given depoProvera that day, due next depoProvera in 3 months (12 weeks = July 14) 4.sub-cm scattered pulmonary nodules left lung not thought likely related to GTN at time of initial imaging and stable on restaging scans 11-10-14. 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 Pyelonephritis 2014, resolved with oral antibiotics  7.likely environmental allergies: improved 8.underweight due to inadequate caloric intake: improving 9. Constipation since pregnancy, worse with chemo. Bowels are moving with 3 medications regularly. 10.Social issues: Child care and transportation are more manageable for next few weeks with boyfriend able to assist. 11.viral URI: minimal symptoms, improving.   All questions answered. Patient knows that she can contact us at any time if concerns between appointments. Chemo orders confirmed, IVF orders done. Time spent 25 min including >50% counseling and coordination of care.   Gordy Levan, MD   12/08/2014, 5:33 PM

## 2014-12-09 ENCOUNTER — Telehealth: Payer: Self-pay | Admitting: Oncology

## 2014-12-09 ENCOUNTER — Telehealth: Payer: Self-pay | Admitting: *Deleted

## 2014-12-09 NOTE — Telephone Encounter (Signed)
Left message with note below 

## 2014-12-09 NOTE — Telephone Encounter (Signed)
Confirmed appointment for 05/24

## 2014-12-09 NOTE — Telephone Encounter (Signed)
-----   Message from Gordy Levan, MD sent at 12/08/2014  5:37 PM EDT ----- Please let her know that we did not repeat the HCG on 5-19, as this had been done 5-16 and was down to 77 then. Will check it again day of chemo 5-23  (I mistakenly told her it was drawn again on 5-19 and that we would call her with result)

## 2014-12-12 ENCOUNTER — Telehealth: Payer: Self-pay | Admitting: *Deleted

## 2014-12-12 ENCOUNTER — Ambulatory Visit: Payer: Medicaid Other

## 2014-12-12 ENCOUNTER — Ambulatory Visit (HOSPITAL_BASED_OUTPATIENT_CLINIC_OR_DEPARTMENT_OTHER): Payer: Medicaid Other | Admitting: Oncology

## 2014-12-12 ENCOUNTER — Telehealth: Payer: Self-pay | Admitting: Oncology

## 2014-12-12 ENCOUNTER — Encounter: Payer: Self-pay | Admitting: Oncology

## 2014-12-12 ENCOUNTER — Ambulatory Visit (HOSPITAL_BASED_OUTPATIENT_CLINIC_OR_DEPARTMENT_OTHER): Payer: Medicaid Other

## 2014-12-12 ENCOUNTER — Other Ambulatory Visit (HOSPITAL_BASED_OUTPATIENT_CLINIC_OR_DEPARTMENT_OTHER): Payer: Medicaid Other

## 2014-12-12 VITALS — BP 127/83 | HR 87 | Temp 97.3°F | Resp 18 | Ht <= 58 in | Wt 93.1 lb

## 2014-12-12 DIAGNOSIS — O019 Hydatidiform mole, unspecified: Secondary | ICD-10-CM

## 2014-12-12 DIAGNOSIS — R112 Nausea with vomiting, unspecified: Secondary | ICD-10-CM

## 2014-12-12 DIAGNOSIS — Z452 Encounter for adjustment and management of vascular access device: Secondary | ICD-10-CM

## 2014-12-12 DIAGNOSIS — O010 Classical hydatidiform mole: Secondary | ICD-10-CM

## 2014-12-12 DIAGNOSIS — Z5111 Encounter for antineoplastic chemotherapy: Secondary | ICD-10-CM | POA: Diagnosis not present

## 2014-12-12 DIAGNOSIS — R1032 Left lower quadrant pain: Secondary | ICD-10-CM | POA: Diagnosis not present

## 2014-12-12 DIAGNOSIS — T451X5A Adverse effect of antineoplastic and immunosuppressive drugs, initial encounter: Secondary | ICD-10-CM

## 2014-12-12 DIAGNOSIS — K5900 Constipation, unspecified: Secondary | ICD-10-CM

## 2014-12-12 LAB — CBC WITH DIFFERENTIAL/PLATELET
BASO%: 0.3 % (ref 0.0–2.0)
BASOS ABS: 0 10*3/uL (ref 0.0–0.1)
EOS ABS: 0.2 10*3/uL (ref 0.0–0.5)
EOS%: 2.5 % (ref 0.0–7.0)
HCT: 33 % — ABNORMAL LOW (ref 34.8–46.6)
HGB: 10.8 g/dL — ABNORMAL LOW (ref 11.6–15.9)
LYMPH#: 2.2 10*3/uL (ref 0.9–3.3)
LYMPH%: 35.3 % (ref 14.0–49.7)
MCH: 31.3 pg (ref 25.1–34.0)
MCHC: 32.7 g/dL (ref 31.5–36.0)
MCV: 95.7 fL (ref 79.5–101.0)
MONO#: 0.7 10*3/uL (ref 0.1–0.9)
MONO%: 10.7 % (ref 0.0–14.0)
NEUT%: 51.2 % (ref 38.4–76.8)
NEUTROS ABS: 3.2 10*3/uL (ref 1.5–6.5)
Platelets: 141 10*3/uL — ABNORMAL LOW (ref 145–400)
RBC: 3.45 10*6/uL — ABNORMAL LOW (ref 3.70–5.45)
RDW: 13.2 % (ref 11.2–14.5)
WBC: 6.3 10*3/uL (ref 3.9–10.3)

## 2014-12-12 LAB — COMPREHENSIVE METABOLIC PANEL (CC13)
ALT: 25 U/L (ref 0–55)
AST: 18 U/L (ref 5–34)
Albumin: 4 g/dL (ref 3.5–5.0)
Alkaline Phosphatase: 56 U/L (ref 40–150)
Anion Gap: 12 mEq/L — ABNORMAL HIGH (ref 3–11)
BUN: 8.4 mg/dL (ref 7.0–26.0)
CALCIUM: 9.7 mg/dL (ref 8.4–10.4)
CHLORIDE: 108 meq/L (ref 98–109)
CO2: 22 meq/L (ref 22–29)
CREATININE: 0.6 mg/dL (ref 0.6–1.1)
EGFR: >90 mL/min/{1.73_m2} (ref >90)
Glucose: 103 mg/dl (ref 70–140)
Potassium: 4.6 mEq/L (ref 3.5–5.1)
Sodium: 141 mEq/L (ref 136–145)
Total Bilirubin: 0.39 mg/dL (ref 0.20–1.20)
Total Protein: 7.1 g/dL (ref 6.4–8.3)

## 2014-12-12 MED ORDER — SODIUM CHLORIDE 0.9 % IV SOLN
Freq: Once | INTRAVENOUS | Status: AC
  Administered 2014-12-12: 12:00:00 via INTRAVENOUS
  Filled 2014-12-12: qty 5

## 2014-12-12 MED ORDER — SODIUM CHLORIDE 0.9 % IJ SOLN
10.0000 mL | INTRAMUSCULAR | Status: DC | PRN
  Administered 2014-12-12: 10 mL
  Filled 2014-12-12: qty 10

## 2014-12-12 MED ORDER — PROCHLORPERAZINE EDISYLATE 5 MG/ML IJ SOLN
INTRAMUSCULAR | Status: AC
  Filled 2014-12-12: qty 2

## 2014-12-12 MED ORDER — PROCHLORPERAZINE EDISYLATE 5 MG/ML IJ SOLN
10.0000 mg | Freq: Once | INTRAMUSCULAR | Status: AC
  Administered 2014-12-12: 10 mg via INTRAVENOUS

## 2014-12-12 MED ORDER — SODIUM CHLORIDE 0.9 % IV SOLN
Freq: Once | INTRAVENOUS | Status: AC
  Administered 2014-12-12: 12:00:00 via INTRAVENOUS

## 2014-12-12 MED ORDER — HEPARIN SOD (PORK) LOCK FLUSH 100 UNIT/ML IV SOLN
500.0000 [IU] | Freq: Once | INTRAVENOUS | Status: AC
  Administered 2014-12-12: 250 [IU] via INTRAVENOUS
  Filled 2014-12-12: qty 5

## 2014-12-12 MED ORDER — HEPARIN SOD (PORK) LOCK FLUSH 100 UNIT/ML IV SOLN
500.0000 [IU] | Freq: Once | INTRAVENOUS | Status: AC | PRN
  Administered 2014-12-12: 250 [IU]
  Filled 2014-12-12: qty 5

## 2014-12-12 MED ORDER — FAMOTIDINE IN NACL 20-0.9 MG/50ML-% IV SOLN
INTRAVENOUS | Status: AC
  Filled 2014-12-12: qty 50

## 2014-12-12 MED ORDER — FAMOTIDINE IN NACL 20-0.9 MG/50ML-% IV SOLN
20.0000 mg | Freq: Two times a day (BID) | INTRAVENOUS | Status: DC
  Administered 2014-12-12: 20 mg via INTRAVENOUS

## 2014-12-12 MED ORDER — DACTINOMYCIN CHEMO INJECTION 0.5 MG
1.2500 mg/m2 | Freq: Once | INTRAVENOUS | Status: AC
  Administered 2014-12-12: 1650 ug via INTRAVENOUS
  Filled 2014-12-12: qty 3.3

## 2014-12-12 MED ORDER — SODIUM CHLORIDE 0.9 % IJ SOLN
10.0000 mL | INTRAMUSCULAR | Status: DC | PRN
  Administered 2014-12-12: 10 mL via INTRAVENOUS
  Filled 2014-12-12: qty 10

## 2014-12-12 NOTE — Telephone Encounter (Signed)
Gave avs & calendar for May/June. Sent message to schedule treatment. °

## 2014-12-12 NOTE — Telephone Encounter (Signed)
Per staff message and POF I have scheduled appts. Advised scheduler of appts. JMW  

## 2014-12-12 NOTE — Progress Notes (Signed)
OFFICE PROGRESS NOTE   Dec 12, 2014   Physicians:Rossi, Darlys Gales, Elyse Jarvis, MD, Lavonia Drafts  INTERVAL HISTORY:  Patient is seen, alone for visit, in continuing attention to treatment in process for GTN, due #3 actinomycin D today. Nausea has been worse with actinomycin D than with previous methotrexate, managed with IVF and IV antiemetics for the several days after each treatment.. She has PICC for this regimen. HCG down to 77.9   Patient had felt better last few days, with nausea resolved, then awakened this AM with LLQ pain again as she had for weeks around the GTN diagnosis. Bowels are moving daily including today with senokot, miralax and linzess. She has had no fever, no bleeding, no injury, bladder ok. Otherwise no problems with PICC, viral URI resolved, no respiratory symptoms and no HA now.  PICC placed by IR 11-14-14. Contraception is depoProvera, given 11-10-14.  ONCOLOGIC HISTORY Patient had twin pregnancy in 2015, with loss of one of the twins at [redacted] weeks gestation and delivery of full term female on 06-05-14, pathology of placenta with no hydatidiform change. 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 She was seen for lower abdominal pain and vaginal bleeding in ED 07-04-14, with quantitative hCG 389 and pelvic US showing thickened, heterogeneous endometrium with internal blood flow, suspicious for retained products of conception. She was seen by Dr Ihor Dow, with quantitative HCG 1374 on 07-26-14 and 1922 on 07-28-14. [place] 08-02-14 had 1 cm area in central uterus suspicious for retained products of conception, with no gestational sac and 3.3 cm hemorrhagic cyst on left ovary. She had D & C hysteroscopy by Dr Ihor Dow on 08-10-14, uterus 6 week size and very small amount of tissue. Pathology [phone]) was consistent with placental site reaction and retained products of conception. Quantitative hCG 08-25-14 was 4856 and repeat on 09-14-14 was 6736. CT CAP  09-09-14 had no findings of concern, with a few tiny sub-cm pulmonary nodules on left thought likely post-inflammatory. She was seen by Dr Denman George on 09-14-14, with CT findings reassuring and methotrexate recommended due to further rise in hCG. She had Implanon placed RUE 06-2014 for contraception. TSH on 08-25-14 was 2.176. First methotrexate was 09-20-2014, given weekly x 7 thru 10-31-14. HCG initially dropped, then plateau by week 7. Patient insisted on removal of Implanon on 11-10-14, was started on depoProvera by Dr Ihor Dow then. Restaging scans head CAP 11-10-14 showed no progressive or metastatic disease. Treatment was changed to Actinomycin D beginning 11-14-14.  HCG results: 07-04-14 389 07-26-2014 1374 07-28-2014 1922  D& C 08-10-14 08-25-14 4856 09-14-14 6736 09-20-14 4975 First MTX 09-25-14 4136 09-27-14 3385 Second MTX 10-04-14 2394. Third MTX 10-11-14 2378 Fourth MTX 10-17-14 1928.8 Fifth MTX 10-24-2014 2287 Sixth MTX 10-31-14 2004.7 Seventh MTX 11-11-14 2693.3 11-14-14 3275 First Actinomycin D 11-21-14 1813 11-28-14 277.9 Second Actinomycin D 12-01-14 141 12-05-14 77.9 12-12-14                 Third Actinomycin D     Review of systems as above, remainder of 10 point Review of Systems negative.  Objective:  Vital signs in last 24 hours:  BP 127/83 mmHg  Pulse 87  Temp(Src) 97.3 F (36.3 C) (Oral)  Resp 18  Ht 4\' 10"  (1.473 m)  Wt 93 lb 1.6 oz (42.23 kg)  BMI 19.46 kg/m2  SpO2 100% Weight down 1 lb Alert, oriented and appropriate. Ambulatory without difficulty.  Hair is thinning a little  HEENT:PERRL, sclerae not icteric. Oral mucosa  moist without lesions, posterior pharynx clear.  Neck supple. No JVD.  Lymphatics:no cervical,supraclavicular or inguinal adenopathy Resp: clear to auscultation bilaterally and normal percussion bilaterally Cardio: regular rate and rhythm. No gallop. GI: soft, slightly tender LLQ in localized area, no  rebound, not distended, no mass or organomegaly. Normally active bowel sounds.  Musculoskeletal/ Extremities: without pitting edema, cords, tenderness Neuro: nonfocal. PSYCH appropriate mood and affect Skin without rash, ecchymosis, petechiae PICC dressing intact, site unremarkable  Lab Results:  Results for orders placed or performed in visit on 12/12/14  CBC with Differential  Result Value Ref Range   WBC 6.3 3.9 - 10.3 10e3/uL   NEUT# 3.2 1.5 - 6.5 10e3/uL   HGB 10.8 (L) 11.6 - 15.9 g/dL   HCT 33.0 (L) 34.8 - 46.6 %   Platelets 141 (L) 145 - 400 10e3/uL   MCV 95.7 79.5 - 101.0 fL   MCH 31.3 25.1 - 34.0 pg   MCHC 32.7 31.5 - 36.0 g/dL   RBC 3.45 (L) 3.70 - 5.45 10e6/uL   RDW 13.2 11.2 - 14.5 %   lymph# 2.2 0.9 - 3.3 10e3/uL   MONO# 0.7 0.1 - 0.9 10e3/uL   Eosinophils Absolute 0.2 0.0 - 0.5 10e3/uL   Basophils Absolute 0.0 0.0 - 0.1 10e3/uL   NEUT% 51.2 38.4 - 76.8 %   LYMPH% 35.3 14.0 - 49.7 %   MONO% 10.7 0.0 - 14.0 %   EOS% 2.5 0.0 - 7.0 %   BASO% 0.3 0.0 - 2.0 %    CMET entirely normal  HCG drawn and pending  Studies/Results:  No results found.  Medications: I have reviewed the patient's current medications. Nausea more difficult to manage as patient does not tolerate zofran. Note we have not tried Reglan.  DISCUSSION: etiology of the LLQ pain was not clear earlier in course, but symptoms improved with heating pad moreso than any other interventions attempted.  Patient in agreement with continuing treatment today as planned, with IVF and IV antiemetics added for 5-24; IVF will also be given 5-26.   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 Had 7 cycles weekly MTX, marker plateau'd and restaging scans without evidence of metastatic disease. Actinomycin D 26 mg/m2 begun 11-14-14, and will be given once every other week, following HCG levels weekly. Additional IVF and IV  antiemetic necessary. 2.PICC in for ActD, needs flush q Mon Thurs.  3.Contraception necessary for a full year after completing GTN treatment, during ongoing monitoring of HCG marker then. Patient insisted that Nexplanon be removed on 11-10-14, given depoProvera that day, due next depoProvera in 3 months (12 weeks = July 14) 4.sub-cm scattered pulmonary nodules left lung not thought likely related to GTN at time of initial imaging and stable on restaging scans 11-10-14. 5.LLQ abdominal pain: bothersome again today after at least 2 weeks of no symptoms. Resume heating pad. 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 Pyelonephritis 2014, resolved with oral antibiotics  7.likely environmental allergies: improved 8.underweight due to inadequate caloric intake: improving 9. Constipation since pregnancy, worse with chemo. Bowels are moving with 3 medications regularly. 10.Social issues: Child care and transportation are more manageable for next few weeks with boyfriend able to assist.   Chemo orders confirmed. Time spent 25 min including >50% counseling and coordination of care.    LIVESAY,LENNIS P, MD   12/12/2014, 10:56 AM

## 2014-12-12 NOTE — Telephone Encounter (Signed)
Spoke with Dr. Marko Plume and received instructions for patient to drink just clear liquids tonight, take compazine ATC along with lorazepam and then come in as scheduled for IV fluids tomorrow.  Called back to patient and advised her of the above.  She states she has a hard time keeping the compazine and lorazepam down-vomits them back up.  Instructed her to let lorazepam dissolve under her tongue and not to swallow it whole to try to keep from vomiting it back up. Also if she continues to vomit then she should go to the ED for IV  Fluids and IV  Antiemetics.  She verbalized understanding.

## 2014-12-12 NOTE — Patient Instructions (Signed)

## 2014-12-12 NOTE — Patient Instructions (Signed)
[place] Discharge Instructions for Patients Receiving Chemotherapy  Today you received the following chemotherapy agents: Dactinomycin  To help prevent nausea and vomiting after your treatment, we encourage you to take your nausea medication as prescribed by your physician.    If you develop nausea and vomiting that is not controlled by your nausea medication, call the clinic.   BELOW ARE SYMPTOMS THAT SHOULD BE REPORTED IMMEDIATELY:  *FEVER GREATER THAN 100.5 F  *CHILLS WITH OR WITHOUT FEVER  NAUSEA AND VOMITING THAT IS NOT CONTROLLED WITH YOUR NAUSEA MEDICATION  *UNUSUAL SHORTNESS OF BREATH  *UNUSUAL BRUISING OR BLEEDING  TENDERNESS IN MOUTH AND THROAT WITH OR WITHOUT PRESENCE OF ULCERS  *URINARY PROBLEMS  *BOWEL PROBLEMS  UNUSUAL RASH Items with * indicate a potential emergency and should be followed up as soon as possible.  Feel free to call the clinic you have any questions or concerns. The clinic phone number is [phone].  Please show the [place] at check-in to the Emergency Department and triage nurse.

## 2014-12-12 NOTE — Telephone Encounter (Signed)
TC from patient. She states she had chemo today and recieved the pre-meds as ordered but now has nausea and vomiting that has not been helped with her compazine and lorazepam. Please advise

## 2014-12-13 ENCOUNTER — Other Ambulatory Visit: Payer: Self-pay | Admitting: Medical Oncology

## 2014-12-13 ENCOUNTER — Ambulatory Visit (HOSPITAL_BASED_OUTPATIENT_CLINIC_OR_DEPARTMENT_OTHER): Payer: Medicaid Other

## 2014-12-13 VITALS — BP 116/72 | HR 102 | Temp 98.2°F | Resp 18

## 2014-12-13 DIAGNOSIS — O010 Classical hydatidiform mole: Secondary | ICD-10-CM

## 2014-12-13 DIAGNOSIS — E639 Nutritional deficiency, unspecified: Secondary | ICD-10-CM

## 2014-12-13 DIAGNOSIS — O019 Hydatidiform mole, unspecified: Secondary | ICD-10-CM

## 2014-12-13 MED ORDER — PROCHLORPERAZINE EDISYLATE 5 MG/ML IJ SOLN
10.0000 mg | Freq: Once | INTRAMUSCULAR | Status: AC
  Administered 2014-12-13: 10 mg via INTRAVENOUS

## 2014-12-13 MED ORDER — SODIUM CHLORIDE 0.9 % IV SOLN
1000.0000 mL | Freq: Once | INTRAVENOUS | Status: AC
  Administered 2014-12-13: 1000 mL via INTRAVENOUS

## 2014-12-13 MED ORDER — PROCHLORPERAZINE EDISYLATE 5 MG/ML IJ SOLN
INTRAMUSCULAR | Status: AC
  Filled 2014-12-13: qty 2

## 2014-12-13 MED ORDER — SODIUM CHLORIDE 0.9 % IV SOLN
4.0000 mg | Freq: Once | INTRAVENOUS | Status: AC
  Administered 2014-12-13: 4 mg via INTRAVENOUS
  Filled 2014-12-13: qty 0.4

## 2014-12-13 NOTE — Patient Instructions (Signed)
Dehydration, Adult Dehydration is when you lose more fluids from the body than you take in. Vital organs like the kidneys, brain, and heart cannot function without a proper amount of fluids and salt. Any loss of fluids from the body can cause dehydration.  CAUSES   Vomiting.  Diarrhea.  Excessive sweating.  Excessive urine output.  Fever. SYMPTOMS  Mild dehydration  Thirst.  Dry lips.  Slightly dry mouth. Moderate dehydration  Very dry mouth.  Sunken eyes.  Skin does not bounce back quickly when lightly pinched and released.  Dark urine and decreased urine production.  Decreased tear production.  Headache. Severe dehydration  Very dry mouth.  Extreme thirst.  Rapid, weak pulse (more than 100 beats per minute at rest).  Cold hands and feet.  Not able to sweat in spite of heat and temperature.  Rapid breathing.  Blue lips.  Confusion and lethargy.  Difficulty being awakened.  Minimal urine production.  No tears. DIAGNOSIS  Your caregiver will diagnose dehydration based on your symptoms and your exam. Blood and urine tests will help confirm the diagnosis. The diagnostic evaluation should also identify the cause of dehydration. TREATMENT  Treatment of mild or moderate dehydration can often be done at home by increasing the amount of fluids that you drink. It is best to drink small amounts of fluid more often. Drinking too much at one time can make vomiting worse. Refer to the home care instructions below. Severe dehydration needs to be treated at the hospital where you will probably be given intravenous (IV) fluids that contain water and electrolytes. HOME CARE INSTRUCTIONS   Ask your caregiver about specific rehydration instructions.  Drink enough fluids to keep your urine clear or pale yellow.  Drink small amounts frequently if you have nausea and vomiting.  Eat as you normally do.  Avoid:  Foods or drinks high in sugar.  Carbonated  drinks.  Juice.  Extremely hot or cold fluids.  Drinks with caffeine.  Fatty, greasy foods.  Alcohol.  Tobacco.  Overeating.  Gelatin desserts.  Wash your hands well to avoid spreading bacteria and viruses.  Only take over-the-counter or prescription medicines for pain, discomfort, or fever as directed by your caregiver.  Ask your caregiver if you should continue all prescribed and over-the-counter medicines.  Keep all follow-up appointments with your caregiver. SEEK MEDICAL CARE IF:  You have abdominal pain and it increases or stays in one area (localizes).  You have a rash, stiff neck, or severe headache.  You are irritable, sleepy, or difficult to awaken.  You are weak, dizzy, or extremely thirsty. SEEK IMMEDIATE MEDICAL CARE IF:   You are unable to keep fluids down or you get worse despite treatment.  You have frequent episodes of vomiting or diarrhea.  You have blood or green matter (bile) in your vomit.  You have blood in your stool or your stool looks black and tarry.  You have not urinated in 6 to 8 hours, or you have only urinated a small amount of very dark urine.  You have a fever.  You faint. MAKE SURE YOU:   Understand these instructions.  Will watch your condition.  Will get help right away if you are not doing well or get worse. Document Released: 07/08/2005 Document Revised: 09/30/2011 Document Reviewed: 02/25/2011 ExitCare Patient Information 2015 [employer]. This information is not intended to replace advice given to you by your health care provider. Make sure you discuss any questions you have with your health care   provider.  

## 2014-12-14 ENCOUNTER — Encounter: Payer: Self-pay | Admitting: Oncology

## 2014-12-14 ENCOUNTER — Other Ambulatory Visit: Payer: Self-pay | Admitting: Oncology

## 2014-12-14 ENCOUNTER — Telehealth: Payer: Self-pay

## 2014-12-14 DIAGNOSIS — T451X5A Adverse effect of antineoplastic and immunosuppressive drugs, initial encounter: Secondary | ICD-10-CM | POA: Insufficient documentation

## 2014-12-14 DIAGNOSIS — O019 Hydatidiform mole, unspecified: Secondary | ICD-10-CM

## 2014-12-14 DIAGNOSIS — K5900 Constipation, unspecified: Secondary | ICD-10-CM | POA: Insufficient documentation

## 2014-12-14 DIAGNOSIS — R1030 Lower abdominal pain, unspecified: Secondary | ICD-10-CM | POA: Insufficient documentation

## 2014-12-14 DIAGNOSIS — R112 Nausea with vomiting, unspecified: Secondary | ICD-10-CM | POA: Insufficient documentation

## 2014-12-14 LAB — HCG, QUANTITATIVE, PREGNANCY: HCG, BETA CHAIN, QUANT, S: 2.9 m[IU]/mL

## 2014-12-14 NOTE — Telephone Encounter (Signed)
Told Ms. Callow the result of the quantivataive HCG as noted below.  Pt. Pleased.   She is drinking at least 64 oz of fluid daily.  Will be here for PICC flush tomorrow as scheduled.

## 2014-12-14 NOTE — Progress Notes (Signed)
Medical Oncology  Incorrect HCG test ordered in error for 12-12-14, noted when no result from that lab as of today. Discussed again with lab now, who will let me know if unable to change test on remainder of sample from 12-12-14 (in which case it will need to be redrawn on 12-15-14). Correct test order for [facility] lab is HCG quant pregnancy   L.Marko Plume, MD

## 2014-12-14 NOTE — Telephone Encounter (Signed)
-----   Message from Gordy Levan, MD sent at 12/14/2014  2:27 PM EDT ----- Labs seen and need follow up: please let her know marker is down to 2.9 (normal <5). I will let Dr Denman George know

## 2014-12-15 ENCOUNTER — Ambulatory Visit (HOSPITAL_BASED_OUTPATIENT_CLINIC_OR_DEPARTMENT_OTHER): Payer: Medicaid Other

## 2014-12-15 ENCOUNTER — Ambulatory Visit: Payer: Medicaid Other

## 2014-12-15 VITALS — BP 118/81 | HR 78 | Temp 98.2°F | Resp 20

## 2014-12-15 DIAGNOSIS — Z452 Encounter for adjustment and management of vascular access device: Secondary | ICD-10-CM | POA: Diagnosis not present

## 2014-12-15 DIAGNOSIS — E639 Nutritional deficiency, unspecified: Secondary | ICD-10-CM

## 2014-12-15 DIAGNOSIS — O010 Classical hydatidiform mole: Secondary | ICD-10-CM | POA: Diagnosis not present

## 2014-12-15 MED ORDER — HEPARIN SOD (PORK) LOCK FLUSH 100 UNIT/ML IV SOLN
250.0000 [IU] | INTRAVENOUS | Status: AC | PRN
  Administered 2014-12-15: 250 [IU]
  Filled 2014-12-15: qty 5

## 2014-12-15 MED ORDER — SODIUM CHLORIDE 0.9 % IJ SOLN
10.0000 mL | INTRAMUSCULAR | Status: AC | PRN
  Administered 2014-12-15: 10 mL
  Filled 2014-12-15: qty 10

## 2014-12-15 MED ORDER — SODIUM CHLORIDE 0.9 % IJ SOLN
10.0000 mL | INTRAMUSCULAR | Status: DC | PRN
  Filled 2014-12-15: qty 10

## 2014-12-15 MED ORDER — HEPARIN SOD (PORK) LOCK FLUSH 100 UNIT/ML IV SOLN
500.0000 [IU] | Freq: Once | INTRAVENOUS | Status: DC
  Filled 2014-12-15: qty 5

## 2014-12-15 NOTE — Progress Notes (Addendum)
Pt states that she is feeling good today and does not need IV fluids. PICC line dressing changed.  Pt requests opcite dressing underneath sorbaview d/t previous itching with dressings.  Site to PICC unremarkable.  Pt also requests that PICC dressings be placed sideways.

## 2014-12-15 NOTE — Patient Instructions (Signed)
PICC Home Guide A peripherally inserted central catheter (PICC) is a long, thin, flexible tube that is inserted into a vein in the upper arm. It is a form of intravenous (IV) access. It is considered to be a "central" line because the tip of the PICC ends in a large vein in your chest. This large vein is called the superior vena cava (SVC). The PICC tip ends in the SVC because there is a lot of blood flow in the SVC. This allows medicines and IV fluids to be quickly distributed throughout the body. The PICC is inserted using a sterile technique by a specially trained nurse or physician. After the PICC is inserted, a chest X-ray exam is done to be sure it is in the correct place.  A PICC may be placed for different reasons, such as:  To give medicines and liquid nutrition that can only be given through a central line. Examples are:  Certain antibiotic treatments.  Chemotherapy.  Total parenteral nutrition (TPN).  To take frequent blood samples.  To give IV fluids and blood products.  If there is difficulty placing a peripheral intravenous (PIV) catheter. If taken care of properly, a PICC can remain in place for several months. A PICC can also allow a person to go home from the hospital early. Medicine and PICC care can be managed at home by a family member or home health care team. WHAT PROBLEMS CAN HAPPEN WHEN I HAVE A PICC? Problems with a PICC can occasionally occur. These may include the following:  A blood clot (thrombus) forming in or at the tip of the PICC. This can cause the PICC to become clogged. A clot-dissolving medicine called tissue plasminogen activator (tPA) can be given through the PICC to help break up the clot.  Inflammation of the vein (phlebitis) in which the PICC is placed. Signs of inflammation may include redness, pain at the insertion site, red streaks, or being able to feel a "cord" in the vein where the PICC is located.  Infection in the PICC or at the insertion  site. Signs of infection may include fever, chills, redness, swelling, or pus drainage from the PICC insertion site.  PICC movement (malposition). The PICC tip may move from its original position due to excessive physical activity, forceful coughing, sneezing, or vomiting.  A break or cut in the PICC. It is important to not use scissors near the PICC.  Nerve or tendon irritation or injury during PICC insertion. WHAT SHOULD I KEEP IN MIND ABOUT ACTIVITIES WHEN I HAVE A PICC?  You may bend your arm and move it freely. If your PICC is near or at the bend of your elbow, avoid activity with repeated motion at the elbow.  Rest at home for the remainder of the day following PICC line insertion.  Avoid lifting heavy objects as instructed by your health care provider.  Avoid using a crutch with the arm on the same side as your PICC. You may need to use a walker. WHAT SHOULD I KNOW ABOUT MY PICC DRESSING?  Keep your PICC bandage (dressing) clean and dry to prevent infection.  Ask your health care provider when you may shower. Ask your health care provider to teach you how to wrap the PICC when you do take a shower.  Change the PICC dressing as instructed by your health care provider.  Change your PICC dressing if it becomes loose or wet. WHAT SHOULD I KNOW ABOUT PICC CARE?  Check the PICC insertion site   daily for leakage, redness, swelling, or pain.  Do not take a bath, swim, or use hot tubs when you have a PICC. Cover PICC line with clear plastic wrap and tape to keep it dry while showering.  Flush the PICC as directed by your health care provider. Let your health care provider know right away if the PICC is difficult to flush or does not flush. Do not use force to flush the PICC.  Do not use a syringe that is less than 10 mL to flush the PICC.  Never pull or tug on the PICC.  Avoid blood pressure checks on the arm with the PICC.  Keep your PICC identification card with you at all  times.  Do not take the PICC out yourself. Only a trained clinical professional should remove the PICC. SEEK IMMEDIATE MEDICAL CARE IF:  Your PICC is accidentally pulled all the way out. If this happens, cover the insertion site with a bandage or gauze dressing. Do not throw the PICC away. Your health care provider will need to inspect it.  Your PICC was tugged or pulled and has partially come out. Do not  push the PICC back in.  There is any type of drainage, redness, or swelling where the PICC enters the skin.  You cannot flush the PICC, it is difficult to flush, or the PICC leaks around the insertion site when it is flushed.  You hear a "flushing" sound when the PICC is flushed.  You have pain, discomfort, or numbness in your arm, shoulder, or jaw on the same side as the PICC.  You feel your heart "racing" or skipping beats.  You notice a hole or tear in the PICC.  You develop chills or a fever. MAKE SURE YOU:   Understand these instructions.  Will watch your condition.  Will get help right away if you are not doing well or get worse. Document Released: 01/12/2003 Document Revised: 11/22/2013 Document Reviewed: 03/15/2013 ExitCare Patient Information 2015 [employer]. This information is not intended to replace advice given to you by your health care provider. Make sure you discuss any questions you have with your health care provider.  

## 2014-12-20 ENCOUNTER — Ambulatory Visit (HOSPITAL_BASED_OUTPATIENT_CLINIC_OR_DEPARTMENT_OTHER): Payer: Medicaid Other

## 2014-12-20 ENCOUNTER — Other Ambulatory Visit (HOSPITAL_BASED_OUTPATIENT_CLINIC_OR_DEPARTMENT_OTHER): Payer: Medicaid Other

## 2014-12-20 VITALS — BP 114/76 | HR 90

## 2014-12-20 DIAGNOSIS — O010 Classical hydatidiform mole: Secondary | ICD-10-CM

## 2014-12-20 DIAGNOSIS — Z452 Encounter for adjustment and management of vascular access device: Secondary | ICD-10-CM

## 2014-12-20 DIAGNOSIS — O019 Hydatidiform mole, unspecified: Secondary | ICD-10-CM

## 2014-12-20 LAB — COMPREHENSIVE METABOLIC PANEL (CC13)
ALBUMIN: 4 g/dL (ref 3.5–5.0)
ALK PHOS: 59 U/L (ref 40–150)
ALT: 36 U/L (ref 0–55)
ANION GAP: 10 meq/L (ref 3–11)
AST: 29 U/L (ref 5–34)
BILIRUBIN TOTAL: 0.27 mg/dL (ref 0.20–1.20)
BUN: 7 mg/dL (ref 7.0–26.0)
CALCIUM: 8.9 mg/dL (ref 8.4–10.4)
CO2: 22 mEq/L (ref 22–29)
Chloride: 108 mEq/L (ref 98–109)
Creatinine: 0.7 mg/dL (ref 0.6–1.1)
Glucose: 139 mg/dl (ref 70–140)
Potassium: 3.8 mEq/L (ref 3.5–5.1)
SODIUM: 140 meq/L (ref 136–145)
Total Protein: 7.1 g/dL (ref 6.4–8.3)

## 2014-12-20 LAB — CBC WITH DIFFERENTIAL/PLATELET
BASO%: 0.6 % (ref 0.0–2.0)
Basophils Absolute: 0 10*3/uL (ref 0.0–0.1)
EOS ABS: 0.2 10*3/uL (ref 0.0–0.5)
EOS%: 3.2 % (ref 0.0–7.0)
HCT: 30.9 % — ABNORMAL LOW (ref 34.8–46.6)
HEMOGLOBIN: 10.3 g/dL — AB (ref 11.6–15.9)
LYMPH%: 39 % (ref 14.0–49.7)
MCH: 31.4 pg (ref 25.1–34.0)
MCHC: 33.3 g/dL (ref 31.5–36.0)
MCV: 94.2 fL (ref 79.5–101.0)
MONO#: 0.5 10*3/uL (ref 0.1–0.9)
MONO%: 7.6 % (ref 0.0–14.0)
NEUT#: 3.4 10*3/uL (ref 1.5–6.5)
NEUT%: 49.6 % (ref 38.4–76.8)
PLATELETS: 172 10*3/uL (ref 145–400)
RBC: 3.28 10*6/uL — AB (ref 3.70–5.45)
RDW: 13.5 % (ref 11.2–14.5)
WBC: 6.9 10*3/uL (ref 3.9–10.3)
lymph#: 2.7 10*3/uL (ref 0.9–3.3)

## 2014-12-20 MED ORDER — HEPARIN SOD (PORK) LOCK FLUSH 100 UNIT/ML IV SOLN
500.0000 [IU] | Freq: Once | INTRAVENOUS | Status: AC
  Administered 2014-12-20: 250 [IU] via INTRAVENOUS
  Filled 2014-12-20: qty 5

## 2014-12-20 MED ORDER — SODIUM CHLORIDE 0.9 % IJ SOLN
10.0000 mL | INTRAMUSCULAR | Status: DC | PRN
  Administered 2014-12-20: 10 mL via INTRAVENOUS
  Filled 2014-12-20: qty 10

## 2014-12-20 NOTE — Patient Instructions (Signed)
PICC Home Guide A peripherally inserted central catheter (PICC) is a long, thin, flexible tube that is inserted into a vein in the upper arm. It is a form of intravenous (IV) access. It is considered to be a "central" line because the tip of the PICC ends in a large vein in your chest. This large vein is called the superior vena cava (SVC). The PICC tip ends in the SVC because there is a lot of blood flow in the SVC. This allows medicines and IV fluids to be quickly distributed throughout the body. The PICC is inserted using a sterile technique by a specially trained nurse or physician. After the PICC is inserted, a chest X-ray exam is done to be sure it is in the correct place.  A PICC may be placed for different reasons, such as:  To give medicines and liquid nutrition that can only be given through a central line. Examples are:  Certain antibiotic treatments.  Chemotherapy.  Total parenteral nutrition (TPN).  To take frequent blood samples.  To give IV fluids and blood products.  If there is difficulty placing a peripheral intravenous (PIV) catheter. If taken care of properly, a PICC can remain in place for several months. A PICC can also allow a person to go home from the hospital early. Medicine and PICC care can be managed at home by a family member or home health care team. WHAT PROBLEMS CAN HAPPEN WHEN I HAVE A PICC? Problems with a PICC can occasionally occur. These may include the following:  A blood clot (thrombus) forming in or at the tip of the PICC. This can cause the PICC to become clogged. A clot-dissolving medicine called tissue plasminogen activator (tPA) can be given through the PICC to help break up the clot.  Inflammation of the vein (phlebitis) in which the PICC is placed. Signs of inflammation may include redness, pain at the insertion site, red streaks, or being able to feel a "cord" in the vein where the PICC is located.  Infection in the PICC or at the insertion  site. Signs of infection may include fever, chills, redness, swelling, or pus drainage from the PICC insertion site.  PICC movement (malposition). The PICC tip may move from its original position due to excessive physical activity, forceful coughing, sneezing, or vomiting.  A break or cut in the PICC. It is important to not use scissors near the PICC.  Nerve or tendon irritation or injury during PICC insertion. WHAT SHOULD I KEEP IN MIND ABOUT ACTIVITIES WHEN I HAVE A PICC?  You may bend your arm and move it freely. If your PICC is near or at the bend of your elbow, avoid activity with repeated motion at the elbow.  Rest at home for the remainder of the day following PICC line insertion.  Avoid lifting heavy objects as instructed by your health care provider.  Avoid using a crutch with the arm on the same side as your PICC. You may need to use a walker. WHAT SHOULD I KNOW ABOUT MY PICC DRESSING?  Keep your PICC bandage (dressing) clean and dry to prevent infection.  Ask your health care provider when you may shower. Ask your health care provider to teach you how to wrap the PICC when you do take a shower.  Change the PICC dressing as instructed by your health care provider.  Change your PICC dressing if it becomes loose or wet. WHAT SHOULD I KNOW ABOUT PICC CARE?  Check the PICC insertion site   daily for leakage, redness, swelling, or pain.  Do not take a bath, swim, or use hot tubs when you have a PICC. Cover PICC line with clear plastic wrap and tape to keep it dry while showering.  Flush the PICC as directed by your health care provider. Let your health care provider know right away if the PICC is difficult to flush or does not flush. Do not use force to flush the PICC.  Do not use a syringe that is less than 10 mL to flush the PICC.  Never pull or tug on the PICC.  Avoid blood pressure checks on the arm with the PICC.  Keep your PICC identification card with you at all  times.  Do not take the PICC out yourself. Only a trained clinical professional should remove the PICC. SEEK IMMEDIATE MEDICAL CARE IF:  Your PICC is accidentally pulled all the way out. If this happens, cover the insertion site with a bandage or gauze dressing. Do not throw the PICC away. Your health care provider will need to inspect it.  Your PICC was tugged or pulled and has partially come out. Do not  push the PICC back in.  There is any type of drainage, redness, or swelling where the PICC enters the skin.  You cannot flush the PICC, it is difficult to flush, or the PICC leaks around the insertion site when it is flushed.  You hear a "flushing" sound when the PICC is flushed.  You have pain, discomfort, or numbness in your arm, shoulder, or jaw on the same side as the PICC.  You feel your heart "racing" or skipping beats.  You notice a hole or tear in the PICC.  You develop chills or a fever. MAKE SURE YOU:   Understand these instructions.  Will watch your condition.  Will get help right away if you are not doing well or get worse. Document Released: 01/12/2003 Document Revised: 11/22/2013 Document Reviewed: 03/15/2013 ExitCare Patient Information 2015 [employer]. This information is not intended to replace advice given to you by your health care provider. Make sure you discuss any questions you have with your health care provider.  

## 2014-12-21 ENCOUNTER — Other Ambulatory Visit: Payer: Self-pay | Admitting: Oncology

## 2014-12-21 ENCOUNTER — Telehealth: Payer: Self-pay | Admitting: Oncology

## 2014-12-21 LAB — HCG, QUANTITATIVE, PREGNANCY

## 2014-12-21 NOTE — Telephone Encounter (Signed)
Labs cancelled flush only per 06/01 POF.... Cherylann Banas

## 2014-12-22 ENCOUNTER — Encounter: Payer: Self-pay | Admitting: Oncology

## 2014-12-22 ENCOUNTER — Ambulatory Visit (HOSPITAL_BASED_OUTPATIENT_CLINIC_OR_DEPARTMENT_OTHER): Payer: Medicaid Other

## 2014-12-22 ENCOUNTER — Other Ambulatory Visit: Payer: Medicaid Other

## 2014-12-22 ENCOUNTER — Telehealth: Payer: Self-pay | Admitting: Oncology

## 2014-12-22 ENCOUNTER — Other Ambulatory Visit: Payer: Self-pay | Admitting: *Deleted

## 2014-12-22 ENCOUNTER — Ambulatory Visit (HOSPITAL_BASED_OUTPATIENT_CLINIC_OR_DEPARTMENT_OTHER): Payer: Medicaid Other | Admitting: Oncology

## 2014-12-22 VITALS — BP 106/76 | HR 82 | Temp 97.3°F | Resp 18 | Ht <= 58 in | Wt 95.1 lb

## 2014-12-22 DIAGNOSIS — O010 Classical hydatidiform mole: Secondary | ICD-10-CM

## 2014-12-22 DIAGNOSIS — Z452 Encounter for adjustment and management of vascular access device: Secondary | ICD-10-CM

## 2014-12-22 DIAGNOSIS — D6481 Anemia due to antineoplastic chemotherapy: Secondary | ICD-10-CM | POA: Diagnosis not present

## 2014-12-22 DIAGNOSIS — T451X5A Adverse effect of antineoplastic and immunosuppressive drugs, initial encounter: Secondary | ICD-10-CM

## 2014-12-22 DIAGNOSIS — R112 Nausea with vomiting, unspecified: Secondary | ICD-10-CM | POA: Diagnosis not present

## 2014-12-22 DIAGNOSIS — O019 Hydatidiform mole, unspecified: Secondary | ICD-10-CM

## 2014-12-22 MED ORDER — SODIUM CHLORIDE 0.9 % IJ SOLN
10.0000 mL | INTRAMUSCULAR | Status: DC | PRN
  Administered 2014-12-22: 10 mL via INTRAVENOUS
  Filled 2014-12-22: qty 10

## 2014-12-22 MED ORDER — HEPARIN SOD (PORK) LOCK FLUSH 100 UNIT/ML IV SOLN
500.0000 [IU] | Freq: Once | INTRAVENOUS | Status: AC
  Administered 2014-12-22: 250 [IU] via INTRAVENOUS
  Filled 2014-12-22: qty 5

## 2014-12-22 MED ORDER — FERROUS FUMARATE 325 (106 FE) MG PO TABS
ORAL_TABLET | ORAL | Status: DC
Start: 2014-12-22 — End: 2015-04-04

## 2014-12-22 NOTE — Telephone Encounter (Signed)
Appointments made and avs printed for patient °

## 2014-12-22 NOTE — Progress Notes (Signed)
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 HCG was down to 2.9 on 12-12-14; plan is total of 3 cycles from that normal value (5-23, 6-6, 01-09-15).   Patient tolerated chemotherapy on 5-23 adequately with EMEND at chemo and  IVF with compazine and decadron on day 2; she had some nausea next few days at home, manageable with antiemetics. She has felt well this week, with no nausea, good appetite and minimal fatigue. The LLQ pain which had started again day of last chemo resolved in next 1-2 days with local heat; she has no pain there now. Bowels are moving daily with the multiple laxatives, occasionally loose stools such that she adjusts the medications. She has had no bleeding.   PICC placed by IR 11-14-14. Contraception is depoProvera, given 11-10-14.  Baby is crawling  ONCOLOGIC HISTORY Patient had twin pregnancy in 2015, with loss of one of the twins at [redacted] weeks gestation and delivery of full term female on 06-05-14, pathology of placenta with no hydatidiform change. 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 She was seen for lower abdominal pain and vaginal bleeding in ED 07-04-14, with quantitative hCG 389 and pelvic US showing thickened, heterogeneous endometrium with internal blood flow, suspicious for retained products of conception. She was seen by Dr Ihor Dow, with quantitative HCG 1374 on 07-26-14 and 1922 on 07-28-14. [place] 08-02-14 had 1 cm area in central uterus suspicious for retained products of conception, with no gestational sac and 3.3 cm hemorrhagic cyst on left ovary. She had D & C hysteroscopy by Dr Ihor Dow on 08-10-14, uterus 6 week size and very small amount of tissue. Pathology [phone]) was  consistent with placental site reaction and retained products of conception. Quantitative hCG 08-25-14 was 4856 and repeat on 09-14-14 was 6736. CT CAP 09-09-14 had no findings of concern, with a few tiny sub-cm pulmonary nodules on left thought likely post-inflammatory. She was seen by Dr Denman George on 09-14-14, with CT findings reassuring and methotrexate recommended due to further rise in hCG. She had Implanon placed RUE 06-2014 for contraception. TSH on 08-25-14 was 2.176. First methotrexate was 09-20-2014, given weekly x 7 thru 10-31-14. HCG initially dropped, then plateau by week 7. Patient insisted on removal of Implanon on 11-10-14, was started on depoProvera by Dr Ihor Dow then. Restaging scans head CAP 11-10-14 showed no progressive or metastatic disease. Treatment was changed to Actinomycin D beginning 11-14-14.  HCG results: 07-04-14 389 07-26-2014 1374 07-28-2014 1922  D& C 08-10-14 08-25-14 4856 09-14-14 6736 09-20-14 4975 First MTX 09-25-14 4136 09-27-14 3385 Second MTX 10-04-14 2394. Third MTX 10-11-14 2378 Fourth MTX 10-17-14 1928.8 Fifth MTX 10-24-2014 2287 Sixth MTX 10-31-14 2004.7 Seventh MTX 11-11-14 2693.3 11-14-14 3275 First Actinomycin D 11-21-14 1813 11-28-14 277.9 Second Actinomycin D 12-01-14 141 12-05-14 77.9 5-23-162.9 Third Actinomycin D 12-20-14     <2    Review of systems as above, also: No fever or symptoms of infection. No SOB or other respiratory symptoms. No problems with PICC Remainder of 10 point Review of Systems negative.  Objective:  Vital signs in last 24 hours:  BP 106/76 mmHg  Pulse 82  Temp(Src) 97.3 F (36.3 C) (Oral)  Resp 18  Ht $R'4\' 10"'ah$  (1.473 m)  Wt 95 lb 1.6  oz (43.137 kg)  BMI 19.88 kg/m2  SpO2 100% Weight up 2 lbs. Alert, oriented and appropriate. Ambulatory without difficulty.  No alopecia  HEENT:PERRL, sclerae not icteric. Oral mucosa moist without lesions, posterior pharynx clear.  Neck  supple. No JVD.  Lymphatics:no peripheral adenopathy Resp: clear to auscultation bilaterally and normal percussion bilaterally Cardio: regular rate and rhythm. No gallop. GI: soft, nontender including LLQ, not distended, no mass or organomegaly. Normally active bowel sounds.  Musculoskeletal/ Extremities: without pitting edema, cords, tenderness Neuro: no peripheral neuropathy. Otherwise nonfocal. Psych appropriate mood and affect Skin without rash, ecchymosis, petechiae PICC dressing clean and intact, insertion site not remarkable, no swelling or tenderness above or below  Lab Results:  Results for orders placed or performed in visit on 12/20/14  CBC with Differential  Result Value Ref Range   WBC 6.9 3.9 - 10.3 10e3/uL   NEUT# 3.4 1.5 - 6.5 10e3/uL   HGB 10.3 (L) 11.6 - 15.9 g/dL   HCT 30.9 (L) 34.8 - 46.6 %   Platelets 172 145 - 400 10e3/uL   MCV 94.2 79.5 - 101.0 fL   MCH 31.4 25.1 - 34.0 pg   MCHC 33.3 31.5 - 36.0 g/dL   RBC 3.28 (L) 3.70 - 5.45 10e6/uL   RDW 13.5 11.2 - 14.5 %   lymph# 2.7 0.9 - 3.3 10e3/uL   MONO# 0.5 0.1 - 0.9 10e3/uL   Eosinophils Absolute 0.2 0.0 - 0.5 10e3/uL   Basophils Absolute 0.0 0.0 - 0.1 10e3/uL   NEUT% 49.6 38.4 - 76.8 %   LYMPH% 39.0 14.0 - 49.7 %   MONO% 7.6 0.0 - 14.0 %   EOS% 3.2 0.0 - 7.0 %   BASO% 0.6 0.0 - 2.0 %  Comprehensive metabolic panel (Cmet) - [facility]  Result Value Ref Range   Sodium 140 136 - 145 mEq/L   Potassium 3.8 3.5 - 5.1 mEq/L   Chloride 108 98 - 109 mEq/L   CO2 22 22 - 29 mEq/L   Glucose 139 70 - 140 mg/dl   BUN 7.0 7.0 - 26.0 mg/dL   Creatinine 0.7 0.6 - 1.1 mg/dL   Total Bilirubin 0.27 0.20 - 1.20 mg/dL   Alkaline Phosphatase 59 40 - 150 U/L   AST 29 5 - 34 U/L   ALT 36 0 - 55 U/L   Total Protein 7.1 6.4 - 8.3 g/dL   Albumin 4.0 3.5 - 5.0 g/dL   Calcium 8.9 8.4 - 10.4 mg/dL   Anion Gap 10 3 - 11 mEq/L   EGFR >90 >90 ml/min/1.73 m2  hCG, quantitative, pregnancy  Result Value Ref Range   hCG, Beta Chain,  Quant, S <2.0 mIU/mL     Studies/Results:  No results found.  Medications: I have reviewed the patient's current medications. Add hemocyte/ ferrous fumarate one daily on empty stomach with OJ.  DISCUSSION: patient and significant other understand rationale for 2 more cycles of the actinomycin D, which will be given with same antiemetics and with IVF at least on day 2. Patient knows to call if she needs additional IVF beyond day 2 either cycle. WIll keep PICC until she is stable from the last chemo, then DC. They are fully aware that she must stay on contraception/ not become pregnant for a full year after completion of chemo, as we monitor the HCG.  Oral iron as above, if tolerates given tendency to constipation.  Assessment/Plan:  1.GTN following full term pregnancy/ loss of one twin at 10 weeks, in  otherwise healthy 24 yo lady. Had 7 cycles weekly MTX, marker plateau'd and restaging scans without evidence of metastatic disease. Actinomycin D 1.25 mg/m2 begun 11-14-14, with HCG normal day of cycle 3 12-12-14. Plan total of 3 cycles beyond normal value, with treatments also on 6-6 and 01-09-15. IVF and antiemetics as cycle 3. 2.PICC in for ActD, needs flush q Mon Thurs.  3.Contraception necessary for a full year after completing GTN treatment, during ongoing monitoring of HCG marker then. Patient insisted that Nexplanon be removed on 11-10-14, given depoProvera that day, due next depoProvera in 3 months (12 weeks = July 14) 4.sub-cm scattered pulmonary nodules left lung not thought likely related to GTN at time of initial imaging and stable on restaging scans 11-10-14. 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 Pyelonephritis 2014, resolved with oral antibiotics  7.likely environmental allergies: improved 8.underweight due to inadequate caloric intake: improving 9. Constipation since after pregnancy, worse with chemo. Bowels are moving  with 3 medications regularly. 10.Social issues: Child care and transportation are more manageable for next few weeks with boyfriend able to assist.   All questions answered. Chemo and supportive care orders confirmed. I will see her on 01-05-15, labs weekly from PICC. Time spent 25 min including >50% counseling and coordination of care.   LIVESAY,LENNIS P, MD   12/22/2014, 12:11 PM

## 2014-12-22 NOTE — Patient Instructions (Signed)
PICC Home Guide A peripherally inserted central catheter (PICC) is a long, thin, flexible tube that is inserted into a vein in the upper arm. It is a form of intravenous (IV) access. It is considered to be a "central" line because the tip of the PICC ends in a large vein in your chest. This large vein is called the superior vena cava (SVC). The PICC tip ends in the SVC because there is a lot of blood flow in the SVC. This allows medicines and IV fluids to be quickly distributed throughout the body. The PICC is inserted using a sterile technique by a specially trained nurse or physician. After the PICC is inserted, a chest X-ray exam is done to be sure it is in the correct place.  A PICC may be placed for different reasons, such as:  To give medicines and liquid nutrition that can only be given through a central line. Examples are:  Certain antibiotic treatments.  Chemotherapy.  Total parenteral nutrition (TPN).  To take frequent blood samples.  To give IV fluids and blood products.  If there is difficulty placing a peripheral intravenous (PIV) catheter. If taken care of properly, a PICC can remain in place for several months. A PICC can also allow a person to go home from the hospital early. Medicine and PICC care can be managed at home by a family member or home health care team. WHAT PROBLEMS CAN HAPPEN WHEN I HAVE A PICC? Problems with a PICC can occasionally occur. These may include the following:  A blood clot (thrombus) forming in or at the tip of the PICC. This can cause the PICC to become clogged. A clot-dissolving medicine called tissue plasminogen activator (tPA) can be given through the PICC to help break up the clot.  Inflammation of the vein (phlebitis) in which the PICC is placed. Signs of inflammation may include redness, pain at the insertion site, red streaks, or being able to feel a "cord" in the vein where the PICC is located.  Infection in the PICC or at the insertion  site. Signs of infection may include fever, chills, redness, swelling, or pus drainage from the PICC insertion site.  PICC movement (malposition). The PICC tip may move from its original position due to excessive physical activity, forceful coughing, sneezing, or vomiting.  A break or cut in the PICC. It is important to not use scissors near the PICC.  Nerve or tendon irritation or injury during PICC insertion. WHAT SHOULD I KEEP IN MIND ABOUT ACTIVITIES WHEN I HAVE A PICC?  You may bend your arm and move it freely. If your PICC is near or at the bend of your elbow, avoid activity with repeated motion at the elbow.  Rest at home for the remainder of the day following PICC line insertion.  Avoid lifting heavy objects as instructed by your health care provider.  Avoid using a crutch with the arm on the same side as your PICC. You may need to use a walker. WHAT SHOULD I KNOW ABOUT MY PICC DRESSING?  Keep your PICC bandage (dressing) clean and dry to prevent infection.  Ask your health care provider when you may shower. Ask your health care provider to teach you how to wrap the PICC when you do take a shower.  Change the PICC dressing as instructed by your health care provider.  Change your PICC dressing if it becomes loose or wet. WHAT SHOULD I KNOW ABOUT PICC CARE?  Check the PICC insertion site   daily for leakage, redness, swelling, or pain.  Do not take a bath, swim, or use hot tubs when you have a PICC. Cover PICC line with clear plastic wrap and tape to keep it dry while showering.  Flush the PICC as directed by your health care provider. Let your health care provider know right away if the PICC is difficult to flush or does not flush. Do not use force to flush the PICC.  Do not use a syringe that is less than 10 mL to flush the PICC.  Never pull or tug on the PICC.  Avoid blood pressure checks on the arm with the PICC.  Keep your PICC identification card with you at all  times.  Do not take the PICC out yourself. Only a trained clinical professional should remove the PICC. SEEK IMMEDIATE MEDICAL CARE IF:  Your PICC is accidentally pulled all the way out. If this happens, cover the insertion site with a bandage or gauze dressing. Do not throw the PICC away. Your health care provider will need to inspect it.  Your PICC was tugged or pulled and has partially come out. Do not  push the PICC back in.  There is any type of drainage, redness, or swelling where the PICC enters the skin.  You cannot flush the PICC, it is difficult to flush, or the PICC leaks around the insertion site when it is flushed.  You hear a "flushing" sound when the PICC is flushed.  You have pain, discomfort, or numbness in your arm, shoulder, or jaw on the same side as the PICC.  You feel your heart "racing" or skipping beats.  You notice a hole or tear in the PICC.  You develop chills or a fever. MAKE SURE YOU:   Understand these instructions.  Will watch your condition.  Will get help right away if you are not doing well or get worse. Document Released: 01/12/2003 Document Revised: 11/22/2013 Document Reviewed: 03/15/2013 ExitCare Patient Information 2015 [employer]. This information is not intended to replace advice given to you by your health care provider. Make sure you discuss any questions you have with your health care provider.  

## 2014-12-22 NOTE — Telephone Encounter (Signed)
Appointments made for 6/20,avs printed

## 2014-12-24 DIAGNOSIS — Z452 Encounter for adjustment and management of vascular access device: Secondary | ICD-10-CM

## 2014-12-24 DIAGNOSIS — D6481 Anemia due to antineoplastic chemotherapy: Secondary | ICD-10-CM | POA: Insufficient documentation

## 2014-12-24 DIAGNOSIS — T451X5A Adverse effect of antineoplastic and immunosuppressive drugs, initial encounter: Secondary | ICD-10-CM

## 2014-12-24 HISTORY — DX: Encounter for adjustment and management of vascular access device: Z45.2

## 2014-12-26 ENCOUNTER — Other Ambulatory Visit (HOSPITAL_BASED_OUTPATIENT_CLINIC_OR_DEPARTMENT_OTHER): Payer: Medicaid Other

## 2014-12-26 ENCOUNTER — Ambulatory Visit: Payer: Medicaid Other

## 2014-12-26 ENCOUNTER — Ambulatory Visit (HOSPITAL_BASED_OUTPATIENT_CLINIC_OR_DEPARTMENT_OTHER): Payer: Medicaid Other

## 2014-12-26 VITALS — BP 110/70 | HR 90 | Temp 97.3°F | Resp 18

## 2014-12-26 DIAGNOSIS — Z5111 Encounter for antineoplastic chemotherapy: Secondary | ICD-10-CM | POA: Diagnosis not present

## 2014-12-26 DIAGNOSIS — O010 Classical hydatidiform mole: Secondary | ICD-10-CM | POA: Diagnosis not present

## 2014-12-26 DIAGNOSIS — Z95828 Presence of other vascular implants and grafts: Secondary | ICD-10-CM

## 2014-12-26 DIAGNOSIS — O019 Hydatidiform mole, unspecified: Secondary | ICD-10-CM

## 2014-12-26 LAB — CBC WITH DIFFERENTIAL/PLATELET
BASO%: 0.4 % (ref 0.0–2.0)
BASOS ABS: 0 10*3/uL (ref 0.0–0.1)
EOS%: 2.4 % (ref 0.0–7.0)
Eosinophils Absolute: 0.2 10*3/uL (ref 0.0–0.5)
HCT: 30.7 % — ABNORMAL LOW (ref 34.8–46.6)
HGB: 10.4 g/dL — ABNORMAL LOW (ref 11.6–15.9)
LYMPH#: 1.9 10*3/uL (ref 0.9–3.3)
LYMPH%: 19.6 % (ref 14.0–49.7)
MCH: 31.6 pg (ref 25.1–34.0)
MCHC: 33.8 g/dL (ref 31.5–36.0)
MCV: 93.7 fL (ref 79.5–101.0)
MONO#: 1.2 10*3/uL — ABNORMAL HIGH (ref 0.1–0.9)
MONO%: 12.5 % (ref 0.0–14.0)
NEUT%: 65.1 % (ref 38.4–76.8)
NEUTROS ABS: 6.2 10*3/uL (ref 1.5–6.5)
Platelets: 154 10*3/uL (ref 145–400)
RBC: 3.28 10*6/uL — ABNORMAL LOW (ref 3.70–5.45)
RDW: 13.2 % (ref 11.2–14.5)
WBC: 9.5 10*3/uL (ref 3.9–10.3)

## 2014-12-26 LAB — COMPREHENSIVE METABOLIC PANEL (CC13)
ALK PHOS: 70 U/L (ref 40–150)
ALT: 21 U/L (ref 0–55)
AST: 17 U/L (ref 5–34)
Albumin: 3.9 g/dL (ref 3.5–5.0)
Anion Gap: 8 mEq/L (ref 3–11)
BUN: 7.8 mg/dL (ref 7.0–26.0)
CO2: 23 mEq/L (ref 22–29)
CREATININE: 0.6 mg/dL (ref 0.6–1.1)
Calcium: 9.1 mg/dL (ref 8.4–10.4)
Chloride: 108 mEq/L (ref 98–109)
GLUCOSE: 127 mg/dL (ref 70–140)
POTASSIUM: 4.1 meq/L (ref 3.5–5.1)
Sodium: 140 mEq/L (ref 136–145)
Total Bilirubin: 0.45 mg/dL (ref 0.20–1.20)
Total Protein: 7 g/dL (ref 6.4–8.3)

## 2014-12-26 MED ORDER — PROCHLORPERAZINE EDISYLATE 5 MG/ML IJ SOLN
INTRAMUSCULAR | Status: AC
  Filled 2014-12-26: qty 2

## 2014-12-26 MED ORDER — PROCHLORPERAZINE EDISYLATE 5 MG/ML IJ SOLN
10.0000 mg | Freq: Once | INTRAMUSCULAR | Status: AC
  Administered 2014-12-26: 10 mg via INTRAVENOUS

## 2014-12-26 MED ORDER — HEPARIN SOD (PORK) LOCK FLUSH 100 UNIT/ML IV SOLN
500.0000 [IU] | Freq: Once | INTRAVENOUS | Status: AC
  Administered 2014-12-26: 500 [IU] via INTRAVENOUS
  Filled 2014-12-26: qty 5

## 2014-12-26 MED ORDER — SODIUM CHLORIDE 0.9 % IV SOLN
Freq: Once | INTRAVENOUS | Status: AC
  Administered 2014-12-26: 12:00:00 via INTRAVENOUS

## 2014-12-26 MED ORDER — FAMOTIDINE IN NACL 20-0.9 MG/50ML-% IV SOLN
20.0000 mg | Freq: Once | INTRAVENOUS | Status: AC
  Administered 2014-12-26: 20 mg via INTRAVENOUS

## 2014-12-26 MED ORDER — FAMOTIDINE IN NACL 20-0.9 MG/50ML-% IV SOLN
INTRAVENOUS | Status: AC
  Filled 2014-12-26: qty 50

## 2014-12-26 MED ORDER — SODIUM CHLORIDE 0.9 % IJ SOLN
10.0000 mL | INTRAMUSCULAR | Status: DC | PRN
  Administered 2014-12-26: 10 mL via INTRAVENOUS
  Filled 2014-12-26: qty 10

## 2014-12-26 MED ORDER — HEPARIN SOD (PORK) LOCK FLUSH 100 UNIT/ML IV SOLN
500.0000 [IU] | Freq: Once | INTRAVENOUS | Status: AC | PRN
  Administered 2014-12-26: 500 [IU]
  Filled 2014-12-26: qty 5

## 2014-12-26 MED ORDER — SODIUM CHLORIDE 0.9 % IV SOLN
1.2500 mg/m2 | Freq: Once | INTRAVENOUS | Status: AC
  Administered 2014-12-26: 1650 ug via INTRAVENOUS
  Filled 2014-12-26: qty 3.3

## 2014-12-26 MED ORDER — SODIUM CHLORIDE 0.9 % IJ SOLN
10.0000 mL | INTRAMUSCULAR | Status: DC | PRN
  Administered 2014-12-26: 10 mL
  Filled 2014-12-26: qty 10

## 2014-12-26 MED ORDER — SODIUM CHLORIDE 0.9 % IV SOLN
Freq: Once | INTRAVENOUS | Status: AC
  Administered 2014-12-26: 12:00:00 via INTRAVENOUS
  Filled 2014-12-26: qty 5

## 2014-12-26 NOTE — Patient Instructions (Addendum)
[place] Discharge Instructions for Patients Receiving Chemotherapy  Today you received the following chemotherapy agents DACTINOMYCIN   To help prevent nausea and vomiting after your treatment, we encourage you to take your nausea medication AS DIRECTED If you develop nausea and vomiting that is not controlled by your nausea medication, call the clinic.   BELOW ARE SYMPTOMS THAT SHOULD BE REPORTED IMMEDIATELY:  *FEVER GREATER THAN 100.5 F  *CHILLS WITH OR WITHOUT FEVER  NAUSEA AND VOMITING THAT IS NOT CONTROLLED WITH YOUR NAUSEA MEDICATION  *UNUSUAL SHORTNESS OF BREATH  *UNUSUAL BRUISING OR BLEEDING  TENDERNESS IN MOUTH AND THROAT WITH OR WITHOUT PRESENCE OF ULCERS  *URINARY PROBLEMS  *BOWEL PROBLEMS  UNUSUAL RASH Items with * indicate a potential emergency and should be followed up as soon as possible.  Feel free to call the clinic you have any questions or concerns. The clinic phone number is [phone].  Please show the [place] at check-in to the Emergency Department and triage nurse.

## 2014-12-26 NOTE — Patient Instructions (Signed)
PICC Home Guide A peripherally inserted central catheter (PICC) is a long, thin, flexible tube that is inserted into a vein in the upper arm. It is a form of intravenous (IV) access. It is considered to be a "central" line because the tip of the PICC ends in a large vein in your chest. This large vein is called the superior vena cava (SVC). The PICC tip ends in the SVC because there is a lot of blood flow in the SVC. This allows medicines and IV fluids to be quickly distributed throughout the body. The PICC is inserted using a sterile technique by a specially trained nurse or physician. After the PICC is inserted, a chest X-ray exam is done to be sure it is in the correct place.  A PICC may be placed for different reasons, such as:  To give medicines and liquid nutrition that can only be given through a central line. Examples are:  Certain antibiotic treatments.  Chemotherapy.  Total parenteral nutrition (TPN).  To take frequent blood samples.  To give IV fluids and blood products.  If there is difficulty placing a peripheral intravenous (PIV) catheter. If taken care of properly, a PICC can remain in place for several months. A PICC can also allow a person to go home from the hospital early. Medicine and PICC care can be managed at home by a family member or home health care team. WHAT PROBLEMS CAN HAPPEN WHEN I HAVE A PICC? Problems with a PICC can occasionally occur. These may include the following:  A blood clot (thrombus) forming in or at the tip of the PICC. This can cause the PICC to become clogged. A clot-dissolving medicine called tissue plasminogen activator (tPA) can be given through the PICC to help break up the clot.  Inflammation of the vein (phlebitis) in which the PICC is placed. Signs of inflammation may include redness, pain at the insertion site, red streaks, or being able to feel a "cord" in the vein where the PICC is located.  Infection in the PICC or at the insertion  site. Signs of infection may include fever, chills, redness, swelling, or pus drainage from the PICC insertion site.  PICC movement (malposition). The PICC tip may move from its original position due to excessive physical activity, forceful coughing, sneezing, or vomiting.  A break or cut in the PICC. It is important to not use scissors near the PICC.  Nerve or tendon irritation or injury during PICC insertion. WHAT SHOULD I KEEP IN MIND ABOUT ACTIVITIES WHEN I HAVE A PICC?  You may bend your arm and move it freely. If your PICC is near or at the bend of your elbow, avoid activity with repeated motion at the elbow.  Rest at home for the remainder of the day following PICC line insertion.  Avoid lifting heavy objects as instructed by your health care provider.  Avoid using a crutch with the arm on the same side as your PICC. You may need to use a walker. WHAT SHOULD I KNOW ABOUT MY PICC DRESSING?  Keep your PICC bandage (dressing) clean and dry to prevent infection.  Ask your health care provider when you may shower. Ask your health care provider to teach you how to wrap the PICC when you do take a shower.  Change the PICC dressing as instructed by your health care provider.  Change your PICC dressing if it becomes loose or wet. WHAT SHOULD I KNOW ABOUT PICC CARE?  Check the PICC insertion site   daily for leakage, redness, swelling, or pain.  Do not take a bath, swim, or use hot tubs when you have a PICC. Cover PICC line with clear plastic wrap and tape to keep it dry while showering.  Flush the PICC as directed by your health care provider. Let your health care provider know right away if the PICC is difficult to flush or does not flush. Do not use force to flush the PICC.  Do not use a syringe that is less than 10 mL to flush the PICC.  Never pull or tug on the PICC.  Avoid blood pressure checks on the arm with the PICC.  Keep your PICC identification card with you at all  times.  Do not take the PICC out yourself. Only a trained clinical professional should remove the PICC. SEEK IMMEDIATE MEDICAL CARE IF:  Your PICC is accidentally pulled all the way out. If this happens, cover the insertion site with a bandage or gauze dressing. Do not throw the PICC away. Your health care provider will need to inspect it.  Your PICC was tugged or pulled and has partially come out. Do not  push the PICC back in.  There is any type of drainage, redness, or swelling where the PICC enters the skin.  You cannot flush the PICC, it is difficult to flush, or the PICC leaks around the insertion site when it is flushed.  You hear a "flushing" sound when the PICC is flushed.  You have pain, discomfort, or numbness in your arm, shoulder, or jaw on the same side as the PICC.  You feel your heart "racing" or skipping beats.  You notice a hole or tear in the PICC.  You develop chills or a fever. MAKE SURE YOU:   Understand these instructions.  Will watch your condition.  Will get help right away if you are not doing well or get worse. Document Released: 01/12/2003 Document Revised: 11/22/2013 Document Reviewed: 03/15/2013 ExitCare Patient Information 2015 [employer]. This information is not intended to replace advice given to you by your health care provider. Make sure you discuss any questions you have with your health care provider.  

## 2014-12-27 ENCOUNTER — Ambulatory Visit (HOSPITAL_BASED_OUTPATIENT_CLINIC_OR_DEPARTMENT_OTHER): Payer: Medicaid Other

## 2014-12-27 ENCOUNTER — Telehealth: Payer: Self-pay

## 2014-12-27 VITALS — BP 119/66 | HR 90 | Temp 97.5°F | Resp 18

## 2014-12-27 DIAGNOSIS — O010 Classical hydatidiform mole: Secondary | ICD-10-CM | POA: Diagnosis not present

## 2014-12-27 DIAGNOSIS — O019 Hydatidiform mole, unspecified: Secondary | ICD-10-CM

## 2014-12-27 LAB — HCG, QUANTITATIVE, PREGNANCY: hCG, Beta Chain, Quant, S: <2 m[IU]/mL

## 2014-12-27 MED ORDER — SODIUM CHLORIDE 0.9 % IJ SOLN
10.0000 mL | Freq: Once | INTRAMUSCULAR | Status: AC
  Administered 2014-12-27: 10 mL
  Filled 2014-12-27: qty 10

## 2014-12-27 MED ORDER — SODIUM CHLORIDE 0.9 % IV SOLN
4.0000 mg | Freq: Once | INTRAVENOUS | Status: AC
  Administered 2014-12-27: 4 mg via INTRAVENOUS
  Filled 2014-12-27: qty 0.4

## 2014-12-27 MED ORDER — SODIUM CHLORIDE 0.9 % IV SOLN
Freq: Once | INTRAVENOUS | Status: AC
  Administered 2014-12-27: 15:00:00 via INTRAVENOUS

## 2014-12-27 MED ORDER — HEPARIN SOD (PORK) LOCK FLUSH 100 UNIT/ML IV SOLN
250.0000 [IU] | Freq: Once | INTRAVENOUS | Status: AC
  Administered 2014-12-27: 250 [IU] via INTRAVENOUS
  Filled 2014-12-27: qty 5

## 2014-12-27 NOTE — Patient Instructions (Signed)

## 2014-12-27 NOTE — Telephone Encounter (Signed)
Told Shelly Koch  the results of the quantitative HCG as noted below by Dr. Marko Plume. Pt. Requests a refill on her Linzess.  She was given #12 on 11-25-14 by Dr. Corliss Skains for a trial and was to be back in touch with Dr. Corliss Skains in 2 weeks to follow up.  Pt. Has not been back in touch with the office.  Told her she needed to call his office for a follow up appointment.  Medication will not be refilled by this office.  Patient verbalized understanding.

## 2014-12-27 NOTE — Telephone Encounter (Signed)
-----   Message from Gordy Levan, MD sent at 12/27/2014  8:15 AM EDT ----- Labs seen and need follow up: please let her know HCG still in good low range, <2. (She is for IVF at [facility] 6-7 afternoon)

## 2014-12-27 NOTE — Progress Notes (Signed)
1640 Patient states she is ready to go.

## 2014-12-29 ENCOUNTER — Ambulatory Visit (HOSPITAL_BASED_OUTPATIENT_CLINIC_OR_DEPARTMENT_OTHER): Payer: Medicaid Other

## 2014-12-29 ENCOUNTER — Ambulatory Visit: Payer: Medicaid Other

## 2014-12-29 VITALS — BP 114/72 | HR 89 | Temp 97.7°F

## 2014-12-29 DIAGNOSIS — O010 Classical hydatidiform mole: Secondary | ICD-10-CM

## 2014-12-29 DIAGNOSIS — Z452 Encounter for adjustment and management of vascular access device: Secondary | ICD-10-CM

## 2014-12-29 MED ORDER — HEPARIN SOD (PORK) LOCK FLUSH 100 UNIT/ML IV SOLN
500.0000 [IU] | Freq: Once | INTRAVENOUS | Status: AC
  Administered 2014-12-29: 250 [IU] via INTRAVENOUS
  Filled 2014-12-29: qty 5

## 2014-12-29 MED ORDER — SODIUM CHLORIDE 0.9 % IJ SOLN
10.0000 mL | INTRAMUSCULAR | Status: DC | PRN
Start: 2014-12-29 — End: 2014-12-29
  Administered 2014-12-29: 10 mL via INTRAVENOUS
  Filled 2014-12-29: qty 10

## 2014-12-29 NOTE — Patient Instructions (Signed)
PICC Home Guide A peripherally inserted central catheter (PICC) is a long, thin, flexible tube that is inserted into a vein in the upper arm. It is a form of intravenous (IV) access. It is considered to be a "central" line because the tip of the PICC ends in a large vein in your chest. This large vein is called the superior vena cava (SVC). The PICC tip ends in the SVC because there is a lot of blood flow in the SVC. This allows medicines and IV fluids to be quickly distributed throughout the body. The PICC is inserted using a sterile technique by a specially trained nurse or physician. After the PICC is inserted, a chest X-ray exam is done to be sure it is in the correct place.  A PICC may be placed for different reasons, such as:  To give medicines and liquid nutrition that can only be given through a central line. Examples are:  Certain antibiotic treatments.  Chemotherapy.  Total parenteral nutrition (TPN).  To take frequent blood samples.  To give IV fluids and blood products.  If there is difficulty placing a peripheral intravenous (PIV) catheter. If taken care of properly, a PICC can remain in place for several months. A PICC can also allow a person to go home from the hospital early. Medicine and PICC care can be managed at home by a family member or home health care team. WHAT PROBLEMS CAN HAPPEN WHEN I HAVE A PICC? Problems with a PICC can occasionally occur. These may include the following:  A blood clot (thrombus) forming in or at the tip of the PICC. This can cause the PICC to become clogged. A clot-dissolving medicine called tissue plasminogen activator (tPA) can be given through the PICC to help break up the clot.  Inflammation of the vein (phlebitis) in which the PICC is placed. Signs of inflammation may include redness, pain at the insertion site, red streaks, or being able to feel a "cord" in the vein where the PICC is located.  Infection in the PICC or at the insertion  site. Signs of infection may include fever, chills, redness, swelling, or pus drainage from the PICC insertion site.  PICC movement (malposition). The PICC tip may move from its original position due to excessive physical activity, forceful coughing, sneezing, or vomiting.  A break or cut in the PICC. It is important to not use scissors near the PICC.  Nerve or tendon irritation or injury during PICC insertion. WHAT SHOULD I KEEP IN MIND ABOUT ACTIVITIES WHEN I HAVE A PICC?  You may bend your arm and move it freely. If your PICC is near or at the bend of your elbow, avoid activity with repeated motion at the elbow.  Rest at home for the remainder of the day following PICC line insertion.  Avoid lifting heavy objects as instructed by your health care provider.  Avoid using a crutch with the arm on the same side as your PICC. You may need to use a walker. WHAT SHOULD I KNOW ABOUT MY PICC DRESSING?  Keep your PICC bandage (dressing) clean and dry to prevent infection.  Ask your health care provider when you may shower. Ask your health care provider to teach you how to wrap the PICC when you do take a shower.  Change the PICC dressing as instructed by your health care provider.  Change your PICC dressing if it becomes loose or wet. WHAT SHOULD I KNOW ABOUT PICC CARE?  Check the PICC insertion site   daily for leakage, redness, swelling, or pain.  Do not take a bath, swim, or use hot tubs when you have a PICC. Cover PICC line with clear plastic wrap and tape to keep it dry while showering.  Flush the PICC as directed by your health care provider. Let your health care provider know right away if the PICC is difficult to flush or does not flush. Do not use force to flush the PICC.  Do not use a syringe that is less than 10 mL to flush the PICC.  Never pull or tug on the PICC.  Avoid blood pressure checks on the arm with the PICC.  Keep your PICC identification card with you at all  times.  Do not take the PICC out yourself. Only a trained clinical professional should remove the PICC. SEEK IMMEDIATE MEDICAL CARE IF:  Your PICC is accidentally pulled all the way out. If this happens, cover the insertion site with a bandage or gauze dressing. Do not throw the PICC away. Your health care provider will need to inspect it.  Your PICC was tugged or pulled and has partially come out. Do not  push the PICC back in.  There is any type of drainage, redness, or swelling where the PICC enters the skin.  You cannot flush the PICC, it is difficult to flush, or the PICC leaks around the insertion site when it is flushed.  You hear a "flushing" sound when the PICC is flushed.  You have pain, discomfort, or numbness in your arm, shoulder, or jaw on the same side as the PICC.  You feel your heart "racing" or skipping beats.  You notice a hole or tear in the PICC.  You develop chills or a fever. MAKE SURE YOU:   Understand these instructions.  Will watch your condition.  Will get help right away if you are not doing well or get worse. Document Released: 01/12/2003 Document Revised: 11/22/2013 Document Reviewed: 03/15/2013 ExitCare Patient Information 2015 [employer]. This information is not intended to replace advice given to you by your health care provider. Make sure you discuss any questions you have with your health care provider.  

## 2015-01-01 ENCOUNTER — Other Ambulatory Visit: Payer: Self-pay | Admitting: Oncology

## 2015-01-02 ENCOUNTER — Other Ambulatory Visit: Payer: Self-pay | Admitting: *Deleted

## 2015-01-02 ENCOUNTER — Telehealth: Payer: Self-pay | Admitting: *Deleted

## 2015-01-02 ENCOUNTER — Ambulatory Visit: Payer: Medicaid Other

## 2015-01-02 ENCOUNTER — Ambulatory Visit (HOSPITAL_BASED_OUTPATIENT_CLINIC_OR_DEPARTMENT_OTHER): Payer: Medicaid Other

## 2015-01-02 ENCOUNTER — Encounter: Payer: Medicaid Other | Admitting: Nurse Practitioner

## 2015-01-02 VITALS — BP 114/72 | HR 105 | Temp 97.9°F | Resp 18

## 2015-01-02 DIAGNOSIS — O010 Classical hydatidiform mole: Secondary | ICD-10-CM | POA: Diagnosis not present

## 2015-01-02 DIAGNOSIS — R059 Cough, unspecified: Secondary | ICD-10-CM

## 2015-01-02 DIAGNOSIS — Z452 Encounter for adjustment and management of vascular access device: Secondary | ICD-10-CM | POA: Diagnosis not present

## 2015-01-02 DIAGNOSIS — R05 Cough: Secondary | ICD-10-CM

## 2015-01-02 MED ORDER — HEPARIN SOD (PORK) LOCK FLUSH 100 UNIT/ML IV SOLN
500.0000 [IU] | Freq: Once | INTRAVENOUS | Status: AC
  Administered 2015-01-02: 250 [IU] via INTRAVENOUS
  Filled 2015-01-02: qty 5

## 2015-01-02 MED ORDER — SODIUM CHLORIDE 0.9 % IJ SOLN
10.0000 mL | INTRAMUSCULAR | Status: DC | PRN
  Administered 2015-01-02: 10 mL via INTRAVENOUS
  Filled 2015-01-02: qty 10

## 2015-01-02 NOTE — Telephone Encounter (Signed)
Spoke to pt- she had left the cancer center prior to appt with Selena Lesser, NP. Pt states she decided to make an appt with her PCP for her cough and cold symptoms.

## 2015-01-02 NOTE — Progress Notes (Signed)
Pt  arrived to flush room 2, complain of chest and throat soreness, coughing episode that caused small amount blood in sputum also complain coughing up green and yellow sputum since 12/30/14. No fever. Spoke with Barbaraann Share, Dr. Marko Plume nurse stated she would like Selena Lesser in [facility] to follow up.

## 2015-01-02 NOTE — Patient Instructions (Signed)
PICC Home Guide A peripherally inserted central catheter (PICC) is a long, thin, flexible tube that is inserted into a vein in the upper arm. It is a form of intravenous (IV) access. It is considered to be a "central" line because the tip of the PICC ends in a large vein in your chest. This large vein is called the superior vena cava (SVC). The PICC tip ends in the SVC because there is a lot of blood flow in the SVC. This allows medicines and IV fluids to be quickly distributed throughout the body. The PICC is inserted using a sterile technique by a specially trained nurse or physician. After the PICC is inserted, a chest X-ray exam is done to be sure it is in the correct place.  A PICC may be placed for different reasons, such as:  To give medicines and liquid nutrition that can only be given through a central line. Examples are:  Certain antibiotic treatments.  Chemotherapy.  Total parenteral nutrition (TPN).  To take frequent blood samples.  To give IV fluids and blood products.  If there is difficulty placing a peripheral intravenous (PIV) catheter. If taken care of properly, a PICC can remain in place for several months. A PICC can also allow a person to go home from the hospital early. Medicine and PICC care can be managed at home by a family member or home health care team. WHAT PROBLEMS CAN HAPPEN WHEN I HAVE A PICC? Problems with a PICC can occasionally occur. These may include the following:  A blood clot (thrombus) forming in or at the tip of the PICC. This can cause the PICC to become clogged. A clot-dissolving medicine called tissue plasminogen activator (tPA) can be given through the PICC to help break up the clot.  Inflammation of the vein (phlebitis) in which the PICC is placed. Signs of inflammation may include redness, pain at the insertion site, red streaks, or being able to feel a "cord" in the vein where the PICC is located.  Infection in the PICC or at the insertion  site. Signs of infection may include fever, chills, redness, swelling, or pus drainage from the PICC insertion site.  PICC movement (malposition). The PICC tip may move from its original position due to excessive physical activity, forceful coughing, sneezing, or vomiting.  A break or cut in the PICC. It is important to not use scissors near the PICC.  Nerve or tendon irritation or injury during PICC insertion. WHAT SHOULD I KEEP IN MIND ABOUT ACTIVITIES WHEN I HAVE A PICC?  You may bend your arm and move it freely. If your PICC is near or at the bend of your elbow, avoid activity with repeated motion at the elbow.  Rest at home for the remainder of the day following PICC line insertion.  Avoid lifting heavy objects as instructed by your health care provider.  Avoid using a crutch with the arm on the same side as your PICC. You may need to use a walker. WHAT SHOULD I KNOW ABOUT MY PICC DRESSING?  Keep your PICC bandage (dressing) clean and dry to prevent infection.  Ask your health care provider when you may shower. Ask your health care provider to teach you how to wrap the PICC when you do take a shower.  Change the PICC dressing as instructed by your health care provider.  Change your PICC dressing if it becomes loose or wet. WHAT SHOULD I KNOW ABOUT PICC CARE?  Check the PICC insertion site   daily for leakage, redness, swelling, or pain.  Do not take a bath, swim, or use hot tubs when you have a PICC. Cover PICC line with clear plastic wrap and tape to keep it dry while showering.  Flush the PICC as directed by your health care provider. Let your health care provider know right away if the PICC is difficult to flush or does not flush. Do not use force to flush the PICC.  Do not use a syringe that is less than 10 mL to flush the PICC.  Never pull or tug on the PICC.  Avoid blood pressure checks on the arm with the PICC.  Keep your PICC identification card with you at all  times.  Do not take the PICC out yourself. Only a trained clinical professional should remove the PICC. SEEK IMMEDIATE MEDICAL CARE IF:  Your PICC is accidentally pulled all the way out. If this happens, cover the insertion site with a bandage or gauze dressing. Do not throw the PICC away. Your health care provider will need to inspect it.  Your PICC was tugged or pulled and has partially come out. Do not  push the PICC back in.  There is any type of drainage, redness, or swelling where the PICC enters the skin.  You cannot flush the PICC, it is difficult to flush, or the PICC leaks around the insertion site when it is flushed.  You hear a "flushing" sound when the PICC is flushed.  You have pain, discomfort, or numbness in your arm, shoulder, or jaw on the same side as the PICC.  You feel your heart "racing" or skipping beats.  You notice a hole or tear in the PICC.  You develop chills or a fever. MAKE SURE YOU:   Understand these instructions.  Will watch your condition.  Will get help right away if you are not doing well or get worse. Document Released: 01/12/2003 Document Revised: 11/22/2013 Document Reviewed: 03/15/2013 ExitCare Patient Information 2015 [employer]. This information is not intended to replace advice given to you by your health care provider. Make sure you discuss any questions you have with your health care provider.  

## 2015-01-03 ENCOUNTER — Ambulatory Visit: Payer: Medicaid Other | Admitting: Family Medicine

## 2015-01-05 ENCOUNTER — Other Ambulatory Visit (HOSPITAL_BASED_OUTPATIENT_CLINIC_OR_DEPARTMENT_OTHER): Payer: Medicaid Other

## 2015-01-05 ENCOUNTER — Other Ambulatory Visit: Payer: Self-pay | Admitting: Oncology

## 2015-01-05 ENCOUNTER — Ambulatory Visit (HOSPITAL_BASED_OUTPATIENT_CLINIC_OR_DEPARTMENT_OTHER): Payer: Medicaid Other | Admitting: Oncology

## 2015-01-05 ENCOUNTER — Encounter: Payer: Self-pay | Admitting: Oncology

## 2015-01-05 ENCOUNTER — Telehealth: Payer: Self-pay | Admitting: Oncology

## 2015-01-05 ENCOUNTER — Ambulatory Visit (HOSPITAL_BASED_OUTPATIENT_CLINIC_OR_DEPARTMENT_OTHER): Payer: Medicaid Other

## 2015-01-05 VITALS — BP 120/79 | HR 92 | Temp 98.4°F | Resp 18 | Ht <= 58 in | Wt 93.8 lb

## 2015-01-05 DIAGNOSIS — Z452 Encounter for adjustment and management of vascular access device: Secondary | ICD-10-CM

## 2015-01-05 DIAGNOSIS — O019 Hydatidiform mole, unspecified: Secondary | ICD-10-CM

## 2015-01-05 DIAGNOSIS — R112 Nausea with vomiting, unspecified: Secondary | ICD-10-CM | POA: Diagnosis not present

## 2015-01-05 DIAGNOSIS — O010 Classical hydatidiform mole: Secondary | ICD-10-CM

## 2015-01-05 DIAGNOSIS — T451X5A Adverse effect of antineoplastic and immunosuppressive drugs, initial encounter: Secondary | ICD-10-CM

## 2015-01-05 DIAGNOSIS — K5900 Constipation, unspecified: Secondary | ICD-10-CM | POA: Diagnosis not present

## 2015-01-05 LAB — COMPREHENSIVE METABOLIC PANEL (CC13)
ALK PHOS: 67 U/L (ref 40–150)
ALT: 31 U/L (ref 0–55)
AST: 21 U/L (ref 5–34)
Albumin: 3.9 g/dL (ref 3.5–5.0)
Anion Gap: 7 mEq/L (ref 3–11)
BUN: 4.4 mg/dL — ABNORMAL LOW (ref 7.0–26.0)
CALCIUM: 9.2 mg/dL (ref 8.4–10.4)
CO2: 24 mEq/L (ref 22–29)
Chloride: 109 mEq/L (ref 98–109)
Creatinine: 0.6 mg/dL (ref 0.6–1.1)
EGFR: >90 mL/min/{1.73_m2} (ref >90)
Glucose: 152 mg/dl — ABNORMAL HIGH (ref 70–140)
POTASSIUM: 3.9 meq/L (ref 3.5–5.1)
Sodium: 140 mEq/L (ref 136–145)
TOTAL PROTEIN: 7.1 g/dL (ref 6.4–8.3)
Total Bilirubin: 0.27 mg/dL (ref 0.20–1.20)

## 2015-01-05 LAB — CBC WITH DIFFERENTIAL/PLATELET
BASO%: 0.1 % (ref 0.0–2.0)
Basophils Absolute: 0 10*3/uL (ref 0.0–0.1)
EOS%: 1.9 % (ref 0.0–7.0)
Eosinophils Absolute: 0.1 10*3/uL (ref 0.0–0.5)
HCT: 33 % — ABNORMAL LOW (ref 34.8–46.6)
HGB: 10.9 g/dL — ABNORMAL LOW (ref 11.6–15.9)
LYMPH%: 36.3 % (ref 14.0–49.7)
MCH: 31.4 pg (ref 25.1–34.0)
MCHC: 33 g/dL (ref 31.5–36.0)
MCV: 95.1 fL (ref 79.5–101.0)
MONO#: 0.6 10*3/uL (ref 0.1–0.9)
MONO%: 8.9 % (ref 0.0–14.0)
NEUT%: 52.8 % (ref 38.4–76.8)
NEUTROS ABS: 3.6 10*3/uL (ref 1.5–6.5)
Platelets: 181 10*3/uL (ref 145–400)
RBC: 3.47 10*6/uL — ABNORMAL LOW (ref 3.70–5.45)
RDW: 12.6 % (ref 11.2–14.5)
WBC: 6.8 10*3/uL (ref 3.9–10.3)
lymph#: 2.5 10*3/uL (ref 0.9–3.3)

## 2015-01-05 MED ORDER — HEPARIN SOD (PORK) LOCK FLUSH 100 UNIT/ML IV SOLN
500.0000 [IU] | Freq: Once | INTRAVENOUS | Status: AC
  Administered 2015-01-05: 250 [IU] via INTRAVENOUS
  Filled 2015-01-05: qty 5

## 2015-01-05 MED ORDER — SODIUM CHLORIDE 0.9 % IJ SOLN
10.0000 mL | INTRAMUSCULAR | Status: DC | PRN
  Administered 2015-01-05: 10 mL via INTRAVENOUS
  Filled 2015-01-05: qty 10

## 2015-01-05 NOTE — Patient Instructions (Signed)
PICC Home Guide A peripherally inserted central catheter (PICC) is a long, thin, flexible tube that is inserted into a vein in the upper arm. It is a form of intravenous (IV) access. It is considered to be a "central" line because the tip of the PICC ends in a large vein in your chest. This large vein is called the superior vena cava (SVC). The PICC tip ends in the SVC because there is a lot of blood flow in the SVC. This allows medicines and IV fluids to be quickly distributed throughout the body. The PICC is inserted using a sterile technique by a specially trained nurse or physician. After the PICC is inserted, a chest X-ray exam is done to be sure it is in the correct place.  A PICC may be placed for different reasons, such as:  To give medicines and liquid nutrition that can only be given through a central line. Examples are:  Certain antibiotic treatments.  Chemotherapy.  Total parenteral nutrition (TPN).  To take frequent blood samples.  To give IV fluids and blood products.  If there is difficulty placing a peripheral intravenous (PIV) catheter. If taken care of properly, a PICC can remain in place for several months. A PICC can also allow a person to go home from the hospital early. Medicine and PICC care can be managed at home by a family member or home health care team. WHAT PROBLEMS CAN HAPPEN WHEN I HAVE A PICC? Problems with a PICC can occasionally occur. These may include the following:  A blood clot (thrombus) forming in or at the tip of the PICC. This can cause the PICC to become clogged. A clot-dissolving medicine called tissue plasminogen activator (tPA) can be given through the PICC to help break up the clot.  Inflammation of the vein (phlebitis) in which the PICC is placed. Signs of inflammation may include redness, pain at the insertion site, red streaks, or being able to feel a "cord" in the vein where the PICC is located.  Infection in the PICC or at the insertion  site. Signs of infection may include fever, chills, redness, swelling, or pus drainage from the PICC insertion site.  PICC movement (malposition). The PICC tip may move from its original position due to excessive physical activity, forceful coughing, sneezing, or vomiting.  A break or cut in the PICC. It is important to not use scissors near the PICC.  Nerve or tendon irritation or injury during PICC insertion. WHAT SHOULD I KEEP IN MIND ABOUT ACTIVITIES WHEN I HAVE A PICC?  You may bend your arm and move it freely. If your PICC is near or at the bend of your elbow, avoid activity with repeated motion at the elbow.  Rest at home for the remainder of the day following PICC line insertion.  Avoid lifting heavy objects as instructed by your health care provider.  Avoid using a crutch with the arm on the same side as your PICC. You may need to use a walker. WHAT SHOULD I KNOW ABOUT MY PICC DRESSING?  Keep your PICC bandage (dressing) clean and dry to prevent infection.  Ask your health care provider when you may shower. Ask your health care provider to teach you how to wrap the PICC when you do take a shower.  Change the PICC dressing as instructed by your health care provider.  Change your PICC dressing if it becomes loose or wet. WHAT SHOULD I KNOW ABOUT PICC CARE?  Check the PICC insertion site   daily for leakage, redness, swelling, or pain.  Do not take a bath, swim, or use hot tubs when you have a PICC. Cover PICC line with clear plastic wrap and tape to keep it dry while showering.  Flush the PICC as directed by your health care provider. Let your health care provider know right away if the PICC is difficult to flush or does not flush. Do not use force to flush the PICC.  Do not use a syringe that is less than 10 mL to flush the PICC.  Never pull or tug on the PICC.  Avoid blood pressure checks on the arm with the PICC.  Keep your PICC identification card with you at all  times.  Do not take the PICC out yourself. Only a trained clinical professional should remove the PICC. SEEK IMMEDIATE MEDICAL CARE IF:  Your PICC is accidentally pulled all the way out. If this happens, cover the insertion site with a bandage or gauze dressing. Do not throw the PICC away. Your health care provider will need to inspect it.  Your PICC was tugged or pulled and has partially come out. Do not  push the PICC back in.  There is any type of drainage, redness, or swelling where the PICC enters the skin.  You cannot flush the PICC, it is difficult to flush, or the PICC leaks around the insertion site when it is flushed.  You hear a "flushing" sound when the PICC is flushed.  You have pain, discomfort, or numbness in your arm, shoulder, or jaw on the same side as the PICC.  You feel your heart "racing" or skipping beats.  You notice a hole or tear in the PICC.  You develop chills or a fever. MAKE SURE YOU:   Understand these instructions.  Will watch your condition.  Will get help right away if you are not doing well or get worse. Document Released: 01/12/2003 Document Revised: 11/22/2013 Document Reviewed: 03/15/2013 ExitCare Patient Information 2015 [employer]. This information is not intended to replace advice given to you by your health care provider. Make sure you discuss any questions you have with your health care provider.  

## 2015-01-05 NOTE — Progress Notes (Signed)
OFFICE PROGRESS NOTE   January 05, 2015   Physicians:Rossi, Darlys Gales, Elyse Jarvis, MD, Lavonia Drafts  INTERVAL HISTORY:  Patient is seen, together with significant other and their infant daughter, in continuing attention to treatment of nonmetastatic GTN, due last Actinomycin D on 01-09-15. She will have IVF on 01-10-15, due to nausea and dehydration with prior cycles of Actinomycin D, then will have PICC removed if stable following IVF that day.  Patient is feeling well now, tho she ad nausea and fatigue for ~ a week after last treatment. She is tolerating hemocyte and has had no bleeding. No problems with PICC. No LLQ pain. She has been more constipated, continues to use laxatives regularly.     PICC placed by IR 11-14-14. Contraception is depoProvera, given 11-10-14.  ONCOLOGIC HISTORY Patient had twin pregnancy in 2015, with loss of one of the twins at [redacted] weeks gestation and delivery of full term female on 06-05-14, pathology of placenta with no hydatidiform change. 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 She was seen for lower abdominal pain and vaginal bleeding in ED 07-04-14, with quantitative hCG 389 and pelvic US showing thickened, heterogeneous endometrium with internal blood flow, suspicious for retained products of conception. She was seen by Dr Ihor Dow, with quantitative HCG 1374 on 07-26-14 and 1922 on 07-28-14. [place] 08-02-14 had 1 cm area in central uterus suspicious for retained products of conception, with no gestational sac and 3.3 cm hemorrhagic cyst on left ovary. She had D & C hysteroscopy by Dr Ihor Dow on 08-10-14, uterus 6 week size and very small amount of tissue. Pathology [phone]) was consistent with placental site reaction and retained products of conception. Quantitative hCG 08-25-14 was 4856 and repeat on 09-14-14 was 6736. CT CAP 09-09-14 had no findings of concern, with a few tiny sub-cm pulmonary nodules on left thought likely  post-inflammatory. She was seen by Dr Denman George on 09-14-14, with CT findings reassuring and methotrexate recommended due to further rise in hCG. She had Implanon placed RUE 06-2014 for contraception. TSH on 08-25-14 was 2.176. First methotrexate was 09-20-2014, given weekly x 7 thru 10-31-14. HCG initially dropped, then plateau by week 7. Patient insisted on removal of Implanon on 11-10-14, was started on depoProvera by Dr Ihor Dow then. Restaging scans head CAP 11-10-14 showed no progressive or metastatic disease. Treatment was changed to Actinomycin D beginning 11-14-14.  HCG results: 07-04-14 389 07-26-2014 1374 07-28-2014 1922  D& C 08-10-14 08-25-14 4856 09-14-14 6736 09-20-14 4975 First MTX 09-25-14 4136 09-27-14 3385 Second MTX 10-04-14 2394. Third MTX 10-11-14 2378 Fourth MTX 10-17-14 1928.8 Fifth MTX 10-24-2014 2287 Sixth MTX 10-31-14 2004.7 Seventh MTX 11-11-14 2693.3 11-14-14 3275 First Actinomycin D 11-21-14 1813 11-28-14 277.9Second Actinomycin D 12-01-14 141 12-05-14 77.9 5-23-162.9Third Actinomycin D 12-20-14 <2 12-26-14   <2       Fourth Actinomycin D 01-05-15  <2   Review of systems as above, also: Slight scratchy throat yesterday, resolved. No other pain. No SOB. Remainder of 10 point Review of Systems negative.  Objective:  Vital signs in last 24 hours:  BP 120/79 mmHg  Pulse 92  Temp(Src) 98.4 F (36.9 C) (Oral)  Resp 18  Ht $R'4\' 10"'Xw$  (1.473 m)  Wt 93 lb 12.8 oz (42.547 kg)  BMI 19.61 kg/m2  SpO2 100% Weight down 1 lb Alert, oriented and appropriate. Ambulatory without assistance difficulty. No alopecia  HEENT:PERRL, sclerae not icteric. Oral mucosa moist without lesions, posterior pharynx clear.  Neck supple. No JVD.  Lymphatics:no  cervical,supraclavicular, axillary or inguinal adenopathy Resp: clear to auscultation bilaterally and normal percussion bilaterally Cardio: regular rate and rhythm. No gallop. GI: soft, nontender,  not distended, no mass or organomegaly. Normally active bowel sounds. Musculoskeletal/ Extremities: without pitting edema, cords, tenderness Neuro:  nonfocal. Psych appropriate mood and affect Skin without rash, ecchymosis, petechiae PICC dressing clean and intact, inserition site fine  Lab Results:  Results for orders placed or performed in visit on 01/05/15  CBC with Differential  Result Value Ref Range   WBC 6.8 3.9 - 10.3 10e3/uL   NEUT# 3.6 1.5 - 6.5 10e3/uL   HGB 10.9 (L) 11.6 - 15.9 g/dL   HCT 33.0 (L) 34.8 - 46.6 %   Platelets 181 145 - 400 10e3/uL   MCV 95.1 79.5 - 101.0 fL   MCH 31.4 25.1 - 34.0 pg   MCHC 33.0 31.5 - 36.0 g/dL   RBC 3.47 (L) 3.70 - 5.45 10e6/uL   RDW 12.6 11.2 - 14.5 %   lymph# 2.5 0.9 - 3.3 10e3/uL   MONO# 0.6 0.1 - 0.9 10e3/uL   Eosinophils Absolute 0.1 0.0 - 0.5 10e3/uL   Basophils Absolute 0.0 0.0 - 0.1 10e3/uL   NEUT% 52.8 38.4 - 76.8 %   LYMPH% 36.3 14.0 - 49.7 %   MONO% 8.9 0.0 - 14.0 %   EOS% 1.9 0.0 - 7.0 %   BASO% 0.1 0.0 - 2.0 %  Comprehensive metabolic panel (Cmet) - [facility]  Result Value Ref Range   Sodium 140 136 - 145 mEq/L   Potassium 3.9 3.5 - 5.1 mEq/L   Chloride 109 98 - 109 mEq/L   CO2 24 22 - 29 mEq/L   Glucose 152 (H) 70 - 140 mg/dl   BUN 4.4 (L) 7.0 - 26.0 mg/dL   Creatinine 0.6 0.6 - 1.1 mg/dL   Total Bilirubin 0.27 0.20 - 1.20 mg/dL   Alkaline Phosphatase 67 40 - 150 U/L   AST 21 5 - 34 U/L   ALT 31 0 - 55 U/L   Total Protein 7.1 6.4 - 8.3 g/dL   Albumin 3.9 3.5 - 5.0 g/dL   Calcium 9.2 8.4 - 10.4 mg/dL   Anion Gap 7 3 - 11 mEq/L   EGFR >90 >90 ml/min/1.73 m2   HCB available after visit <2.0  Studies/Results:  No results found.  Medications: I have reviewed the patient's current medications.  DISCUSSION: she will have last actinomycin D on 01-09-15, IVF + DC PICC on 01-10-15 (peripheral IV access is fine if needed after PICC removed, that line just due to Actinomycin D).  Assessment/Plan:  1.GTN following full  term pregnancy/ loss of one twin at 10 weeks, in otherwise healthy 24 yo lady. Had 7 cycles weekly MTX, marker plateau'd and restaging scans without evidence of metastatic disease. Actinomycin D 1.25 mg/m2 begun 11-14-14, with HCG normal day of cycle 3 12-12-14. Plan total of 3 cycles beyond normal value, which will be thru cycle 5 on 01-09-15. She will then go to surveillance x 1 year, with HCG monthly 2.PICC in for ActD, needs flush q Mon Thurs.  3.Contraception necessary for a full year after completing GTN treatment, during ongoing monitoring of HCG marker then. Patient insisted that Nexplanon be removed on 11-10-14, given depoProvera that day, due next depoProvera due early July 4.sub-cm scattered pulmonary nodules left lung not thought likely related to GTN at time of initial imaging and stable on restaging scans 11-10-14. 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 Pyelonephritis 2014, resolved with oral antibiotics  7.likely environmental allergies: improved 8.underweight due to inadequate caloric intake: improving 9. Constipation since after pregnancy, worse with chemo. Bowels are moving with 3 medications regularly. Hopefully this issue will resolve after chemo completes. 10.Social issues: Child care and transportation are more manageable for next few weeks with boyfriend able to assist.    All questions addressed. Chemo and IVF orders confirmed. I will see her with labs after last Actinomycin D. I will be in touch with Dr Denman George to coordinate followup. Time spent 25 min including >50% counseling and coordination of care.   LIVESAY,LENNIS P, MD   01/05/2015, 11:28 AM

## 2015-01-05 NOTE — Telephone Encounter (Signed)
picc d/c added to 6/21 appointment,labs added and have left a message for gyn onc to schedule a follow up with dr Denman George 7/25,patient will get a new schedule at 6/20 appointment and a note will be placed to do so

## 2015-01-06 LAB — HCG, QUANTITATIVE, PREGNANCY: hCG, Beta Chain, Quant, S: <2 m[IU]/mL

## 2015-01-09 ENCOUNTER — Ambulatory Visit: Payer: Medicaid Other

## 2015-01-09 ENCOUNTER — Other Ambulatory Visit (HOSPITAL_BASED_OUTPATIENT_CLINIC_OR_DEPARTMENT_OTHER): Payer: Medicaid Other

## 2015-01-09 ENCOUNTER — Ambulatory Visit (HOSPITAL_BASED_OUTPATIENT_CLINIC_OR_DEPARTMENT_OTHER): Payer: Medicaid Other

## 2015-01-09 VITALS — BP 107/63 | HR 84 | Temp 98.7°F | Resp 20

## 2015-01-09 DIAGNOSIS — Z5111 Encounter for antineoplastic chemotherapy: Secondary | ICD-10-CM

## 2015-01-09 DIAGNOSIS — O010 Classical hydatidiform mole: Secondary | ICD-10-CM

## 2015-01-09 DIAGNOSIS — Z95828 Presence of other vascular implants and grafts: Secondary | ICD-10-CM

## 2015-01-09 DIAGNOSIS — O019 Hydatidiform mole, unspecified: Secondary | ICD-10-CM

## 2015-01-09 LAB — COMPREHENSIVE METABOLIC PANEL (CC13)
ALT: 23 U/L (ref 0–55)
ANION GAP: 7 meq/L (ref 3–11)
AST: 18 U/L (ref 5–34)
Albumin: 4.1 g/dL (ref 3.5–5.0)
Alkaline Phosphatase: 59 U/L (ref 40–150)
BUN: 7.3 mg/dL (ref 7.0–26.0)
CALCIUM: 9.8 mg/dL (ref 8.4–10.4)
CHLORIDE: 106 meq/L (ref 98–109)
CO2: 26 meq/L (ref 22–29)
Creatinine: 0.6 mg/dL (ref 0.6–1.1)
EGFR: >90 mL/min/{1.73_m2} (ref >90)
Glucose: 127 mg/dl (ref 70–140)
POTASSIUM: 3.9 meq/L (ref 3.5–5.1)
Sodium: 140 mEq/L (ref 136–145)
TOTAL PROTEIN: 7 g/dL (ref 6.4–8.3)
Total Bilirubin: 0.41 mg/dL (ref 0.20–1.20)

## 2015-01-09 LAB — CBC WITH DIFFERENTIAL/PLATELET
BASO%: 0.2 % (ref 0.0–2.0)
Basophils Absolute: 0 10*3/uL (ref 0.0–0.1)
EOS%: 3.9 % (ref 0.0–7.0)
Eosinophils Absolute: 0.2 10*3/uL (ref 0.0–0.5)
HCT: 32.6 % — ABNORMAL LOW (ref 34.8–46.6)
HGB: 10.8 g/dL — ABNORMAL LOW (ref 11.6–15.9)
LYMPH#: 2.5 10*3/uL (ref 0.9–3.3)
LYMPH%: 40.1 % (ref 14.0–49.7)
MCH: 30.6 pg (ref 25.1–34.0)
MCHC: 33.2 g/dL (ref 31.5–36.0)
MCV: 92.4 fL (ref 79.5–101.0)
MONO#: 0.8 10*3/uL (ref 0.1–0.9)
MONO%: 12.1 % (ref 0.0–14.0)
NEUT#: 2.7 10*3/uL (ref 1.5–6.5)
NEUT%: 43.7 % (ref 38.4–76.8)
Platelets: 143 10*3/uL — ABNORMAL LOW (ref 145–400)
RBC: 3.52 10*6/uL — AB (ref 3.70–5.45)
RDW: 13.2 % (ref 11.2–14.5)
WBC: 6.2 10*3/uL (ref 3.9–10.3)

## 2015-01-09 MED ORDER — SODIUM CHLORIDE 0.9 % IV SOLN
Freq: Once | INTRAVENOUS | Status: AC
  Administered 2015-01-09: 13:00:00 via INTRAVENOUS
  Filled 2015-01-09: qty 5

## 2015-01-09 MED ORDER — FAMOTIDINE IN NACL 20-0.9 MG/50ML-% IV SOLN
INTRAVENOUS | Status: AC
  Filled 2015-01-09: qty 50

## 2015-01-09 MED ORDER — SODIUM CHLORIDE 0.9 % IJ SOLN
10.0000 mL | INTRAMUSCULAR | Status: DC | PRN
  Administered 2015-01-09: 10 mL
  Filled 2015-01-09: qty 10

## 2015-01-09 MED ORDER — HEPARIN SOD (PORK) LOCK FLUSH 100 UNIT/ML IV SOLN
250.0000 [IU] | Freq: Once | INTRAVENOUS | Status: AC | PRN
  Administered 2015-01-09: 250 [IU]
  Filled 2015-01-09: qty 5

## 2015-01-09 MED ORDER — SODIUM CHLORIDE 0.9 % IV SOLN
1.2500 mg/m2 | Freq: Once | INTRAVENOUS | Status: AC
  Administered 2015-01-09: 1650 ug via INTRAVENOUS
  Filled 2015-01-09: qty 3.3

## 2015-01-09 MED ORDER — PROCHLORPERAZINE EDISYLATE 5 MG/ML IJ SOLN: 10.0000 mg | Freq: Once | INTRAMUSCULAR | Status: DC

## 2015-01-09 MED ORDER — FAMOTIDINE IN NACL 20-0.9 MG/50ML-% IV SOLN
20.0000 mg | Freq: Two times a day (BID) | INTRAVENOUS | Status: DC
Start: 2015-01-09 — End: 2015-01-09
  Administered 2015-01-09: 20 mg via INTRAVENOUS

## 2015-01-09 MED ORDER — HEPARIN SOD (PORK) LOCK FLUSH 100 UNIT/ML IV SOLN
500.0000 [IU] | Freq: Once | INTRAVENOUS | Status: AC
  Administered 2015-01-09: 500 [IU] via INTRAVENOUS
  Filled 2015-01-09: qty 5

## 2015-01-09 MED ORDER — SODIUM CHLORIDE 0.9 % IJ SOLN
10.0000 mL | INTRAMUSCULAR | Status: DC | PRN
  Administered 2015-01-09: 10 mL via INTRAVENOUS
  Filled 2015-01-09: qty 10

## 2015-01-09 MED ORDER — SODIUM CHLORIDE 0.9 % IV SOLN
Freq: Once | INTRAVENOUS | Status: AC
  Administered 2015-01-09: 13:00:00 via INTRAVENOUS

## 2015-01-09 NOTE — Patient Instructions (Signed)
PICC Home Guide A peripherally inserted central catheter (PICC) is a long, thin, flexible tube that is inserted into a vein in the upper arm. It is a form of intravenous (IV) access. It is considered to be a "central" line because the tip of the PICC ends in a large vein in your chest. This large vein is called the superior vena cava (SVC). The PICC tip ends in the SVC because there is a lot of blood flow in the SVC. This allows medicines and IV fluids to be quickly distributed throughout the body. The PICC is inserted using a sterile technique by a specially trained nurse or physician. After the PICC is inserted, a chest X-ray exam is done to be sure it is in the correct place.  A PICC may be placed for different reasons, such as:  To give medicines and liquid nutrition that can only be given through a central line. Examples are:  Certain antibiotic treatments.  Chemotherapy.  Total parenteral nutrition (TPN).  To take frequent blood samples.  To give IV fluids and blood products.  If there is difficulty placing a peripheral intravenous (PIV) catheter. If taken care of properly, a PICC can remain in place for several months. A PICC can also allow a person to go home from the hospital early. Medicine and PICC care can be managed at home by a family member or home health care team. WHAT PROBLEMS CAN HAPPEN WHEN I HAVE A PICC? Problems with a PICC can occasionally occur. These may include the following:  A blood clot (thrombus) forming in or at the tip of the PICC. This can cause the PICC to become clogged. A clot-dissolving medicine called tissue plasminogen activator (tPA) can be given through the PICC to help break up the clot.  Inflammation of the vein (phlebitis) in which the PICC is placed. Signs of inflammation may include redness, pain at the insertion site, red streaks, or being able to feel a "cord" in the vein where the PICC is located.  Infection in the PICC or at the insertion  site. Signs of infection may include fever, chills, redness, swelling, or pus drainage from the PICC insertion site.  PICC movement (malposition). The PICC tip may move from its original position due to excessive physical activity, forceful coughing, sneezing, or vomiting.  A break or cut in the PICC. It is important to not use scissors near the PICC.  Nerve or tendon irritation or injury during PICC insertion. WHAT SHOULD I KEEP IN MIND ABOUT ACTIVITIES WHEN I HAVE A PICC?  You may bend your arm and move it freely. If your PICC is near or at the bend of your elbow, avoid activity with repeated motion at the elbow.  Rest at home for the remainder of the day following PICC line insertion.  Avoid lifting heavy objects as instructed by your health care provider.  Avoid using a crutch with the arm on the same side as your PICC. You may need to use a walker. WHAT SHOULD I KNOW ABOUT MY PICC DRESSING?  Keep your PICC bandage (dressing) clean and dry to prevent infection.  Ask your health care provider when you may shower. Ask your health care provider to teach you how to wrap the PICC when you do take a shower.  Change the PICC dressing as instructed by your health care provider.  Change your PICC dressing if it becomes loose or wet. WHAT SHOULD I KNOW ABOUT PICC CARE?  Check the PICC insertion site   daily for leakage, redness, swelling, or pain.  Do not take a bath, swim, or use hot tubs when you have a PICC. Cover PICC line with clear plastic wrap and tape to keep it dry while showering.  Flush the PICC as directed by your health care provider. Let your health care provider know right away if the PICC is difficult to flush or does not flush. Do not use force to flush the PICC.  Do not use a syringe that is less than 10 mL to flush the PICC.  Never pull or tug on the PICC.  Avoid blood pressure checks on the arm with the PICC.  Keep your PICC identification card with you at all  times.  Do not take the PICC out yourself. Only a trained clinical professional should remove the PICC. SEEK IMMEDIATE MEDICAL CARE IF:  Your PICC is accidentally pulled all the way out. If this happens, cover the insertion site with a bandage or gauze dressing. Do not throw the PICC away. Your health care provider will need to inspect it.  Your PICC was tugged or pulled and has partially come out. Do not  push the PICC back in.  There is any type of drainage, redness, or swelling where the PICC enters the skin.  You cannot flush the PICC, it is difficult to flush, or the PICC leaks around the insertion site when it is flushed.  You hear a "flushing" sound when the PICC is flushed.  You have pain, discomfort, or numbness in your arm, shoulder, or jaw on the same side as the PICC.  You feel your heart "racing" or skipping beats.  You notice a hole or tear in the PICC.  You develop chills or a fever. MAKE SURE YOU:   Understand these instructions.  Will watch your condition.  Will get help right away if you are not doing well or get worse. Document Released: 01/12/2003 Document Revised: 11/22/2013 Document Reviewed: 03/15/2013 ExitCare Patient Information 2015 [employer]. This information is not intended to replace advice given to you by your health care provider. Make sure you discuss any questions you have with your health care provider.  

## 2015-01-09 NOTE — Progress Notes (Signed)
Pt declined AVS.

## 2015-01-09 NOTE — Patient Instructions (Signed)
[place] Discharge Instructions for Patients Receiving Chemotherapy  Today you received the following chemotherapy agents:  Dactinomycin  To help prevent nausea and vomiting after your treatment, we encourage you to take your nausea medication as ordered per MD.   If you develop nausea and vomiting that is not controlled by your nausea medication, call the clinic.   BELOW ARE SYMPTOMS THAT SHOULD BE REPORTED IMMEDIATELY:  *FEVER GREATER THAN 100.5 F  *CHILLS WITH OR WITHOUT FEVER  NAUSEA AND VOMITING THAT IS NOT CONTROLLED WITH YOUR NAUSEA MEDICATION  *UNUSUAL SHORTNESS OF BREATH  *UNUSUAL BRUISING OR BLEEDING  TENDERNESS IN MOUTH AND THROAT WITH OR WITHOUT PRESENCE OF ULCERS  *URINARY PROBLEMS  *BOWEL PROBLEMS  UNUSUAL RASH Items with * indicate a potential emergency and should be followed up as soon as possible.  Feel free to call the clinic you have any questions or concerns. The clinic phone number is [phone].  Please show the [place] at check-in to the Emergency Department and triage nurse.

## 2015-01-10 ENCOUNTER — Ambulatory Visit (HOSPITAL_BASED_OUTPATIENT_CLINIC_OR_DEPARTMENT_OTHER): Payer: Medicaid Other

## 2015-01-10 VITALS — BP 109/65 | HR 107 | Resp 16

## 2015-01-10 DIAGNOSIS — R112 Nausea with vomiting, unspecified: Secondary | ICD-10-CM

## 2015-01-10 DIAGNOSIS — O010 Classical hydatidiform mole: Secondary | ICD-10-CM | POA: Diagnosis not present

## 2015-01-10 DIAGNOSIS — O019 Hydatidiform mole, unspecified: Secondary | ICD-10-CM

## 2015-01-10 DIAGNOSIS — T451X5A Adverse effect of antineoplastic and immunosuppressive drugs, initial encounter: Principal | ICD-10-CM

## 2015-01-10 LAB — HCG, QUANTITATIVE, PREGNANCY: hCG, Beta Chain, Quant, S: <2 m[IU]/mL

## 2015-01-10 MED ORDER — SODIUM CHLORIDE 0.9 % IV SOLN
INTRAVENOUS | Status: DC
  Administered 2015-01-10: 13:00:00 via INTRAVENOUS

## 2015-01-10 MED ORDER — PROCHLORPERAZINE MALEATE 10 MG PO TABS
ORAL_TABLET | ORAL | Status: AC
  Filled 2015-01-10: qty 1

## 2015-01-10 MED ORDER — PROCHLORPERAZINE EDISYLATE 5 MG/ML IJ SOLN
INTRAMUSCULAR | Status: AC
  Filled 2015-01-10: qty 2

## 2015-01-10 MED ORDER — PROCHLORPERAZINE EDISYLATE 5 MG/ML IJ SOLN: 10.0000 mg | Freq: Once | INTRAMUSCULAR | Status: DC | PRN

## 2015-01-10 NOTE — Progress Notes (Signed)
Patient refused Compazine. 1530: PICC removed per MD's order without complications. Wound care and signs and symptoms of complications reviewed with ptient.

## 2015-01-10 NOTE — Patient Instructions (Addendum)
Dehydration, Adult Dehydration is when you lose more fluids from the body than you take in. Vital organs like the kidneys, brain, and heart cannot function without a proper amount of fluids and salt. Any loss of fluids from the body can cause dehydration.  CAUSES   Vomiting.  Diarrhea.  Excessive sweating.  Excessive urine output.  Fever. SYMPTOMS  Mild dehydration  Thirst.  Dry lips.  Slightly dry mouth. Moderate dehydration  Very dry mouth.  Sunken eyes.  Skin does not bounce back quickly when lightly pinched and released.  Dark urine and decreased urine production.  Decreased tear production.  Headache. Severe dehydration  Very dry mouth.  Extreme thirst.  Rapid, weak pulse (more than 100 beats per minute at rest).  Cold hands and feet.  Not able to sweat in spite of heat and temperature.  Rapid breathing.  Blue lips.  Confusion and lethargy.  Difficulty being awakened.  Minimal urine production.  No tears. DIAGNOSIS  Your caregiver will diagnose dehydration based on your symptoms and your exam. Blood and urine tests will help confirm the diagnosis. The diagnostic evaluation should also identify the cause of dehydration. TREATMENT  Treatment of mild or moderate dehydration can often be done at home by increasing the amount of fluids that you drink. It is best to drink small amounts of fluid more often. Drinking too much at one time can make vomiting worse. Refer to the home care instructions below. Severe dehydration needs to be treated at the hospital where you will probably be given intravenous (IV) fluids that contain water and electrolytes. HOME CARE INSTRUCTIONS   Ask your caregiver about specific rehydration instructions.  Drink enough fluids to keep your urine clear or pale yellow.  Drink small amounts frequently if you have nausea and vomiting.  Eat as you normally do.  Avoid:  Foods or drinks high in sugar.  Carbonated  drinks.  Juice.  Extremely hot or cold fluids.  Drinks with caffeine.  Fatty, greasy foods.  Alcohol.  Tobacco.  Overeating.  Gelatin desserts.  Wash your hands well to avoid spreading bacteria and viruses.  Only take over-the-counter or prescription medicines for pain, discomfort, or fever as directed by your caregiver.  Ask your caregiver if you should continue all prescribed and over-the-counter medicines.  Keep all follow-up appointments with your caregiver. SEEK MEDICAL CARE IF:  You have abdominal pain and it increases or stays in one area (localizes).  You have a rash, stiff neck, or severe headache.  You are irritable, sleepy, or difficult to awaken.  You are weak, dizzy, or extremely thirsty. SEEK IMMEDIATE MEDICAL CARE IF:   You are unable to keep fluids down or you get worse despite treatment.  You have frequent episodes of vomiting or diarrhea.  You have blood or green matter (bile) in your vomit.  You have blood in your stool or your stool looks black and tarry.  You have not urinated in 6 to 8 hours, or you have only urinated a small amount of very dark urine.  You have a fever.  You faint. MAKE SURE YOU:   Understand these instructions.  Will watch your condition.  Will get help right away if you are not doing well or get worse. Document Released: 07/08/2005 Document Revised: 09/30/2011 Document Reviewed: 02/25/2011 [employer] Patient Information 2015 [employer]. This information is not intended to replace advice given to you by your health care provider. Make sure you discuss any questions you have with your health care   provider. PICC Removal, Care After  Refer to this sheet in the next few weeks. These instructions provide you with information on caring for yourself after your procedure. Your health care provider may also give you more specific instructions. Your treatment has been planned according to current medical practices, but  problems sometimes occur. Call your health care provider if you have any problems or questions after your procedure. WHAT TO EXPECT AFTER THE PROCEDURE After your procedure, it is typical to have mild discomfort at the insertion site. This should not last for more than a day. HOME CARE INSTRUCTIONS You may remove the bandage after 24 hours. The PICC insertion site is very small. A small scab may develop over the insertion site.  It is okay to wash the site gently with soap and water. Be careful not to remove or pick off the scab. Gently pat the site dry after washing it. You do not need to put another bandage over the insertion site. Do not lift anything heavy or do strenuous physical activity for 24 hours after the PICC is removed. This includes: Weight lifting. Strenuous yard work. Any physical activity with repetitive arm movement.  SEEK MEDICAL CARE IF:  You have swelling or puffiness in your arm at the PICC insertion site. You have increasing tenderness at the PICC insertion site. SEEK IMMEDIATE MEDICAL CARE IF:  You have numbness or tingling in your fingers, hand, or arm. Your arm looks blue and feels cold. You have redness around the insertion site or a red streak goes up your arm. You have any type of drainage from the PICC insertion site. This includes drainage such as: Bleeding from the insertion site. If this happens, apply firm, direct pressure to the PICC insertion site with a clean towel. Drainage that is yellow or tan. You have a fever. Document Released: 07/13/2013 Document Reviewed: 07/13/2013 [facility] Patient Information 2015 [place], [place]. This information is not intended to replace advice given to you by your health care provider. Make sure you discuss any questions you have with your health care provider.

## 2015-01-15 ENCOUNTER — Other Ambulatory Visit: Payer: Self-pay | Admitting: Oncology

## 2015-01-16 ENCOUNTER — Encounter: Payer: Self-pay | Admitting: Oncology

## 2015-01-16 ENCOUNTER — Telehealth: Payer: Self-pay | Admitting: Oncology

## 2015-01-16 ENCOUNTER — Ambulatory Visit (HOSPITAL_BASED_OUTPATIENT_CLINIC_OR_DEPARTMENT_OTHER): Payer: Medicaid Other | Admitting: Oncology

## 2015-01-16 ENCOUNTER — Other Ambulatory Visit (HOSPITAL_BASED_OUTPATIENT_CLINIC_OR_DEPARTMENT_OTHER): Payer: Medicaid Other

## 2015-01-16 VITALS — BP 123/80 | HR 84 | Temp 98.2°F | Resp 18 | Ht <= 58 in | Wt 93.8 lb

## 2015-01-16 DIAGNOSIS — T451X5A Adverse effect of antineoplastic and immunosuppressive drugs, initial encounter: Secondary | ICD-10-CM

## 2015-01-16 DIAGNOSIS — D6481 Anemia due to antineoplastic chemotherapy: Secondary | ICD-10-CM

## 2015-01-16 DIAGNOSIS — K5900 Constipation, unspecified: Secondary | ICD-10-CM

## 2015-01-16 DIAGNOSIS — R634 Abnormal weight loss: Secondary | ICD-10-CM | POA: Diagnosis not present

## 2015-01-16 DIAGNOSIS — R112 Nausea with vomiting, unspecified: Secondary | ICD-10-CM

## 2015-01-16 DIAGNOSIS — O019 Hydatidiform mole, unspecified: Secondary | ICD-10-CM

## 2015-01-16 DIAGNOSIS — O010 Classical hydatidiform mole: Secondary | ICD-10-CM

## 2015-01-16 LAB — CBC WITH DIFFERENTIAL/PLATELET
BASO%: 0.6 % (ref 0.0–2.0)
Basophils Absolute: 0 10*3/uL (ref 0.0–0.1)
EOS%: 3.5 % (ref 0.0–7.0)
Eosinophils Absolute: 0.3 10*3/uL (ref 0.0–0.5)
HCT: 32.7 % — ABNORMAL LOW (ref 34.8–46.6)
HGB: 10.9 g/dL — ABNORMAL LOW (ref 11.6–15.9)
LYMPH%: 35 % (ref 14.0–49.7)
MCH: 31 pg (ref 25.1–34.0)
MCHC: 33.4 g/dL (ref 31.5–36.0)
MCV: 92.7 fL (ref 79.5–101.0)
MONO#: 0.5 10*3/uL (ref 0.1–0.9)
MONO%: 7.2 % (ref 0.0–14.0)
NEUT%: 53.7 % (ref 38.4–76.8)
NEUTROS ABS: 4.1 10*3/uL (ref 1.5–6.5)
Platelets: 186 10*3/uL (ref 145–400)
RBC: 3.53 10*6/uL — ABNORMAL LOW (ref 3.70–5.45)
RDW: 13.2 % (ref 11.2–14.5)
WBC: 7.6 10*3/uL (ref 3.9–10.3)
lymph#: 2.7 10*3/uL (ref 0.9–3.3)

## 2015-01-16 LAB — COMPREHENSIVE METABOLIC PANEL (CC13)
ALBUMIN: 4.2 g/dL (ref 3.5–5.0)
ALK PHOS: 59 U/L (ref 40–150)
ALT: 34 U/L (ref 0–55)
AST: 26 U/L (ref 5–34)
Anion Gap: 8 mEq/L (ref 3–11)
BUN: 6.4 mg/dL — ABNORMAL LOW (ref 7.0–26.0)
CALCIUM: 9.3 mg/dL (ref 8.4–10.4)
CO2: 28 mEq/L (ref 22–29)
Chloride: 104 mEq/L (ref 98–109)
Creatinine: 0.7 mg/dL (ref 0.6–1.1)
GLUCOSE: 122 mg/dL (ref 70–140)
POTASSIUM: 4.1 meq/L (ref 3.5–5.1)
SODIUM: 140 meq/L (ref 136–145)
Total Bilirubin: 0.22 mg/dL (ref 0.20–1.20)
Total Protein: 7.2 g/dL (ref 6.4–8.3)

## 2015-01-16 NOTE — Progress Notes (Signed)
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    Patient had usual nausea and fatigue for most of the week after last treatment, again with IVF on day 2 helpful. She is feeling better this week, with appetite good and pleased that PICC is out. Constipation continues to be an issue, tho hopefully will improve off of chemotherapy. Hemoglobin is better and she will decrease iron to MWF, which may also help constipation. She no longer has LLQ abdominal pain.    PICC placed by IR 11-14-14 for Act D, DCd 01-10-15 Contraception is depoProvera, given 11-10-14 and planned again 01-25-15 by Dr Ihor Dow.  ONCOLOGIC HISTORY Patient had twin pregnancy in 2015, with loss of one of the twins at [redacted] weeks gestation and delivery of full term female on 06-05-14, pathology of placenta with no hydatidiform change. 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 She was seen for lower abdominal pain and vaginal bleeding in ED 07-04-14, with quantitative hCG 389 and pelvic US showing thickened, heterogeneous endometrium with internal blood flow, suspicious for retained products of conception. She was seen by Dr Ihor Dow, with quantitative HCG 1374 on 07-26-14 and 1922 on 07-28-14. [place] 08-02-14 had 1 cm area in central uterus suspicious for retained products of conception, with no gestational sac and 3.3 cm hemorrhagic cyst on left ovary. She had D & C hysteroscopy by Dr Ihor Dow on 08-10-14, uterus 6 week size and very small amount of tissue. Pathology [phone]) was consistent with placental site reaction and retained products of conception. Quantitative hCG 08-25-14 was 4856 and repeat on 09-14-14 was 6736. CT CAP 09-09-14 had no  findings of concern, with a few tiny sub-cm pulmonary nodules on left thought likely post-inflammatory. She was seen by Dr Denman George on 09-14-14, with CT findings reassuring and methotrexate recommended due to further rise in hCG. She had Implanon placed RUE 06-2014 for contraception. TSH on 08-25-14 was 2.176. First methotrexate was 09-20-2014, given weekly x 7 thru 10-31-14. HCG initially dropped, then plateau by week 7. Patient insisted on removal of Implanon on 11-10-14, was started on depoProvera by Dr Ihor Dow then. Restaging scans head CAP 11-10-14 showed no progressive or metastatic disease. Treatment was changed to Actinomycin D beginning 11-14-14.  HCG results: 07-04-14 389 07-26-2014 1374 07-28-2014 1922  D& C 08-10-14 08-25-14 4856 09-14-14 6736 09-20-14 4975 First MTX 09-25-14 4136 09-27-14 3385 Second MTX 10-04-14 2394. Third MTX 10-11-14 2378 Fourth MTX 10-17-14 1928.8 Fifth MTX 10-24-2014 2287 Sixth MTX 10-31-14 2004.7 Seventh MTX 11-11-14 2693.3 11-14-14 3275 First Actinomycin D 11-21-14 1813 11-28-14 277.9Second Actinomycin D 12-01-14 141 12-05-14 77.9 5-23-162.9Third Actinomycin D 12-20-14 <2 12-26-14 <2 Fourth Actinomycin D 01-05-15 <2 01-09-15   <2    Fifth Actinomycin D 01-16-15  <2.0   Review of systems as above, also: No SOB, no new or different pain, bladder ok, no bleeding. No problems at site of PICC. Remainder of 10 point Review of Systems negative.  Objective:  Vital signs in last 24 hours:  BP 123/80 mmHg  Pulse 84  Temp(Src) 98.2 F (36.8 C) (Oral)  Resp 18  Ht $R'4\' 10"'fe$  (1.473 m)  Wt 93 lb 12.8 oz (42.547 kg)  BMI 19.61 kg/m2  SpO2 100% Weight is stable. Alert, oriented  and appropriate. Ambulatory without assistance difficulty.  Alopecia  HEENT:PERRL, sclerae not icteric. Oral mucosa moist without lesions, posterior pharynx clear.  Neck supple. No JVD.  Lymphatics:no supraclavicular adenopathy Resp: clear to  auscultation bilaterally and normal percussion bilaterally Cardio: regular rate and rhythm. No gallop. GI: soft, nontender, not distended, no mass or organomegaly. Normally active bowel sounds. Musculoskeletal/ Extremities: without pitting edema, cords, tenderness Neuro: no peripheral neuropathy. Otherwise nonfocal. PSYCH appropriate mood and affect Skin without rash, ecchymosis, petechiae. Insertion site for PICC left upper arm nontender and without erythema   Lab Results:  Results for orders placed or performed in visit on 01/16/15  CBC with Differential  Result Value Ref Range   WBC 7.6 3.9 - 10.3 10e3/uL   NEUT# 4.1 1.5 - 6.5 10e3/uL   HGB 10.9 (L) 11.6 - 15.9 g/dL   HCT 32.7 (L) 34.8 - 46.6 %   Platelets 186 145 - 400 10e3/uL   MCV 92.7 79.5 - 101.0 fL   MCH 31.0 25.1 - 34.0 pg   MCHC 33.4 31.5 - 36.0 g/dL   RBC 3.53 (L) 3.70 - 5.45 10e6/uL   RDW 13.2 11.2 - 14.5 %   lymph# 2.7 0.9 - 3.3 10e3/uL   MONO# 0.5 0.1 - 0.9 10e3/uL   Eosinophils Absolute 0.3 0.0 - 0.5 10e3/uL   Basophils Absolute 0.0 0.0 - 0.1 10e3/uL   NEUT% 53.7 38.4 - 76.8 %   LYMPH% 35.0 14.0 - 49.7 %   MONO% 7.2 0.0 - 14.0 %   EOS% 3.5 0.0 - 7.0 %   BASO% 0.6 0.0 - 2.0 %  Comprehensive metabolic panel (Cmet) - [facility]  Result Value Ref Range   Sodium 140 136 - 145 mEq/L   Potassium 4.1 3.5 - 5.1 mEq/L   Chloride 104 98 - 109 mEq/L   CO2 28 22 - 29 mEq/L   Glucose 122 70 - 140 mg/dl   BUN 6.4 (L) 7.0 - 26.0 mg/dL   Creatinine 0.7 0.6 - 1.1 mg/dL   Total Bilirubin 0.22 0.20 - 1.20 mg/dL   Alkaline Phosphatase 59 40 - 150 U/L   AST 26 5 - 34 U/L   ALT 34 0 - 55 U/L   Total Protein 7.2 6.4 - 8.3 g/dL   Albumin 4.2 3.5 - 5.0 g/dL   Calcium 9.3 8.4 - 10.4 mg/dL   Anion Gap 8 3 - 11 mEq/L   EGFR >90 >90 ml/min/1.73 m2   HCG available after visit <2.0  Studies/Results:  No results found.  Medications: I have reviewed the patient's current medications.  DISCUSSION: Patient is planning a trip to [place]  for several weeks, but understands that she needs to have HCG followed regularly; as she has [place] Medicaid, lobs cannot be done out of state. She is in agreement with having marker checked on 02-02-15, then will go to [place] from 02-04-15 until about 02-25-15. She knows appointment for depoProvera on 01-25-15,    Assessment/Plan:  1.GTN following full term pregnancy/ loss of one twin at 10 weeks, in otherwise healthy 24 yo lady. Had 7 cycles weekly MTX, marker plateau'd and restaging scans without evidence of metastatic disease. Actinomycin D 1.25 mg/m2 begun 11-14-14, with HCG normal day of cycle 3 12-12-14. Has now had total of 3 cycles beyond normal value, which will be thru cycle 5 on 01-09-15. She will see Dr Denman George with HCG on 02-02-15, then surveillance with HCG monthly x 1 year. 2.PICC  DCd 01-10-15 3.Contraception necessary for a full year  after completing GTN treatment, during ongoing monitoring of HCG marker then. Patient insisted that Nexplanon be removed on 11-10-14, given depoProvera that day, due next depoProvera due July 7 4.sub-cm scattered pulmonary nodules left lung not thought likely related to GTN at time of initial imaging and stable on restaging scans 11-10-14. 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 Pyelonephritis 2014, resolved with oral antibiotics  7. Chemo anemia: hgb stable at 10.9. Will continue iron 3 days weekly and follow. 8.underweight due to inadequate caloric intake: improving 9. Constipation since after pregnancy, worse with chemo. Bowels are moving with 3 medications regularly. Hopefully this issue will resolve off of chemo    All questions answered. Patient and boyfriend express appreciation for care. Appointment with Dr Denman George moved per gyn onc to accommodate patient's trip out of state. I will see her back coordinating with lab in ~ Sept. Time spent 25 min including >50% counseling and coordination of  care.   Shelly Koch P, MD   01/16/2015, 11:41 AM

## 2015-01-16 NOTE — Telephone Encounter (Signed)
Gave avs & calendar for July thru December.

## 2015-01-17 ENCOUNTER — Encounter: Payer: Self-pay | Admitting: Oncology

## 2015-01-17 ENCOUNTER — Telehealth: Payer: Self-pay

## 2015-01-17 LAB — HCG, QUANTITATIVE, PREGNANCY: hCG, Beta Chain, Quant, S: <2 m[IU]/mL

## 2015-01-17 NOTE — Telephone Encounter (Signed)
-----   Message from Gordy Levan, MD sent at 01/17/2015  8:29 AM EDT ----- Labs seen and need follow up: please let her know HCG marker is still good, very low. RN please be sure we let her know marker results each month (for 1 year); she will have MD visits also, but not monthly.  thanks

## 2015-01-17 NOTE — Progress Notes (Signed)
[place] END OF TREATMENT   Name: Shelly Koch Date: January 17, 2015  MRN: [id number] DOB: Dec 12, 1990   TREATMENT DATES: 11-14-14 thru 01-09-15  REFERRING PHYSICIAN: Everitt Amber  DIAGNOSIS: gestational trophoblastic disease  STAGE AT START OF TREATMENT: nonmetastatic   INTENT: curative   DRUGS OR REGIMENS GIVEN: actinomycin D   MAJOR TOXICITIES: nausea, fatigue, anemia   REASON TREATMENT STOPPED: completion of planned course   PERFORMANCE STATUS AT END: 0  ONGOING PROBLEMS: anemia   FOLLOW UP PLANS: monitor HCG x 1 year. Contraception x 1 year. Follow up gyn oncology, gyn, medical oncology

## 2015-01-17 NOTE — Telephone Encounter (Signed)
Told Ms.Tippen the result of the HCG marker as noted below by Dr. Marko Plume.

## 2015-01-25 ENCOUNTER — Ambulatory Visit (INDEPENDENT_AMBULATORY_CARE_PROVIDER_SITE_OTHER): Payer: Medicaid Other | Admitting: *Deleted

## 2015-01-25 VITALS — BP 108/73 | HR 79

## 2015-01-25 DIAGNOSIS — Z3042 Encounter for surveillance of injectable contraceptive: Secondary | ICD-10-CM

## 2015-02-02 ENCOUNTER — Telehealth: Payer: Self-pay | Admitting: Nurse Practitioner

## 2015-02-02 ENCOUNTER — Other Ambulatory Visit (HOSPITAL_BASED_OUTPATIENT_CLINIC_OR_DEPARTMENT_OTHER): Payer: Medicaid Other

## 2015-02-02 ENCOUNTER — Ambulatory Visit: Payer: Medicaid Other | Attending: Gynecologic Oncology | Admitting: Gynecologic Oncology

## 2015-02-02 ENCOUNTER — Encounter: Payer: Self-pay | Admitting: Gynecologic Oncology

## 2015-02-02 ENCOUNTER — Other Ambulatory Visit (HOSPITAL_COMMUNITY)
Admission: RE | Admit: 2015-02-02 | Discharge: 2015-02-02 | Disposition: A | Discharge status: Home / Self Care | Payer: Medicaid Other | Source: Ambulatory Visit | Attending: Oncology | Admitting: Oncology

## 2015-02-02 VITALS — BP 123/71 | HR 78 | Temp 98.0°F | Resp 18 | Ht <= 58 in | Wt 93.2 lb

## 2015-02-02 DIAGNOSIS — D649 Anemia, unspecified: Secondary | ICD-10-CM | POA: Insufficient documentation

## 2015-02-02 DIAGNOSIS — O010 Classical hydatidiform mole: Secondary | ICD-10-CM

## 2015-02-02 DIAGNOSIS — O019 Hydatidiform mole, unspecified: Secondary | ICD-10-CM

## 2015-02-02 LAB — COMPREHENSIVE METABOLIC PANEL (CC13)
ALK PHOS: 55 U/L (ref 40–150)
ALT: 15 U/L (ref 0–55)
AST: 19 U/L (ref 5–34)
Albumin: 4.3 g/dL (ref 3.5–5.0)
Anion Gap: 7 mEq/L (ref 3–11)
BUN: 6.7 mg/dL — AB (ref 7.0–26.0)
CALCIUM: 9.7 mg/dL (ref 8.4–10.4)
CO2: 24 meq/L (ref 22–29)
CREATININE: 0.7 mg/dL (ref 0.6–1.1)
Chloride: 108 mEq/L (ref 98–109)
EGFR: >90 mL/min/{1.73_m2} (ref >90)
GLUCOSE: 110 mg/dL (ref 70–140)
Potassium: 4.2 mEq/L (ref 3.5–5.1)
Sodium: 139 mEq/L (ref 136–145)
TOTAL PROTEIN: 7.3 g/dL (ref 6.4–8.3)
Total Bilirubin: 0.55 mg/dL (ref 0.20–1.20)

## 2015-02-02 LAB — CBC WITH DIFFERENTIAL/PLATELET
BASO%: 0.2 % (ref 0.0–2.0)
Basophils Absolute: 0 10*3/uL (ref 0.0–0.1)
EOS%: 2.6 % (ref 0.0–7.0)
Eosinophils Absolute: 0.2 10*3/uL (ref 0.0–0.5)
HEMATOCRIT: 36.3 % (ref 34.8–46.6)
HGB: 11.8 g/dL (ref 11.6–15.9)
LYMPH%: 44 % (ref 14.0–49.7)
MCH: 30.3 pg (ref 25.1–34.0)
MCHC: 32.5 g/dL (ref 31.5–36.0)
MCV: 93.3 fL (ref 79.5–101.0)
MONO#: 0.6 10*3/uL (ref 0.1–0.9)
MONO%: 9.2 % (ref 0.0–14.0)
NEUT%: 44 % (ref 38.4–76.8)
NEUTROS ABS: 2.9 10*3/uL (ref 1.5–6.5)
Platelets: 205 10*3/uL (ref 145–400)
RBC: 3.89 10*6/uL (ref 3.70–5.45)
RDW: 12.5 % (ref 11.2–14.5)
WBC: 6.6 10*3/uL (ref 3.9–10.3)
lymph#: 2.9 10*3/uL (ref 0.9–3.3)

## 2015-02-02 LAB — HCG, QUANTITATIVE, PREGNANCY: hCG, Beta Chain, Quant, S: <1 m[IU]/mL (ref <5)

## 2015-02-02 NOTE — Telephone Encounter (Signed)
Patient notified of results below per Dr. Marko Plume. She verbalizes understanding and is aware to call clinic with any needs or questions.

## 2015-02-02 NOTE — Progress Notes (Signed)
Followup Note: Gyn-Onc  Consult was initially requested by Dr. Ihor Dow for the evaluation of Shelly Koch 24 y.o. female with rising HCG levels postpartum  CC:  Chief Complaint  Patient presents with  . Gestational trophoblastic neoplasm    Assessment/Plan:  Shelly Koch  is a 24 y.o.  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 Complete response with second line therapy (Act-D) after initial failure to MTX.  I discussed with Shelly Koch that it is important to follow monthly hCG levels for 1 year to monitor for recurrence of the GTN which would necessitate restaging and reinstitution of chemotherapy. I discussed during this time. It is imperative that she not get pregnant as pregnancy would Murvin Donning An elevation in hCG course by recurrence. She is planning to continue using Depo-Provera for birth control. Her last upper Provera shot was 01/25/2015. She received these from Dr. Purvis Kilts.  She is going to [place] to visit her family tomorrow. She'll be there for approximately one month, and will follow-up with Dr. Marko Koch on 02/10/2015. I believe is resulting wait until that period of time until she has her next hCG draw (provided today's is normal). We discussed symptoms of recurrence particularly new into her menstrual bleeding or new abdominal pain both of which she should notify us.  I will see her back in 3 months for surveillance.  HPI: Shelly Koch is a 24 year old gravida 1 para 1 who is 3 months status post delivery of a full-term female infant on 06/05/2014. This pregnancy was complicated by a twin pregnancy with loss of the second twin at a proximally [redacted] weeks gestational age. She also had gestational hypertension during this pregnancy. Her delivery was vaginal and per patient was uncomplicated with no retained placenta in the third stage of labor.  In December 2015 when she was one month postpartum she was experiencing  abdominal pain and was seen in the emergency room for this. HCG levels were drawn at this time and was elevated.   HCG 07/04/14: 389  She was recommended to follow-up with Dr. Purvis Kilts for this elevated hCG and abdominal pain and she did so. In January she sore Dr. Purvis Kilts to repeated the hCG demonstrating that it had increased substantially. HCG 07/26/14: 5621 HCG 07/30/14: 1922   To follow-up these rises in hCG she also underwent ultrasound scan. Ultrasound 08/02/14:  No intrauterine gestational sac identified. A focal hyperechoic area is seen within the central endometrium which measures 1.1 x 0.5 x 0.8 cm. This has decreased since prior study, but is suspicious for a persistent small focus of retained products of conception. No myometrial masses are identified.  The right ovary is normal in appearance. A complex cystic lesion is again seen in the left ovary which contains a fine reticular pattern of internal echoes, without blood flow on color Doppler ultrasound. This measures 3.3 x 2.5 x 2.7 cm, and has findings consistent with a benign hemorrhagic cyst.   It was felt that the elevated hCG and ultrasound findings were representative retained products of conception. Therefore she underwent a D&C hysteroscopy 08/10/14: 6 week size uterus with small amount of tissue in cavity.  Postprocedure hCG levels were drawn and were surprisingly double what they had been 1 month prior. HCG 08/25/14: 4856  To follow-up these doubling hCG levels she underwent CT evaluation of the chest abdomen and pelvis to look for metastatic foci of trophoblastic tissue. CT chest/abdo/pelvis 09/09/14 : A few  tiny sub-cm pulmonary nodules are seen scattered in the left upper and lower lobes, most likely postinflammatory in etiology in patient of this age. Normal/unremarkable uterus tubes and ovaries. No evidence for metastatic disease.  Interval History: The patient was referred to Dr. Marko Koch for  chemotherapy after she experiences plateauing in the fourth for hCGs. 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 She was treated initially with single agent methotrexate, weekly (first cycle 09/20/14). In April 2016 after receiving her seventh dose of methotrexate her hCG levels plateaued at 2693 on 11/11/14. This was an elevation from 2004.7 the week previously on 10/31/2014. She underwent restaging CT of the chest abdomen and pelvis on 11/10/2014 which showed no progressive or metastatic disease. Her chemotherapy regimen was changed to actinomycin D beginning on 11/14/2014 with hCG levels at 3275 on cycle 1 of active. She received a total of 5 cycles of active with the fifth being administered on 01/09/2015 after she had experienced 3 normal (less than 2) hCG levels prior to this. On 01/16/2015 1 week after her final cycle of active her hCG continued to be normal at less than 2 representing the fifth value in this range.  She states that she is continuing to have some ongoing hair loss. She otherwise has no lingering toxicities from chemotherapy. She is continuing to menstruate with her last Mr. On 01/16/2015.   Current Meds:  Outpatient Encounter Prescriptions as of 02/02/2015  Medication Sig  . ferrous fumarate (HEMOCYTE - 106 MG FE) 325 (106 FE) MG TABS tablet Take daily on an empty stomach with orange juice  . polyethylene glycol powder (GLYCOLAX/MIRALAX) powder Take 17 g by mouth daily.  . sennosides-docusate sodium (SENOKOT-S) 8.6-50 MG tablet Take 1 tablet by mouth 2 (two) times daily. (Patient taking differently: Take 2 tablets by mouth 2 (two) times daily. )  . LORazepam (ATIVAN) 0.5 MG tablet Place 1 tablet under the tongue or swallow every 4 hrs as needed for nausea. Will make you drowsy. (Patient not taking: Reported on 12/08/2014)  . omeprazole (PRILOSEC) 20 MG capsule Take 20 mg by mouth as needed.   . prochlorperazine (COMPAZINE)  10 MG tablet Take 1 tablet (10 mg total) by mouth every 6 (six) hours as needed for nausea or vomiting. (Patient not taking: Reported on 01/16/2015)   Facility-Administered Encounter Medications as of 02/02/2015  Medication  . heparin lock flush 100 unit/mL  . medroxyPROGESTERone (DEPO-PROVERA) injection 150 mg  . sodium chloride 0.9 % injection 10 mL    Allergy:  Allergies  Allergen Reactions  . Ginger Hives and Itching    Pt states "not allergic"  . [place]     Hives and itching  . Ondansetron Hives and Itching    Social Hx:   History   Social History  . Marital Status: Single    Spouse Name: N/A  . Number of Children: N/A  . Years of Education: N/A   Occupational History  . Not on file.   Social History Main Topics  . Smoking status: Never Smoker   . Smokeless tobacco: Never Used  . Alcohol Use: No  . Drug Use: No  . Sexual Activity:    Partners: Male    Birth Control/ Protection: None     Comment: Stopped Depo in November; trying for pregnancy   Other Topics Concern  . Not on file   Social History Narrative    Past Surgical Hx:  Past Surgical History  Procedure  Laterality Date  . No past surgeries    . Dilation and evacuation N/A 08/10/2014    Procedure: DILATATION AND EVACUATION;  Surgeon: Lavonia Drafts, MD;  Location: [facility];  Service: Gynecology;  Laterality: N/A;  . Hysteroscopy N/A 08/10/2014    Procedure: HYSTEROSCOPY;  Surgeon: Lavonia Drafts, MD;  Location: [facility];  Service: Gynecology;  Laterality: N/A;    Past Medical Hx:  Past Medical History  Diagnosis Date  . Pyelonephritis     Past Gynecological History:  SVD x 1 (06/05/14)  No LMP recorded. Patient is not currently having periods (Reason: Oral contraceptives).  Family Hx:  Family History  Problem Relation Age of Onset  . Alcohol abuse Neg Hx     Review of Systems:  Constitutional  Feels well,    ENT Normal appearing ears and nares bilaterally Skin/Breast  No  rash, sores, jaundice, itching, dryness Cardiovascular  No chest pain, shortness of breath, or edema  Pulmonary  No cough or wheeze.  Gastro Intestinal  No nausea, vomitting, or diarrhoea. No bright red blood per rectum, + abdominal pain, no change in bowel movement, +constipation.  Genito Urinary  No frequency, urgency, dysuria,  Musculo Skeletal  No myalgia, arthralgia, joint swelling or pain  Neurologic  No weakness, numbness, change in gait,  Psychology  No depression, anxiety, insomnia.   Vitals:  Blood pressure 123/71, pulse 78, temperature 98 F (36.7 C), temperature source Oral, resp. rate 18, height 4\' 10"  (1.473 m), weight 93 lb 3.2 oz (42.275 kg), SpO2 100 %, not currently breastfeeding.  Physical Exam: WD in NAD Neck  Supple NROM, without any enlargements.  Lymph Node Survey No cervical supraclavicular or inguinal adenopathy Cardiovascular  Pulse normal rate, regularity and rhythm. S1 and S2 normal.  Lungs  Clear to auscultation bilateraly, without wheezes/crackles/rhonchi. Good air movement.  Skin  No rash/lesions/breakdown  Psychiatry  Alert and oriented to person, place, and time  Abdomen  Normoactive bowel sounds, abdomen soft, non-tender and thin without evidence of hernia.  Back No CVA tenderness Genito Urinary  Vulva/vagina: Normal external female genitalia.  No lesions. No discharge or bleeding.  Bladder/urethra:  No lesions or masses, well supported bladder  Vagina: no lesions visible  Cervix: Normal appearing, no lesions.  Uterus:  Small, mobile, no parametrial involvement or nodularity.  Adnexa: no palpable masses. Rectal  Good tone, no masses no cul de sac nodularity.  Extremities  No bilateral cyanosis, clubbing or edema.   Donaciano Eva, MD   02/02/2015, 9:56 AM  Cc: Dr Shelly Koch

## 2015-02-02 NOTE — Telephone Encounter (Signed)
-----   Message from Gordy Levan, MD sent at 02/02/2015 10:49 AM EDT ----- Labs seen and need follow up: please let her know HCG in good low range. Hope she has a good time visiting her family

## 2015-02-02 NOTE — Patient Instructions (Signed)
Followup with Dr. Marko Plume 04/10/15 and Dr. Denman George 05/08/15. Please call our office sooner with any questions or concerns.

## 2015-02-06 ENCOUNTER — Other Ambulatory Visit: Payer: Self-pay | Admitting: Oncology

## 2015-02-13 ENCOUNTER — Other Ambulatory Visit: Payer: Medicaid Other

## 2015-02-13 ENCOUNTER — Ambulatory Visit: Payer: Medicaid Other | Admitting: Gynecologic Oncology

## 2015-02-16 ENCOUNTER — Telehealth: Payer: Self-pay | Admitting: Oncology

## 2015-02-16 NOTE — Telephone Encounter (Signed)
Spoke with patient and she is aware of her new appointment time with dr Lonna Cobb

## 2015-03-13 ENCOUNTER — Other Ambulatory Visit (HOSPITAL_BASED_OUTPATIENT_CLINIC_OR_DEPARTMENT_OTHER): Payer: Medicaid Other

## 2015-03-13 DIAGNOSIS — O019 Hydatidiform mole, unspecified: Secondary | ICD-10-CM

## 2015-03-13 DIAGNOSIS — O010 Classical hydatidiform mole: Secondary | ICD-10-CM

## 2015-03-13 LAB — COMPREHENSIVE METABOLIC PANEL (CC13)
ALBUMIN: 4.4 g/dL (ref 3.5–5.0)
ALK PHOS: 52 U/L (ref 40–150)
ALT: 21 U/L (ref 0–55)
AST: 22 U/L (ref 5–34)
Anion Gap: 10 mEq/L (ref 3–11)
BILIRUBIN TOTAL: 0.7 mg/dL (ref 0.20–1.20)
BUN: 10.3 mg/dL (ref 7.0–26.0)
CO2: 22 mEq/L (ref 22–29)
Calcium: 9.7 mg/dL (ref 8.4–10.4)
Chloride: 108 mEq/L (ref 98–109)
Creatinine: 0.7 mg/dL (ref 0.6–1.1)
EGFR: >90 mL/min/{1.73_m2} (ref >90)
Glucose: 115 mg/dl (ref 70–140)
Potassium: 3.8 mEq/L (ref 3.5–5.1)
Sodium: 140 mEq/L (ref 136–145)
TOTAL PROTEIN: 7.3 g/dL (ref 6.4–8.3)

## 2015-03-13 LAB — CBC WITH DIFFERENTIAL/PLATELET
BASO%: 0.6 % (ref 0.0–2.0)
Basophils Absolute: 0 10*3/uL (ref 0.0–0.1)
EOS%: 2.1 % (ref 0.0–7.0)
Eosinophils Absolute: 0.1 10*3/uL (ref 0.0–0.5)
HCT: 36.2 % (ref 34.8–46.6)
HEMOGLOBIN: 11.9 g/dL (ref 11.6–15.9)
LYMPH%: 43.5 % (ref 14.0–49.7)
MCH: 29.7 pg (ref 25.1–34.0)
MCHC: 32.9 g/dL (ref 31.5–36.0)
MCV: 90.3 fL (ref 79.5–101.0)
MONO#: 0.5 10*3/uL (ref 0.1–0.9)
MONO%: 8.1 % (ref 0.0–14.0)
NEUT#: 2.9 10*3/uL (ref 1.5–6.5)
NEUT%: 45.7 % (ref 38.4–76.8)
Platelets: 135 10*3/uL — ABNORMAL LOW (ref 145–400)
RBC: 4 10*6/uL (ref 3.70–5.45)
RDW: 13.3 % (ref 11.2–14.5)
WBC: 6.4 10*3/uL (ref 3.9–10.3)
lymph#: 2.8 10*3/uL (ref 0.9–3.3)

## 2015-03-14 ENCOUNTER — Other Ambulatory Visit: Payer: Self-pay | Admitting: Oncology

## 2015-03-14 ENCOUNTER — Telehealth: Payer: Self-pay

## 2015-03-14 DIAGNOSIS — O019 Hydatidiform mole, unspecified: Secondary | ICD-10-CM

## 2015-03-14 LAB — HCG, QUANTITATIVE, PREGNANCY: hCG, Beta Chain, Quant, S: <2 m[IU]/mL

## 2015-03-14 NOTE — Telephone Encounter (Signed)
-----   Message from Gordy Levan, MD sent at 03/14/2015  7:53 AM EDT ----- Labs seen and need follow up: please let her know HCG in good low range at <2. May be that she has just had cold/ other viral illness recently with platelets slightly low, & I will recheck with next labs.

## 2015-03-14 NOTE — Telephone Encounter (Signed)
Discussed the results of the labs with Shelly Koch as noted below by Dr. Marko Plume.  Shelly Koch  Stated that she has not had a cold recently.  Denies any bleeding or brusing.

## 2015-03-29 ENCOUNTER — Telehealth: Payer: Self-pay | Admitting: Internal Medicine

## 2015-03-29 NOTE — Telephone Encounter (Signed)
Pt called states she wants to come here to get her Depo now instead of her obgyn office. Is this okay.

## 2015-03-29 NOTE — Telephone Encounter (Signed)
I would have her come in for physical or general recheck.   Given her diagnoses, it may still be wise to c/t gynecology care at this time, but she can come in and discuss.  At the very least she needs routine primary care f/u here.

## 2015-03-30 NOTE — Telephone Encounter (Signed)
Shelly Koch, I called patient and she said she had physical elsewhere in April. I told her she needed a general recheck and could get Depo at the same time. I made her an appt for Tuesday.

## 2015-04-04 ENCOUNTER — Ambulatory Visit (INDEPENDENT_AMBULATORY_CARE_PROVIDER_SITE_OTHER): Payer: Medicaid Other | Admitting: Medical

## 2015-04-04 ENCOUNTER — Encounter: Payer: Self-pay | Admitting: Medical

## 2015-04-04 VITALS — BP 110/70 | HR 80 | Temp 98.8°F | Wt 94.2 lb

## 2015-04-04 DIAGNOSIS — Z23 Encounter for immunization: Secondary | ICD-10-CM

## 2015-04-04 DIAGNOSIS — O010 Classical hydatidiform mole: Secondary | ICD-10-CM | POA: Diagnosis not present

## 2015-04-04 DIAGNOSIS — Z30013 Encounter for initial prescription of injectable contraceptive: Secondary | ICD-10-CM | POA: Diagnosis not present

## 2015-04-04 DIAGNOSIS — O019 Hydatidiform mole, unspecified: Secondary | ICD-10-CM

## 2015-04-04 DIAGNOSIS — R1032 Left lower quadrant pain: Secondary | ICD-10-CM

## 2015-04-04 DIAGNOSIS — Z3040 Encounter for surveillance of contraceptives, unspecified: Secondary | ICD-10-CM | POA: Diagnosis not present

## 2015-04-04 MED ORDER — MEDROXYPROGESTERONE ACETATE 150 MG/ML IM SUSP
150.0000 mg | Freq: Once | INTRAMUSCULAR | Status: AC
  Administered 2015-04-04: 150 mg via INTRAMUSCULAR

## 2015-04-04 NOTE — Progress Notes (Signed)
Subjective:   Shelly Koch is a 24 y.o. female presenting on 04/04/2015 with Contraception and Follow-up  She is here to c/t Depo Provera.   She relies on pulbic transportation, and going to her regular gynecology office is not a very feasible option for her currently.   Since her last visit here she had gotten pregnant then subsequently was diagnosed with gestational trophoblastic neoplasm.   She has had radiation and chemotherapy.   Last chem was in June 2016.   Her baby is doing well and she reports no problem currently.  During her treatment she had lost down to 89 lb but her normal is around her current weight.  She has never been over 96 lb.  She has no new c/o other than continuing depo provera.   Of note, she reports having updated pap smear 06/2014 by Dr. Ihor Dow although we don't have record of this.   Last pap reviewed was from 11/2013. She is in a monogamous relationship, and no concern for STD, no current vaginal c/o, no current urinary c/o, no recent fever, weight loss, nnightsweat, no c/o in general.  Her medical team includes: Dr. Evlyn Clines, medical oncology Dr. Everitt Amber, gynecology oncology Has been seeing Dr. Lavonia Drafts gynecology   Review of Systems ROS as in subjective    Objective: BP 110/70 mmHg  Pulse 80  Temp(Src) 98.8 F (37.1 C) (Oral)  Wt 94 lb 3.2 oz (42.729 kg)  General appearance: alert, no distress, WD/WN HEENT: normocephalic, sclerae anicteric, TMs pearly, nares patent, no discharge or erythema, pharynx normal Oral cavity: MMM, no lesions Neck: supple, no lymphadenopathy, no thyromegaly, no masses Heart: RRR, normal S1, S2, no murmurs Lungs: CTA bilaterally, no wheezes, rhonchi, or rales Abdomen: +bs, soft, slight LLQ tenderness but this is chronic for over a year, otherwise non tender, non distended, no masses, no hepatomegaly, no splenomegaly Pulses: 2+ symmetric, upper and lower extremities, normal cap refill Back:  nontender Skin: no obvious worrisome lesion Ext: no edema      Assessment: Encounter Diagnoses  Name Primary?  . Encounter for initial prescription of injectable contraceptive Yes  . Need for prophylactic vaccination and inoculation against influenza   . Gestational trophoblastic neoplasm   . LLQ abdominal pain      Plan: Contraception - discussed risks/benefits of contraception, discussed possible complications or side effects.  Reviewed prior gyn and oncology gynecology records.   Depo Provera given today IM, and she was given specific date to return for next Depo Provera injection.   Will request copy of pap from 12/ 2015.   She denies any current symptoms, actually feels good overall.    Counseled on the influenza virus vaccine.  Vaccine information sheet given.  Influenza vaccine given after consent obtained.  Gestational trophoblastic neoplasm - she will c/t routine f/u with medical and gynecological oncology.  Reviewed extensive prior records from the past year, and read Dr. Serita Grit 02/02/15 note for latest update on her treatment and plan.  LLQ pain - intermittent ongoing x 1 + year per patient.   [place] was seen today for contraception and follow-up.  Diagnoses and all orders for this visit:  Encounter for initial prescription of injectable contraceptive -     medroxyPROGESTERone (DEPO-PROVERA) injection 150 mg; Inject 1 mL (150 mg total) into the muscle once.  Need for prophylactic vaccination and inoculation against influenza -     Flu Vaccine QUAD 36+ mos PF IM (Fluarix & Fluzone Quad PF)  Gestational trophoblastic  neoplasm  LLQ abdominal pain   Return pending labs.

## 2015-04-05 DIAGNOSIS — Z23 Encounter for immunization: Secondary | ICD-10-CM | POA: Insufficient documentation

## 2015-04-09 ENCOUNTER — Other Ambulatory Visit: Payer: Self-pay | Admitting: Oncology

## 2015-04-10 ENCOUNTER — Other Ambulatory Visit: Payer: Medicaid Other

## 2015-04-10 ENCOUNTER — Ambulatory Visit: Payer: Medicaid Other | Admitting: Oncology

## 2015-04-10 DIAGNOSIS — O019 Hydatidiform mole, unspecified: Secondary | ICD-10-CM

## 2015-04-12 ENCOUNTER — Ambulatory Visit: Payer: Medicaid Other

## 2015-04-17 ENCOUNTER — Ambulatory Visit (HOSPITAL_BASED_OUTPATIENT_CLINIC_OR_DEPARTMENT_OTHER): Payer: Medicaid Other | Admitting: Oncology

## 2015-04-17 ENCOUNTER — Other Ambulatory Visit: Payer: Self-pay | Admitting: Oncology

## 2015-04-17 ENCOUNTER — Encounter: Payer: Self-pay | Admitting: Oncology

## 2015-04-17 ENCOUNTER — Ambulatory Visit (HOSPITAL_BASED_OUTPATIENT_CLINIC_OR_DEPARTMENT_OTHER): Payer: Medicaid Other

## 2015-04-17 VITALS — BP 121/70 | HR 86 | Temp 98.2°F | Resp 18 | Ht <= 58 in | Wt 93.4 lb

## 2015-04-17 DIAGNOSIS — O019 Hydatidiform mole, unspecified: Secondary | ICD-10-CM

## 2015-04-17 DIAGNOSIS — O010 Classical hydatidiform mole: Secondary | ICD-10-CM | POA: Diagnosis not present

## 2015-04-17 DIAGNOSIS — D696 Thrombocytopenia, unspecified: Secondary | ICD-10-CM

## 2015-04-17 LAB — CBC WITH DIFFERENTIAL/PLATELET
BASO%: 0.2 % (ref 0.0–2.0)
Basophils Absolute: 0 10*3/uL (ref 0.0–0.1)
EOS ABS: 0.2 10*3/uL (ref 0.0–0.5)
EOS%: 3.7 % (ref 0.0–7.0)
HEMATOCRIT: 39 % (ref 34.8–46.6)
HEMOGLOBIN: 12.8 g/dL (ref 11.6–15.9)
LYMPH#: 2.6 10*3/uL (ref 0.9–3.3)
LYMPH%: 42.4 % (ref 14.0–49.7)
MCH: 29.8 pg (ref 25.1–34.0)
MCHC: 32.9 g/dL (ref 31.5–36.0)
MCV: 90.7 fL (ref 79.5–101.0)
MONO#: 0.4 10*3/uL (ref 0.1–0.9)
MONO%: 6.6 % (ref 0.0–14.0)
NEUT%: 47.1 % (ref 38.4–76.8)
NEUTROS ABS: 2.8 10*3/uL (ref 1.5–6.5)
PLATELETS: 144 10*3/uL — AB (ref 145–400)
RBC: 4.3 10*6/uL (ref 3.70–5.45)
RDW: 13.6 % (ref 11.2–14.5)
WBC: 6 10*3/uL (ref 3.9–10.3)

## 2015-04-17 NOTE — Progress Notes (Signed)
OFFICE PROGRESS NOTE   April 17, 2015   Physicians: Juel Burrow, MD, Lavonia Drafts  INTERVAL HISTORY:   Patient is seen, alone for visit, in continuing follow up of previously treated post pregnancy GTD, complete response to second line Actinomycin D after initial failure of MTX. She had last Actinomycin D on 01-09-15.  Patient saw Dr Denman George on 02-02-15 and will see her again in Oct.  Contraception is depoProvera, [facility] at [facility]  She had slightly low platelet count on labs in August. Labs including hCG were not drawn correctly last week, will be done today after MD visit.   Patient reports that she is feeling very well, with no complaints at all. Bowels are moving regularly with no laxatives. She has no abdominal or pelvic discomfort. Energy and appetite are fine, and she is able to care for her toddler without difficulty now. She has no symptoms residual from chemotherapy that she can tell. She has had no bleeding and no bruising. Bladder fine.   PICC used for chemo was DCd Flu vaccine done 04-04-15.   ONCOLOGIC HISTORY Patient had twin pregnancy in 2015, with loss of one of the twins at [redacted] weeks gestation and delivery of full term female on 06-05-14, pathology of placenta with no hydatidiform change. 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 She was seen for lower abdominal pain and vaginal bleeding in ED 07-04-14, with quantitative hCG 389 and pelvic US showing thickened, heterogeneous endometrium with internal blood flow, suspicious for retained products of conception. She was seen by Dr Ihor Dow, with quantitative HCG 1374 on 07-26-14 and 1922 on 07-28-14. [place] 08-02-14 had 1 cm area in central uterus suspicious for retained products of conception, with no gestational sac and 3.3 cm hemorrhagic cyst on left ovary. She had D & C hysteroscopy by Dr Ihor Dow on 08-10-14, uterus 6 week size and very small amount of tissue. Pathology  [phone]) was consistent with placental site reaction and retained products of conception. Quantitative hCG 08-25-14 was 4856 and repeat on 09-14-14 was 6736. CT CAP 09-09-14 had no findings of concern, with a few tiny sub-cm pulmonary nodules on left thought likely post-inflammatory. She was seen by Dr Denman George on 09-14-14, with CT findings reassuring and methotrexate recommended due to further rise in hCG. She had Implanon placed RUE 06-2014 for contraception. TSH on 08-25-14 was 2.176. First methotrexate was 09-20-2014, given weekly x 7 thru 10-31-14. HCG initially dropped, then plateau by week 7. Patient insisted on removal of Implanon on 11-10-14, was started on depoProvera by Dr Ihor Dow then. Restaging scans head CAP 11-10-14 showed no progressive or metastatic disease. Treatment was changed to Actinomycin D beginning 11-14-14.  HCG results: 07-04-14 389 07-26-2014 1374 07-28-2014 1922  D& C 08-10-14 08-25-14 4856 09-14-14 6736 09-20-14 4975 First MTX 09-25-14 4136 09-27-14 3385 Second MTX 10-04-14 2394. Third MTX 10-11-14 2378 Fourth MTX 10-17-14 1928.8 Fifth MTX 10-24-2014 2287 Sixth MTX 10-31-14 2004.7 Seventh MTX 11-11-14 2693.3 11-14-14 3275 First Actinomycin D 11-21-14 1813 11-28-14 277.9Second Actinomycin D 12-01-14 141 12-05-14 77.9 5-23-162.9Third Actinomycin D 12-20-14 <2 12-26-14 <2 Fourth Actinomycin D 01-05-15 <2 01-09-15 <2 Fifth Actinomycin D 01-16-15 <2.0 02-02-15  <1 03-13-15  <2 04-17-15  <2  Review of systems as above, also: May have had some viral illness from child. No fever. No cough or SOB.  Remainder of 10 point Review of Systems negative.  Objective:  Vital signs in last 24 hours:  BP 121/70 mmHg  Pulse  86  Temp(Src) 98.2 F (36.8 C) (Oral)  Resp 18  Ht $R'4\' 10"'sT$  (1.473 m)  Wt 93 lb 6.4 oz (42.366 kg)  BMI 19.53 kg/m2  SpO2 100% Weight stable Alert, oriented and appropriate. Ambulatory without difficulty,  looks comfortable, very pleasant Normal hair pattern  HEENT:PERRL, sclerae not icteric. Oral mucosa moist without lesions, posterior pharynx clear.  Neck supple. No JVD.  Lymphatics:no cervical,supraclavicular or inguinal adenopathy Resp: clear to auscultation bilaterally and normal percussion bilaterally Cardio: regular rate and rhythm. No gallop. GI: soft, nontender, not distended, no mass or organomegaly. Normally active bowel sounds.  Musculoskeletal/ Extremities: without pitting edema, cords, tenderness Neuro: no peripheral neuropathy. Otherwise nonfocal . Psych appropriate mood and affect Skin without rash, ecchymosis, petechiae   Lab Results: 03-13-15 Results for orders placed or performed in visit on 03/13/15  CBC with Differential  Result Value Ref Range   WBC 6.4 3.9 - 10.3 10e3/uL   NEUT# 2.9 1.5 - 6.5 10e3/uL   HGB 11.9 11.6 - 15.9 g/dL   HCT 36.2 34.8 - 46.6 %   Platelets 135 (L) 145 - 400 10e3/uL   MCV 90.3 79.5 - 101.0 fL   MCH 29.7 25.1 - 34.0 pg   MCHC 32.9 31.5 - 36.0 g/dL   RBC 4.00 3.70 - 5.45 10e6/uL   RDW 13.3 11.2 - 14.5 %   lymph# 2.8 0.9 - 3.3 10e3/uL   MONO# 0.5 0.1 - 0.9 10e3/uL   Eosinophils Absolute 0.1 0.0 - 0.5 10e3/uL   Basophils Absolute 0.0 0.0 - 0.1 10e3/uL   NEUT% 45.7 38.4 - 76.8 %   LYMPH% 43.5 14.0 - 49.7 %   MONO% 8.1 0.0 - 14.0 %   EOS% 2.1 0.0 - 7.0 %   BASO% 0.6 0.0 - 2.0 %  Comprehensive metabolic panel (Cmet) - [facility]  Result Value Ref Range   Sodium 140 136 - 145 mEq/L   Potassium 3.8 3.5 - 5.1 mEq/L   Chloride 108 98 - 109 mEq/L   CO2 22 22 - 29 mEq/L   Glucose 115 70 - 140 mg/dl   BUN 10.3 7.0 - 26.0 mg/dL   Creatinine 0.7 0.6 - 1.1 mg/dL   Total Bilirubin 0.70 0.20 - 1.20 mg/dL   Alkaline Phosphatase 52 40 - 150 U/L   AST 22 5 - 34 U/L   ALT 21 0 - 55 U/L   Total Protein 7.3 6.4 - 8.3 g/dL   Albumin 4.4 3.5 - 5.0 g/dL   Calcium 9.7 8.4 - 10.4 mg/dL   Anion Gap 10 3 - 11 mEq/L   EGFR >90 >90 ml/min/1.73 m2  hCG,  quantitative, pregnancy  Result Value Ref Range   hCG, Beta Chain, Quant, S <2.0 mIU/mL    CBC diff and hCG quant S pending today  ADDENDUM  04-17-15 labs:   WBC  6.0, ANC 2.8, Hgb 12.8, plt 144k, unremarkable differential hCG  <2.0   Studies/Results:  No results found.  Medications: I have reviewed the patient's current medications, depo Provera only, which was given 04-04-15.  DISCUSSION: clinically doing well from GTD, recovered from chemo. She understands that labs will be followed for a full year from completion of treatment and she is in agreement with continuing birth control. She will see Dr Denman George as scheduled and I will see her ~ Feb coordinating with labs.  Assessment/Plan: 1.GTN following full term pregnancy/ loss of one twin at 10 weeks, in otherwise healthy 24 yo lady. Had 7 cycles weekly MTX, marker  plateau'd and restaging scans without evidence of metastatic disease. Actinomycin D 1.25 mg/m2 begun 11-14-14, with HCG normal day of cycle 3 on 12-12-14. Had total of 3 cycles beyond normal value, thru cycle 5 on 01-09-15. Surveillance with HCG monthly x 1 year. Follow up as above 2.Mildly decreased platelets 02-2015 without bleeding or bruising: repeat improved today, other counts fine, does not appear of concern, likely viral related. 3.Contraception necessary for a full year after completing GTN treatment, during ongoing monitoring of HCG marker then. Patient insisted that Nexplanon be removed on 11-10-14, now using depoProvera, given most recently 04-04-15.  4.sub-cm scattered pulmonary nodules left lung not thought likely related to GTN at time of initial imaging and stable on restaging scans 11-10-14. 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 Pyelonephritis 2014, resolved with oral antibiotics  7. 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 8.nutritional status improved, BMI 19.6 9. Constipation following pregnancy,  worse with chemo: resolved 10. Flu vaccine done 04-04-15.   All questions answered and patient knows to call if concerns prior to next scheduled visit. We will let her know results of today's labs. Time spent 20 min including >50% counseling and coordination of care. Cc Dr Heywood Footman, MD   04/17/2015, 9:10 AM

## 2015-04-18 ENCOUNTER — Telehealth: Payer: Self-pay | Admitting: *Deleted

## 2015-04-18 ENCOUNTER — Telehealth: Payer: Self-pay | Admitting: Oncology

## 2015-04-18 ENCOUNTER — Other Ambulatory Visit: Payer: Self-pay | Admitting: Oncology

## 2015-04-18 DIAGNOSIS — O019 Hydatidiform mole, unspecified: Secondary | ICD-10-CM

## 2015-04-18 LAB — HCG, QUANTITATIVE, PREGNANCY: hCG, Beta Chain, Quant, S: <2 m[IU]/mL

## 2015-04-18 NOTE — Telephone Encounter (Signed)
Appointments added per pof and patient will get a new schedule 1017

## 2015-04-18 NOTE — Telephone Encounter (Signed)
-----   Message from Gordy Levan, MD sent at 04/18/2015  9:34 AM EDT ----- Labs seen and need follow up: please let her know hCG still in good low range and blood count platelets better

## 2015-04-18 NOTE — Telephone Encounter (Signed)
Left message with note below 

## 2015-04-26 IMAGING — US US PELVIS COMPLETE
1 series · 14 of 25 positions shown · non-contrast
Comparison: 09/09/2014.

CLINICAL DATA: Abdominal pain. HCG secreting tumor. LEFT adnexal
pain. Initial encounter.

EXAM:
TRANSABDOMINAL AND TRANSVAGINAL ULTRASOUND OF PELVIS
TECHNIQUE: Both transabdominal and transvaginal ultrasound examinations of the
pelvis were performed. Transabdominal technique was performed for
global imaging of the pelvis including uterus, ovaries, adnexal
regions, and pelvic cul-de-sac. It was necessary to proceed with
endovaginal exam following the transabdominal exam to visualize the
endometrium and ovaries..

[Series 1: us pelvis complete · 0.20mm/px · 14 of 76 slices shown]
[im 1/76]
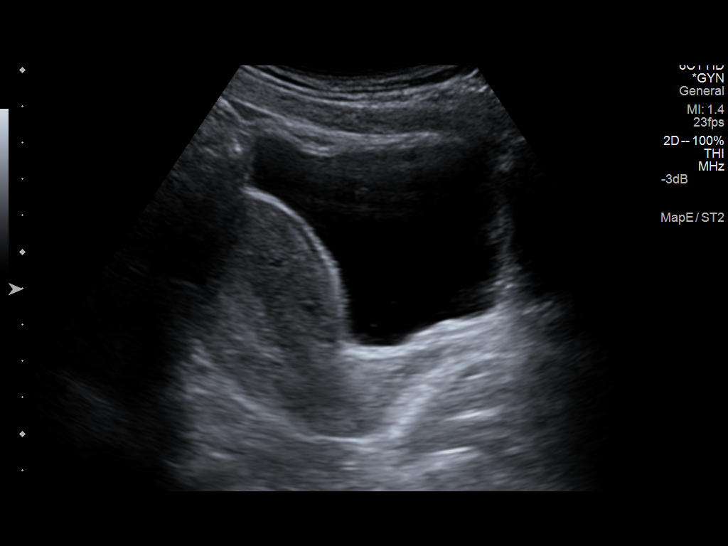
[im 7/76]
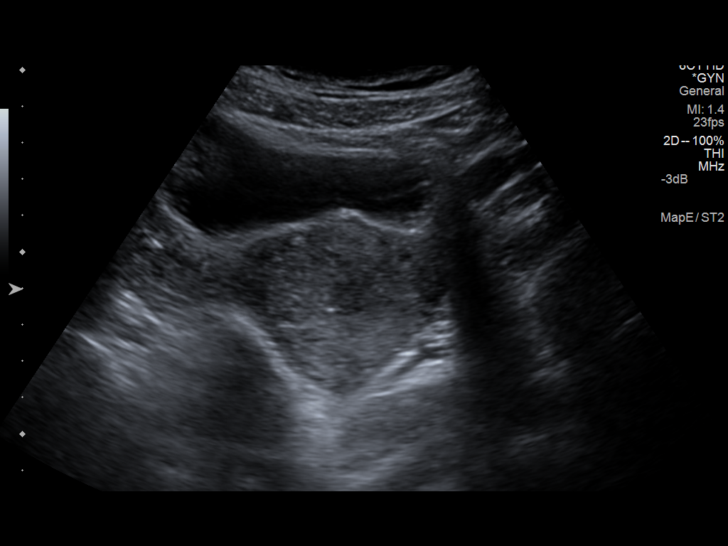
[im 13/76]
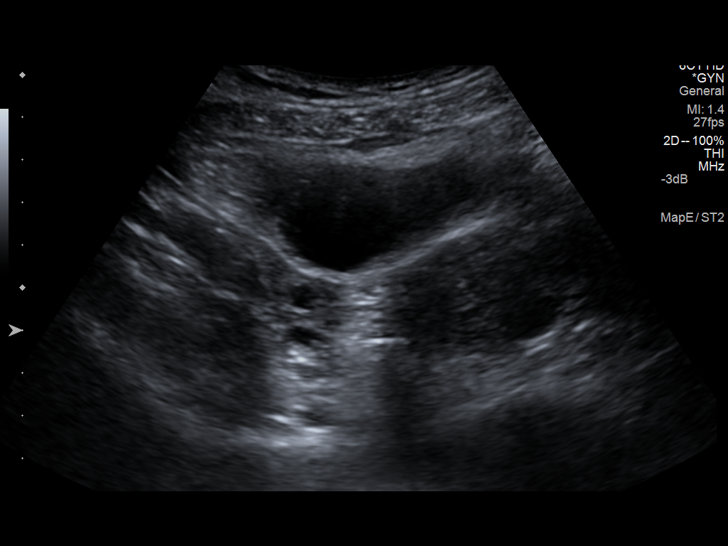
[im 19/76]
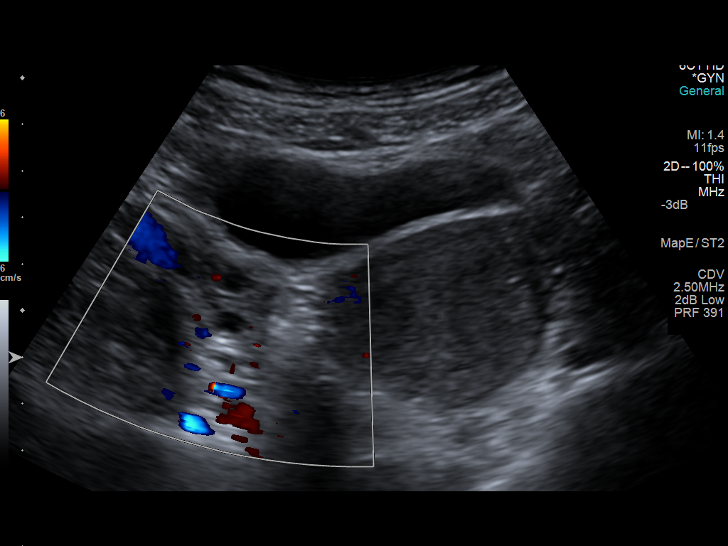
[im 26/76]
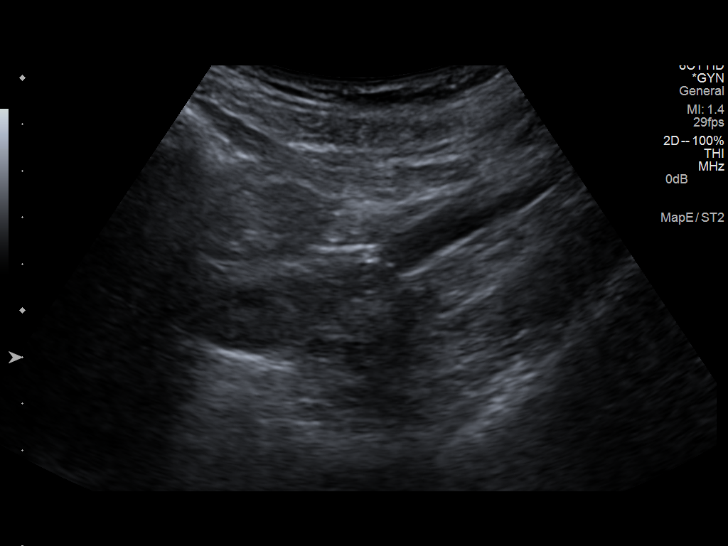
[im 29/76]
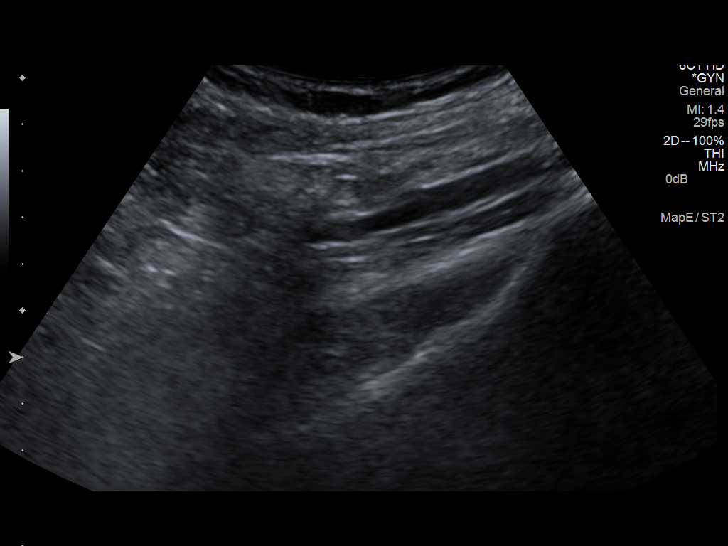
[im 35/76]
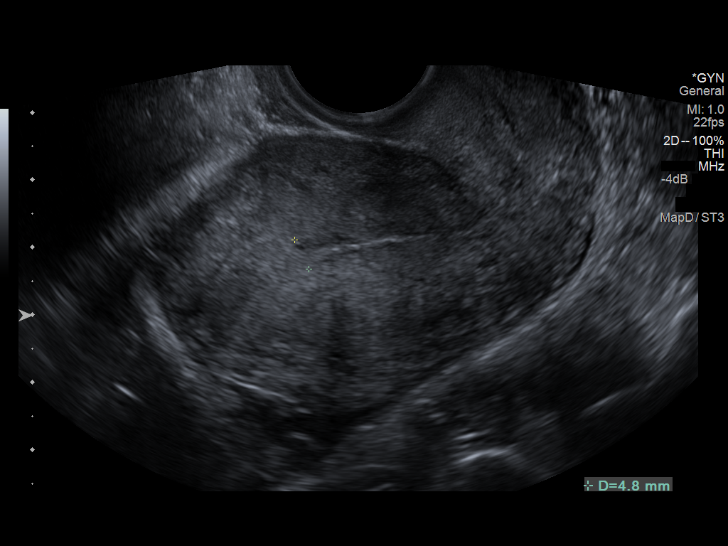
[im 41/76]
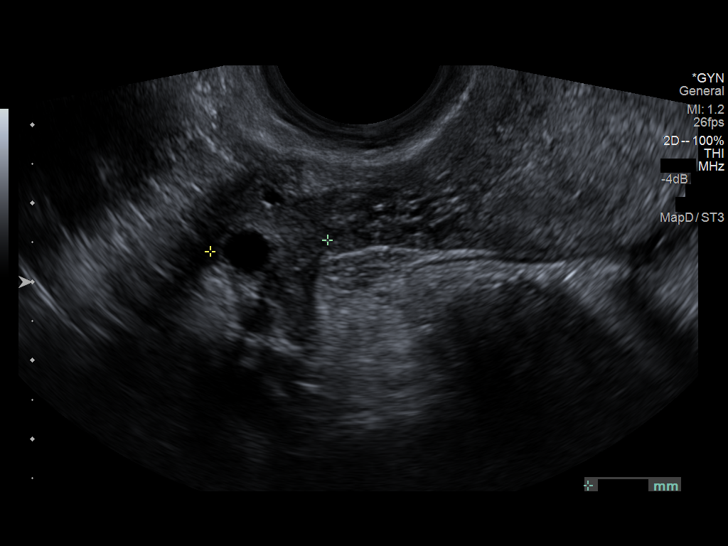
[im 47/76]
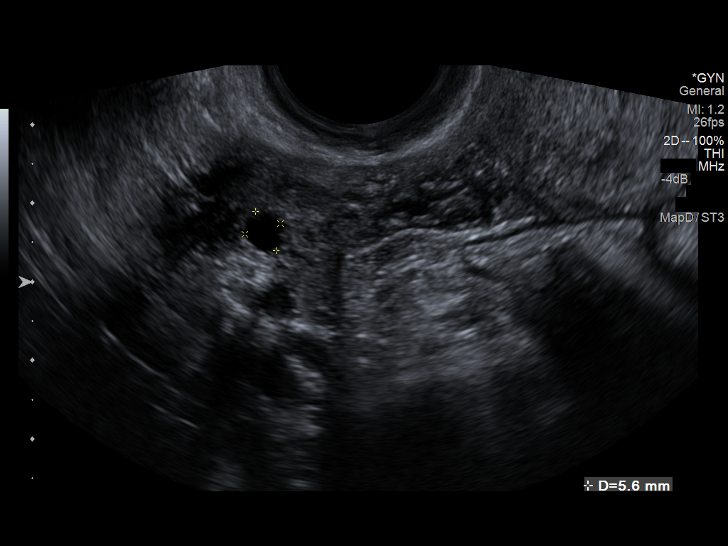
[im 51/76]
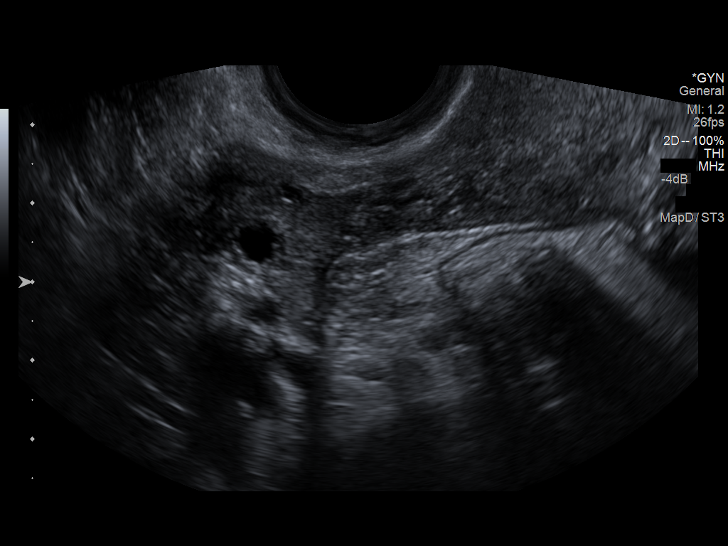
[im 57/76]
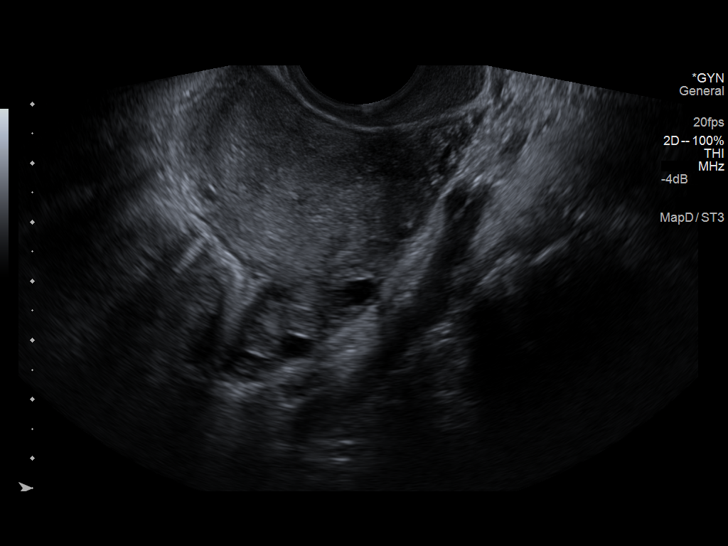
[im 63/76]
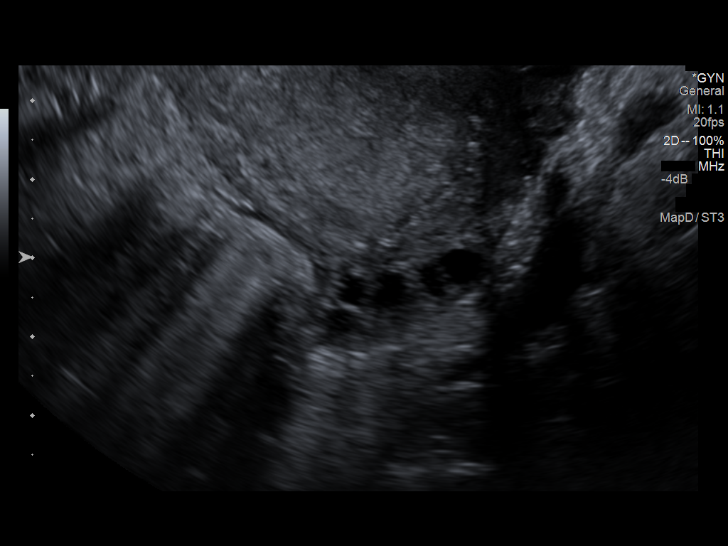
[im 69/76]
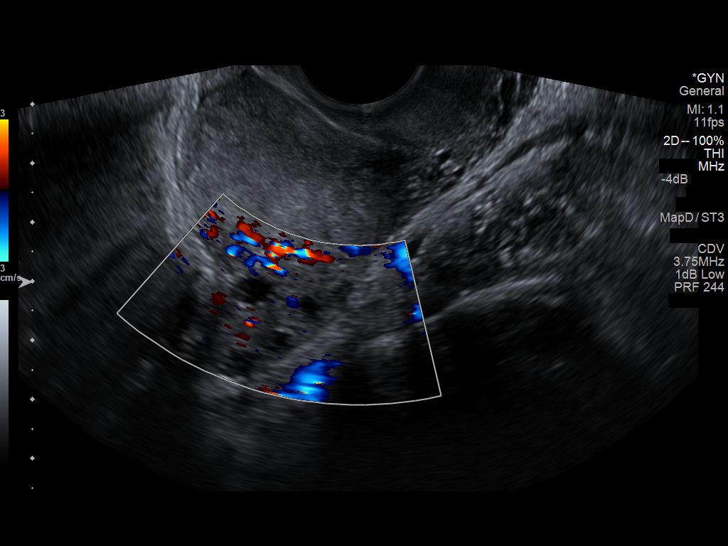
[im 76/76]
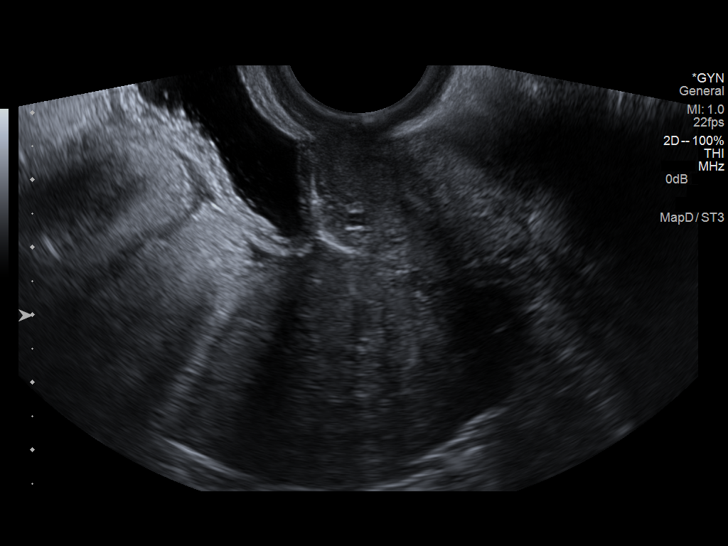

[14 of 25 positions shown; findings below may reference images not displayed]

FINDINGS: Uterus

Measurements: 6.6 cm x 4.0 cm x 4.7 cm.. No fibroids or other mass
visualized.

Endometrium

Thickness: Normal at 5 mm.  No focal abnormality visualized.

Right ovary

Measurements: 24 mm x 18 mm x 15 mm. Normal appearance/no adnexal
mass.

Left ovary

Measurements: 33 mm x 15 mm x 21 mm. Normal appearance/no adnexal
mass.

Other findings

Small amount of physiologic free fluid in the anatomic pelvis.
IMPRESSION: Normal pelvic ultrasound.

## 2015-05-08 ENCOUNTER — Ambulatory Visit: Payer: Medicaid Other | Attending: Gynecologic Oncology | Admitting: Gynecologic Oncology

## 2015-05-08 ENCOUNTER — Encounter: Payer: Self-pay | Admitting: Gynecologic Oncology

## 2015-05-08 ENCOUNTER — Other Ambulatory Visit (HOSPITAL_BASED_OUTPATIENT_CLINIC_OR_DEPARTMENT_OTHER): Payer: Medicaid Other

## 2015-05-08 ENCOUNTER — Ambulatory Visit: Payer: Medicaid Other | Admitting: Gynecologic Oncology

## 2015-05-08 ENCOUNTER — Other Ambulatory Visit: Payer: Medicaid Other

## 2015-05-08 VITALS — BP 121/79 | HR 87 | Temp 97.7°F | Resp 20 | Ht <= 58 in | Wt 93.4 lb

## 2015-05-08 DIAGNOSIS — O010 Classical hydatidiform mole: Secondary | ICD-10-CM

## 2015-05-08 DIAGNOSIS — O019 Hydatidiform mole, unspecified: Secondary | ICD-10-CM

## 2015-05-08 DIAGNOSIS — L918 Other hypertrophic disorders of the skin: Secondary | ICD-10-CM | POA: Diagnosis not present

## 2015-05-08 NOTE — Patient Instructions (Signed)
Follow up with Dr Everitt Amber in April 2017 and continue with monthly labs  , we will contact you with the biopsy results . Please call us with any changes , questions or concerns.

## 2015-05-08 NOTE — Progress Notes (Signed)
Followup Note: Gyn-Onc  Consult was initially requested by Dr. Ihor Dow for the evaluation of Shelly Koch 24 y.o. female with rising HCG levels postpartum  CC:  Chief Complaint  Patient presents with  . gestational trophoblastic neoplasm    follow-up    Assessment/Plan:  Shelly Koch  is a 24 y.o.  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 Complete response on 01/09/15 with second line therapy (Act-D) after initial failure to MTX.  Stable, normal, non-measurable HCG's x 4 months post therapy.  I reminded Shelly Koch that it is important to follow monthly hCG levels for 1 year (unitl June, 2017) to monitor for recurrence of the GTN which would necessitate restaging and reinstitution of chemotherapy.She is using Depo-Provera for birth control. Her last upper Provera shot was 04/04/2015. Her next dose is due December 5th.  She will see Dr Marko Plume in 3 months for surveillance. I will see her back in 6 months for surveillance. She will continue monthly HCG measurements.  HPI: Shelly Koch is a 24 year old gravida 1 para 1 who is 3 months status post delivery of a full-term female infant on 06/05/2014. This pregnancy was complicated by a twin pregnancy with loss of the second twin at a proximally [redacted] weeks gestational age. She also had gestational hypertension during this pregnancy. Her delivery was vaginal and per patient was uncomplicated with no retained placenta in the third stage of labor.  In December 2015 when she was one month postpartum she was experiencing abdominal pain and was seen in the emergency room for this. HCG levels were drawn at this time and was elevated.   HCG 07/04/14: 389  She was recommended to follow-up with Dr. Purvis Kilts for this elevated hCG and abdominal pain and she did so. In January she sore Dr. Purvis Kilts to repeated the hCG demonstrating that it had increased substantially. HCG 07/26/14:  3086 HCG 07/30/14: 1922   To follow-up these rises in hCG she also underwent ultrasound scan. Ultrasound 08/02/14:  No intrauterine gestational sac identified. A focal hyperechoic area is seen within the central endometrium which measures 1.1 x 0.5 x 0.8 cm. This has decreased since prior study, but is suspicious for a persistent small focus of retained products of conception. No myometrial masses are identified.  The right ovary is normal in appearance. A complex cystic lesion is again seen in the left ovary which contains a fine reticular pattern of internal echoes, without blood flow on color Doppler ultrasound. This measures 3.3 x 2.5 x 2.7 cm, and has findings consistent with a benign hemorrhagic cyst.   It was felt that the elevated hCG and ultrasound findings were representative retained products of conception. Therefore she underwent a D&C hysteroscopy 08/10/14: 6 week size uterus with small amount of tissue in cavity.  Postprocedure hCG levels were drawn and were surprisingly double what they had been 1 month prior. HCG 08/25/14: 4856  To follow-up these doubling hCG levels she underwent CT evaluation of the chest abdomen and pelvis to look for metastatic foci of trophoblastic tissue. CT chest/abdo/pelvis 09/09/14 : A few tiny sub-cm pulmonary nodules are seen scattered in the left upper and lower lobes, most likely postinflammatory in etiology in patient of this age. Normal/unremarkable uterus tubes and ovaries. No evidence for metastatic disease.  The patient was referred to Dr. Marko Plume for chemotherapy after she experiences plateauing in the fourth for hCGs. She was diagnosed as low risk metastatic (small pulmonary nodules  which were equivocal) post-molar GTN. She was treated initially with single agent methotrexate, weekly (first cycle 09/20/14). In April 2016 after receiving her seventh dose of methotrexate her hCG levels plateaued at 2693 on 11/11/14. This was an elevation from  2004.7 the week previously on 10/31/2014. She underwent restaging CT of the chest abdomen and pelvis on 11/10/2014 which showed no progressive or metastatic disease. Her chemotherapy regimen was changed to actinomycin D beginning on 11/14/2014 with hCG levels at 3275 on cycle 1 of active. She received a total of 5 cycles of active with the fifth being administered on 01/09/2015 after she had experienced 3 normal (less than 2) hCG levels prior to this. On 01/16/2015 1 week after her final cycle of active her hCG continued to be normal at less than 2 representing the fifth value in this range.  Interval History:  LMP July, 2016. HCG's have been checked monthly since completing chemotherapy in June, 2016. These have all been <2 (undetectable). The last value (prior to today) was drawn on 04/17/15  She is very bothered by a skin tag on her left buttock which she feels grew after completing chemotherapy.  Current Meds:  No outpatient encounter prescriptions on file as of 05/08/2015.   Facility-Administered Encounter Medications as of 05/08/2015  Medication  . heparin lock flush 100 unit/mL  . medroxyPROGESTERone (DEPO-PROVERA) injection 150 mg  . sodium chloride 0.9 % injection 10 mL    Allergy:  Allergies  Allergen Reactions  . Ginger Hives and Itching    Pt states "not allergic"  . [place]     Hives and itching  . Ondansetron Hives and Itching    Social Hx:   Social History   Social History  . Marital Status: Single    Spouse Name: N/A  . Number of Children: N/A  . Years of Education: N/A   Occupational History  . Not on file.   Social History Main Topics  . Smoking status: Never Smoker   . Smokeless tobacco: Never Used  . Alcohol Use: No  . Drug Use: No  . Sexual Activity:    Partners: Male    Birth Control/ Protection: None     Comment: Stopped Depo in November; trying for pregnancy   Other Topics Concern  . Not on file   Social History Narrative    Past Surgical  Hx:  Past Surgical History  Procedure Laterality Date  . No past surgeries    . Dilation and evacuation N/A 08/10/2014    Procedure: DILATATION AND EVACUATION;  Surgeon: Lavonia Drafts, MD;  Location: [facility];  Service: Gynecology;  Laterality: N/A;  . Hysteroscopy N/A 08/10/2014    Procedure: HYSTEROSCOPY;  Surgeon: Lavonia Drafts, MD;  Location: [facility];  Service: Gynecology;  Laterality: N/A;    Past Medical Hx:  Past Medical History  Diagnosis Date  . Pyelonephritis     Past Gynecological History:  SVD x 1 (06/05/14)  No LMP recorded. Patient is not currently having periods (Reason: Oral contraceptives).  Family Hx:  Family History  Problem Relation Age of Onset  . Alcohol abuse Neg Hx     Review of Systems:  Constitutional  Feels well,    ENT Normal appearing ears and nares bilaterally Skin/Breast  No rash, sores, jaundice, itching, dryness Cardiovascular  No chest pain, shortness of breath, or edema  Pulmonary  No cough or wheeze.  Gastro Intestinal  No nausea, vomitting, or diarrhoea. No bright red blood per rectum, + abdominal pain, no  change in bowel movement, +constipation.  Genito Urinary  No frequency, urgency, dysuria,  Musculo Skeletal  No myalgia, arthralgia, joint swelling or pain  Neurologic  No weakness, numbness, change in gait,  Psychology  No depression, anxiety, insomnia.   Vitals:  Blood pressure 121/79, pulse 87, temperature 97.7 F (36.5 C), temperature source Oral, resp. rate 20, height 4\' 10"  (1.473 m), weight 93 lb 6.4 oz (42.366 kg), not currently breastfeeding.  Physical Exam: WD in NAD Neck  Supple NROM, without any enlargements.  Lymph Node Survey No cervical supraclavicular or inguinal adenopathy Cardiovascular  Pulse normal rate, regularity and rhythm. S1 and S2 normal.  Lungs  Clear to auscultation bilateraly, without wheezes/crackles/rhonchi. Good air movement.  Skin  No rash/lesions/breakdown. Skin tag on  left lateral buttock - 1cm. Psychiatry  Alert and oriented to person, place, and time  Abdomen  Normoactive bowel sounds, abdomen soft, non-tender and thin without evidence of hernia.  Back No CVA tenderness Genito Urinary  Vulva/vagina: Normal external female genitalia.  No lesions. No discharge or bleeding.  Bladder/urethra:  No lesions or masses, well supported bladder  Vagina: no lesions visible  Cervix: Normal appearing, no lesions.  Uterus:  Small, mobile, no parametrial involvement or nodularity.  Adnexa: no palpable masses. Rectal  Good tone, no masses no cul de sac nodularity.  Extremities  No bilateral cyanosis, clubbing or edema.  PROCEDURE NOTE: REMOVAL OF SKIN TAG Patient provided informed consent. Skin prepped with betadine on left lateral buttock inclusive of skin tag and surrounding skin. 1cc of 2% lidocaine infiltrated under the lesion. Lesion grasped and amputated with a scalpel. Silver nitrate used to make base hemostatic.   Donaciano Eva, MD   05/08/2015, 2:32 PM  Cc: Dr Marko Plume

## 2015-05-09 ENCOUNTER — Telehealth: Payer: Self-pay

## 2015-05-09 ENCOUNTER — Other Ambulatory Visit: Payer: Self-pay | Admitting: Oncology

## 2015-05-09 LAB — HCG, QUANTITATIVE, PREGNANCY: hCG, Beta Chain, Quant, S: <2 m[IU]/mL

## 2015-05-09 NOTE — Telephone Encounter (Signed)
-----   Message from Gordy Levan, MD sent at 05/09/2015  1:17 PM EDT ----- Labs seen and need follow up: please let her know HCG staying in good low range. Keep apts as scheduled

## 2015-05-09 NOTE — Telephone Encounter (Signed)
Told her the results of the HCG level  as noted below by Dr. Marko Plume.  Ms. Bohl verbalized understanding.

## 2015-05-10 ENCOUNTER — Telehealth: Payer: Self-pay | Admitting: Oncology

## 2015-05-10 ENCOUNTER — Telehealth: Payer: Self-pay

## 2015-05-10 NOTE — Telephone Encounter (Signed)
Orders received from [facility] to contact the patient to updated with results from removal of skin tag during visit on 05/08/15 with Dr Everitt Amber . Results  "normal" . Patient contacted with results , patient states understanding , denies further questions or concerns at this time .

## 2015-05-10 NOTE — Telephone Encounter (Signed)
Appointment added to 08/2015 per pof and patient will get a new schedule at 11/14 lab

## 2015-06-05 ENCOUNTER — Other Ambulatory Visit (HOSPITAL_BASED_OUTPATIENT_CLINIC_OR_DEPARTMENT_OTHER): Payer: Medicaid Other

## 2015-06-05 DIAGNOSIS — O010 Classical hydatidiform mole: Secondary | ICD-10-CM

## 2015-06-05 DIAGNOSIS — O019 Hydatidiform mole, unspecified: Secondary | ICD-10-CM

## 2015-06-06 LAB — HCG, QUANTITATIVE, PREGNANCY

## 2015-06-09 ENCOUNTER — Telehealth: Payer: Self-pay | Admitting: *Deleted

## 2015-06-09 NOTE — Telephone Encounter (Signed)
LM with note below 

## 2015-06-09 NOTE — Telephone Encounter (Signed)
-----   Message from Baruch Merl, RN sent at 06/08/2015  8:45 PM EST -----   ----- Message -----    From: Gordy Levan, MD    Sent: 06/06/2015   1:56 PM      To: Dorothyann Gibbs, NP, Baruch Merl, RN  Labs seen and need follow up: please let her know BHCG still in good low range at <2.0

## 2015-06-26 ENCOUNTER — Other Ambulatory Visit (INDEPENDENT_AMBULATORY_CARE_PROVIDER_SITE_OTHER): Payer: Medicaid Other

## 2015-06-26 DIAGNOSIS — Z3042 Encounter for surveillance of injectable contraceptive: Secondary | ICD-10-CM

## 2015-06-26 DIAGNOSIS — Z309 Encounter for contraceptive management, unspecified: Secondary | ICD-10-CM

## 2015-06-26 MED ORDER — MEDROXYPROGESTERONE ACETATE 150 MG/ML IM SUSP
150.0000 mg | Freq: Once | INTRAMUSCULAR | Status: AC
  Administered 2015-06-26: 150 mg via INTRAMUSCULAR

## 2015-07-03 ENCOUNTER — Other Ambulatory Visit: Payer: Medicaid Other

## 2015-07-03 DIAGNOSIS — O019 Hydatidiform mole, unspecified: Secondary | ICD-10-CM

## 2015-07-03 LAB — HCG, QUANTITATIVE, PREGNANCY: hCG, Beta Chain, Quant, S: <2 m[IU]/mL

## 2015-07-05 ENCOUNTER — Telehealth: Payer: Self-pay

## 2015-07-05 NOTE — Telephone Encounter (Signed)
-----   Message from Gordy Levan, MD sent at 07/04/2015 11:33 AM EST ----- Labs seen and need follow up: please let her know HCG still in good low range at <2.0. Please be sure she has more labs set up on schedule

## 2015-07-05 NOTE — Telephone Encounter (Signed)
Told Ms. Pennypacker the result of the hcg as noted below by Dr. Marko Plume. Ms. Edgington is aware of appointments for 07-31-15 lab and lab and visit with Dr. Marko Plume on 08-28-15.

## 2015-07-28 ENCOUNTER — Encounter: Payer: Self-pay | Admitting: Medical

## 2015-07-28 ENCOUNTER — Ambulatory Visit (INDEPENDENT_AMBULATORY_CARE_PROVIDER_SITE_OTHER): Payer: Medicaid Other | Admitting: Medical

## 2015-07-28 VITALS — BP 110/70 | HR 75 | Wt 93.0 lb

## 2015-07-28 DIAGNOSIS — R51 Headache: Secondary | ICD-10-CM | POA: Diagnosis not present

## 2015-07-28 DIAGNOSIS — R519 Headache, unspecified: Secondary | ICD-10-CM

## 2015-07-28 DIAGNOSIS — K047 Periapical abscess without sinus: Secondary | ICD-10-CM | POA: Diagnosis not present

## 2015-07-28 DIAGNOSIS — K0889 Other specified disorders of teeth and supporting structures: Secondary | ICD-10-CM

## 2015-07-28 MED ORDER — AMOXICILLIN 875 MG PO TABS: 875.0000 mg | ORAL_TABLET | Freq: Two times a day (BID) | ORAL | Status: DC

## 2015-07-28 MED ORDER — HYDROCODONE-ACETAMINOPHEN 5-325 MG PO TABS: 1.0000 | ORAL_TABLET | Freq: Four times a day (QID) | ORAL | Status: DC | PRN

## 2015-07-28 NOTE — Progress Notes (Signed)
Subjective: Chief Complaint  Patient presents with  . Dental Pain    lt side towards the back. said it comes and goes. states she is taking a antibiotic for her tooth but not sure what it is called   Here for tooth ache x 3 days.   Saw dentist on [address] 38mo ago, had 7 fillings that visit.  In general brushes BID, flosses every few days.  No fever, no pus in mouth, no dental trauma.  No other aggravating or relieving factors. No other complaint.  Objective: BP 110/70 mmHg  Pulse 75  Wt 93 lb (42.185 kg)  Breastfeeding? No  Gen: wdwn, nad Skin: unremarkable, no facial erythema or warmth or swelling Oral: MMM, erythema and tender behind last molar on the left, no obvious purulent drainage or abscess   Assessment: Encounter Diagnoses  Name Primary?  . Facial pain Yes  . Tooth infection   . Tooth pain      Plan: Gracelynn was seen today for dental pain.  Diagnoses and all orders for this visit:  Facial pain  Tooth infection  Tooth pain  Other orders -     amoxicillin (AMOXIL) 875 MG tablet; Take 1 tablet (875 mg total) by mouth 2 (two) times daily. -     HYDROcodone-acetaminophen (NORCO/VICODIN) 5-325 MG tablet; Take 1 tablet by mouth every 6 (six) hours as needed for moderate pain.    Patient Instructions  Recommendations:   Use Listerine gargles twice daily  Use salt water gargles 3-4 times daily  Floss teeth daily  Brush teeth twice daily  Begin Amoxicillin twice daily for 10 days for infection  You can use OTC Ibuprofen, Tylenol for pain  If the pain is worse, you can use the pain medication I prescribed for pain.

## 2015-07-28 NOTE — Patient Instructions (Signed)
Recommendations:   Use Listerine gargles twice daily  Use salt water gargles 3-4 times daily  Floss teeth daily  Brush teeth twice daily  Begin Amoxicillin twice daily for 10 days for infection  You can use OTC Ibuprofen, Tylenol for pain  If the pain is worse, you can use the pain medication I prescribed for pain.

## 2015-07-31 ENCOUNTER — Other Ambulatory Visit: Payer: Medicaid Other

## 2015-07-31 DIAGNOSIS — O019 Hydatidiform mole, unspecified: Secondary | ICD-10-CM

## 2015-08-01 LAB — HCG, QUANTITATIVE, PREGNANCY: hCG, Beta Chain, Quant, S: <2 m[IU]/mL

## 2015-08-01 LAB — BETA HCG QUANT (REF LAB)

## 2015-08-04 ENCOUNTER — Telehealth: Payer: Self-pay

## 2015-08-04 NOTE — Telephone Encounter (Signed)
Told Ms. Tmsina the result of the BHCG as noted below by Dr. Marko Plume. Reviewed appointment date and time for 08-28-15 with Ms. Charles.

## 2015-08-04 NOTE — Telephone Encounter (Signed)
-----   Message from Gordy Levan, MD sent at 08/04/2015  2:26 PM EST ----- Labs seen and need follow up: please let her know BHCG still in good low range. Keep apt for lab and MD Feb

## 2015-08-27 ENCOUNTER — Other Ambulatory Visit: Payer: Self-pay | Admitting: Oncology

## 2015-08-28 ENCOUNTER — Telehealth: Payer: Self-pay | Admitting: Oncology

## 2015-08-28 ENCOUNTER — Other Ambulatory Visit: Payer: Self-pay | Admitting: *Deleted

## 2015-08-28 ENCOUNTER — Encounter: Payer: Self-pay | Admitting: Oncology

## 2015-08-28 ENCOUNTER — Ambulatory Visit (HOSPITAL_BASED_OUTPATIENT_CLINIC_OR_DEPARTMENT_OTHER): Payer: Medicaid Other | Admitting: Oncology

## 2015-08-28 ENCOUNTER — Ambulatory Visit: Payer: Medicaid Other

## 2015-08-28 ENCOUNTER — Other Ambulatory Visit (HOSPITAL_BASED_OUTPATIENT_CLINIC_OR_DEPARTMENT_OTHER): Payer: Medicaid Other

## 2015-08-28 VITALS — BP 120/67 | HR 80 | Temp 97.8°F | Resp 18 | Ht <= 58 in | Wt 89.2 lb

## 2015-08-28 DIAGNOSIS — O010 Classical hydatidiform mole: Secondary | ICD-10-CM

## 2015-08-28 DIAGNOSIS — O019 Hydatidiform mole, unspecified: Secondary | ICD-10-CM

## 2015-08-28 LAB — CBC WITH DIFFERENTIAL/PLATELET
BASO%: 0.2 % (ref 0.0–2.0)
Basophils Absolute: 0 10*3/uL (ref 0.0–0.1)
EOS%: 5.4 % (ref 0.0–7.0)
Eosinophils Absolute: 0.3 10*3/uL (ref 0.0–0.5)
HCT: 36.8 % (ref 34.8–46.6)
HGB: 12 g/dL (ref 11.6–15.9)
LYMPH%: 54.4 % — AB (ref 14.0–49.7)
MCH: 30.9 pg (ref 25.1–34.0)
MCHC: 32.6 g/dL (ref 31.5–36.0)
MCV: 94.8 fL (ref 79.5–101.0)
MONO#: 0.3 10*3/uL (ref 0.1–0.9)
MONO%: 5.6 % (ref 0.0–14.0)
NEUT%: 34.4 % — ABNORMAL LOW (ref 38.4–76.8)
NEUTROS ABS: 1.9 10*3/uL (ref 1.5–6.5)
PLATELETS: 175 10*3/uL (ref 145–400)
RBC: 3.88 10*6/uL (ref 3.70–5.45)
RDW: 12.6 % (ref 11.2–14.5)
WBC: 5.5 10*3/uL (ref 3.9–10.3)
lymph#: 3 10*3/uL (ref 0.9–3.3)

## 2015-08-28 NOTE — Telephone Encounter (Signed)
Per 2/5 pof cxd 7/24 lab - patient to get new schedule at march lab visit.

## 2015-08-28 NOTE — Telephone Encounter (Signed)
Patient sent back to lab and given avs report and appointments for March thru July. No other order per 2/6 pof other than send patient back to lab.

## 2015-08-28 NOTE — Telephone Encounter (Signed)
per pof to sch pt appt-cld & spoke to pt & gave pt time & date of appt 4/3

## 2015-08-28 NOTE — Progress Notes (Signed)
OFFICE PROGRESS NOTE   August 30, 2015   Physicians: Juel Burrow, MD, Lavonia Drafts  INTERVAL HISTORY:  Patient is seen, alone for visit, in continuing follow up of previously treated post pregnancy GTD, complete response to second line Actinomycin D after initial failure of MTX. She had last Actinomycin D on 01-09-15. She is being followed with monthly beta HCG levels, planned thru 12-2015. All levels have been Patient saw Dr Denman George on 05-08-15 and will see her again 10-23-15 coordinating with lab draw that day.Contraception is depoProvera, given by PCP Dr Redmond School at [facility]   Patient has felt well since she was here last, with no complaints that seem referable to the GTD or that treatment. She has some chronic constipation, which is fairly mild now. Appetite is good, no nausea or vomiting, no abdominal or pelvic pain, no SOB or other respiratory symptoms, no other pain, no fever or symptoms of infection, no HA or other neurologic symptoms. No LE swelling. Remainder of 10 point Review of Systems negative   PICC used for chemo was DCd Flu vaccine done 04-04-15.   ONCOLOGIC HISTORY Patient had twin pregnancy in 2015, with loss of one of the twins at [redacted] weeks gestation and delivery of full term female on 06-05-14, pathology of placenta with no hydatidiform change. 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 She was seen for lower abdominal pain and vaginal bleeding in ED 07-04-14, with quantitative hCG 389 and pelvic US showing thickened, heterogeneous endometrium with internal blood flow, suspicious for retained products of conception. She was seen by Dr Ihor Dow, with quantitative HCG 1374 on 07-26-14 and 1922 on 07-28-14. [place] 08-02-14 had 1 cm area in central uterus suspicious for retained products of conception, with no gestational sac and 3.3 cm hemorrhagic cyst on left ovary. She had D & C hysteroscopy by Dr Ihor Dow on 08-10-14, uterus 6  week size and very small amount of tissue. Pathology [id number]) was consistent with placental site reaction and retained products of conception. Quantitative hCG 08-25-14 was 4856 and repeat on 09-14-14 was 6736. CT CAP 09-09-14 had no findings of concern, with a few tiny sub-cm pulmonary nodules on left thought likely post-inflammatory. She was seen by Dr Denman George on 09-14-14, with CT findings reassuring and methotrexate recommended due to further rise in hCG. She had Implanon placed RUE 06-2014 for contraception. TSH on 08-25-14 was 2.176. First methotrexate was 09-20-2014, given weekly x 7 thru 10-31-14. HCG initially dropped, then plateau by week 7. Patient insisted on removal of Implanon on 11-10-14, was started on depoProvera by Dr Ihor Dow then. Restaging scans head CAP 11-10-14 showed no progressive or metastatic disease. Treatment was changed to Actinomycin D beginning 11-14-14.  HCG results: 07-04-14 389 07-26-2014 1374 07-28-2014 1922  D& C 08-10-14 08-25-14 4856 09-14-14 6736 09-20-14 4975 First MTX 09-25-14 4136 09-27-14 3385 Second MTX 10-04-14 2394. Third MTX 10-11-14 2378 Fourth MTX 10-17-14 1928.8 Fifth MTX 10-24-2014 2287 Sixth MTX 10-31-14 2004.7 Seventh MTX 11-11-14 2693.3 11-14-14 3275 First Actinomycin D 11-21-14 1813 11-28-14 277.9Second Actinomycin D 12-01-14 141 12-05-14 77.9 5-23-162.9Third Actinomycin D 12-20-14 <2 12-26-14 <2 Fourth Actinomycin D 01-05-15 <2 01-09-15 <2 Fifth Actinomycin D 01-16-15 <2.0 02-02-15 <1 03-13-15 <2 04-17-15 <2 05-08-15  <2 06-05-15  <2 07-03-15  <2 07-31-15  <2  Review of systems as above, also:  Remainder of 10 point Review of Systems negative.  Objective:  Vital signs in last 24 hours:  BP 120/67 mmHg  Pulse 80  Temp(Src) 97.8 F (36.6 C) (Oral)  Resp 18  Ht 4\' 10"  (1.473 m)  Wt 89 lb 3.2 oz (40.461 kg)  BMI 18.65 kg/m2  SpO2 100% Weight down 4 lbs. BMI as above just in normal  range (underweight <18.5) Alert, oriented and appropriate. Ambulatory easily. Looks comfortable, respirations not labored.   HEENT:PERRL, sclerae not icteric. Oral mucosa moist without lesions, posterior pharynx clear.  Neck supple. No JVD.  Lymphatics:no cervical,supraclavicular, axillary or inguinal adenopathy Resp: clear to auscultation bilaterally and normal percussion bilaterally Cardio: regular rate and rhythm. No gallop. GI: soft, nontender, not distended, no mass or organomegaly. Normally active bowel sounds.  Musculoskeletal/ Extremities: without pitting edema, cords, tenderness Neuro: no peripheral neuropathy. Otherwise nonfocal Skin without rash, ecchymosis, petechiae   Lab Results:  Results for orders placed or performed in visit on 08/28/15  CBC with Differential  Result Value Ref Range   WBC 5.5 3.9 - 10.3 10e3/uL   NEUT# 1.9 1.5 - 6.5 10e3/uL   HGB 12.0 11.6 - 15.9 g/dL   HCT 36.8 34.8 - 46.6 %   Platelets 175 145 - 400 10e3/uL   MCV 94.8 79.5 - 101.0 fL   MCH 30.9 25.1 - 34.0 pg   MCHC 32.6 31.5 - 36.0 g/dL   RBC 3.88 3.70 - 5.45 10e6/uL   RDW 12.6 11.2 - 14.5 %   lymph# 3.0 0.9 - 3.3 10e3/uL   MONO# 0.3 0.1 - 0.9 10e3/uL   Eosinophils Absolute 0.3 0.0 - 0.5 10e3/uL   Basophils Absolute 0.0 0.0 - 0.1 10e3/uL   NEUT% 34.4 (L) 38.4 - 76.8 %   LYMPH% 54.4 (H) 14.0 - 49.7 %   MONO% 5.6 0.0 - 14.0 %   EOS% 5.4 0.0 - 7.0 %   BASO% 0.2 0.0 - 2.0 %   beta HCG reported after visit <1 by new lab method (previous lab method result still pending)  Studies/Results:  No results found.  Medications: I have reviewed the patient's current medications. Last depoProvera by Dr Lanice Shirts office 06-26-15; her next appointment is already scheduled there  DISCUSSION Clinically doing well. Continue monthly labs until out a year from completion of therapy. Continue contraception at least thru 12-2015. She is aware of Dr Serita Grit next appointment  CBC all within normal limits  today (transient low platelets late 2016 likely URI related)  Several conversations and messages with [facility] lab today re ordering for the beta HCG today and thru June.  Assessment/Plan:  1.GTN following full term pregnancy/ loss of one twin at 10 weeks, in otherwise healthy 25 yo lady. Had 7 cycles weekly MTX, marker plateau'd and restaging scans without evidence of metastatic disease. Actinomycin D 1.25 mg/m2 begun 11-14-14, with HCG normal day of cycle 3 on 12-12-14. Had total of 3 cycles beyond normal value, thru cycle 5 on 01-09-15. Surveillance with HCG monthly x 1 year. Follow up with Dr Denman George in April. I am glad to see her back if needed, but have not made another appointment at this office. 2.transient thrombocytopenia resolved, likely viral related 3.Contraception necessary for a full year after completing GTN treatment, during ongoing monitoring of HCG marker then. Patient insisted that Nexplanon be removed on 11-10-14, now using depoProvera, given most recently 06-26-15.  4.sub-cm scattered pulmonary nodules left lung not thought likely related to GTN at time of initial imaging and stable on restaging scans 11-10-14. 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 Pyelonephritis 2014, resolved with oral  antibiotics  7. 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 8.nutritional status/ weight of concern during pregnancy. She is mostly vegetarian. 9. Constipation following pregnancy, worse with chemo: resolved 10. Flu vaccine done 04-04-15.   All questions answered. Time spent 20 min including >50% counseling and coordination of care. Cc Dr Heywood Footman, MD   08/30/2015, 4:53 PM

## 2015-08-29 ENCOUNTER — Telehealth: Payer: Self-pay

## 2015-08-29 LAB — BETA HCG QUANT (REF LAB): hCG Quant: <1 m[IU]/mL

## 2015-08-29 NOTE — Telephone Encounter (Signed)
LM stating the results of the HCG level as noted below by Dr. Marko Plume. Shelly Koch can call [facility] if she has any questions or concerns.

## 2015-08-29 NOTE — Telephone Encounter (Signed)
-----   Message from Gordy Levan, MD sent at 08/29/2015  3:10 PM EST ----- Labs seen and need follow up: please let her know HCG fine at <1

## 2015-08-31 LAB — BETA HCG, QN (PARALLEL TESTING): Beta hCG, Tumor Marker: <2 m[IU]/mL (ref <5.0)

## 2015-09-18 ENCOUNTER — Other Ambulatory Visit (INDEPENDENT_AMBULATORY_CARE_PROVIDER_SITE_OTHER): Payer: Medicaid Other

## 2015-09-18 DIAGNOSIS — Z3042 Encounter for surveillance of injectable contraceptive: Secondary | ICD-10-CM

## 2015-09-18 MED ORDER — MEDROXYPROGESTERONE ACETATE 150 MG/ML IM SUSP
150.0000 mg | Freq: Once | INTRAMUSCULAR | Status: AC
  Administered 2015-09-18: 150 mg via INTRAMUSCULAR

## 2015-09-25 ENCOUNTER — Other Ambulatory Visit: Payer: Self-pay | Admitting: Oncology

## 2015-09-25 ENCOUNTER — Other Ambulatory Visit: Payer: Medicaid Other

## 2015-09-25 DIAGNOSIS — O019 Hydatidiform mole, unspecified: Secondary | ICD-10-CM

## 2015-09-26 LAB — BETA HCG QUANT (REF LAB): hCG Quant: <1 m[IU]/mL

## 2015-09-29 ENCOUNTER — Telehealth: Payer: Self-pay

## 2015-09-29 NOTE — Telephone Encounter (Signed)
-----   Message from Gordy Levan, MD sent at 09/29/2015  7:52 AM EST ----- Labs seen and need follow up: please let her know HCG is in good, very low range

## 2015-09-29 NOTE — Telephone Encounter (Signed)
Told Ms. Teng the results of the HCG Level as noted below by Dr. Marko Plume.

## 2015-10-23 ENCOUNTER — Other Ambulatory Visit: Payer: Medicaid Other

## 2015-10-23 ENCOUNTER — Ambulatory Visit: Payer: Medicaid Other | Attending: Gynecologic Oncology | Admitting: Gynecologic Oncology

## 2015-10-23 ENCOUNTER — Encounter: Payer: Self-pay | Admitting: Gynecologic Oncology

## 2015-10-23 ENCOUNTER — Other Ambulatory Visit (HOSPITAL_BASED_OUTPATIENT_CLINIC_OR_DEPARTMENT_OTHER): Payer: Medicaid Other

## 2015-10-23 VITALS — BP 118/78 | HR 99 | Temp 98.4°F | Resp 18 | Ht <= 58 in | Wt 92.1 lb

## 2015-10-23 DIAGNOSIS — Z8759 Personal history of other complications of pregnancy, childbirth and the puerperium: Secondary | ICD-10-CM | POA: Insufficient documentation

## 2015-10-23 DIAGNOSIS — Z793 Long term (current) use of hormonal contraceptives: Secondary | ICD-10-CM | POA: Diagnosis not present

## 2015-10-23 DIAGNOSIS — O010 Classical hydatidiform mole: Secondary | ICD-10-CM

## 2015-10-23 DIAGNOSIS — M25552 Pain in left hip: Secondary | ICD-10-CM

## 2015-10-23 DIAGNOSIS — O019 Hydatidiform mole, unspecified: Secondary | ICD-10-CM

## 2015-10-23 DIAGNOSIS — N12 Tubulo-interstitial nephritis, not specified as acute or chronic: Secondary | ICD-10-CM | POA: Diagnosis not present

## 2015-10-23 DIAGNOSIS — R918 Other nonspecific abnormal finding of lung field: Secondary | ICD-10-CM | POA: Insufficient documentation

## 2015-10-23 NOTE — Progress Notes (Signed)
Followup Note: Gyn-Onc  Consult was initially requested by Dr. Ihor Dow for the evaluation of Shelly Koch 25 y.o. female with post (term) pregnancy GTN  CC:  Chief Complaint  Patient presents with  . gestational trophobastic neoplasm    MD follow up visit    Assessment/Plan:  Shelly Koch  is a 24 y.o.  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 Complete response on 01/09/15 with second line therapy (Act-D) after initial failure to MTX.  Stable, normal, non-measurable HCG's x 4 months post therapy.  I reminded Shelly Koch that it is important to follow monthly hCG levels for 1 year (unitl June, 2017) to monitor for recurrence of the GTN which would necessitate restaging and reinstitution of chemotherapy.She is using Depo-Provera for birth control. Her last upper Provera shot was February, 2017. Her next dose is due May, 2017  She will see me back in June 2017 for follow-up (12 months post completion of therapy) at which time we will discontinue follow-up if she remains NED with normal HCG levels. US pelvis to work up pelvic pain (possible ovarian cyst).  HPI: Shelly Koch is a 25 year old gravida 1 para 1 who is 3 months status post delivery of a full-term female infant on 06/05/2014. This pregnancy was complicated by a twin pregnancy with loss of the second twin at a proximally [redacted] weeks gestational age. She also had gestational hypertension during this pregnancy. Her delivery was vaginal and per patient was uncomplicated with no retained placenta in the third stage of labor.  In December 2015 when she was one month postpartum she was experiencing abdominal pain and was seen in the emergency room for this. HCG levels were drawn at this time and was elevated.   HCG 07/04/14: 389  She was recommended to follow-up with Dr. Purvis Kilts for this elevated hCG and abdominal pain and she did so. In January she sore Dr. Purvis Kilts  to repeated the hCG demonstrating that it had increased substantially. HCG 07/26/14: [id number] HCG 07/30/14: 1922   To follow-up these rises in hCG she also underwent ultrasound scan. Ultrasound 08/02/14:  No intrauterine gestational sac identified. A focal hyperechoic area is seen within the central endometrium which measures 1.1 x 0.5 x 0.8 cm. This has decreased since prior study, but is suspicious for a persistent small focus of retained products of conception. No myometrial masses are identified.  The right ovary is normal in appearance. A complex cystic lesion is again seen in the left ovary which contains a fine reticular pattern of internal echoes, without blood flow on color Doppler ultrasound. This measures 3.3 x 2.5 x 2.7 cm, and has findings consistent with a benign hemorrhagic cyst.   It was felt that the elevated hCG and ultrasound findings were representative retained products of conception. Therefore she underwent a D&C hysteroscopy 08/10/14: 6 week size uterus with small amount of tissue in cavity.  Postprocedure hCG levels were drawn and were surprisingly double what they had been 1 month prior. HCG 08/25/14: 4856  To follow-up these doubling hCG levels she underwent CT evaluation of the chest abdomen and pelvis to look for metastatic foci of trophoblastic tissue. CT chest/abdo/pelvis 09/09/14 : A few tiny sub-cm pulmonary nodules are seen scattered in the left upper and lower lobes, most likely postinflammatory in etiology in patient of this age. Normal/unremarkable uterus tubes and ovaries. No evidence for metastatic disease.  The patient was referred to Dr. Marko Plume for chemotherapy  after she experiences plateauing in the fourth for hCGs. 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 She was treated initially with single agent methotrexate, weekly (first cycle 09/20/14). In April 2016 after receiving her seventh dose of  methotrexate her hCG levels plateaued at 2693 on 11/11/14. This was an elevation from 2004.7 the week previously on 10/31/2014. She underwent restaging CT of the chest abdomen and pelvis on 11/10/2014 which showed no progressive or metastatic disease. Her chemotherapy regimen was changed to actinomycin D beginning on 11/14/2014 with hCG levels at 3275 on cycle 1 of active. She received a total of 5 cycles of active with the fifth being administered on 01/09/2015 after she had experienced 3 normal (less than 2) hCG levels prior to this. On 01/16/2015 1 week after her final cycle of active her hCG continued to be normal at less than 2 representing the fifth value in this range.  HCG's have been checked monthly since completing chemotherapy in June, 2016. These have all been <2 (undetectable). The last value (prior to today) was drawn on 09/20/15 (and today - pending)  Interval History: She reports irregular bleeding (every 2 weeks) on depo. She has had mild to moderate LLQ abdo/pelvis pain x 2 months.  Current Meds:  No outpatient encounter prescriptions on file as of 10/23/2015.   Facility-Administered Encounter Medications as of 10/23/2015  Medication  . heparin lock flush 100 unit/mL  . medroxyPROGESTERone (DEPO-PROVERA) injection 150 mg  . sodium chloride 0.9 % injection 10 mL    Allergy:  Allergies  Allergen Reactions  . Ginger Hives and Itching    Pt states "not allergic"  . [place]     Hives and itching  . Ondansetron Hives and Itching  . Phenergan [Promethazine] Hives and Itching    Note: pt tolerates PO COMPAZINE  . Zofran [Ondansetron Hcl] Rash    Social Hx:   Social History   Social History  . Marital Status: Single    Spouse Name: N/A  . Number of Children: N/A  . Years of Education: N/A   Occupational History  . Not on file.   Social History Main Topics  . Smoking status: Never Smoker   . Smokeless tobacco: Never Used  . Alcohol Use: No  . Drug Use: No  . Sexual  Activity:    Partners: Male    Birth Control/ Protection: None     Comment: Stopped Depo in November; trying for pregnancy   Other Topics Concern  . Not on file   Social History Narrative    Past Surgical Hx:  Past Surgical History  Procedure Laterality Date  . No past surgeries    . Dilation and evacuation N/A 08/10/2014    Procedure: DILATATION AND EVACUATION;  Surgeon: Lavonia Drafts, MD;  Location: [facility];  Service: Gynecology;  Laterality: N/A;  . Hysteroscopy N/A 08/10/2014    Procedure: HYSTEROSCOPY;  Surgeon: Lavonia Drafts, MD;  Location: [facility];  Service: Gynecology;  Laterality: N/A;    Past Medical Hx:  Past Medical History  Diagnosis Date  . Pyelonephritis     Past Gynecological History:  SVD x 1 (06/05/14)  No LMP recorded. Patient is not currently having periods (Reason: Oral contraceptives).  Family Hx:  Family History  Problem Relation Age of Onset  . Alcohol abuse Neg Hx     Review of Systems:  Constitutional  Feels well,    ENT Normal appearing ears and nares bilaterally Skin/Breast  No rash, sores, jaundice, itching, dryness Cardiovascular  No chest pain, shortness of breath, or edema  Pulmonary  No cough or wheeze.  Gastro Intestinal  No nausea, vomitting, or diarrhoea. No bright red blood per rectum, + abdominal pain, no change in bowel movement.  Genito Urinary  No frequency, urgency, dysuria,  Musculo Skeletal  No myalgia, arthralgia, joint swelling or pain  Neurologic  No weakness, numbness, change in gait,  Psychology  No depression, anxiety, insomnia.   Vitals:  Blood pressure 118/78, pulse 99, temperature 98.4 F (36.9 C), temperature source Oral, resp. rate 18, height 4\' 10"  (1.473 m), weight 92 lb 1.6 oz (41.776 kg), SpO2 100 %, not currently breastfeeding.  Physical Exam: WD in NAD Neck  Supple NROM, without any enlargements.  Lymph Node Survey No cervical supraclavicular or inguinal  adenopathy Cardiovascular  Pulse normal rate, regularity and rhythm. S1 and S2 normal.  Lungs  Clear to auscultation bilateraly, without wheezes/crackles/rhonchi. Good air movement.  Skin  No rash/lesions/breakdown. Skin tag on left lateral buttock - 1cm. Psychiatry  Alert and oriented to person, place, and time  Abdomen  Normoactive bowel sounds, abdomen soft, non-tender and thin without evidence of hernia.  Back No CVA tenderness Genito Urinary  Vulva/vagina: Normal external female genitalia.  No lesions. No discharge or bleeding.  Bladder/urethra:  No lesions or masses, well supported bladder  Vagina: no lesions visible  Cervix: Normal appearing, no lesions.  Uterus:  Small, mobile, no parametrial involvement or nodularity.  Adnexa: no palpable masses. Rectal  Good tone, no masses no cul de sac nodularity.  Extremities  No bilateral cyanosis, clubbing or edema.   Donaciano Eva, MD   10/23/2015, 2:24 PM  Cc: Dr Marko Plume

## 2015-10-23 NOTE — Patient Instructions (Signed)
Your U/S is scheduled for Monday April 10 , 2017 at 9 AM , please arrive at 8:45 AM ,we will contact you with the results . Follow up with Dr Everitt Amber June 12 , 2017 at 3 PM , lease call with changes , questions or concerns.  Thank you !

## 2015-10-24 ENCOUNTER — Telehealth: Payer: Self-pay

## 2015-10-24 LAB — BETA HCG QUANT (REF LAB)

## 2015-10-24 NOTE — Telephone Encounter (Signed)
-----   Message from Gordy Levan, MD sent at 10/24/2015  8:17 AM EDT ----- Labs seen and need follow up: please let her know the beta HCG is still in good low range   thanks

## 2015-10-24 NOTE — Telephone Encounter (Signed)
S/w pt per Dr Edwyna Shell attached message

## 2015-10-30 ENCOUNTER — Ambulatory Visit (HOSPITAL_COMMUNITY)
Admission: RE | Admit: 2015-10-30 | Discharge: 2015-10-30 | Disposition: A | Discharge status: Home / Self Care | Payer: Medicaid Other | Source: Ambulatory Visit | Attending: Gynecologic Oncology | Admitting: Gynecologic Oncology

## 2015-10-30 DIAGNOSIS — M25552 Pain in left hip: Secondary | ICD-10-CM | POA: Diagnosis present

## 2015-10-30 DIAGNOSIS — O019 Hydatidiform mole, unspecified: Secondary | ICD-10-CM

## 2015-10-30 DIAGNOSIS — Z3A00 Weeks of gestation of pregnancy not specified: Secondary | ICD-10-CM | POA: Diagnosis not present

## 2015-10-30 DIAGNOSIS — O010 Classical hydatidiform mole: Secondary | ICD-10-CM | POA: Insufficient documentation

## 2015-10-31 ENCOUNTER — Telehealth: Payer: Self-pay

## 2015-10-31 NOTE — Telephone Encounter (Signed)
Orders received from [facility] to contact the patient to update with US Transvaginal Non-OB : "normal" , "nothing to explain the cause of her pain" . Patient contacted and updated with results being "normal . Patient states understanding and states she continues to have "apin> Patient instructed to see her PCP for further evaluation . Patient states understanding and will contact her PCP , patient denies further questions or concerns at this time.

## 2015-11-06 ENCOUNTER — Ambulatory Visit: Payer: Medicaid Other | Admitting: Gynecologic Oncology

## 2015-11-17 ENCOUNTER — Other Ambulatory Visit (HOSPITAL_BASED_OUTPATIENT_CLINIC_OR_DEPARTMENT_OTHER): Payer: Medicaid Other

## 2015-11-17 DIAGNOSIS — O010 Classical hydatidiform mole: Secondary | ICD-10-CM | POA: Diagnosis not present

## 2015-11-17 DIAGNOSIS — O019 Hydatidiform mole, unspecified: Secondary | ICD-10-CM

## 2015-11-18 LAB — BETA HCG QUANT (REF LAB): hCG Quant: <1 m[IU]/mL

## 2015-11-20 ENCOUNTER — Other Ambulatory Visit: Payer: Medicaid Other

## 2015-11-21 ENCOUNTER — Telehealth: Payer: Self-pay

## 2015-11-21 ENCOUNTER — Ambulatory Visit: Payer: Medicaid Other | Admitting: Medical

## 2015-11-21 NOTE — Telephone Encounter (Signed)
Told Ms. Werden the results of the HCG tumor marker level  From 11-17-15 as note below by Dr. Marko Plume. Ms. Cianciulli verbalized understanding.

## 2015-11-21 NOTE — Telephone Encounter (Signed)
-----   Message from Gordy Levan, MD sent at 11/21/2015  9:17 AM EDT ----- Labs seen and need follow up: please let her know hCG in good low range still

## 2015-12-06 ENCOUNTER — Telehealth: Payer: Self-pay | Admitting: Oncology

## 2015-12-06 NOTE — Telephone Encounter (Signed)
pt called to r/s lab...done...pt ok and aware of new d.t °

## 2015-12-11 ENCOUNTER — Other Ambulatory Visit: Payer: Medicaid Other

## 2015-12-19 ENCOUNTER — Other Ambulatory Visit: Payer: Medicaid Other

## 2015-12-21 ENCOUNTER — Other Ambulatory Visit: Payer: Self-pay

## 2015-12-21 DIAGNOSIS — O019 Hydatidiform mole, unspecified: Secondary | ICD-10-CM

## 2015-12-22 ENCOUNTER — Other Ambulatory Visit (HOSPITAL_BASED_OUTPATIENT_CLINIC_OR_DEPARTMENT_OTHER): Payer: Medicaid Other

## 2015-12-22 DIAGNOSIS — E349 Endocrine disorder, unspecified: Secondary | ICD-10-CM | POA: Diagnosis not present

## 2015-12-22 DIAGNOSIS — O019 Hydatidiform mole, unspecified: Secondary | ICD-10-CM

## 2015-12-23 LAB — BETA HCG QUANT (REF LAB): hCG Quant: <1 m[IU]/mL

## 2016-01-01 ENCOUNTER — Ambulatory Visit: Payer: Medicaid Other | Admitting: Gynecologic Oncology

## 2016-01-08 ENCOUNTER — Other Ambulatory Visit (HOSPITAL_BASED_OUTPATIENT_CLINIC_OR_DEPARTMENT_OTHER): Payer: Medicaid Other

## 2016-01-08 ENCOUNTER — Encounter: Payer: Self-pay | Admitting: Gynecologic Oncology

## 2016-01-08 ENCOUNTER — Ambulatory Visit: Payer: Medicaid Other | Attending: Gynecologic Oncology | Admitting: Gynecologic Oncology

## 2016-01-08 VITALS — BP 118/89 | HR 75 | Temp 98.5°F | Resp 18 | Ht <= 58 in | Wt 96.9 lb

## 2016-01-08 DIAGNOSIS — N12 Tubulo-interstitial nephritis, not specified as acute or chronic: Secondary | ICD-10-CM | POA: Insufficient documentation

## 2016-01-08 DIAGNOSIS — Z91018 Allergy to other foods: Secondary | ICD-10-CM | POA: Insufficient documentation

## 2016-01-08 DIAGNOSIS — Z9221 Personal history of antineoplastic chemotherapy: Secondary | ICD-10-CM | POA: Diagnosis not present

## 2016-01-08 DIAGNOSIS — Z888 Allergy status to other drugs, medicaments and biological substances status: Secondary | ICD-10-CM | POA: Diagnosis not present

## 2016-01-08 DIAGNOSIS — O019 Hydatidiform mole, unspecified: Secondary | ICD-10-CM

## 2016-01-08 DIAGNOSIS — R918 Other nonspecific abnormal finding of lung field: Secondary | ICD-10-CM | POA: Insufficient documentation

## 2016-01-08 DIAGNOSIS — O010 Classical hydatidiform mole: Secondary | ICD-10-CM | POA: Insufficient documentation

## 2016-01-08 NOTE — Progress Notes (Signed)
Followup Note: Gyn-Onc  Consult was initially requested by Dr. Ihor Dow for the evaluation of Shelly Koch 25 y.o. female with post (term) pregnancy GTN  CC:  Chief Complaint  Patient presents with  . Gestational Tropoblastic Neoplasm    Follow up    Assessment/Plan:  Shelly Koch  is a 25 y.o.  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 Complete response on 01/09/15 with second line therapy (Act-D) after initial failure to MTX.  Stable, normal, non-measurable HCG's x 12 months post therapy.  No evidence of recurrence.  I discussed with Shelly Koch that Shelly Koch does not require ongoing surveillance for GTN. Shelly Koch can discontinue birth control and attempt conception.  I reminded her that Shelly Koch is at increased risk for recurrent molar pregnancy and should have early first trimester confirmatory [place] when Shelly Koch conceives. I discussed that her placenta should be evaluate by pathology after delivery.  Shelly Koch will follow-up with Dr Ihor Dow in 6-81months or sooner with conception.  HPI: Shelly Koch is a 25 year old gravida 1 para 1 who is 3 months status post delivery of a full-term female infant on 06/05/2014. This pregnancy was complicated by a twin pregnancy with loss of the second twin at a proximally [redacted] weeks gestational age. Shelly Koch also had gestational hypertension during this pregnancy. Her delivery was vaginal and per patient was uncomplicated with no retained placenta in the third stage of labor.  In December 2015 when Shelly Koch was one month postpartum Shelly Koch was experiencing abdominal pain and was seen in the emergency room for this. HCG levels were drawn at this time and was elevated.   HCG 07/04/14: 389  Shelly Koch was recommended to follow-up with Dr. Purvis Kilts for this elevated hCG and abdominal pain and Shelly Koch did so. In January Shelly Koch sore Dr. Purvis Kilts to repeated the hCG demonstrating that it had increased substantially. HCG 07/26/14: [id number] HCG  07/30/14: 1922   To follow-up these rises in hCG Shelly Koch also underwent ultrasound scan. Ultrasound 08/02/14:  No intrauterine gestational sac identified. A focal hyperechoic area is seen within the central endometrium which measures 1.1 x 0.5 x 0.8 cm. This has decreased since prior study, but is suspicious for a persistent small focus of retained products of conception. No myometrial masses are identified.  The right ovary is normal in appearance. A complex cystic lesion is again seen in the left ovary which contains a fine reticular pattern of internal echoes, without blood flow on color Doppler ultrasound. This measures 3.3 x 2.5 x 2.7 cm, and has findings consistent with a benign hemorrhagic cyst.   It was felt that the elevated hCG and ultrasound findings were representative retained products of conception. Therefore Shelly Koch underwent a D&C hysteroscopy 08/10/14: 6 week size uterus with small amount of tissue in cavity.  Postprocedure hCG levels were drawn and were surprisingly double what they had been 1 month prior. HCG 08/25/14: 4856  To follow-up these doubling hCG levels Shelly Koch underwent CT evaluation of the chest abdomen and pelvis to look for metastatic foci of trophoblastic tissue. CT chest/abdo/pelvis 09/09/14 : A few tiny sub-cm pulmonary nodules are seen scattered in the left upper and lower lobes, most likely postinflammatory in etiology in patient of this age. Normal/unremarkable uterus tubes and ovaries. No evidence for metastatic disease.  The patient was referred to Dr. Marko Plume for chemotherapy after Shelly Koch experiences plateauing in the fourth for hCGs. Shelly Koch was diagnosed as low risk metastatic (small pulmonary nodules which were equivocal) post-molar  GTN. Shelly Koch was treated initially with single agent methotrexate, weekly (first cycle 09/20/14). In April 2016 after receiving her seventh dose of methotrexate her hCG levels plateaued at 2693 on 11/11/14. This was an elevation from 2004.7 the  week previously on 10/31/2014. Shelly Koch underwent restaging CT of the chest abdomen and pelvis on 11/10/2014 which showed no progressive or metastatic disease. Her chemotherapy regimen was changed to actinomycin D beginning on 11/14/2014 with hCG levels at 3275 on cycle 1 of active. Shelly Koch received a total of 5 cycles of active with the fifth being administered on 01/09/2015 after Shelly Koch had experienced 3 normal (less than 2) hCG levels prior to this. On 01/16/2015 1 week after her final cycle of active her hCG continued to be normal at less than 2 representing the fifth value in this range.  HCG's have been checked monthly since completing chemotherapy in June, 2016. These have all been <2 (undetectable). The last value (prior to today) was drawn on 09/20/15 (and today - pending).  Ultrasound in April, 2017 was negative for pathology as part of workup for pelvic pain.  HCG on June 2nd, 2017 was normal at <1.  Interval History: Shelly Koch has no concerns and has resumed menses.  Current Meds:  No outpatient encounter prescriptions on file as of 01/08/2016.   Facility-Administered Encounter Medications as of 01/08/2016  Medication  . heparin lock flush 100 unit/mL  . medroxyPROGESTERone (DEPO-PROVERA) injection 150 mg  . sodium chloride 0.9 % injection 10 mL    Allergy:  Allergies  Allergen Reactions  . Ginger Hives and Itching    Pt states "not allergic"  . [place]     Hives and itching  . Ondansetron Hives and Itching  . Phenergan [Promethazine] Hives and Itching    Note: pt tolerates PO COMPAZINE  . Zofran [Ondansetron Hcl] Rash    Social Hx:   Social History   Social History  . Marital Status: Single    Spouse Name: N/A  . Number of Children: N/A  . Years of Education: N/A   Occupational History  . Not on file.   Social History Main Topics  . Smoking status: Never Smoker   . Smokeless tobacco: Never Used  . Alcohol Use: No  . Drug Use: No  . Sexual Activity:    Partners: Male    Birth  Control/ Protection: None     Comment: Stopped Depo in November; trying for pregnancy   Other Topics Concern  . Not on file   Social History Narrative    Past Surgical Hx:  Past Surgical History  Procedure Laterality Date  . No past surgeries    . Dilation and evacuation N/A 08/10/2014    Procedure: DILATATION AND EVACUATION;  Surgeon: Lavonia Drafts, MD;  Location: [facility];  Service: Gynecology;  Laterality: N/A;  . Hysteroscopy N/A 08/10/2014    Procedure: HYSTEROSCOPY;  Surgeon: Lavonia Drafts, MD;  Location: [facility];  Service: Gynecology;  Laterality: N/A;    Past Medical Hx:  Past Medical History  Diagnosis Date  . Pyelonephritis     Past Gynecological History:  SVD x 1 (06/05/14)  No LMP recorded. Patient is not currently having periods (Reason: Oral contraceptives).  Family Hx:  Family History  Problem Relation Age of Onset  . Alcohol abuse Neg Hx     Review of Systems:  Constitutional  Feels well,    ENT Normal appearing ears and nares bilaterally Skin/Breast  No rash, sores, jaundice, itching, dryness Cardiovascular  No chest  pain, shortness of breath, or edema  Pulmonary  No cough or wheeze.  Gastro Intestinal  No nausea, vomitting, or diarrhoea. No bright red blood per rectum, occasional abdominal pain, no change in bowel movement.  Genito Urinary  No frequency, urgency, dysuria,  Musculo Skeletal  No myalgia, arthralgia, joint swelling or pain  Neurologic  No weakness, numbness, change in gait,  Psychology  No depression, anxiety, insomnia.   Vitals:  Blood pressure 118/89, pulse 75, temperature 98.5 F (36.9 C), temperature source Oral, resp. rate 18, height 4\' 10"  (1.473 m), weight 96 lb 14.4 oz (43.954 kg), SpO2 100 %, not currently breastfeeding.  Physical Exam: WD in NAD Neck  Supple NROM, without any enlargements.  Lymph Node Survey No cervical supraclavicular or inguinal adenopathy Cardiovascular  Pulse normal rate,  regularity and rhythm. S1 and S2 normal.  Lungs  Clear to auscultation bilateraly, without wheezes/crackles/rhonchi. Good air movement.  Skin  No rash/lesions/breakdown.  Psychiatry  Alert and oriented to person, place, and time  Abdomen  Normoactive bowel sounds, abdomen soft, non-tender and thin without evidence of hernia.  Back No CVA tenderness Genito Urinary  Vulva/vagina: Normal external female genitalia.  No lesions. No discharge or bleeding.  Bladder/urethra:  No lesions or masses, well supported bladder  Vagina: no lesions visible  Cervix: Normal appearing, no lesions.  Uterus:  Small, mobile, no parametrial involvement or nodularity.  Adnexa: no palpable masses. Rectal  Good tone, no masses no cul de sac nodularity.  Extremities  No bilateral cyanosis, clubbing or edema.   Donaciano Eva, MD   01/08/2016, 2:52 PM  Cc: Dr Marko Plume, Dr Ihor Dow

## 2016-01-08 NOTE — Patient Instructions (Signed)
No follow up with Dr Everitt Amber required  Plan to follow up with your OB/GYN in 6 - 12 months for your annual exam , follow up sooner if you become pregnant.  Thank you !

## 2016-01-09 ENCOUNTER — Telehealth: Payer: Self-pay

## 2016-01-09 LAB — BETA HCG QUANT (REF LAB)

## 2016-01-09 NOTE — Telephone Encounter (Signed)
Orders received from Petros cross, APNP to contact the patient to update with HCG Tumor Marker results (<1 ) , "WNL" . Patient contacted and updated with results , patient states understanding , denies further questions at this time .

## 2016-01-15 ENCOUNTER — Other Ambulatory Visit: Payer: Medicaid Other

## 2016-01-15 ENCOUNTER — Ambulatory Visit: Payer: Medicaid Other | Admitting: Gynecologic Oncology

## 2016-02-12 ENCOUNTER — Other Ambulatory Visit: Payer: Medicaid Other

## 2016-02-19 ENCOUNTER — Encounter: Payer: Self-pay | Admitting: Family Medicine

## 2016-02-19 ENCOUNTER — Ambulatory Visit (INDEPENDENT_AMBULATORY_CARE_PROVIDER_SITE_OTHER): Payer: Medicaid Other | Admitting: Family Medicine

## 2016-02-19 VITALS — BP 98/58 | HR 68 | Temp 98.7°F | Ht <= 58 in | Wt 98.4 lb

## 2016-02-19 DIAGNOSIS — R1084 Generalized abdominal pain: Secondary | ICD-10-CM | POA: Diagnosis not present

## 2016-02-19 DIAGNOSIS — H60391 Other infective otitis externa, right ear: Secondary | ICD-10-CM | POA: Diagnosis not present

## 2016-02-19 DIAGNOSIS — N926 Irregular menstruation, unspecified: Secondary | ICD-10-CM

## 2016-02-19 DIAGNOSIS — H6121 Impacted cerumen, right ear: Secondary | ICD-10-CM | POA: Diagnosis not present

## 2016-02-19 LAB — POCT URINALYSIS DIPSTICK
BILIRUBIN UA: NEGATIVE
GLUCOSE UA: NEGATIVE
Ketones, UA: NEGATIVE
LEUKOCYTES UA: NEGATIVE
NITRITE UA: NEGATIVE
Protein, UA: NEGATIVE
RBC UA: NEGATIVE
Spec Grav, UA: >=1.03
UROBILINOGEN UA: NEGATIVE
pH, UA: 6

## 2016-02-19 LAB — POCT URINE PREGNANCY: PREG TEST UR: NEGATIVE

## 2016-02-19 MED ORDER — NEOMYCIN-POLYMYXIN-HC 3.5-10000-1 OT SOLN: 3.0000 [drp] | Freq: Four times a day (QID) | OTIC | 0 refills | Status: DC

## 2016-02-19 NOTE — Patient Instructions (Signed)
   Start taking prenatal vitamins every day.  Take this since you aren't using contraception to prevent pregnancy. Your pelvic exam was normal--the cervix appeared normal, no evidence of infection or other reason for abnormal bleeding. I suspect that your have abnormal bleeding since you always had irregular periods prior to being on Depo Provera injections, and you haven't really starting back to having regular cycles again since stopping the injections.  Your urine test and pregnancy test were both negative today (no bladder infection, not pregnant).  We tried our best to get the hard ball of wax out of your right ear today.  We got only part, but stopped due to the discomfort it was causing you. It appears that the ear canal was somewhat inflamed past the wax, so we are going to treat you with an antibiotic ear drop (that also has some cortisone to help with inflammation and itching) for a week. After using this prescription for a week, get some Debrox drops for wax removal.  This is an over-the-counter drop (or any other wax softening/removing drops that the pharmacist recommends). Use this for a week, and then schedule another appointment, at which time we can try again to remove the remaining wax.

## 2016-02-19 NOTE — Progress Notes (Signed)
Chief Complaint  Patient presents with  . Vaginal Bleeding    and abdominal pain 2-3 after having sexual intercourse for the last 3 weeks.   . Ear Fullness    left ear is itching and sometime painful.   Patients presents with complaint of abnormal vaginal bleeding.  She reports being in a monogamous relationship x 5 years.  In the last 3 weeks she has noted bleeding after intercourse. Upon further questioning, history is as follows: Unprotected sex 3 weeks ago.  A week later she noticed some cramping and brown spotting.  3 or 4 days ago she also noted spotting, having had intercourse the night before.  That happened one additional time with spotting the morning after intercourse.  LMP--unsure of last regular period. Reports, as above, bleeding starting on 7/11, lasting x 3 days. She previously took Depo Provera shots, last injection was in February.Prior to being on Depo Provera, she recalls her cycles being irregular.  She is wondering if she could be pregnant. Denies breast tenderness or nausea, some fatigue. Vaginal discharge has been white, "normal", until this morning where she noted some brown mixed with white. Doesn't take prenatal vitamins  Last pap smear was 11/2013: Adequacy Reason Satisfactory for evaluation, endocervical/transformation zone component ABSENT. Diagnosis NEGATIVE FOR INTRAEPITHELIAL LESIONS OR MALIGNANCY.  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  She is complaining of right ear feeling itchy, feels like there is water in her ear, it is painful, and causing a headache. Hearing is muffled. She also reports she has wax in that ear.   PMH, PSH, SH reviewed  Meds: ibuprofen as needed for ear pain Allergies  Allergen Reactions  . Ginger Hives and Itching    Pt states "not allergic"  . Okra     Hives and itching  . Ondansetron Hives  and Itching  . Phenergan [Promethazine] Hives and Itching    Note: pt tolerates PO COMPAZINE  . Zofran [Ondansetron Hcl] Rash   ROS: no fever, chils, cough/congestion, sore throat, chest pain, nausea, vomiting, diarrhea, urinary complaints, vaginal odor, pelvic pain (slight intermittent cramp on the left). No bruising, rash or other concerns.  PHYSICAL EXAM: BP (!) 98/58 (BP Location: Left Arm, Patient Position: Sitting, Cuff Size: Normal)   Pulse 68   Temp 98.7 F (37.1 C) (Tympanic)   Ht 4' 9.5" (1.461 m)   Wt 98 lb 6.4 oz (44.6 kg)   LMP  (LMP Unknown)   BMI 20.92 kg/m   Pleasant, petite female in no distress HEENT: PERRL, EOMI, conjunctiva and sclera are clear. OP clear, nasal mucosa normal. Right ear:  Hard ball of cerumen in the right EAC. Adherent to wall and unable to be removed with curette. Ear was soaked and lavaged--only small pieces of wax were dislodged, large amount still left in the canal. There appeared to be some inflammation past (proximal in the canal) to the cerumen.  Visualized portion of TM appeared normal. Neck: no lymphadenopathy or mass Heart; regular rate and rhythm Lungs: clear bilaterally Abdomen: soft, nontender, no organomegaly or mass Back: no spinal or CVA tenderness Genital: normal external genitalia without lesion. Speculum exam revealed normal appearing cervix without lesions or bleeding. No abnormal discharge. Small amount of brown discharge in the vaginal vault. Bimanual exam--very slightly tender at left adnexa, no rash. Uterus is normal, nontender, no cervical motion tenderness  Urine dip normal Urine  pregnancy negative  ASSESSMENT/PLAN:  Cerumen impaction, right - unable to completely clear.  To use OTC drops and f/u for repeat lavage after a week, if needed  Generalized abdominal pain - intermittent--only minimal tenderness at L adnexa; continue ibuprofefn prn - Plan: POCT Urinalysis Dipstick, POCT urine pregnancy  Otitis, externa,  infective, right - some inflammation noted in canal, as well as pain--treat with ABX/HC, then can try Debrox and additional lavage later - Plan: neomycin-polymyxin-hydrocortisone (CORTISPORIN) otic solution  Irregular menses - reassurred, keep calendar; start PNV if not using contraception    Start taking prenatal vitamins every day.  Take this since you aren't using contraception to prevent pregnancy. Your pelvic exam was normal--the cervix appeared normal, no evidence of infection or other reason for abnormal bleeding. I suspect that your have abnormal bleeding since you always had irregular period prior to being on Depo Provera injections, and you haven't really starting back to having regular cycles again since stopping the injections.  Your urine test and pregnancy test were both negative today (no bladder infection, not pregnant).  We tried our best to get the hard ball of wax out of your right ear today.  We got only part, but stopped due to the discomfort it was causing you. It appears that the canal was somewhat inflamed past the wax, so we are going to treat you with an antibiotic ear drop (that also has some cortisone to help with inflammation and itching) for a week. After using this prescription for a week, get some Debrox drops for wax removal.  This is an over-the-counter drop (or any other wax softening/removing drops that the pharmacist recommends). Use this for a week, and then schedule another appointment, at which time we can try again to remove the remaining wax.

## 2016-02-20 ENCOUNTER — Encounter: Payer: Self-pay | Admitting: Family Medicine

## 2016-02-26 ENCOUNTER — Telehealth: Payer: Self-pay | Admitting: Family Medicine

## 2016-02-26 NOTE — Telephone Encounter (Signed)
Pt called stating that she is still having vaginal bleeding that starts with sex. Pt is concerned due to her history of cancer and want to be seen by her OBGYN today if possible since that is what she was told would be the next step.

## 2016-02-26 NOTE — Telephone Encounter (Signed)
Attempted call to [facility]- 1st attempted waited 16 minutes for answer. Attempted call to pt x2 and placed on hold for 35 minutes then at 4:00PM was connected to after hours services as phone to office were tured off at 4pm. Left message with pt coordinator that answered to have a nurse call pt back.   Pt was notified to stay by phone for nurse to CB. Advised pt to call in morning for update. Victorino December

## 2016-02-26 NOTE — Telephone Encounter (Signed)
Yes, get her back into see gyn.

## 2016-02-27 ENCOUNTER — Telehealth: Payer: Self-pay

## 2016-02-27 NOTE — Telephone Encounter (Signed)
Error

## 2016-03-05 ENCOUNTER — Encounter: Payer: Self-pay | Admitting: *Deleted

## 2016-03-27 ENCOUNTER — Other Ambulatory Visit (HOSPITAL_COMMUNITY)
Admission: RE | Admit: 2016-03-27 | Discharge: 2016-03-27 | Disposition: A | Discharge status: Home / Self Care | Payer: Medicaid Other | Source: Ambulatory Visit | Attending: Obstetrics & Gynecology | Admitting: Obstetrics & Gynecology

## 2016-03-27 ENCOUNTER — Ambulatory Visit (INDEPENDENT_AMBULATORY_CARE_PROVIDER_SITE_OTHER): Payer: Medicaid Other | Admitting: Obstetrics & Gynecology

## 2016-03-27 ENCOUNTER — Encounter: Payer: Self-pay | Admitting: Obstetrics & Gynecology

## 2016-03-27 VITALS — BP 126/90 | HR 89 | Ht <= 58 in | Wt 96.0 lb

## 2016-03-27 DIAGNOSIS — Z01419 Encounter for gynecological examination (general) (routine) without abnormal findings: Secondary | ICD-10-CM | POA: Insufficient documentation

## 2016-03-27 DIAGNOSIS — R102 Pelvic and perineal pain: Secondary | ICD-10-CM | POA: Diagnosis not present

## 2016-03-27 DIAGNOSIS — N930 Postcoital and contact bleeding: Secondary | ICD-10-CM | POA: Diagnosis not present

## 2016-03-27 DIAGNOSIS — Z113 Encounter for screening for infections with a predominantly sexual mode of transmission: Secondary | ICD-10-CM | POA: Insufficient documentation

## 2016-03-27 DIAGNOSIS — Z124 Encounter for screening for malignant neoplasm of cervix: Secondary | ICD-10-CM | POA: Diagnosis not present

## 2016-03-27 DIAGNOSIS — N939 Abnormal uterine and vaginal bleeding, unspecified: Secondary | ICD-10-CM

## 2016-03-27 LAB — POCT PREGNANCY, URINE: PREG TEST UR: NEGATIVE

## 2016-03-27 LAB — TSH: TSH: 2.14 mIU/L

## 2016-03-27 NOTE — Progress Notes (Signed)
History:  25 y.o. G1P1001 here today for AUB. LMP 828/2017 for 1 day.  She reports menses q 3-4 months that last only 1 day. Pt does bleed after intercourse. It is light pink. 1 month ago pt had intercourse. She had spotting for 2-3 weeks then stopped then had 1 day of bleeding that she thinks was her menses.  She now reports only spotting the day of intercourse with no prolonged spotting.  Pt started her period on the 28th for 1 day.  She is hoping to conceive.  She reports BM daily.    The following portions of the patient's history were reviewed and updated as appropriate: allergies, current medications, past family history, past medical history, past social history, past surgical history and problem list.  Review of Systems:  Pertinent items are noted in HPI.  Objective:  Physical Exam Blood pressure 126/90, pulse 89, height 4\' 10"  (1.473 m), weight 96 lb (43.5 kg), last menstrual period 03/21/2016, not currently breastfeeding. Gen: NAD Lungs: CTA CV: RRR Abd: Soft, nontender and nondistended Pelvic: Normal appearing external genitalia; normal appearing vaginal mucosa and cervix.  Normal discharge.  Small uterus, no other palpable masses, no uterine or adnexal tenderness  Labs and Imaging 10/30/2015 CLINICAL DATA:  Pelvic pain. History of gestational trophoblastic disease.  EXAM: TRANSABDOMINAL AND TRANSVAGINAL ULTRASOUND OF PELVIS  TECHNIQUE: Both transabdominal and transvaginal ultrasound examinations of the pelvis were performed. Transabdominal technique was performed for global imaging of the pelvis including uterus, ovaries, adnexal regions, and pelvic cul-de-sac. It was necessary to proceed with endovaginal exam following the transabdominal exam to visualize the uterus and ovaries.  COMPARISON:  Ultrasound report 09/25/2014.  FINDINGS: Uterus  Measurements: 6.5 x 3.3 x 5.3 cm. No fibroids or other mass visualized.  Endometrium  Thickness: 2.5 mm.  No focal  abnormality visualized.  Right ovary  Measurements: 3.2 x 1.8 x 1.6 cm. Small follicles noted.  Left ovary  Measurements: 3.2 x 1.2 x 2.0 cm. Small follicles noted.  Other findings  No abnormal free fluid.  IMPRESSION: No focal abnormality identified. Small bilateral ovarian follicles are noted. Polycystic ovarian disease cannot be entirely excluded.   Assessment & Plan:  AUB and post coital bleeding in pt with h/o GTD chemo (MTX and Actinomycin D) completed in June 2016.    Need to assess ovarian function given chemo.   Pt also c/o pain in LLQ which was present prev- prev imaging haas been normal .      Labs: Quant HCG, TSH, FSH and Prolactin today Repeat TV sono F/u in 1 month- pt to bring bleeding calendar with her Pap done today  Gabriella Guile L. Harraway-Smith, M.D., Cherlynn June

## 2016-03-27 NOTE — Patient Instructions (Signed)
Secondary Amenorrhea Secondary amenorrhea is the stopping of menstrual flow for 3-6 months in a female who has previously had periods. There are many possible causes. Most of these causes are not serious. Usually, treating the underlying problem causing the loss of menses will return your periods to normal. CAUSES  Some common and uncommon causes of not menstruating include:  Malnutrition.  Low blood sugar (hypoglycemia).  Polycystic ovary disease.  Stress or fear.  Breastfeeding.  Hormone imbalance.  Ovarian failure.  Medicines.  Extreme obesity.  Cystic fibrosis.  Low body weight or drastic weight reduction from any cause.  Early menopause.  Removal of ovaries or uterus.  Contraceptives.  Illness.  Long-term (chronic) illnesses.  Cushing syndrome.  Thyroid problems.  Birth control pills, patches, or vaginal rings for birth control. RISK FACTORS You may be at greater risk of secondary amenorrhea if:  You have a family history of this condition.  You have an eating disorder.  You do athletic training. DIAGNOSIS  A diagnosis is made by your health care provider taking a medical history and doing a physical exam. This will include a pelvic exam to check for problems with your reproductive organs. Pregnancy must be ruled out. Often, numerous blood tests are done to measure different hormones in the body. Urine testing may be done. Specialized exams (ultrasound, CT scan, MRI, or hysteroscopy) may have to be done as well as measuring the body mass index (BMI). TREATMENT  Treatment depends on the cause of the amenorrhea. If an eating disorder is present, this can be treated with an adequate diet and therapy. Chronic illnesses may improve with treatment of the illness. Amenorrhea may be corrected with medicines, lifestyle changes, or surgery. If the amenorrhea cannot be corrected, it is sometimes possible to create a false menstruation with medicines. HOME CARE  INSTRUCTIONS  Maintain a healthy diet.  Manage weight problems.  Exercise regularly but not excessively.  Get adequate sleep.  Manage stress.  Be aware of changes in your menstrual cycle. Keep a record of when your periods occur. Note the date your period starts, how long it lasts, and any problems. SEEK MEDICAL CARE IF: Your symptoms do not get better with treatment.   This information is not intended to replace advice given to you by your health care provider. Make sure you discuss any questions you have with your health care provider.   Document Released: 08/19/2006 Document Revised: 07/29/2014 Document Reviewed: 12/24/2012 Elsevier Interactive Patient Education [employer].

## 2016-03-28 ENCOUNTER — Telehealth: Payer: Self-pay | Admitting: General Practice

## 2016-03-28 LAB — GC/CHLAMYDIA PROBE AMP (~~LOC~~) NOT AT ARMC
Chlamydia: NEGATIVE
Neisseria Gonorrhea: NEGATIVE

## 2016-03-28 LAB — HCG, QUANTITATIVE, PREGNANCY

## 2016-03-28 LAB — PROLACTIN: Prolactin: 6 ng/mL

## 2016-03-28 LAB — FOLLICLE STIMULATING HORMONE: FSH: 6.3 m[IU]/mL

## 2016-03-28 LAB — CYTOLOGY - PAP

## 2016-03-28 NOTE — Telephone Encounter (Signed)
Per Dr Ihor Dow, patient's blood levels were normal. Called & informed patient. Patient verbalized understanding & had no questions

## 2016-04-05 ENCOUNTER — Ambulatory Visit (HOSPITAL_COMMUNITY)
Admission: RE | Admit: 2016-04-05 | Discharge: 2016-04-05 | Disposition: A | Discharge status: Home / Self Care | Payer: Medicaid Other | Source: Ambulatory Visit | Attending: Obstetrics & Gynecology | Admitting: Obstetrics & Gynecology

## 2016-04-05 DIAGNOSIS — N939 Abnormal uterine and vaginal bleeding, unspecified: Secondary | ICD-10-CM | POA: Diagnosis not present

## 2016-04-05 DIAGNOSIS — R102 Pelvic and perineal pain: Secondary | ICD-10-CM | POA: Insufficient documentation

## 2016-04-05 DIAGNOSIS — R938 Abnormal findings on diagnostic imaging of other specified body structures: Secondary | ICD-10-CM | POA: Diagnosis not present

## 2016-04-05 DIAGNOSIS — N930 Postcoital and contact bleeding: Secondary | ICD-10-CM

## 2016-04-09 ENCOUNTER — Telehealth: Payer: Self-pay | Admitting: General Practice

## 2016-04-09 NOTE — Telephone Encounter (Signed)
Per Dr Ihor Dow, patient's ultrasound was normal. Called patient & informed her. Patient verbalized understanding & had no questions

## 2016-05-01 ENCOUNTER — Ambulatory Visit (INDEPENDENT_AMBULATORY_CARE_PROVIDER_SITE_OTHER): Payer: Medicaid Other | Admitting: Family Medicine

## 2016-05-01 ENCOUNTER — Encounter: Payer: Self-pay | Admitting: Family Medicine

## 2016-05-01 VITALS — BP 112/80 | HR 92 | Ht <= 58 in | Wt 95.8 lb

## 2016-05-01 DIAGNOSIS — Z23 Encounter for immunization: Secondary | ICD-10-CM

## 2016-05-01 DIAGNOSIS — E282 Polycystic ovarian syndrome: Secondary | ICD-10-CM

## 2016-05-01 DIAGNOSIS — R6884 Jaw pain: Secondary | ICD-10-CM | POA: Diagnosis not present

## 2016-05-01 DIAGNOSIS — N970 Female infertility associated with anovulation: Secondary | ICD-10-CM

## 2016-05-01 MED ORDER — METFORMIN HCL ER 500 MG PO TB24: ORAL_TABLET | ORAL | 5 refills | Status: DC

## 2016-05-01 NOTE — Progress Notes (Signed)
Chief Complaint  Patient presents with  . Advice Only    discuss birth control options. Trying to conceive but her periods are irregular, wondering if starting pill for a while will help to regluate her periods to help her eventually?  . Dental Pain    also lower right tooth pain that started Monday night after she called to schedule this appt.    Periods are irregular, sometimes skipping 3-4 months. LMP 10/8.  Prior to this, last period was 8/30. Prior to that was about 4 months between periods.  Irregular menses since age 53.  She reports trying clomid in the past--resulted in a twin pregnancy.  The second didn't make it, and she delivered her daughter as a result of this pregnancy. She is hesitant to do this again, afraid for another multiple pregnancy. She made appointment requesting birth control pills, as this would regulate her cycles. She is trying for pregnancy.  She saw GYN for post-coital bleeding and irregular menses last month.  Pap smear was normal.  She also had ultrasound:  IMPRESSION: 1. Normal size and contour of the uterus. The endometrial stripe is not abnormally thickened. If bleeding remains unresponsive to hormonal or medical therapy, sonohysterogram should be considered for focal lesion work-up. (Ref: Radiological Reasoning: Algorithmic Workup of Abnormal Vaginal Bleeding with Endovaginal Sonography and Sonohysterography. AJR 2008; [id number]) 2. Probable polycystic ovarian syndrome. No solid-appearing Ob variant masses. No adnexal masses or free pelvic fluid.  She is also complaining of right lower jaw toothache x 3 days. She goes to the dentist twice a year, but thinks her last visit was January or February. She feels like the skin/gum is lifting up.  Denies any fever, chills, drainage. Denies known trauma/injury  PMH, PSH, SH reviewed  Current Outpatient Prescriptions on File Prior to Visit  Medication Sig Dispense Refill  . prenatal vitamin w/FE, FA (PRENATAL  1 + 1) 27-1 MG TABS tablet Take 1 tablet by mouth daily at 12 noon.     Current Facility-Administered Medications on File Prior to Visit  Medication Dose Route Frequency Provider Last Rate Last Dose  . heparin lock flush 100 unit/mL  500 Units Intravenous Once Lennis P Livesay, MD      . sodium chloride 0.9 % injection 10 mL  10 mL Intravenous PRN Lennis Marion Downer, MD       ROS: no fever, chills, URI symptoms, nausea, vomiting, diarrhea.  +irregular menses, dental pain per HPI  PHYSICAL EXAM:  BP 112/80 (BP Location: Left Arm, Patient Position: Sitting, Cuff Size: Normal)   Pulse 92   Ht 4\' 10"  (1.473 m)   Wt 95 lb 12.8 oz (43.5 kg)   BMI 20.02 kg/m   Well appearing, pleasant female in no distress HEENT: PERRL, EOMI, conjunctiva and sclera are clear. OP is clear-- Right lower jaw--just behind the last tooth there is slight separation of the gum from the tooth which is very tender.  There is no erythema, no significant swelling, redness.  No significant swelling visible externally, no erythema, warmth Neck: no lymphadenopathy or mass Psych: normal mood, affect, hygiene and grooming Neuro: alert and oriented, cranial nerves intact, normal gait.  ASSESSMENT/PLAN:  Infertility, anovulation - Plan: metFORMIN (GLUCOPHAGE-XR) 500 MG 24 hr tablet  Need for prophylactic vaccination and inoculation against influenza - Plan: Flu Vaccine QUAD 36+ mos PF IM (Fluarix & Fluzone Quad PF)  PCOS (polycystic ovarian syndrome) - Plan: metFORMIN (GLUCOPHAGE-XR) 500 MG 24 hr tablet  Jaw pain, non-TMJ - mouthwash/keep clean;  anbesol and tylenol or ibuprofen prn.  f/u with dentist with worsening.  Discussed OCP's--potentially could help regulate her cycle while taking, perhaps for a while afterwards. Given that she is trying for pregnancy, this wouldn't be my first recommendation.  Would use Metformin to help induce ovulation with her suspected PCOS instead, since the desired effect is pregnancy (not just  regular periods).     Based on your ultrasound, and irregular menses, I suspect that you have PCOS and are not ovulating regularly, contributing to your difficulty in getting pregnant.  We are going to try using Metformin to see if it helps cause ovulation. We need to titrate this up slowly to avoid stomach upset.  Start taking 500mg  tablet once daily with breakfast.  If after a week you aren't having any side effects (diarrhea, stomach upset), then increase to 2 tablets with breakfast.  If tolerating, further increase to 3 tablets with breakfast.  If at the higher doses, it bothers your stomach more, you can split the pills up (ie take it 2-3 times/day rather than taking them all together).   The usual recommended doses for help with ovulation in PCOS patients is 1500-2000mg .  You might want to discuss this further with your OB-GYN if not tolerating or if not successful in getting pregnant. Continue your prenatal vitamin daily.  25 min visit, more than 1/2 spent counseling.  CC chart to GYN--at end of visit she mentioned that she has upcoming appointment, later this week.

## 2016-05-01 NOTE — Patient Instructions (Addendum)
Based on your ultrasound, and irregular menses, I suspect that you have PCOS and are not ovulating regularly, contributing to your difficulty in getting pregnant.  We are going to try using Metformin to see if it helps cause ovulation. We need to titrate this up slowly to avoid stomach upset.  Start taking 500mg  tablet once daily with breakfast.  If after a week you aren't having any side effects (diarrhea, stomach upset), then increase to 2 tablets with breakfast.  If tolerating, further increase to 3 tablets with breakfast.  If at the higher doses, it bothers your stomach more, you can split the pills up (ie take it 2-3 times/day rather than taking them all together).   The usual recommended doses for help with ovulation in PCOS patients is 1500-2000mg .  You might want to discuss this further with your OB-GYN if not tolerating or if not successful in getting pregnant. Continue your prenatal vitamin daily.   Polycystic Ovarian Syndrome Polycystic ovarian syndrome (PCOS) is a common hormonal disorder among women of reproductive age. Most women with PCOS grow many small cysts on their ovaries. PCOS can cause problems with your periods and make it difficult to get pregnant. It can also cause an increased risk of miscarriage with pregnancy. If left untreated, PCOS can lead to serious health problems, such as diabetes and heart disease. CAUSES The cause of PCOS is not fully understood, but genetics may be a factor. SIGNS AND SYMPTOMS   Infrequent or no menstrual periods.   Inability to get pregnant (infertility) because of not ovulating.   Increased growth of hair on the face, chest, stomach, back, thumbs, thighs, or toes.   Acne, oily skin, or dandruff.   Pelvic pain.   Weight gain or obesity, usually carrying extra weight around the waist.   Type 2 diabetes.   High cholesterol.   High blood pressure.   Female-pattern baldness or thinning hair.   Patches of thickened and  dark brown or black skin on the neck, arms, breasts, or thighs.   Tiny excess flaps of skin (skin tags) in the armpits or neck area.   Excessive snoring and having breathing stop at times while asleep (sleep apnea).   Deepening of the voice.   Gestational diabetes when pregnant.  DIAGNOSIS  There is no single test to diagnose PCOS.   Your health care provider will:   Take a medical history.   Perform a pelvic exam.   Have ultrasonography done.   Check your female and female hormone levels.   Measure glucose or sugar levels in the blood.   Do other blood tests.   If you are producing too many female hormones, your health care provider will make sure it is from PCOS. At the physical exam, your health care provider will want to evaluate the areas of increased hair growth. Try to allow natural hair growth for a few days before the visit.   During a pelvic exam, the ovaries may be enlarged or swollen because of the increased number of small cysts. This can be seen more easily by using vaginal ultrasonography or screening to examine the ovaries and lining of the uterus (endometrium) for cysts. The uterine lining may become thicker if you have not been having a regular period.  TREATMENT  Because there is no cure for PCOS, it needs to be managed to prevent problems. Treatments are based on your symptoms. Treatment is also based on whether you want to have a baby or whether you need  contraception.  Treatment may include:   Progesterone hormone to start a menstrual period.   Birth control pills to make you have regular menstrual periods.   Medicines to make you ovulate, if you want to get pregnant.   Medicines to control your insulin.   Medicine to control your blood pressure.   Medicine and diet to control your high cholesterol and triglycerides in your blood.  Medicine to reduce excessive hair growth.  Surgery, making small holes in the ovary, to decrease the  amount of female hormone production. This is done through a long, lighted tube (laparoscope) placed into the pelvis through a tiny incision in the lower abdomen.  HOME CARE INSTRUCTIONS  Only take over-the-counter or prescription medicine as directed by your health care provider.  Pay attention to the foods you eat and your activity levels. This can help reduce the effects of PCOS.  Keep your weight under control.  Eat foods that are low in carbohydrate and high in fiber.  Exercise regularly. SEEK MEDICAL CARE IF:  Your symptoms do not get better with medicine.  You have new symptoms.   This information is not intended to replace advice given to you by your health care provider. Make sure you discuss any questions you have with your health care provider.   Document Released: 11/01/2004 Document Revised: 04/28/2013 Document Reviewed: 12/24/2012 Elsevier Interactive Patient Education [employer].   Infertility Infertility is when you are unable to get pregnant (conceive) after a year of having sex regularly without using birth control. Infertility can also mean that a woman is not able to carry a pregnancy to full term.  Both women and men can have fertility problems. WHAT CAUSES INFERTILITY? What Causes Infertility in Women? There are many possible causes of infertility in women. For some women, the cause of infertility is not known (unexplained infertility). Infertility can also be linked to more than one cause. Infertility problems in women can be caused by problems with the menstrual cycle or reproductive organs, certain medical conditions, and factors related to lifestyle and age.  Problems with your menstrual cycle can interfere with your ovaries producing eggs (ovulation). This can make it difficult to get pregnant. This includes having a menstrual cycle that is very long, very short, or irregular.  Problems with reproductive organs can include:  An abnormally narrow  cervix or a cervix that does not remain closed during a pregnancy.  A blockage in your fallopian tubes.  An abnormally shaped uterus.  Uterine fibroids. This is a tissue mass (tumor) that can develop on your uterus.  Medical conditions that can affect a woman's fertility include:  Polycystic ovarian syndrome (PCOS). This is a hormonal disorder that can cause small cysts to grow on your ovaries. This is the most common cause of infertility in women.  Endometriosis. This is a condition in which the tissue that lines your uterus (endometrium) grows outside of its normal location.  Primary ovary insufficiency. This is when your ovaries stop producing eggs and hormones before the age of 50.  Sexually transmitted diseases, such as chlamydia or gonorrhea. These infections can cause scarring in your fallopian tubes. This makes it difficult for eggs to reach your uterus.  Autoimmune disorders. These are disorders in which your immune system attacks normal, healthy cells.  Hormone imbalances.  Other factors include:  Age. A woman's fertility declines with age, especially after her mid-42s.  Being under- or overweight.  Drinking too much alcohol.  Using drugs.  Exercising excessively.  Being exposed to environmental toxins, such as radiation, pesticides, and certain chemicals. What Causes Infertility in Men? There are many causes of infertility in men. Infertility can be linked to more than one cause. Infertility problems in men can be caused by problems with sperm or the reproductive organs, certain medical conditions, and factors related to lifestyle and age. Some men have unexplained infertility.   Problems with sperm. Infertility can result if there is a problem producing:  Enough sperm (low sperm count).  Enough normally-shaped sperm (sperm morphology).  Sperm that are able to reach the egg (poor motility).  Infertility can also be caused by:  A problem with  hormones.  Enlarged veins (varicoceles), cysts (spermatoceles), or tumors of the testicles.  Sexual dysfunction.  Injury to the testicles.  A birth defect, such as not having the tubes that carry sperm (vas deferens).  Medical conditions that can affect a man's fertility include:  Diabetes.  Cancer treatments, such as chemotherapy or radiation.  Klinefelter syndrome. This is an inherited genetic disorder.   Thyroid problems, such as an under- or overactive thyroid.  Cystic fibrosis.  Sexually transmitted diseases.  Other factors include:  Age. A man's fertility declines with age.  Drinking too much alcohol.  Using drugs.  Being exposed to environmental toxins, such as pesticides and lead. WHAT ARE THE SYMPTOMS OF INFERTILITY? Being unable to get pregnant after one year of having regular sex without using birth control is the only sign of infertility.  HOW IS INFERTILITY DIAGNOSED? In order to be diagnosed with infertility, both partners will have a physical exam. Both partners will also have an extensive medical and sexual history taken. If there is no obvious reason for infertility, additional tests may be done. What Tests Will Women Have? Women may first have tests to check whether they are ovulating each month. The tests may include:  Blood tests to check hormone levels.  An ultrasound of the ovaries. This looks for possible problems on or in the ovaries.  Taking a small sample of the tissue that lines the uterus for examination under a microscope (endometrial biopsy). Women who are ovulating may have additional tests. These may include:  Hysterosalpingography.  This is an X-ray of the fallopian tubes and uterus taken after a specific type of dye is injected.  This test can show the shape of the uterus and whether the fallopian tubes are open.  Laparoscopy.  In this test, a lighted tube (laparoscope) is used to look for problems in the fallopian tubes and  other female organs.  Transvaginal ultrasound.  This is an imaging test to check for abnormalities of the uterus and ovaries.  A health care provider can use this test to count the number of follicles on the ovaries.  Hysteroscopy.  This test involves using a lighted tube to examine the cervix and inside the uterus.  It is done to find any abnormalities inside the uterus. What Tests Will Men Have? Tests for men's infertility includes:  Semen tests to check sperm count, morphology, and motility.  Blood tests to check for hormone levels.  Taking a small sample of tissue from inside a testicle (biopsy). This is examined under a microscope.  Blood tests to check for genetic abnormalities (genetic testing). HOW ARE WOMEN TREATED FOR INFERTILITY?  Treatment depends on the cause of infertility. Most cases of infertility in women are treated with medicine or surgery.  Women may take medicine to:  Correct ovulation problems.  Treat other health conditions, such  as PCOS.  Surgery may be done to:  Repair damage to the ovaries, fallopian tubes, cervix, or uterus.  Remove growths from the uterus.  Remove scar tissue from the uterus, pelvis, or other female organs. HOW ARE MEN TREATED FOR INFERTILITY?  Treatment depends on the cause of infertility. Most cases of infertility in men are treated with medicine or surgery.   Men may take medicine to:  Correct hormone problems.  Treat other health conditions.  Treat sexual dysfunction.  Surgery may be done to:  Remove blockages in the reproductive tract.  Correct other structural problems of the reproductive tract. WHAT IS ASSISTED REPRODUCTIVE TECHNOLOGY? Assisted reproductive technology (ART) refers to all treatments and procedures that combine eggs and sperm outside the body to try to help a couple conceive. ART is often combined with fertility drugs to stimulate ovulation. Sometimes ART is done using eggs retrieved from  another woman's body (donor eggs) or from previously frozen fertilized eggs (embryos).  There are different types of ART. These include:   Intrauterine insemination (IUI).  In this procedure, sperm is placed directly into a woman's uterus with a long, thin tube.  This may be most effective for infertility caused by sperm problems, including low sperm count and low motility.  Can be used in combination with fertility drugs.  In vitro fertilization (IVF).  This is often done when a woman's fallopian tubes are blocked or when a man has low sperm counts.  Fertility drugs stimulate the ovaries to produce multiple eggs. Once mature, these eggs are removed from the body and combined with the sperm to be fertilized.  These fertilized eggs are then placed in the woman's uterus.   This information is not intended to replace advice given to you by your health care provider. Make sure you discuss any questions you have with your health care provider.   Document Released: 07/11/2003 Document Revised: 03/29/2015 Document Reviewed: 03/23/2014 Elsevier Interactive Patient Education 2016 [place] should follow up with the dentist if the mouth pain doesn't improve. I recommend salt water gargles and/or use of mouthwash to keep the gum clean. There is no evidence of abscess or infection. You can use ibuprofen or aleve as needed for pain. You can also try anbesol as needed for the discomfort. (topical, over-the-counter).

## 2016-05-03 ENCOUNTER — Encounter: Payer: Self-pay | Admitting: Obstetrics & Gynecology

## 2016-05-03 ENCOUNTER — Ambulatory Visit (INDEPENDENT_AMBULATORY_CARE_PROVIDER_SITE_OTHER): Payer: Medicaid Other | Admitting: Obstetrics & Gynecology

## 2016-05-03 VITALS — BP 120/74 | HR 83 | Ht <= 58 in | Wt 97.0 lb

## 2016-05-03 DIAGNOSIS — N939 Abnormal uterine and vaginal bleeding, unspecified: Secondary | ICD-10-CM

## 2016-05-03 DIAGNOSIS — O019 Hydatidiform mole, unspecified: Secondary | ICD-10-CM

## 2016-05-03 DIAGNOSIS — O010 Classical hydatidiform mole: Secondary | ICD-10-CM | POA: Diagnosis not present

## 2016-05-03 NOTE — Progress Notes (Signed)
History:  25 y.o. G1P1001 here today for f/u.  She reports AUB.  Her current cycle seems like its 'getting back to normal'.  She was prev bleeding only 2 days.  She has had 4 cycles since the birth of her child who is almost 61 years old.  She had a cycle 8/30 and then Oct 8th.  Pt did not have menses in end of Sept.   Pt reports that prior to her pregnancy she had irreg cycles with some missed periods. She conceived on Clomid and her pregnancy was a twin IUP with a vanishing twin. Her PP care wsa complicated by GTN and pt was on treatment and Depo Provera for 1 year. She is   Had her last treatment was June 20th, 2016.  Pt is 15 months diease free. [place] is normal from Sept 2017.  Pts last Depo provera was Feb 2017.    The following portions of the patient's history were reviewed and updated as appropriate: allergies, current medications, past family history, past medical history, past social history, past surgical history and problem list.  Review of Systems:  Pertinent items are noted in HPI.  Objective:  Physical Exam Blood pressure 120/74, pulse 83, height 4\' 10"  (1.473 m), weight 97 lb (44 kg), last menstrual period 04/28/2016, not currently breastfeeding. Gen: NAD Abd: Soft, nontender and nondistended Pelvic: pt declined as she is on her menses  Labs and Imaging US Transvaginal Non-ob  Result Date: 04/05/2016 CLINICAL DATA:  Abnormal periods every 3-4 months which last only 1 day. Post coli though bleeding. 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 Last normal menstrual period was March 20, 2016 EXAM: ULTRASOUND PELVIS TRANSVAGINAL TECHNIQUE: Transvaginal ultrasound examination of the pelvis was performed including evaluation of the uterus, ovaries, adnexal regions, and pelvic cul-de-sac. COMPARISON:  Pelvic ultrasound of October 30, 2015 FINDINGS: Uterus Measurements: 5.4 x 3.0 x 4.4 cm. No discrete fibroids are observed. Endometrium Thickness: 5  mm.  No focal abnormality visualized. Right ovary Measurements: 2.2 x 2.6 x 2.5 cm. There are multiple follicles of similar size. Left ovary Measurements: 3 x 1.8 x 2.2 cm. The left ovary is located posterior to the uterus. Numerous follicles are observed which exhibit fairly similar size. Other findings:  No abnormal free fluid IMPRESSION: 1. Normal size and contour of the uterus. The endometrial stripe is not abnormally thickened. If bleeding remains unresponsive to hormonal or medical therapy, sonohysterogram should be considered for focal lesion work-up. (Ref: Radiological Reasoning: Algorithmic Workup of Abnormal Vaginal Bleeding with Endovaginal Sonography and Sonohysterography. AJR 2008; [id number]) 2. Probable polycystic ovarian syndrome. No solid-appearing Ob variant masses. No adnexal masses or free pelvic fluid. Electronically Signed   By: David  Martinique M.D.   On: 04/05/2016 10:34    Assessment & Plan:  AUB.  I suspect that pt does have PCOS but ,she is also within 8 months of her last Depo Provera injection which could certainly be contributing to her irreg menses.  Pt was prescribed Metformin by her primary care provider but, she has not filled the Rx.  She is hesitant to try clomid again due to the risk of twin IUP.  Pt will wait 1 full year from the Depo provera prior to considering meds.   She will consider Letrazole at that time  Will hold off on the Metformin for now F/u in 4 months or sooner prn Keep PNV   Marti Acebo L. Harraway-Smith, M.D., [place]lynn June

## 2016-05-03 NOTE — Patient Instructions (Signed)
Polycystic Ovarian Syndrome  Polycystic ovarian syndrome (PCOS) is a common hormonal disorder among women of reproductive age. Most women with PCOS grow many small cysts on their ovaries. PCOS can cause problems with your periods and make it difficult to get pregnant. It can also cause an increased risk of miscarriage with pregnancy. If left untreated, PCOS can lead to serious health problems, such as diabetes and heart disease.  CAUSES  The cause of PCOS is not fully understood, but genetics may be a factor.  SIGNS AND SYMPTOMS   · Infrequent or no menstrual periods.    · Inability to get pregnant (infertility) because of not ovulating.    · Increased growth of hair on the face, chest, stomach, back, thumbs, thighs, or toes.    · Acne, oily skin, or dandruff.    · Pelvic pain.    · Weight gain or obesity, usually carrying extra weight around the waist.    · Type 2 diabetes.     · High cholesterol.    · High blood pressure.    · Female-pattern baldness or thinning hair.    · Patches of thickened and dark brown or black skin on the neck, arms, breasts, or thighs.    · Tiny excess flaps of skin (skin tags) in the armpits or neck area.    · Excessive snoring and having breathing stop at times while asleep (sleep apnea).    · Deepening of the voice.    · Gestational diabetes when pregnant.    DIAGNOSIS   There is no single test to diagnose PCOS.   · Your health care provider will:      Take a medical history.      Perform a pelvic exam.      Have ultrasonography done.      Check your female and female hormone levels.      Measure glucose or sugar levels in the blood.      Do other blood tests.    · If you are producing too many female hormones, your health care provider will make sure it is from PCOS. At the physical exam, your health care provider will want to evaluate the areas of increased hair growth. Try to allow natural hair growth for a few days before the visit.    · During a pelvic exam, the ovaries may be enlarged  or swollen because of the increased number of small cysts. This can be seen more easily by using vaginal ultrasonography or screening to examine the ovaries and lining of the uterus (endometrium) for cysts. The uterine lining may become thicker if you have not been having a regular period.    TREATMENT   Because there is no cure for PCOS, it needs to be managed to prevent problems. Treatments are based on your symptoms. Treatment is also based on whether you want to have a baby or whether you need contraception.   Treatment may include:   · Progesterone hormone to start a menstrual period.    · Birth control pills to make you have regular menstrual periods.    · Medicines to make you ovulate, if you want to get pregnant.    · Medicines to control your insulin.    · Medicine to control your blood pressure.    · Medicine and diet to control your high cholesterol and triglycerides in your blood.  · Medicine to reduce excessive hair growth.   · Surgery, making small holes in the ovary, to decrease the amount of female hormone production. This is done through a long, lighted tube (laparoscope) placed into the pelvis through a tiny incision in the lower abdomen.      HOME CARE INSTRUCTIONS  · Only take over-the-counter or prescription medicine as directed by your health care provider.  · Pay attention to the foods you eat and your activity levels. This can help reduce the effects of PCOS.    Keep your weight under control.    Eat foods that are low in carbohydrate and high in fiber.    Exercise regularly.  SEEK MEDICAL CARE IF:  · Your symptoms do not get better with medicine.  · You have new symptoms.     This information is not intended to replace advice given to you by your health care provider. Make sure you discuss any questions you have with your health care provider.     Document Released: 11/01/2004 Document Revised: 04/28/2013 Document Reviewed: 12/24/2012  Elsevier Interactive Patient Education ©2016 Elsevier  Inc[employer]

## 2016-07-09 ENCOUNTER — Ambulatory Visit (INDEPENDENT_AMBULATORY_CARE_PROVIDER_SITE_OTHER): Payer: Medicaid Other | Admitting: Medical

## 2016-07-09 ENCOUNTER — Encounter: Payer: Self-pay | Admitting: Medical

## 2016-07-09 VITALS — BP 106/60 | HR 100 | Temp 98.4°F | Wt 94.8 lb

## 2016-07-09 DIAGNOSIS — N926 Irregular menstruation, unspecified: Secondary | ICD-10-CM | POA: Diagnosis not present

## 2016-07-09 DIAGNOSIS — R110 Nausea: Secondary | ICD-10-CM | POA: Diagnosis not present

## 2016-07-09 DIAGNOSIS — A084 Viral intestinal infection, unspecified: Secondary | ICD-10-CM

## 2016-07-09 DIAGNOSIS — R109 Unspecified abdominal pain: Secondary | ICD-10-CM

## 2016-07-09 DIAGNOSIS — R1013 Epigastric pain: Secondary | ICD-10-CM | POA: Diagnosis not present

## 2016-07-09 LAB — POCT URINALYSIS DIPSTICK
Bilirubin, UA: NEGATIVE
Blood, UA: NEGATIVE
GLUCOSE UA: NEGATIVE
KETONES UA: NEGATIVE
LEUKOCYTES UA: NEGATIVE
Nitrite, UA: NEGATIVE
PROTEIN UA: NEGATIVE
UROBILINOGEN UA: NEGATIVE
pH, UA: 6

## 2016-07-09 LAB — POCT URINE PREGNANCY: Preg Test, Ur: NEGATIVE

## 2016-07-09 NOTE — Progress Notes (Signed)
Subjective: Chief Complaint  Patient presents with  . stomach ache.headache    stomach ache, headache, vomitting, feeling tried, x2 day    here for 1 day hx/o not feeling well.  Having dizziness, nausea, belly upset x 1 day.  Denies eating foods worrisome for food poisoning.  No recent travel, no sick contacts with similar.   Feels tired.  Vomited few times last night but not this morning.  No diarrhea.  Has some general abdominal tenderness.  Took some omeprazole yesterday.  Eats spicy foods.  No URI symptoms.  No urinary symptoms.  Has some low back pain.     She does a few week hx/o epigastric pain, acid reflux.  She declines prescription medication but has been using some Prilosec OTC.   Yesterday she wondered if the acid contributed to her symptoms of nausea and vomiting since she had eaten some spicy foods.  OB/Gyn doesn't want her to take the metformin started here in October for amenorrhea.  LMP in October.  No vagina concern or vaginal bleeding.   Past Medical History:  Diagnosis Date  . Pyelonephritis    Current Outpatient Prescriptions on File Prior to Visit  Medication Sig Dispense Refill  . prenatal vitamin w/FE, FA (PRENATAL 1 + 1) 27-1 MG TABS tablet Take 1 tablet by mouth daily at 12 noon.     Current Facility-Administered Medications on File Prior to Visit  Medication Dose Route Frequency Provider Last Rate Last Dose  . heparin lock flush 100 unit/mL  500 Units Intravenous Once Lennis P Livesay, MD      . sodium chloride 0.9 % injection 10 mL  10 mL Intravenous PRN Lennis P Livesay, MD       ROS as in subjective  Objective: BP 106/60   Pulse 100   Temp 98.4 F (36.9 C)   Wt 94 lb 12.8 oz (43 kg)   SpO2 96%   BMI 19.81 kg/m   Wt Readings from Last 3 Encounters:  07/09/16 94 lb 12.8 oz (43 kg)  05/03/16 97 lb (44 kg)  05/01/16 95 lb 12.8 oz (43.5 kg)   BP Readings from Last 3 Encounters:  07/09/16 106/60  05/03/16 120/74  05/01/16 112/80   General  appearance: alert, no distress, WD/WN  HEENT: normocephalic, sclerae anicteric, PERRLA, EOMi, nares patent, no discharge or erythema, pharynx normal Oral cavity: MMM, no lesions Neck: supple, no lymphadenopathy, no thyromegaly, no masses Heart: RRR, normal S1, S2, no murmurs Lungs: CTA bilaterally, no wheezes, rhonchi, or rales Abdomen: +bs, soft, generally tender, no specific focal tenderness, non distended, no masses, no hepatomegaly, no splenomegaly Back: non tender Musculoskeletal: nontender, no swelling, no obvious deformity Extremities: no edema, no cyanosis, no clubbing Pulses: 2+ symmetric, upper and lower extremities, normal cap refill Neurological: alert, oriented x 3, CN2-12 intact, strength normal upper extremities and lower extremities, sensation normal throughout, DTRs 2+ throughout, no cerebellar signs, gait normal Psychiatric: normal affect, behavior normal, pleasant    Assessment: Encounter Diagnoses  Name Primary?  . Viral gastroenteritis Yes  . Nausea without vomiting   . Missed periods   . Stomach pain   . Abdominal pain, epigastric     Plan: Urine pregnancy negative and UA normal.   Current symptoms suggestive of viral gastroenteritis.  She is allergy to Zofran and phenergan.   Can use prn Emetrol or Pepto OTC, use clear fluids and brat diet as tolerated the next few days.  If not improving or worse within the next 72  hours then call or return.    She is seeing gyn regarding amenorrhea and recently was taken off metformin by gyn.  F/u with gyn.   Iylee was seen today for stomach ache.headache.  Diagnoses and all orders for this visit:  Viral gastroenteritis  Nausea without vomiting  Missed periods -     POCT urine pregnancy  Stomach pain -     Urinalysis Dipstick  Abdominal pain, epigastric

## 2016-07-09 NOTE — Patient Instructions (Signed)
Currently the symptoms suggest stomach virus  Begin either Emetrol OR Pepto Bismol over the counter for nausea and upset stomach  For the next 3-4 days eat small portions, bland foods like crackers, bananas, rice, toast, applesauce, soup  Avoid spicy foods, fried foods, big portions, greasy foods, citrus and tomato for the next 3-4 days  Hydrate well with clear fluids  If worse in the next 48 hours let me know  These symptoms should resolve in the next 3 days

## 2016-08-09 ENCOUNTER — Other Ambulatory Visit: Payer: Self-pay | Admitting: Nurse Practitioner

## 2016-08-12 ENCOUNTER — Encounter (HOSPITAL_COMMUNITY): Payer: Self-pay | Admitting: Family Medicine

## 2016-08-12 ENCOUNTER — Ambulatory Visit (HOSPITAL_COMMUNITY)
Admission: EM | Admit: 2016-08-12 | Discharge: 2016-08-12 | Disposition: A | Discharge status: Home / Self Care | Payer: Medicaid Other | Attending: Family Medicine | Admitting: Family Medicine

## 2016-08-12 DIAGNOSIS — R059 Cough, unspecified: Secondary | ICD-10-CM

## 2016-08-12 DIAGNOSIS — R05 Cough: Secondary | ICD-10-CM | POA: Diagnosis not present

## 2016-08-12 DIAGNOSIS — R69 Illness, unspecified: Secondary | ICD-10-CM

## 2016-08-12 DIAGNOSIS — J111 Influenza due to unidentified influenza virus with other respiratory manifestations: Secondary | ICD-10-CM

## 2016-08-12 MED ORDER — BENZONATATE 100 MG PO CAPS: 100.0000 mg | ORAL_CAPSULE | Freq: Three times a day (TID) | ORAL | 0 refills | Status: DC

## 2016-08-12 MED ORDER — OSELTAMIVIR PHOSPHATE 75 MG PO CAPS: 75.0000 mg | ORAL_CAPSULE | Freq: Two times a day (BID) | ORAL | 0 refills | Status: DC

## 2016-08-12 NOTE — ED Triage Notes (Signed)
Pt here for URI symptoms since yesterday.  

## 2016-08-12 NOTE — ED Provider Notes (Signed)
CSN: [id number]     Arrival date & time 08/12/16  1912 History   First MD Initiated Contact with Patient 08/12/16 2001     Chief Complaint  Patient presents with  . Fever  . Cough   (Consider location/radiation/quality/duration/timing/severity/associated sxs/prior Treatment) Patient is here with c/o fever and cough for 2 days.    URI  Presenting symptoms: congestion, cough, fatigue and fever   Severity:  Moderate Onset quality:  Sudden Duration:  2 days Timing:  Constant Progression:  Worsening Chronicity:  New Relieved by:  Nothing Worsened by:  Nothing Associated symptoms: arthralgias     Past Medical History:  Diagnosis Date  . Pyelonephritis    Past Surgical History:  Procedure Laterality Date  . DILATION AND EVACUATION N/A 08/10/2014   Procedure: DILATATION AND EVACUATION;  Surgeon: Lavonia Drafts, MD;  Location: [facility];  Service: Gynecology;  Laterality: N/A;  . HYSTEROSCOPY N/A 08/10/2014   Procedure: HYSTEROSCOPY;  Surgeon: Lavonia Drafts, MD;  Location: [facility];  Service: Gynecology;  Laterality: N/A;  . NO PAST SURGERIES     Family History  Problem Relation Age of Onset  . Alcohol abuse Neg Hx    Social History  Substance Use Topics  . Smoking status: Never Smoker  . Smokeless tobacco: Never Used  . Alcohol use No   OB History    Gravida Para Term Preterm AB Living   1 1 1     1    SAB TAB Ectopic Multiple Live Births         0 1     Review of Systems  Constitutional: Positive for fatigue and fever.  HENT: Positive for congestion.   Eyes: Negative.   Respiratory: Positive for cough.   Cardiovascular: Negative.   Gastrointestinal: Negative.   Endocrine: Negative.   Genitourinary: Negative.   Musculoskeletal: Positive for arthralgias.  Allergic/Immunologic: Negative.   Neurological: Negative.   Hematological: Negative.   Psychiatric/Behavioral: Negative.     Allergies  Ginger; Okra; Ondansetron; Phenergan [promethazine]; and  Zofran [ondansetron hcl]  Home Medications   Prior to Admission medications   Medication Sig Start Date End Date Taking? Authorizing Provider  benzonatate (TESSALON) 100 MG capsule Take 1 capsule (100 mg total) by mouth every 8 (eight) hours. 08/12/16   Lysbeth Penner, FNP  oseltamivir (TAMIFLU) 75 MG capsule Take 1 capsule (75 mg total) by mouth every 12 (twelve) hours. 08/12/16   Lysbeth Penner, FNP  prenatal vitamin w/FE, FA (PRENATAL 1 + 1) 27-1 MG TABS tablet Take 1 tablet by mouth daily at 12 noon.    Historical Provider, MD   Meds Ordered and Administered this Visit  Medications - No data to display  BP 106/73   Pulse 105   Temp 99.7 F (37.6 C) Comment: took tylenol 4 hours ago  Resp 18   LMP 05/12/2016   SpO2 100%  No data found.   Physical Exam  Constitutional: She appears well-developed and well-nourished.  HENT:  Head: Normocephalic and atraumatic.  Right Ear: External ear normal.  Left Ear: External ear normal.  Mouth/Throat: Oropharynx is clear and moist.  Eyes: Conjunctivae and EOM are normal. Pupils are equal, round, and reactive to light.  Neck: Normal range of motion. Neck supple.  Cardiovascular: Normal rate, regular rhythm and normal heart sounds.   Pulmonary/Chest: Effort normal and breath sounds normal.  Abdominal: Soft.  Nursing note and vitals reviewed.   Urgent Care Course     Procedures (including critical care time)  Labs Review Labs Reviewed - No data to display  Imaging Review No results found.   Visual Acuity Review  Right Eye Distance:   Left Eye Distance:   Bilateral Distance:    Right Eye Near:   Left Eye Near:    Bilateral Near:         MDM   1. Influenza-like illness   2. Cough    Tamiflu 75mg  one po bid x 5 days #10 Tessalon Perles 200mg  po tid prn  Push po fluids, rest, tylenol and motrin otc prn as directed for fever, arthralgias, and myalgias.  Follow up prn if sx's continue or persist.    Lysbeth Penner, FNP 08/12/16 2007

## 2016-08-14 ENCOUNTER — Encounter: Payer: Self-pay | Admitting: Medical

## 2016-08-14 ENCOUNTER — Ambulatory Visit: Payer: Medicaid Other | Admitting: Family Medicine

## 2016-08-14 ENCOUNTER — Ambulatory Visit (INDEPENDENT_AMBULATORY_CARE_PROVIDER_SITE_OTHER): Payer: Medicaid Other | Admitting: Medical

## 2016-08-14 VITALS — BP 98/62 | HR 74 | Temp 98.2°F | Wt 92.3 lb

## 2016-08-14 DIAGNOSIS — I889 Nonspecific lymphadenitis, unspecified: Secondary | ICD-10-CM

## 2016-08-14 DIAGNOSIS — J111 Influenza due to unidentified influenza virus with other respiratory manifestations: Secondary | ICD-10-CM | POA: Diagnosis not present

## 2016-08-14 DIAGNOSIS — R0789 Other chest pain: Secondary | ICD-10-CM

## 2016-08-14 MED ORDER — MOMETASONE FURO-FORMOTEROL FUM 100-5 MCG/ACT IN AERO: 2.0000 | INHALATION_SPRAY | Freq: Two times a day (BID) | RESPIRATORY_TRACT | 0 refills | Status: DC

## 2016-08-14 NOTE — Addendum Note (Signed)
Addended by: Carlena Hurl on: 08/14/2016 01:49 PM   Modules accepted: Orders

## 2016-08-14 NOTE — Progress Notes (Signed)
Subjective Chief Complaint  Patient presents with  . fever, coughing, sore throat    fever, coughing, sore throat ,hurts when she turns her head    Here for influenza f/u.   Was seen at urgent care 2 days ago, diagnosed with flu.  She has now had 4 days of body aches, chills, fever, headache, cough, congestion.  Was given tamiflu on Monday, using this.   Fever has been controlled, highest 101.7, is hydrating well, resting.  What brought her in today is pain and swelling in left neck, restricting some motion and feels tight in chest like "asthma."  unfortunately her boyfriend and 103 year old daughter have the flu as well.  The whole house hold has been sick about the same time.  Nobody at home is severe fortunately.    She is using fever reducer q6 hours (tylenol).  She denies pregnancy.  No other aggravating or relieving factors. No other complaint.   Past Medical History:  Diagnosis Date  . Pyelonephritis    Current Outpatient Prescriptions on File Prior to Visit  Medication Sig Dispense Refill  . benzonatate (TESSALON) 100 MG capsule Take 1 capsule (100 mg total) by mouth every 8 (eight) hours. 21 capsule 0  . oseltamivir (TAMIFLU) 75 MG capsule Take 1 capsule (75 mg total) by mouth every 12 (twelve) hours. 10 capsule 0  . prenatal vitamin w/FE, FA (PRENATAL 1 + 1) 27-1 MG TABS tablet Take 1 tablet by mouth daily at 12 noon.     Current Facility-Administered Medications on File Prior to Visit  Medication Dose Route Frequency Provider Last Rate Last Dose  . heparin lock flush 100 unit/mL  500 Units Intravenous Once Lennis P Livesay, MD      . sodium chloride 0.9 % injection 10 mL  10 mL Intravenous PRN Lennis P Livesay, MD        Objective: BP 98/62   Pulse 74   Temp 98.2 F (36.8 C)   Wt 92 lb 4.8 oz (41.9 kg)   SpO2 99%   BMI 19.29 kg/m    General appearance: alert, no distress, WD/WN, mildly ill appearing HEENT: normocephalic, sclerae anicteric, conjunctiva pink and moist, left  TM with mild erythema, right TM pearly, nares patent, clear discharge , + erythema, pharynx normal, tonsils unremarkable Oral cavity: MMM, no lesions Neck: supple,tender mildly swollen lymph nodes in left lateral neck and preauricular area, no thyromegaly, no masses Heart: RRR, normal S1, S2, no murmurs Lungs: CTA bilaterally, no wheezes, rhonchi, or rales No meningeal signs Pulses: 2+ symmetric    Assessment: Encounter Diagnoses  Name Primary?  . Influenza Yes  . Lymphadenitis   . Chest tightness     Plan: Discussed diagnosis of influenza. Discussed supportive care including rest, hydration, OTC Tylenol or NSAID for fever, aches, and malaise.  Discussed period of contagion, self quarantine at home away from others to avoid spread of disease, discussed means of transmission, and possible complications including pneumonia.    She appears to be hydrating well, temp controlled, overall doing ok.   She has lymph nodes inflamed on exam which worried her.  I advised she switch from Tylenol to OTC Ibuprofen 2-3 tablets TID the next several days.  Finish out tamiflu.  Rest, hydrate e well.   Gave sample of Dulera to help with tightness in chest, use 2 puffs BID.  Discussed proper use of medication.    If worse or not improving within the next 4-5 days, then call or return.  No signs of meningitis, no headache, no meningeal signs on exam.  Patient voiced understanding of diagnosis, recommendations, and treatment plan.

## 2016-09-12 ENCOUNTER — Encounter: Payer: Self-pay | Admitting: Obstetrics & Gynecology

## 2016-09-12 ENCOUNTER — Ambulatory Visit (INDEPENDENT_AMBULATORY_CARE_PROVIDER_SITE_OTHER): Payer: Medicaid Other | Admitting: Obstetrics & Gynecology

## 2016-09-12 VITALS — BP 111/82 | HR 92 | Ht <= 58 in | Wt 90.8 lb

## 2016-09-12 DIAGNOSIS — N911 Secondary amenorrhea: Secondary | ICD-10-CM

## 2016-09-12 MED ORDER — MEDROXYPROGESTERONE ACETATE 10 MG PO TABS: 10.0000 mg | ORAL_TABLET | Freq: Every day | ORAL | 2 refills | Status: DC

## 2016-09-12 NOTE — Progress Notes (Signed)
History:  26 y.o. G1P1001 here today for eval of secondary amenorrhea. Pt was treated for choriocarcinoma with  Dactinomycin and Dex.  She was on Depo provera during tx.  Pt now reports no cycles since Oct of 2017. She reports that she did have some cycles during treatment. She is currently sexually active.  She denies vasomotor sx or any assoc sx. She reports that her overall health has been great.  The following portions of the patient's history were reviewed and updated as appropriate: allergies, current medications, past family history, past medical history, past social history, past surgical history and problem list.  Review of Systems:  Pertinent items are noted in HPI.   Objective:  Physical Exam Blood pressure 111/82, pulse 92, height 4\' 10"  (1.473 m), weight 90 lb 12.8 oz (41.2 kg), last menstrual period 04/26/2016. BP 111/82   Pulse 92   Ht 4\' 10"  (1.473 m)   Wt 90 lb 12.8 oz (41.2 kg)   LMP 04/26/2016 (Exact Date)   BMI 18.98 kg/m  CONSTITUTIONAL: Well-developed, well-nourished female in no acute distress.  HENT:  Normocephalic, atraumatic EYES: Conjunctivae and EOM are normal. No scleral icterus.  NECK: Normal range of motion SKIN: Skin is warm and dry. No rash noted. Not diaphoretic.No pallor. [place]: Alert and oriented to person, place, and time. Normal coordination.   Labs and Imaging No results found.  Assessment & Plan:  Secondary amenrorhea- unsure if this is related to Depo Provera use but, need to r/o ovarina failure due to the chemo.  Will check ovarian reserve and also commence a Provera challenge. Labs: [facility] and estridiol Provera 10mg  daily for 10 days  rec f/u on Mar 6th once she returns from [place] from seeing her parents and attending a wedding.  Mong Neal L. Harraway-Smith, M.D., Cherlynn June

## 2016-09-12 NOTE — Patient Instructions (Signed)
Secondary Amenorrhea Secondary amenorrhea is the stopping of menstrual flow for 3-6 months in a female who has previously had periods. There are many possible causes. Most of these causes are not serious. Usually, treating the underlying problem causing the loss of menses will return your periods to normal. What are the causes? Some common and uncommon causes of not menstruating include:  Malnutrition.  Low blood sugar (hypoglycemia).  Polycystic ovary disease.  Stress or fear.  Breastfeeding.  Hormone imbalance.  Ovarian failure.  Medicines.  Extreme obesity.  Cystic fibrosis.  Low body weight or drastic weight reduction from any cause.  Early menopause.  Removal of ovaries or uterus.  Contraceptives.  Illness.  Long-term (chronic) illnesses.  Cushing syndrome.  Thyroid problems.  Birth control pills, patches, or vaginal rings for birth control. What increases the risk? You may be at greater risk of secondary amenorrhea if:  You have a family history of this condition.  You have an eating disorder.  You do athletic training. How is this diagnosed? A diagnosis is made by your health care provider taking a medical history and doing a physical exam. This will include a pelvic exam to check for problems with your reproductive organs. Pregnancy must be ruled out. Often, numerous blood tests are done to measure different hormones in the body. Urine testing may be done. Specialized exams (ultrasound, CT scan, MRI, or hysteroscopy) may have to be done as well as measuring the body mass index (BMI). How is this treated? Treatment depends on the cause of the amenorrhea. If an eating disorder is present, this can be treated with an adequate diet and therapy. Chronic illnesses may improve with treatment of the illness. Amenorrhea may be corrected with medicines, lifestyle changes, or surgery. If the amenorrhea cannot be corrected, it is sometimes possible to create a false  menstruation with medicines. Follow these instructions at home:  Maintain a healthy diet.  Manage weight problems.  Exercise regularly but not excessively.  Get adequate sleep.  Manage stress.  Be aware of changes in your menstrual cycle. Keep a record of when your periods occur. Note the date your period starts, how long it lasts, and any problems. Contact a health care provider if: Your symptoms do not get better with treatment. This information is not intended to replace advice given to you by your health care provider. Make sure you discuss any questions you have with your health care provider. Document Released: 08/19/2006 Document Revised: 12/14/2015 Document Reviewed: 12/24/2012 Elsevier Interactive Patient Education  2017 Elsevier Inc.  

## 2016-09-12 NOTE — Progress Notes (Deleted)
History:  26 y.o. G1P1001 here today for amenorrhea off Depo Provera x 1 year.  LMP 04/28/2016  The following portions of the patient's history were reviewed and updated as appropriate: allergies, current medications, past family history, past medical history, past social history, past surgical history and problem list.  Review of Systems:  {Ros - complete:30496}   Objective:  Physical Exam Blood pressure 111/82, pulse 92, height 4\' 10"  (1.473 m), weight 90 lb 12.8 oz (41.2 kg), last menstrual period 04/26/2016. Gen: NAD Abd: Soft, nontender and nondistended Pelvic: Normal appearing external genitalia; normal appearing vaginal mucosa and cervix.  Normal discharge.  Small uterus, no other palpable masses, no uterine or adnexal tenderness  Labs and Imaging No results found.  Assessment & Plan:  Secondary amenorrhea  FSH and estridiol today Provera challenge x 7

## 2016-09-13 LAB — ESTRADIOL: Estradiol: 85.6 pg/mL

## 2016-09-13 LAB — FOLLICLE STIMULATING HORMONE: FSH: 2.5 m[IU]/mL

## 2016-09-24 ENCOUNTER — Ambulatory Visit (INDEPENDENT_AMBULATORY_CARE_PROVIDER_SITE_OTHER): Payer: Medicaid Other | Admitting: Obstetrics & Gynecology

## 2016-09-24 VITALS — BP 130/76 | HR 106 | Wt 93.9 lb

## 2016-09-24 DIAGNOSIS — N912 Amenorrhea, unspecified: Secondary | ICD-10-CM | POA: Diagnosis not present

## 2016-09-26 ENCOUNTER — Ambulatory Visit: Payer: Self-pay

## 2016-09-30 ENCOUNTER — Telehealth: Payer: Self-pay | Admitting: *Deleted

## 2016-09-30 NOTE — Telephone Encounter (Signed)
Received a voicemail from 09/27/16 am stating she is leaving a message for Dr. Ihor Dow. Hard to understand message due to accent and poor connection hard to understand - states she was supposed to take a medicine to have a period- and then start clomid. Wants a call back.

## 2016-09-30 NOTE — Telephone Encounter (Signed)
I called Shelly Koch back and left  A message I was calling back to discuss her call- please call us back.

## 2016-09-30 NOTE — Telephone Encounter (Signed)
Shelly Koch called and left a message she is returning our call. I called Shelly Koch and she states Dr.Harraway-Smith told her to call back if she got a period and then she would order clomid.   States she took the provera for 10 days and  She got a period 3-4 days later- which is still going on- on day 4 of period now. Has ultrasound scheduled for 10/02/16. I informed her I would forward message to Dr. Ihor Dow.

## 2016-10-02 ENCOUNTER — Telehealth: Payer: Self-pay | Admitting: Obstetrics & Gynecology

## 2016-10-02 ENCOUNTER — Ambulatory Visit (HOSPITAL_COMMUNITY)
Admission: RE | Admit: 2016-10-02 | Discharge: 2016-10-02 | Disposition: A | Discharge status: Home / Self Care | Payer: Medicaid Other | Source: Ambulatory Visit | Attending: Obstetrics & Gynecology | Admitting: Obstetrics & Gynecology

## 2016-10-02 DIAGNOSIS — N912 Amenorrhea, unspecified: Secondary | ICD-10-CM

## 2016-10-02 DIAGNOSIS — N911 Secondary amenorrhea: Secondary | ICD-10-CM

## 2016-10-02 MED ORDER — CLOMIPHENE CITRATE 50 MG PO TABS: 50.0000 mg | ORAL_TABLET | Freq: Every day | ORAL | 3 refills | Status: DC

## 2016-10-02 NOTE — Telephone Encounter (Signed)
TC to pt. She had a cycle after the Provera. She is now on day #6.  She prev was on Clomid to conceive. She is interested in restarting Clomid and would like to start this cycle. Reviewed Clomid use and the risks assoc with it.  She will begin at 50mg  she will begin this cycle of Clomid on day #7 instead of 5.  In April, if she has not conceived, she will begin on Day 5-9.  I have informed her that I will be away in April if she has to restart (if she does not conceive) but, she can speak to one of my partners if she has questions.  Breeze Berringer L. Harraway-Smith, M.D., Cherlynn June

## 2016-10-06 ENCOUNTER — Encounter: Payer: Self-pay | Admitting: Obstetrics & Gynecology

## 2016-10-06 NOTE — Progress Notes (Signed)
History:  26 y.o. G1P1001 here today for eval of amenorrhea.  She reports no bleeding after Provera.  No other clinical changes noted.  She reports that she took the full 10 days.   The following portions of the patient's history were reviewed and updated as appropriate: allergies, current medications, past family history, past medical history, past social history, past surgical history and problem list.  Review of Systems:  Pertinent items are noted in HPI.   Objective:  Physical Exam Blood pressure 130/76, pulse (!) 106, weight 93 lb 14.4 oz (42.6 kg). BP 130/76   Pulse (!) 106   Wt 93 lb 14.4 oz (42.6 kg)   BMI 19.63 kg/m  CONSTITUTIONAL: Well-developed, well-nourished female in no acute distress.  HENT:  Normocephalic, atraumatic EYES: Conjunctivae and EOM are normal. No scleral icterus.  NECK: Normal range of motion SKIN: Skin is warm and dry. No rash noted. Not diaphoretic.No pallor. [place]: Alert and oriented to person, place, and time. Normal coordination.    Assessment & Plan:  Pt with continued amenorrhea after >1 year off Depo Provera. Of note pt with a h/o oligomenorrhea. She required Clomid to conceive prev  Pelvic US Will discuss with REI if continued amenorrhea  Taevon Aschoff L. Harraway-Smith, M.D., FACOG   Addendum:  Pt called back and cycle dud occur.  She was started on Clomid.

## 2016-11-21 ENCOUNTER — Encounter: Payer: Self-pay | Admitting: Family Medicine

## 2016-11-21 ENCOUNTER — Ambulatory Visit (INDEPENDENT_AMBULATORY_CARE_PROVIDER_SITE_OTHER): Payer: Medicaid Other | Admitting: Family Medicine

## 2016-11-21 VITALS — BP 104/68 | HR 69 | Temp 97.9°F | Wt 92.6 lb

## 2016-11-21 DIAGNOSIS — N926 Irregular menstruation, unspecified: Secondary | ICD-10-CM

## 2016-11-21 DIAGNOSIS — R509 Fever, unspecified: Secondary | ICD-10-CM

## 2016-11-21 DIAGNOSIS — N644 Mastodynia: Secondary | ICD-10-CM

## 2016-11-21 LAB — POCT URINE PREGNANCY: Preg Test, Ur: NEGATIVE

## 2016-11-21 NOTE — Progress Notes (Signed)
Chief Complaint  Patient presents with  . not feeling well    both breast nibbles are hard and tender and sore, body feels hot, been going on a week and half    Over a week ago she started feeling uncomfortable wearing a bra, sore on the left. Both nipples feel sore.  Yesterday the nipples felt much worse--tender to touch. No redness, swelling.  Sometimes there is some moisture/drainage (clearish yellow, not milky or bloody).  L>R soreness. She feels some tender areas within the left upper breast.  2 days ago she had fever 101.8.  She has been taking tylenol since then (for discomfort, mainly).  Last dose was about 2 hours ago.  Denies headache, cough, sore throat. +runny nose, clear. She denies any h/o allergies in the past.   Last cycle was after using Provera in February.  Last Depo Provera injection was 05/2015.  Has only had 3-4 natural cycles since then.  Used the Provera just one, and resulted in her last period 2 months ago.   She has 2.28 yo (that was part of a twin pregnancy, the result of clomid). She doesn't want to use clomid again, if possible.   PMH, PSH, SH reviewed  Meds:  None--not taking the clomid Allergies  Allergen Reactions  . Ginger Hives and Itching    Pt states "not allergic"  . Okra     Hives and itching  . Ondansetron Hives and Itching  . Phenergan [Promethazine] Hives and Itching    Note: pt tolerates PO COMPAZINE  . Zofran [Ondansetron Hcl] Rash   ROS: fever as per HPI.  No headaches, dizziness, cough, ear pain, chest pain, shortness of breath. No nausea, vomiting, diarrhea, abdominal pain, urinary complaints. Denies vaginal discharge. No rash, bleeding, bruising.    PHYSICAL EXAM:  BP 104/68   Pulse 69   Temp 97.9 F (36.6 C) (Oral)   Wt 92 lb 9.6 oz (42 kg)   LMP 09/26/2016   BMI 19.35 kg/m   Well appearing, pleasant female with occasional sniffling.  She appears well, in no distress or discomfort HEENT: PERRL, EOMI, conjunctiva and  sclera are clear. TM's and EAC's normal.  Nasal mucosa mildly edematous, not pale, no drainage. Sinuses nontender. Op is clear Neck: no lymphadenopathy, thyromegaly or mass Heart: regular rate and rhythm Lungs: clear bilaterally Back: no CVA tenderness Abdomen: soft, nontender, no mass Breasts: Nipples appear normal--no erythema, crusting, warmth.  Minimally tender. No drainage.  Breasts appear normal--no erythema, warmth, swelling. She has tender fibroglandular changes to the breasts bilaterally, L>R, mainly in upper outer quadrant.  There is no axillary lymphadenopathy. Skin: normal turgor, no rash Extremities: no edema Psych: normal mood, affect, hygiene and grooming Neuro: alert and oriented, cranial nerves intact, normal gait  ASSESSMENT/PLAN:  Breast pain - no discrete mass, no evidence of infection; suspect fibrocystic/fibroglandular and hormonal changes contributing to pain. supportive measures reviewed  Fever, unspecified fever cause  Missed period - Plan: POCT urine pregnancy    Breast pain is consistent with fibrocystic/fibroglandular breasts.  She is not pregnant, perhaps she will cycle soon. No evidence of infection or abscess, no mass. Unclear how this relates to fever.  She has had some congestion, and possibly also having myalgias. Continue supportive measures, and return for re-evaluation for persistent fever, new symptoms. If breast pain persists/worsens, consider ultrasound.

## 2016-11-21 NOTE — Patient Instructions (Signed)
  I suspect that the fever may be related to a virus (also causing the nasal congestion, body aches). I don't see anything within the breast to suggest infection or cause of the fever. Your breasts have diffuse areas of small lumpy tender areas which are consistent with fibrocystic/fibroglandular breasts. Avoiding caffeine, and sometimes vitamin E can help with breast pain. I suspect some of this is hormonal--you are not pregnant.  Perhaps you will be getting your period soon (and the discomfort should then ease off).   Breast Tenderness Breast tenderness is a common problem for women of all ages. Breast tenderness may cause mild discomfort to severe pain. The pain usually comes and goes in association with your menstrual cycle, but it can be constant. Breast tenderness has many possible causes, including hormone changes and some medicines. Your health care provider may order tests, such as a mammogram or an ultrasound, to check for any unusual findings. Having breast tenderness usually does not mean that you have breast cancer. Follow these instructions at home: Sometimes, reassurance that you do not have breast cancer is all that is needed. In general, follow these home care instructions: Managing pain and discomfort   If directed, apply ice to the area:  Put ice in a plastic bag.  Place a towel between your skin and the bag.  Leave the ice on for 20 minutes, 2-3 times a day.  Make sure you are wearing a supportive bra, especially during exercise. You may also want to wear a supportive bra while sleeping if your breasts are very tender. Medicines   Take over-the-counter and prescription medicines only as told by your health care provider. If the cause of your pain is infection, you may be prescribed an antibiotic medicine.  If you were prescribed an antibiotic, take it as told by your health care provider. Do not stop taking the antibiotic even if you start to feel better. General  instructions   Your health care provider may recommend that you reduce the amount of fat in your diet. You can do this by:  Limiting fried foods.  Cooking foods using methods, such as baking, boiling, grilling, and broiling.  Decrease the amount of caffeine in your diet. You can do this by drinking more water and choosing caffeine-free options.  Keep a log of the days and times when your breasts are most tender.  Ask your health care provider how to do breast exams at home. This will help you notice if you have an unusual growth or lump. Contact a health care provider if:  Any part of your breast is hard, red, and hot to the touch. This may be a sign of infection.  You are not breastfeeding and you have fluid, especially blood or pus, coming out of your nipples.  You have a fever.  You have a new or painful lump in your breast that remains after your menstrual period ends.  Your pain does not improve or it gets worse.  Your pain is interfering with your daily activities. This information is not intended to replace advice given to you by your health care provider. Make sure you discuss any questions you have with your health care provider. Document Released: 06/20/2008 Document Revised: 04/05/2016 Document Reviewed: 04/05/2016 Elsevier Interactive Patient Education  2017 [employer].

## 2016-12-31 ENCOUNTER — Ambulatory Visit (INDEPENDENT_AMBULATORY_CARE_PROVIDER_SITE_OTHER): Payer: Medicaid Other | Admitting: Family Medicine

## 2016-12-31 ENCOUNTER — Encounter: Payer: Self-pay | Admitting: Family Medicine

## 2016-12-31 VITALS — BP 108/66 | Temp 98.5°F | Wt 94.6 lb

## 2016-12-31 DIAGNOSIS — J029 Acute pharyngitis, unspecified: Secondary | ICD-10-CM | POA: Diagnosis not present

## 2016-12-31 DIAGNOSIS — K1120 Sialoadenitis, unspecified: Secondary | ICD-10-CM

## 2016-12-31 LAB — POCT RAPID STREP A (OFFICE): RAPID STREP A SCREEN: NEGATIVE

## 2016-12-31 NOTE — Patient Instructions (Addendum)
Try lemon drops. Buy these at your pharmacy or grocery store.  If your jaws get red, hot and more painful over the next 2-3 days then you will need to be seen again. This may mean that you have an infection and treatment would be an antibiotic.      Salivary Stone A salivary stone is a mineral deposit that builds up in the ducts that drain your salivary glands. Most salivary gland stones are made of calcium. When a stone forms, saliva can back up into the gland and cause painful swelling. Your salivary glands are the glands that produce spit (saliva). You have six major salivary glands. Each gland has a duct that carries saliva into your mouth. Saliva keeps your mouth moist and breaks down the food that you eat. It also helps to prevent tooth decay. Two salivary glands are located just in front of your ears (parotid). The ducts for these glands open up inside your cheeks, near your back teeth. You also have two glands under your tongue (sublingual) and two glands under your jaw (submandibular). The ducts for these glands open under your tongue. A stone can form in any salivary gland. The most common place for a salivary stone to develop is in a submandibular salivary gland. What are the causes? Any condition that reduces the flow of saliva may lead to stone formation. It is not known why some people form stones and others do not. What increases the risk? You may be more likely to develop a salivary stone if you:  Are female.  Do not drink enough water.  Smoke.  Have high blood pressure.  Have gout.  Have diabetes.  What are the signs or symptoms? The main sign of a salivary gland stone is sudden swelling of a salivary gland when eating. This usually happens under the jaw on one side. Other signs and symptoms include:  Swelling of the cheek or under the tongue when eating.  Pain in the swollen area.  Trouble chewing or swallowing.  Swelling that goes down after eating.  How is this  diagnosed? Your health care provider may diagnose a salivary gland stone based on your signs and symptoms. The health care provider will also do a physical exam. In many cases, a stone can be felt in a duct inside your mouth. You may need to see an ear, nose, and throat specialist (ENT or otolaryngologist) for diagnosis and treatment. You may also need to have diagnostic tests. These may include imaging studies to check for a stone, such as:  X-rays.  Ultrasound.  CT scan.  MRI.  How is this treated? Home care may be enough to treat a small stone that is not causing symptoms. Treatment of a stone that is large enough to cause symptoms may include:  Probing and widening the duct to allow the stone to pass.  Inserting a thin, flexible scope (endoscope) into the duct to locate and remove the stone.  Breaking up the stone with sound waves.  Removing the entire salivary gland.  Follow these instructions at home:  Drink enough fluid to keep your urine clear or pale yellow.  Follow these instructions every few hours: ? Suck on a lemon candy to stimulate the flow of saliva. ? Put a hot compress over the gland. ? Gently massage the gland.  Do not use any tobacco products, including cigarettes, chewing tobacco, or electronic cigarettes. If you need help quitting, ask your health care provider. Contact a health care provider  if:  You have pain and swelling in your face, jaw, or mouth after eating.  You have persistent swelling in any of these places: ? In front of your ear. ? Under your jaw. ? Inside your mouth. Get help right away if:  You have pain and swelling in your face, jaw, or mouth that are getting worse.  Your pain and swelling make it hard to swallow or breathe. This information is not intended to replace advice given to you by your health care provider. Make sure you discuss any questions you have with your health care provider. Document Released: 08/15/2004 Document  Revised: 12/14/2015 Document Reviewed: 12/08/2013 Elsevier Interactive Patient Education  2018 [employer].

## 2016-12-31 NOTE — Progress Notes (Signed)
Subjective: Chief Complaint  Patient presents with  . Sore Throat    sore throat.ear ache and pain in ear,swelling in throat     Shelly Koch is a 26 y.o. female who presents for a 4 day history of sore throat and neck tenderness. States she has swelling on both sides of her neck, worse on the right than left and the swelling and pain worsens after eating and Is better in the morning.   Denies fever, chills, ear pain, headache, rhinorrhea, nasal congestion, cough, abdominal pain, N/V/D.   Reports history of similar symptoms approximately 10 years ago when she lived in [place].   Treatment to date: Tylenol and salt water gargles.  Denies sick contacts.  No other aggravating or relieving factors.  No other c/o.  ROS as in subjective.  LMP: 1 month ago. No birth control. She is attempting to conceive.    Objective: Vitals:   12/31/16 1045  BP: 108/66  Temp: 98.5 F (36.9 C)    General appearance: Alert, WD/WN, no distress, mildly ill appearing                             Skin: warm, no rash                           Head: no sinus tenderness                            Eyes: conjunctiva normal, corneas clear, PERRLA                            Ears: pearly TMs, external ear canals normal                          Nose: septum midline, turbinates swollen, with erythema and clear discharge             Mouth/throat: MMM, tongue normal, mild pharyngeal erythema without edema or exudate.                            Neck: supple, no adenopathy, no thyromegaly, mild edema over right parotid gland, tenderness over parotid glands bilaterally, R>L.                           Heart: RRR, normal S1, S2, no murmurs                         Lungs: CTA bilaterally, no wheezes, rales, or rhonchi      Assessment: Sialoadenitis  Sore throat - Plan: Rapid Strep A   Plan: Discussed diagnosis and treatment of salivary stones. Negative rapid strep.  Suggested symptomatic OTC remedies. She will try  lemon drops.  Tylenol for fever and pain.  Call/return in 2-3 days if symptoms aren't resolving or if she notices any signs of infection.

## 2017-01-20 LAB — LAB REPORT - SCANNED: Preg Test, Ur: POSITIVE

## 2017-01-28 ENCOUNTER — Encounter (HOSPITAL_COMMUNITY): Payer: Self-pay | Admitting: *Deleted

## 2017-01-28 ENCOUNTER — Inpatient Hospital Stay (HOSPITAL_COMMUNITY): Payer: Medicaid Other

## 2017-01-28 ENCOUNTER — Inpatient Hospital Stay (HOSPITAL_COMMUNITY)
Admission: AD | Admit: 2017-01-28 | Discharge: 2017-01-28 | Disposition: A | Discharge status: Home / Self Care | Payer: Medicaid Other | Source: Ambulatory Visit | Attending: Family Medicine | Admitting: Family Medicine

## 2017-01-28 DIAGNOSIS — Z3A01 Less than 8 weeks gestation of pregnancy: Secondary | ICD-10-CM | POA: Diagnosis not present

## 2017-01-28 DIAGNOSIS — O219 Vomiting of pregnancy, unspecified: Secondary | ICD-10-CM | POA: Diagnosis not present

## 2017-01-28 DIAGNOSIS — Z3A08 8 weeks gestation of pregnancy: Secondary | ICD-10-CM

## 2017-01-28 DIAGNOSIS — Z3491 Encounter for supervision of normal pregnancy, unspecified, first trimester: Secondary | ICD-10-CM

## 2017-01-28 DIAGNOSIS — O2301 Infections of kidney in pregnancy, first trimester: Secondary | ICD-10-CM | POA: Diagnosis not present

## 2017-01-28 DIAGNOSIS — R1030 Lower abdominal pain, unspecified: Secondary | ICD-10-CM | POA: Insufficient documentation

## 2017-01-28 DIAGNOSIS — O26899 Other specified pregnancy related conditions, unspecified trimester: Secondary | ICD-10-CM

## 2017-01-28 DIAGNOSIS — O26891 Other specified pregnancy related conditions, first trimester: Secondary | ICD-10-CM

## 2017-01-28 DIAGNOSIS — R110 Nausea: Secondary | ICD-10-CM | POA: Diagnosis present

## 2017-01-28 DIAGNOSIS — R109 Unspecified abdominal pain: Secondary | ICD-10-CM | POA: Diagnosis not present

## 2017-01-28 LAB — CBC
HEMATOCRIT: 36.2 % (ref 36.0–46.0)
HEMOGLOBIN: 11.9 g/dL — AB (ref 12.0–15.0)
MCH: 31.4 pg (ref 26.0–34.0)
MCHC: 32.9 g/dL (ref 30.0–36.0)
MCV: 95.5 fL (ref 78.0–100.0)
Platelets: 172 10*3/uL (ref 150–400)
RBC: 3.79 MIL/uL — ABNORMAL LOW (ref 3.87–5.11)
RDW: 12.6 % (ref 11.5–15.5)
WBC: 10.6 10*3/uL — ABNORMAL HIGH (ref 4.0–10.5)

## 2017-01-28 LAB — URINALYSIS, ROUTINE W REFLEX MICROSCOPIC
Bilirubin Urine: NEGATIVE
GLUCOSE, UA: NEGATIVE mg/dL
HGB URINE DIPSTICK: NEGATIVE
Ketones, ur: NEGATIVE mg/dL
Leukocytes, UA: NEGATIVE
Nitrite: NEGATIVE
PH: 6 (ref 5.0–8.0)
Protein, ur: NEGATIVE mg/dL
SPECIFIC GRAVITY, URINE: 1.006 (ref 1.005–1.030)

## 2017-01-28 LAB — HCG, QUANTITATIVE, PREGNANCY: hCG, Beta Chain, Quant, S: 89461 m[IU]/mL — ABNORMAL HIGH (ref <5)

## 2017-01-28 LAB — POCT PREGNANCY, URINE: PREG TEST UR: POSITIVE — AB

## 2017-01-28 MED ORDER — METOCLOPRAMIDE HCL 10 MG PO TABS: 10.0000 mg | ORAL_TABLET | Freq: Four times a day (QID) | ORAL | 0 refills | Status: DC

## 2017-01-28 NOTE — MAU Note (Signed)
Having abd pain for like 3 days.  Having nausea. Not throwing up, just feels sick

## 2017-01-28 NOTE — Progress Notes (Signed)
Esignature pad not working, pt signed paper copy, put in paper chart.

## 2017-01-28 NOTE — Discharge Instructions (Signed)
Abdominal Pain During Pregnancy °Abdominal pain is common in pregnancy. Most of the time, it does not cause harm. There are many causes of abdominal pain. Some causes are more serious than others and sometimes the cause is not known. Abdominal pain can be a sign that something is very wrong with the pregnancy or the pain may have nothing to do with the pregnancy. Always tell your health care provider if you have any abdominal pain. °Follow these instructions at home: °· Do not have sex or put anything in your vagina until your symptoms go away completely. °· Watch your abdominal pain for any changes. °· Get plenty of rest until your pain improves. °· Drink enough fluid to keep your urine clear or pale yellow. °· Take over-the-counter or prescription medicines only as told by your health care provider. °· Keep all follow-up visits as told by your health care provider. This is important. °Contact a health care provider if: °· You have a fever. °· Your pain gets worse or you have cramping. °· Your pain continues after resting. °Get help right away if: °· You are bleeding, leaking fluid, or passing tissue from the vagina. °· You have vomiting or diarrhea that does not go away. °· You have painful or bloody urination. °· You notice a decrease in your baby's movements. °· You feel very weak or faint. °· You have shortness of breath. °· You develop a severe headache with abdominal pain. °· You have abnormal vaginal discharge with abdominal pain. °This information is not intended to replace advice given to you by your health care provider. Make sure you discuss any questions you have with your health care provider. °Document Released: 07/08/2005 Document Revised: 04/18/2016 Document Reviewed: 02/04/2013 °Elsevier Interactive Patient Education © 2018 [employer] Inc[employer] ° °Morning Sickness °Morning sickness is when you feel sick to your stomach (nauseous) during pregnancy. This nauseous feeling may or may not come with vomiting.  It often occurs in the morning but can be a problem any time of day. Morning sickness is most common during the first trimester, but it may continue throughout pregnancy. While morning sickness is unpleasant, it is usually harmless unless you develop severe and continual vomiting (hyperemesis gravidarum). This condition requires more intense treatment. °What are the causes? °The cause of morning sickness is not completely known but seems to be related to normal hormonal changes that occur in pregnancy. °What increases the risk? °You are at greater risk if you: °· Experienced nausea or vomiting before your pregnancy. °· Had morning sickness during a previous pregnancy. °· Are pregnant with more than one baby, such as twins. ° °How is this treated? °Do not use any medicines (prescription, over-the-counter, or herbal) for morning sickness without first talking to your health care provider. Your health care provider may prescribe or recommend: °· Vitamin B6 supplements. °· Anti-nausea medicines. °· The herbal medicine ginger. ° °Follow these instructions at home: °· Only take over-the-counter or prescription medicines as directed by your health care provider. °· Taking multivitamins before getting pregnant can prevent or decrease the severity of morning sickness in most women. °· Eat a piece of dry toast or unsalted crackers before getting out of bed in the morning. °· Eat five or six small meals a day. °· Eat dry and bland foods (rice, baked potato). Foods high in carbohydrates are often helpful. °· Do not drink liquids with your meals. Drink liquids between meals. °· Avoid greasy, fatty, and spicy foods. °· Get someone to cook for   you if the smell of any food causes nausea and vomiting. °· If you feel nauseous after taking prenatal vitamins, take the vitamins at night or with a snack. °· Snack on protein foods (nuts, yogurt, cheese) between meals if you are hungry. °· Eat unsweetened gelatins for desserts. °· Wearing  an acupressure wristband (worn for sea sickness) may be helpful. °· Acupuncture may be helpful. °· Do not smoke. °· Get a humidifier to keep the air in your house free of odors. °· Get plenty of fresh air. °Contact a health care provider if: °· Your home remedies are not working, and you need medicine. °· You feel dizzy or lightheaded. °· You are losing weight. °Get help right away if: °· You have persistent and uncontrolled nausea and vomiting. °· You pass out (faint). °This information is not intended to replace advice given to you by your health care provider. Make sure you discuss any questions you have with your health care provider. °Document Released: 08/29/2006 Document Revised: 12/14/2015 Document Reviewed: 12/23/2012 °Elsevier Interactive Patient Education © 2017 Elsevier Inc. ° °

## 2017-01-28 NOTE — MAU Provider Note (Signed)
History     CSN: [id number]  Arrival date and time: 01/28/17 1459  First Provider Initiated Contact with Patient 01/28/17 1706      Chief Complaint  Patient presents with  . Abdominal Pain  . Nausea   HPI Shelly Koch is a 26 y.o. G2P1001 at [redacted]w[redacted]d by LMP who presents with abdominal pain & nausea. Reports intermittent lower abdominal pain x 3 days that is worse in RLQ but alternated to LLQ this morning. Rates pain 6/10. Has not treated. Nothing makes pain better or worse. Denies vaginal bleeding, dysuria, vaginal discharge. Endorses nausea but has not been vomiting. Last BM was this morning.   OB History    Gravida Para Term Preterm AB Living   2 1 1     1    SAB TAB Ectopic Multiple Live Births         0 1      Past Medical History:  Diagnosis Date  . Pyelonephritis     Past Surgical History:  Procedure Laterality Date  . DILATION AND EVACUATION N/A 08/10/2014   Procedure: DILATATION AND EVACUATION;  Surgeon: Lavonia Drafts, MD;  Location: [facility];  Service: Gynecology;  Laterality: N/A;  . HYSTEROSCOPY N/A 08/10/2014   Procedure: HYSTEROSCOPY;  Surgeon: Lavonia Drafts, MD;  Location: [facility];  Service: Gynecology;  Laterality: N/A;  . NO PAST SURGERIES      Family History  Problem Relation Age of Onset  . Alcohol abuse Neg Hx     Social History  Substance Use Topics  . Smoking status: Never Smoker  . Smokeless tobacco: Never Used  . Alcohol use No    Allergies:  Allergies  Allergen Reactions  . Ginger Hives and Itching    Pt states "not allergic"  . [place]     Hives and itching  . Ondansetron Hives and Itching  . Phenergan [Promethazine] Hives and Itching    Note: pt tolerates PO COMPAZINE  . Zofran [Ondansetron Hcl] Rash    No prescriptions prior to admission.    Review of Systems  Constitutional: Negative.   Gastrointestinal: Positive for abdominal pain and nausea. Negative for constipation, diarrhea and vomiting.  Genitourinary:  Negative.    Physical Exam   Blood pressure 117/74, pulse 87, temperature 97.8 F (36.6 C), temperature source Oral, resp. rate 16, height 4' 9.75" (1.467 m), weight 93 lb 4 oz (42.3 kg), last menstrual period 11/30/2016, SpO2 100 %.  Physical Exam  Nursing note and vitals reviewed. Constitutional: She is oriented to person, place, and time. She appears well-developed and well-nourished. No distress.  HENT:  Head: Normocephalic and atraumatic.  Eyes: Conjunctivae are normal. Right eye exhibits no discharge. Left eye exhibits no discharge. No scleral icterus.  Neck: Normal range of motion.  Cardiovascular: Normal rate, regular rhythm and normal heart sounds.   No murmur heard. Respiratory: Effort normal and breath sounds normal. No respiratory distress. She has no wheezes.  GI: Soft. Bowel sounds are normal. She exhibits no distension. There is no tenderness. There is no rebound and no guarding.  Neurological: She is alert and oriented to person, place, and time.  Skin: Skin is warm and dry. She is not diaphoretic.  Psychiatric: She has a normal mood and affect. Her behavior is normal. Judgment and thought content normal.    MAU Course  Procedures Results for orders placed or performed during the hospital encounter of 01/28/17 (from the past 24 hour(s))  Urinalysis, Routine w reflex microscopic (not at [facility])  Status: Abnormal   Collection Time: 01/28/17  3:08 PM  Result Value Ref Range   Color, Urine STRAW (A) YELLOW   APPearance CLEAR CLEAR   Specific Gravity, Urine 1.006 1.005 - 1.030   pH 6.0 5.0 - 8.0   Glucose, UA NEGATIVE NEGATIVE mg/dL   Hgb urine dipstick NEGATIVE NEGATIVE   Bilirubin Urine NEGATIVE NEGATIVE   Ketones, ur NEGATIVE NEGATIVE mg/dL   Protein, ur NEGATIVE NEGATIVE mg/dL   Nitrite NEGATIVE NEGATIVE   Leukocytes, UA NEGATIVE NEGATIVE  Pregnancy, urine POC     Status: Abnormal   Collection Time: 01/28/17  4:01 PM  Result Value Ref Range   Preg Test, Ur  POSITIVE (A) NEGATIVE  CBC     Status: Abnormal   Collection Time: 01/28/17  5:05 PM  Result Value Ref Range   WBC 10.6 (H) 4.0 - 10.5 K/uL   RBC 3.79 (L) 3.87 - 5.11 MIL/uL   Hemoglobin 11.9 (L) 12.0 - 15.0 g/dL   HCT 36.2 36.0 - 46.0 %   MCV 95.5 78.0 - 100.0 fL   MCH 31.4 26.0 - 34.0 pg   MCHC 32.9 30.0 - 36.0 g/dL   RDW 12.6 11.5 - 15.5 %   Platelets 172 150 - 400 K/uL  hCG, quantitative, pregnancy     Status: Abnormal   Collection Time: 01/28/17  5:05 PM  Result Value Ref Range   hCG, Beta Chain, Quant, S 89,461 (H) <5 mIU/mL   US Ob Comp Less 14 Wks  Result Date: 01/28/2017 CLINICAL DATA:  Pain in early pregnancy.  Pain for 3 days. EXAM: OBSTETRIC <14 WK [place] AND TRANSVAGINAL OB US TECHNIQUE: Both transabdominal and transvaginal ultrasound examinations were performed for complete evaluation of the gestation as well as the maternal uterus, adnexal regions, and pelvic cul-de-sac. Transvaginal technique was performed to assess early pregnancy. COMPARISON:  None. FINDINGS: Intrauterine gestational sac: Single Yolk sac:  Present Embryo:  Present Cardiac Activity: Present Heart Rate: 138  bpm MSD:   mm    w     d CRL:  2  mm   5 w   5 d                  [place] EDC: 09/23/2017 Subchorionic hemorrhage: None visualized. Questionable small chorionic bump. Maternal uterus/adnexae: Maternal right ovary is unremarkable, containing a probable small corpus luteum. No mass or free fluid in the adjacent right adnexal region. Maternal left ovary is not seen but there is no mass or free fluid identified in the left adnexal region. No significant free fluid in the cul-de-sac. IMPRESSION: 1. Single live intrauterine pregnancy with estimated gestational age of [redacted] weeks and 5 days. No complicating features. 2. Questionable small chorionic bump (representing a small hematoma bulging into the gestational sac or failed second pregnancy in the process of resorption). Consider follow-up obstetric ultrasound at some point to  exclude early developing subchorionic hemorrhage. 3. Probable small corpus luteum within the right ovary. No mass or free fluid seen within either adnexal region. Electronically Signed   By: Franki Cabot M.D.   On: 01/28/2017 18:07   US Ob Transvaginal  Result Date: 01/28/2017 CLINICAL DATA:  Pain in early pregnancy.  Pain for 3 days. EXAM: OBSTETRIC <14 WK [place] AND TRANSVAGINAL OB US TECHNIQUE: Both transabdominal and transvaginal ultrasound examinations were performed for complete evaluation of the gestation as well as the maternal uterus, adnexal regions, and pelvic cul-de-sac. Transvaginal technique was performed to assess early pregnancy. COMPARISON:  None. FINDINGS: Intrauterine gestational sac: Single Yolk sac:  Present Embryo:  Present Cardiac Activity: Present Heart Rate: 138  bpm MSD:   mm    w     d CRL:  2  mm   5 w   5 d                  [place] EDC: 09/23/2017 Subchorionic hemorrhage: None visualized. Questionable small chorionic bump. Maternal uterus/adnexae: Maternal right ovary is unremarkable, containing a probable small corpus luteum. No mass or free fluid in the adjacent right adnexal region. Maternal left ovary is not seen but there is no mass or free fluid identified in the left adnexal region. No significant free fluid in the cul-de-sac. IMPRESSION: 1. Single live intrauterine pregnancy with estimated gestational age of [redacted] weeks and 5 days. No complicating features. 2. Questionable small chorionic bump (representing a small hematoma bulging into the gestational sac or failed second pregnancy in the process of resorption). Consider follow-up obstetric ultrasound at some point to exclude early developing subchorionic hemorrhage. 3. Probable small corpus luteum within the right ovary. No mass or free fluid seen within either adnexal region. Electronically Signed   By: Franki Cabot M.D.   On: 01/28/2017 18:07     MDM +UPT UA, wet prep, GC/chlamydia, CBC, ABO/Rh, quant hCG, HIV, and [place] today to  rule out ectopic pregnancy AB positive Ultrasound shows SIUP with cardiac activity  Assessment and Plan  A; 1. Normal IUP (intrauterine pregnancy) on prenatal ultrasound, first trimester   2. Abdominal pain during pregnancy   3. Nausea and vomiting during pregnancy prior to [redacted] weeks gestation    P: P: Discharge home Rx reglan Discussed reasons to return to MAU Keep follow up appointment with OB/PCP   Jorje Guild 01/28/2017, 5:06 PM

## 2017-01-29 ENCOUNTER — Encounter: Payer: Self-pay | Admitting: *Deleted

## 2017-02-06 ENCOUNTER — Other Ambulatory Visit: Payer: Medicaid Other

## 2017-02-06 ENCOUNTER — Ambulatory Visit: Payer: Medicaid Other | Admitting: Hematology and Oncology

## 2017-02-07 ENCOUNTER — Ambulatory Visit (HOSPITAL_BASED_OUTPATIENT_CLINIC_OR_DEPARTMENT_OTHER): Payer: Medicaid Other | Admitting: Hematology and Oncology

## 2017-02-07 ENCOUNTER — Other Ambulatory Visit: Payer: Medicaid Other

## 2017-02-07 DIAGNOSIS — O010 Classical hydatidiform mole: Secondary | ICD-10-CM | POA: Diagnosis not present

## 2017-02-07 DIAGNOSIS — O019 Hydatidiform mole, unspecified: Secondary | ICD-10-CM

## 2017-02-09 ENCOUNTER — Encounter: Payer: Self-pay | Admitting: Hematology and Oncology

## 2017-02-09 NOTE — Progress Notes (Signed)
[facility] progress notes  Patient Care Team: Denita Lung, MD as PCP - General (Family Medicine)  CHIEF COMPLAINTS/PURPOSE OF VISIT:  History of gestational trophoblastic disease status post chemotherapy, currently pregnant  HISTORY OF PRESENTING ILLNESS:  Shelly Koch 26 y.o. female was transferred to my care after her prior physician has left.  I reviewed the patient's records extensive and collaborated the history with the patient. Summary of her history is as follows: 1) She had a full-term female infant on 06/05/2014. This pregnancy was complicated by a twin pregnancy with loss of the second twin at a proximally [redacted] weeks gestational age. She also had gesstational hypertension during this pregnancy. Her delivery was vaginal and per patient was uncomplicated with no retained placenta in the third stage of labor. 2) In December 2015 when she was one month postpartum she was experiencing abdominal pain and was seen in the emergency room for this. HCG levels were drawn at this time and was elevated.  3) Serial tumor marker: HCG 07/04/14: 389 HCG 07/26/14: 1374 HCG 07/30/14: 1922  4) To follow-up these rises in hCG she also underwent ultrasound scan. Ultrasound 08/02/14:  No intrauterine gestational sac identified. A focal hyperechoic area is seen within the central endometrium which measures 1.1 x 0.5 x 0.8 cm. This has decreased since prior study, but is suspicious for a persistent small focus of retained products of conception. No myometrial masses are identified. The right ovary is normal in appearance. A complex cystic lesion is again seen in the left ovary which contains a fine reticular pattern of internal echoes, without blood flow on color Doppler ultrasound. This measures 3.3 x 2.5 x 2.7 cm, and has findings consistent with a benign hemorrhagic cyst.  It was felt that the elevated hCG and ultrasound findings were representative retained products of  conception. Therefore she underwent a D&C hysteroscopy 08/10/14: 6 week size uterus with small amount of tissue in cavity. 5) Postprocedure hCG levels were drawn and were surprisingly double what they had been 1 month prior. HCG 08/25/14: 6440 6) To follow-up these doubling hCG levels she underwent CT evaluation of the chest abdomen and pelvis to look for metastatic foci of trophoblastic tissue. CT chest/abdo/pelvis 09/09/14 : A few tiny sub-cm pulmonary nodules are seen scattered in the left upper and lower lobes, most likely postinflammatory in etiology in patient of this age. Normal/unremarkable uterus tubes and ovaries. No evidence for metastatic disease. 7) The patient was referred to Dr. Marko Plume for chemotherapy after she experiences plateauing in the fourth for hCGs. 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 She was treated initially with single agent methotrexate, weekly (first cycle 09/20/14). In April 2016 after receiving her seventh dose of methotrexate her hCG levels plateaued at 2693 on 11/11/14. This was an elevation from 2004.7 the week previously on 10/31/2014.  8) She underwent restaging CT of the chest abdomen and pelvis on 11/10/2014 which showed no progressive or metastatic disease. Her chemotherapy regimen was changed to actinomycin D beginning on 11/14/2014 with hCG levels at 3275 on cycle 1 of active. She received a total of 5 cycles of active with the fifth being administered on 01/09/2015 after she had experienced 3 normal (less than 2) hCG levels prior to this. On 01/16/2015 1 week after her final cycle of active her hCG continued to be normal at less than 2 representing the fifth value in this range. 9) HCG's have been checked monthly  since completing chemotherapy in June, 2016. These have all been <2 (undetectable). The last value was drawn on 03/27/16 and was undetectable. 10) Ultrasound in April, 2017 was negative for  pathology as part of workup for pelvic pain.  She was last seen by Dr. Marko Plume on 08/28/15.   The patient attempted to get pregnant again.  Recently, her HCG testing was positive. Ultrasound dated 01/28/2017 confirmed diagnosis of intrauterine pregnancy. Questionable small chorionic bump (representing a small hematoma bulging into the gestational sac or failed second pregnancy in the process of resorption). Consider follow-up obstetric ultrasound at some point to exclude early developing subchorionic hemorrhage.  She feels well.  She has some sensation of morning sickness but otherwise denies any vaginal bleeding.   MEDICAL HISTORY:  Past Medical History:  Diagnosis Date  . Pyelonephritis     SURGICAL HISTORY: Past Surgical History:  Procedure Laterality Date  . DILATION AND EVACUATION N/A 08/10/2014   Procedure: DILATATION AND EVACUATION;  Surgeon: Lavonia Drafts, MD;  Location: [facility];  Service: Gynecology;  Laterality: N/A;  . HYSTEROSCOPY N/A 08/10/2014   Procedure: HYSTEROSCOPY;  Surgeon: Lavonia Drafts, MD;  Location: [facility];  Service: Gynecology;  Laterality: N/A;  . NO PAST SURGERIES      SOCIAL HISTORY: Social History   Social History  . Marital status: Single    Spouse name: N/A  . Number of children: N/A  . Years of education: N/A   Occupational History  . Not on file.   Social History Main Topics  . Smoking status: Never Smoker  . Smokeless tobacco: Never Used  . Alcohol use No  . Drug use: No  . Sexual activity: Yes    Partners: Male    Birth control/ protection: None     Comment: Stopped Depo in November 2016; trying for pregnancy   Other Topics Concern  . Not on file   Social History Narrative  . No narrative on file    FAMILY HISTORY: Family History  Problem Relation Age of Onset  . Alcohol abuse Neg Hx     ALLERGIES:  is allergic to ginger; okra; ondansetron; phenergan [promethazine]; and zofran [ondansetron  hcl].  MEDICATIONS:  Current Outpatient Prescriptions  Medication Sig Dispense Refill  . metoCLOPramide (REGLAN) 10 MG tablet Take 1 tablet (10 mg total) by mouth every 6 (six) hours. 30 tablet 0   No current facility-administered medications for this visit.    Facility-Administered Medications Ordered in Other Visits  Medication Dose Route Frequency Provider Last Rate Last Dose  . heparin lock flush 100 unit/mL  500 Units Intravenous Once Livesay, Lennis P, MD      . sodium chloride 0.9 % injection 10 mL  10 mL Intravenous PRN Livesay, Lennis P, MD        REVIEW OF SYSTEMS:   Constitutional: Denies fevers, chills or abnormal night sweats Eyes: Denies blurriness of vision, double vision or watery eyes Ears, nose, mouth, throat, and face: Denies mucositis or sore throat Respiratory: Denies cough, dyspnea or wheezes Cardiovascular: Denies palpitation, chest discomfort or lower extremity swelling Gastrointestinal:  Denies nausea, heartburn or change in bowel habits Skin: Denies abnormal skin rashes Lymphatics: Denies new lymphadenopathy or easy bruising Neurological:Denies numbness, tingling or new weaknesses Behavioral/Psych: Mood is stable, no new changes  All other systems were reviewed with the patient and are negative.  PHYSICAL EXAMINATION: ECOG PERFORMANCE STATUS: 0 - Asymptomatic  Vitals:   02/07/17 1156  BP: 123/60  Pulse: 84  Resp: 20  Temp:  97.6 F (36.4 C)   Filed Weights   02/07/17 1156  Weight: 92 lb 1.6 oz (41.8 kg)    GENERAL:alert, no distress and comfortable SKIN: skin color, texture, turgor are normal, no rashes or significant lesions EYES: normal, conjunctiva are pink and non-injected, sclera clear OROPHARYNX:no exudate, normal lips, buccal mucosa, and tongue  NECK: supple, thyroid normal size, non-tender, without nodularity LYMPH:  no palpable lymphadenopathy in the cervical, axillary or inguinal LUNGS: clear to auscultation and percussion with  normal breathing effort HEART: regular rate & rhythm and no murmurs without lower extremity edema ABDOMEN:abdomen soft, non-tender and normal bowel sounds Musculoskeletal:no cyanosis of digits and no clubbing  PSYCH: alert & oriented x 3 with fluent speech NEURO: no focal motor/sensory deficits  LABORATORY DATA:  I have reviewed the data as listed Lab Results  Component Value Date   WBC 10.6 (H) 01/28/2017   HGB 11.9 (L) 01/28/2017   HCT 36.2 01/28/2017   MCV 95.5 01/28/2017   PLT 172 01/28/2017   No results for input(s): NA, K, CL, CO2, GLUCOSE, BUN, CREATININE, CALCIUM, GFRNONAA, GFRAA, PROT, ALBUMIN, AST, ALT, ALKPHOS, BILITOT, BILIDIR, IBILI in the last 8760 hours.  RADIOGRAPHIC STUDIES: I have personally reviewed the radiological images as listed and agreed with the findings in the report. US Ob Comp Less 14 Wks  Result Date: 01/28/2017 CLINICAL DATA:  Pain in early pregnancy.  Pain for 3 days. EXAM: OBSTETRIC <14 WK [place] AND TRANSVAGINAL OB US TECHNIQUE: Both transabdominal and transvaginal ultrasound examinations were performed for complete evaluation of the gestation as well as the maternal uterus, adnexal regions, and pelvic cul-de-sac. Transvaginal technique was performed to assess early pregnancy. COMPARISON:  None. FINDINGS: Intrauterine gestational sac: Single Yolk sac:  Present Embryo:  Present Cardiac Activity: Present Heart Rate: 138  bpm MSD:   mm    w     d CRL:  2  mm   5 w   5 d                  [place] EDC: 09/23/2017 Subchorionic hemorrhage: None visualized. Questionable small chorionic bump. Maternal uterus/adnexae: Maternal right ovary is unremarkable, containing a probable small corpus luteum. No mass or free fluid in the adjacent right adnexal region. Maternal left ovary is not seen but there is no mass or free fluid identified in the left adnexal region. No significant free fluid in the cul-de-sac. IMPRESSION: 1. Single live intrauterine pregnancy with estimated gestational  age of [redacted] weeks and 5 days. No complicating features. 2. Questionable small chorionic bump (representing a small hematoma bulging into the gestational sac or failed second pregnancy in the process of resorption). Consider follow-up obstetric ultrasound at some point to exclude early developing subchorionic hemorrhage. 3. Probable small corpus luteum within the right ovary. No mass or free fluid seen within either adnexal region. Electronically Signed   By: Franki Cabot M.D.   On: 01/28/2017 18:07   US Ob Transvaginal  Result Date: 01/28/2017 CLINICAL DATA:  Pain in early pregnancy.  Pain for 3 days. EXAM: OBSTETRIC <14 WK [place] AND TRANSVAGINAL OB US TECHNIQUE: Both transabdominal and transvaginal ultrasound examinations were performed for complete evaluation of the gestation as well as the maternal uterus, adnexal regions, and pelvic cul-de-sac. Transvaginal technique was performed to assess early pregnancy. COMPARISON:  None. FINDINGS: Intrauterine gestational sac: Single Yolk sac:  Present Embryo:  Present Cardiac Activity: Present Heart Rate: 138  bpm MSD:   mm    w  d CRL:  2  mm   5 w   5 d                  [place] EDC: 09/23/2017 Subchorionic hemorrhage: None visualized. Questionable small chorionic bump. Maternal uterus/adnexae: Maternal right ovary is unremarkable, containing a probable small corpus luteum. No mass or free fluid in the adjacent right adnexal region. Maternal left ovary is not seen but there is no mass or free fluid identified in the left adnexal region. No significant free fluid in the cul-de-sac. IMPRESSION: 1. Single live intrauterine pregnancy with estimated gestational age of [redacted] weeks and 5 days. No complicating features. 2. Questionable small chorionic bump (representing a small hematoma bulging into the gestational sac or failed second pregnancy in the process of resorption). Consider follow-up obstetric ultrasound at some point to exclude early developing subchorionic hemorrhage. 3.  Probable small corpus luteum within the right ovary. No mass or free fluid seen within either adnexal region. Electronically Signed   By: Franki Cabot M.D.   On: 01/28/2017 18:07    ASSESSMENT & PLAN:  Gestational trophoblastic neoplasm The patient had rare history of gestational trophoblastic disease, status post chemotherapy The patient had seen cancer free for over almost 2 years. The patient has become pregnant again. She does not need long-term follow-up. I would defer to her primary care doctor and OB/GYN for future follow-up   No orders of the defined types were placed in this encounter.   All questions were answered. The patient knows to call the clinic with any problems, questions or concerns. I spent 15 minutes counseling the patient face to face. The total time spent in the appointment was 20 minutes and more than 50% was on counseling.     Heath Lark, MD 02/09/2017 12:49 PM

## 2017-02-09 NOTE — Assessment & Plan Note (Signed)
The patient had rare history of gestational trophoblastic disease, status post chemotherapy The patient had seen cancer free for over almost 2 years. The patient has become pregnant again. She does not need long-term follow-up. I would defer to her primary care doctor and OB/GYN for future follow-up

## 2017-02-12 ENCOUNTER — Inpatient Hospital Stay (HOSPITAL_COMMUNITY)
Admission: AD | Admit: 2017-02-12 | Discharge: 2017-02-12 | Disposition: A | Discharge status: Home / Self Care | Payer: Medicaid Other | Source: Ambulatory Visit | Attending: Obstetrics & Gynecology | Admitting: Obstetrics & Gynecology

## 2017-02-12 ENCOUNTER — Encounter (HOSPITAL_COMMUNITY): Payer: Self-pay | Admitting: *Deleted

## 2017-02-12 DIAGNOSIS — O219 Vomiting of pregnancy, unspecified: Secondary | ICD-10-CM

## 2017-02-12 DIAGNOSIS — R1030 Lower abdominal pain, unspecified: Secondary | ICD-10-CM | POA: Diagnosis present

## 2017-02-12 DIAGNOSIS — Z3A01 Less than 8 weeks gestation of pregnancy: Secondary | ICD-10-CM | POA: Diagnosis not present

## 2017-02-12 DIAGNOSIS — O211 Hyperemesis gravidarum with metabolic disturbance: Secondary | ICD-10-CM | POA: Insufficient documentation

## 2017-02-12 DIAGNOSIS — O26851 Spotting complicating pregnancy, first trimester: Secondary | ICD-10-CM

## 2017-02-12 DIAGNOSIS — E860 Dehydration: Secondary | ICD-10-CM

## 2017-02-12 LAB — URINALYSIS, ROUTINE W REFLEX MICROSCOPIC
Bilirubin Urine: NEGATIVE
Glucose, UA: NEGATIVE mg/dL
HGB URINE DIPSTICK: NEGATIVE
Ketones, ur: 80 mg/dL — AB
Leukocytes, UA: NEGATIVE
Nitrite: NEGATIVE
PROTEIN: NEGATIVE mg/dL
Specific Gravity, Urine: 1.023 (ref 1.005–1.030)
pH: 6 (ref 5.0–8.0)

## 2017-02-12 MED ORDER — METOCLOPRAMIDE HCL 10 MG PO TABS: 10.0000 mg | ORAL_TABLET | Freq: Once | ORAL | Status: DC

## 2017-02-12 NOTE — Discharge Instructions (Signed)
Dehydration, Adult Dehydration is a condition in which there is not enough fluid or water in the body. This happens when you lose more fluids than you take in. Important organs, such as the kidneys, brain, and heart, cannot function without a proper amount of fluids. Any loss of fluids from the body can lead to dehydration. Dehydration can range from mild to severe. This condition should be treated right away to prevent it from becoming severe. What are the causes? This condition may be caused by:  Vomiting.  Diarrhea.  Excessive sweating, such as from heat exposure or exercise.  Not drinking enough fluid, especially: ? When ill. ? While doing activity that requires a lot of energy.  Excessive urination.  Fever.  Infection.  Certain medicines, such as medicines that cause the body to lose excess fluid (diuretics).  Inability to access safe drinking water.  Reduced physical ability to get adequate water and food.  What increases the risk? This condition is more likely to develop in people:  Who have a poorly controlled long-term (chronic) illness, such as diabetes, heart disease, or kidney disease.  Who are age 71 or older.  Who are disabled.  Who live in a place with high altitude.  Who play endurance sports.  What are the signs or symptoms? Symptoms of mild dehydration may include:  Thirst.  Dry lips.  Slightly dry mouth.  Dry, warm skin.  Dizziness. Symptoms of moderate dehydration may include:  Very dry mouth.  Muscle cramps.  Dark urine. Urine may be the color of tea.  Decreased urine production.  Decreased tear production.  Heartbeat that is irregular or faster than normal (palpitations).  Headache.  Light-headedness, especially when you stand up from a sitting position.  Fainting (syncope). Symptoms of severe dehydration may include:  Changes in skin, such as: ? Cold and clammy skin. ? Blotchy (mottled) or pale skin. ? Skin that does  not quickly return to normal after being lightly pinched and released (poor skin turgor).  Changes in body fluids, such as: ? Extreme thirst. ? No tear production. ? Inability to sweat when body temperature is high, such as in hot weather. ? Very little urine production.  Changes in vital signs, such as: ? Weak pulse. ? Pulse that is more than 100 beats a minute when sitting still. ? Rapid breathing. ? Low blood pressure.  Other changes, such as: ? Sunken eyes. ? Cold hands and feet. ? Confusion. ? Lack of energy (lethargy). ? Difficulty waking up from sleep. ? Short-term weight loss. ? Unconsciousness. How is this diagnosed? This condition is diagnosed based on your symptoms and a physical exam. Blood and urine tests may be done to help confirm the diagnosis. How is this treated? Treatment for this condition depends on the severity. Mild or moderate dehydration can often be treated at home. Treatment should be started right away. Do not wait until dehydration becomes severe. Severe dehydration is an emergency and it needs to be treated in a hospital. Treatment for mild dehydration may include:  Drinking more fluids.  Replacing salts and minerals in your blood (electrolytes) that you may have lost. Treatment for moderate dehydration may include:  Drinking an oral rehydration solution (ORS). This is a drink that helps you replace fluids and electrolytes (rehydrate). It can be found at pharmacies and retail stores. Treatment for severe dehydration may include:  Receiving fluids through an IV tube.  Receiving an electrolyte solution through a feeding tube that is passed through your nose  and into your stomach (nasogastric tube, or NG tube).  Correcting any abnormalities in electrolytes.  Treating the underlying cause of dehydration. Follow these instructions at home:  If directed by your health care provider, drink an ORS: ? Make an ORS by following instructions on the  package. ? Start by drinking small amounts, about  cup (120 mL) every 5-10 minutes. ? Slowly increase how much you drink until you have taken the amount recommended by your health care provider.  Drink enough clear fluid to keep your urine clear or pale yellow. If you were told to drink an ORS, finish the ORS first, then start slowly drinking other clear fluids. Drink fluids such as: ? Water. Do not drink only water. Doing that can lead to having too little salt (sodium) in the body (hyponatremia). ? Ice chips. ? Fruit juice that you have added water to (diluted fruit juice). ? Low-calorie sports drinks.  Avoid: ? Alcohol. ? Drinks that contain a lot of sugar. These include high-calorie sports drinks, fruit juice that is not diluted, and soda. ? Caffeine. ? Foods that are greasy or contain a lot of fat or sugar.  Take over-the-counter and prescription medicines only as told by your health care provider.  Do not take sodium tablets. This can lead to having too much sodium in the body (hypernatremia).  Eat foods that contain a healthy balance of electrolytes, such as bananas, oranges, potatoes, tomatoes, and spinach.  Keep all follow-up visits as told by your health care provider. This is important. Contact a health care provider if:  You have abdominal pain that: ? Gets worse. ? Stays in one area (localizes).  You have a rash.  You have a stiff neck.  You are more irritable than usual.  You are sleepier or more difficult to wake up than usual.  You feel weak or dizzy.  You feel very thirsty.  You have urinated only a small amount of very dark urine over 6-8 hours. Get help right away if:  You have symptoms of severe dehydration.  You cannot drink fluids without vomiting.  Your symptoms get worse with treatment.  You have a fever.  You have a severe headache.  You have vomiting or diarrhea that: ? Gets worse. ? Does not go away.  You have blood or green matter  (bile) in your vomit.  You have blood in your stool. This may cause stool to look black and tarry.  You have not urinated in 6-8 hours.  You faint.  Your heart rate while sitting still is over 100 beats a minute.  You have trouble breathing. This information is not intended to replace advice given to you by your health care provider. Make sure you discuss any questions you have with your health care provider. Document Released: 07/08/2005 Document Revised: 02/02/2016 Document Reviewed: 09/01/2015 Elsevier Interactive Patient Education  2018 [employer].  Morning Sickness Morning sickness is when you feel sick to your stomach (nauseous) during pregnancy. This nauseous feeling may or may not come with vomiting. It often occurs in the morning but can be a problem any time of day. Morning sickness is most common during the first trimester, but it may continue throughout pregnancy. While morning sickness is unpleasant, it is usually harmless unless you develop severe and continual vomiting (hyperemesis gravidarum). This condition requires more intense treatment. What are the causes? The cause of morning sickness is not completely known but seems to be related to normal hormonal changes that occur in  pregnancy. What increases the risk? You are at greater risk if you:  Experienced nausea or vomiting before your pregnancy.  Had morning sickness during a previous pregnancy.  Are pregnant with more than one baby, such as twins.  How is this treated? Do not use any medicines (prescription, over-the-counter, or herbal) for morning sickness without first talking to your health care provider. Your health care provider may prescribe or recommend:  Vitamin B6 supplements.  Anti-nausea medicines.  The herbal medicine ginger.  Follow these instructions at home:  Only take over-the-counter or prescription medicines as directed by your health care provider.  Taking multivitamins before getting  pregnant can prevent or decrease the severity of morning sickness in most women.  Eat a piece of dry toast or unsalted crackers before getting out of bed in the morning.  Eat five or six small meals a day.  Eat dry and bland foods (rice, baked potato). Foods high in carbohydrates are often helpful.  Do not drink liquids with your meals. Drink liquids between meals.  Avoid greasy, fatty, and spicy foods.  Get someone to cook for you if the smell of any food causes nausea and vomiting.  If you feel nauseous after taking prenatal vitamins, take the vitamins at night or with a snack.  Snack on protein foods (nuts, yogurt, cheese) between meals if you are hungry.  Eat unsweetened gelatins for desserts.  Wearing an acupressure wristband (worn for sea sickness) may be helpful.  Acupuncture may be helpful.  Do not smoke.  Get a humidifier to keep the air in your house free of odors.  Get plenty of fresh air. Contact a health care provider if:  Your home remedies are not working, and you need medicine.  You feel dizzy or lightheaded.  You are losing weight. Get help right away if:  You have persistent and uncontrolled nausea and vomiting.  You pass out (faint). This information is not intended to replace advice given to you by your health care provider. Make sure you discuss any questions you have with your health care provider. Document Released: 08/29/2006 Document Revised: 12/14/2015 Document Reviewed: 12/23/2012 Elsevier Interactive Patient Education  2017 [employer].

## 2017-02-12 NOTE — MAU Provider Note (Signed)
History     CSN: [id number]  Arrival date and time: 02/12/17 1307   First Provider Initiated Contact with Patient 02/12/17 1358      Chief Complaint  Patient presents with  . Fever  . Vaginal Bleeding   Shelly Koch is a 26 y.o. G2P1001 at [redacted]w[redacted]d by [place] at [redacted]w[redacted]d showing living IUP presenting with intermittent suprapubic lower abdominal pain over the past 2 weeks and noted pink blood on toilet tissue after wiping today.  States she had fever of 102.1 with dizziness and malaise yesterday and took Tylenol; no fever or antipyretic today. Has N/V of pregnancy and is slightly nauseated now. Took Reglan about 3 hours ago but vomited that and food shortly after taking it. Retaining only water today. Declines IVF for rehydration and declines IV antiemetic. Allergic to Phenergan and Zofran. "a little" nauseated now; sipping water.  Has NOB appointment 03/05/17.       Past Medical History:  Diagnosis Date  . Pyelonephritis     Past Surgical History:  Procedure Laterality Date  . DILATION AND EVACUATION N/A 08/10/2014   Procedure: DILATATION AND EVACUATION;  Surgeon: Lavonia Drafts, MD;  Location: [facility];  Service: Gynecology;  Laterality: N/A;  . HYSTEROSCOPY N/A 08/10/2014   Procedure: HYSTEROSCOPY;  Surgeon: Lavonia Drafts, MD;  Location: [facility];  Service: Gynecology;  Laterality: N/A;  . NO PAST SURGERIES      Family History  Problem Relation Age of Onset  . Alcohol abuse Neg Hx     Social History  Substance Use Topics  . Smoking status: Never Smoker  . Smokeless tobacco: Never Used  . Alcohol use No    Allergies:  Allergies  Allergen Reactions  . Ginger Hives and Itching    Pt states "not allergic"  . Okra     Hives and itching  . Ondansetron Hives and Itching  . Phenergan [Promethazine] Hives and Itching    Note: pt tolerates PO COMPAZINE  . Zofran [Ondansetron Hcl] Rash    Prescriptions Prior to Admission  Medication Sig Dispense Refill Last Dose   . metoCLOPramide (REGLAN) 10 MG tablet Take 1 tablet (10 mg total) by mouth every 6 (six) hours. 30 tablet 0     Review of Systems  Constitutional: Positive for appetite change, fatigue and fever.  HENT: Positive for congestion and postnasal drip. Negative for sore throat.   Respiratory: Positive for cough. Negative for chest tightness, shortness of breath and wheezing.   Cardiovascular: Negative for chest pain.  Gastrointestinal: Positive for abdominal pain, nausea and vomiting. Negative for constipation and diarrhea.  Genitourinary: Positive for frequency, pelvic pain and vaginal bleeding. Negative for dysuria, flank pain, urgency, vaginal discharge and vaginal pain.       No vaginal pruritis or irritative discharge  Musculoskeletal: Negative for back pain.  Neurological: Positive for dizziness. Negative for light-headedness.       Dizzy episode yesterday, none today  Psychiatric/Behavioral: The patient is nervous/anxious.    Physical Exam   Blood pressure 101/64, pulse 83, temperature 98.1 F (36.7 C), resp. rate 16, last menstrual period 11/30/2016.  Physical Exam  Nursing note and vitals reviewed. Constitutional: She is oriented to person, place, and time. She appears well-developed and well-nourished. No distress.  HENT:  Head: Atraumatic.  Eyes: No scleral icterus.  Neck: Neck supple. No thyromegaly present.  Cardiovascular: Normal rate and regular rhythm.   Respiratory: Effort normal and breath sounds normal.  GI: Soft. There is no tenderness.  Genitourinary: Vagina normal.  No vaginal discharge found.  Genitourinary Comments: Pelvic: NEFG, speculum deferred VE: scant discharge, no blood: cx, long closed. Uterus 6-8 wk size, NT   Musculoskeletal: Normal range of motion.  No CVAT  Neurological: She is alert and oriented to person, place, and time.  Skin: Skin is warm and dry.  Psychiatric: She has a normal mood and affect.    MAU Course  Procedures Bedside US by  me confirming viable IUP with HR 160s.  Reglan 10 mg po > patient declined and will take her med at home. No nausea and tolerating fluids at time of discharge.  Results for orders placed or performed during the hospital encounter of 02/12/17 (from the past 24 hour(s))  Urinalysis, Routine w reflex microscopic     Status: Abnormal   Collection Time: 02/12/17  1:23 PM  Result Value Ref Range   Color, Urine YELLOW YELLOW   APPearance HAZY (A) CLEAR   Specific Gravity, Urine 1.023 1.005 - 1.030   pH 6.0 5.0 - 8.0   Glucose, UA NEGATIVE NEGATIVE mg/dL   Hgb urine dipstick NEGATIVE NEGATIVE   Bilirubin Urine NEGATIVE NEGATIVE   Ketones, ur 80 (A) NEGATIVE mg/dL   Protein, ur NEGATIVE NEGATIVE mg/dL   Nitrite NEGATIVE NEGATIVE   Leukocytes, UA NEGATIVE NEGATIVE   MDM No fever here. DIzziness and reported fever may have been related to dehydration. No VB at present. Will discharge home with reassurance, pelvic rest until NOB appointment.  Assessment and Plan   G2P1001 at [redacted]w[redacted]d 1. Spotting affecting pregnancy in first trimester   2. Nausea and vomiting during pregnancy prior to [redacted] weeks gestation   3. Dehydration    Allergies as of 02/12/2017      Reactions   Okra    Hives and itching   Ondansetron Hives, Itching   Phenergan [promethazine] Hives, Itching   Note: pt tolerates PO COMPAZINE   Zofran [ondansetron Hcl] Rash      Medication List    TAKE these medications   acetaminophen 325 MG tablet Commonly known as:  TYLENOL Take 650 mg by mouth every 6 (six) hours as needed for moderate pain or fever.   metoCLOPramide 10 MG tablet Commonly known as:  REGLAN Take 1 tablet (10 mg total) by mouth every 6 (six) hours.   prenatal multivitamin Tabs tablet Take 1 tablet by mouth daily at 12 noon.      Follow-up [facility] for [place] Follow up on 03/05/2017.   Specialty:  Obstetrics and Gynecology Contact information: [place], [place], CNM 02/12/2017 2:38 PM  ,

## 2017-02-12 NOTE — MAU Note (Addendum)
Pt presents to MAU with complaints of lower abdominal pain that comes and goes for a couple of weeks. PT states she has had a fever for two days. Scant amount of vaginal bleeding when she wipes. + N/V, taking reglan but states it is not helping

## 2017-03-05 ENCOUNTER — Encounter: Payer: Self-pay | Admitting: Obstetrics and Gynecology

## 2017-03-05 ENCOUNTER — Other Ambulatory Visit (HOSPITAL_COMMUNITY)
Admission: RE | Admit: 2017-03-05 | Discharge: 2017-03-05 | Disposition: A | Discharge status: Home / Self Care | Payer: Medicaid Other | Source: Ambulatory Visit | Attending: Obstetrics and Gynecology | Admitting: Obstetrics and Gynecology

## 2017-03-05 ENCOUNTER — Ambulatory Visit (INDEPENDENT_AMBULATORY_CARE_PROVIDER_SITE_OTHER): Payer: Medicaid Other | Admitting: Obstetrics and Gynecology

## 2017-03-05 VITALS — BP 123/71 | HR 97 | Wt 88.2 lb

## 2017-03-05 DIAGNOSIS — Z3480 Encounter for supervision of other normal pregnancy, unspecified trimester: Secondary | ICD-10-CM | POA: Diagnosis not present

## 2017-03-05 DIAGNOSIS — Z87898 Personal history of other specified conditions: Secondary | ICD-10-CM

## 2017-03-05 DIAGNOSIS — Z331 Pregnant state, incidental: Secondary | ICD-10-CM

## 2017-03-05 DIAGNOSIS — Z1389 Encounter for screening for other disorder: Secondary | ICD-10-CM

## 2017-03-05 DIAGNOSIS — O019 Hydatidiform mole, unspecified: Secondary | ICD-10-CM

## 2017-03-05 DIAGNOSIS — Z8759 Personal history of other complications of pregnancy, childbirth and the puerperium: Secondary | ICD-10-CM | POA: Diagnosis present

## 2017-03-05 DIAGNOSIS — O0990 Supervision of high risk pregnancy, unspecified, unspecified trimester: Secondary | ICD-10-CM | POA: Insufficient documentation

## 2017-03-05 DIAGNOSIS — O219 Vomiting of pregnancy, unspecified: Secondary | ICD-10-CM | POA: Diagnosis not present

## 2017-03-05 DIAGNOSIS — O0991 Supervision of high risk pregnancy, unspecified, first trimester: Secondary | ICD-10-CM

## 2017-03-05 DIAGNOSIS — Z3481 Encounter for supervision of other normal pregnancy, first trimester: Secondary | ICD-10-CM

## 2017-03-05 DIAGNOSIS — Z113 Encounter for screening for infections with a predominantly sexual mode of transmission: Secondary | ICD-10-CM

## 2017-03-05 LAB — POCT URINALYSIS DIP (DEVICE)
Bilirubin Urine: NEGATIVE
Glucose, UA: 100 mg/dL — AB
HGB URINE DIPSTICK: NEGATIVE
Ketones, ur: NEGATIVE mg/dL
NITRITE: NEGATIVE
PH: 6.5 (ref 5.0–8.0)
Protein, ur: NEGATIVE mg/dL
Specific Gravity, Urine: 1.01 (ref 1.005–1.030)
Urobilinogen, UA: 0.2 mg/dL (ref 0.0–1.0)

## 2017-03-05 MED ORDER — DOXYLAMINE-PYRIDOXINE ER 20-20 MG PO TBCR: 1.0000 | EXTENDED_RELEASE_TABLET | Freq: Every day | ORAL | 1 refills | Status: DC

## 2017-03-05 NOTE — Progress Notes (Signed)
New OB Note  03/05/2017   Clinic: [facility]  Chief Complaint: NOB  Transfer of Care Patient: no  History of Present Illness: Ms. Kindler is a 26 y.o. G2P1001 @ 10/6 weeks ([place] 3/7, based on 5wk u/s) Patient's last menstrual period was 11/30/2016.  Preg complicated by has Supervision of high risk pregnancy, antepartum; Undernutrition; Anemia; History of poor fetal growth; Retained products of conception after delivery without hemorrhage; Gestational trophoblastic neoplasm; Environmental and seasonal allergies; Underweight due to inadequate caloric intake; and Supervision of other normal pregnancy, antepartum on her problem list.   Any events prior to today's visit: no She has Positive signs or symptoms of nausea/vomiting of pregnancy. She has Negative signs or symptoms of miscarriage or preterm labor On any different medications around the time she conceived/early pregnancy: No   ROS: A 12-point review of systems was performed and negative, except as stated in the above HPI.  OBGYN History: As per HPI. OB History  Gravida Para Term Preterm AB Living  2 1 1     1   SAB TAB Ectopic Multiple Live Births        0 1    # Outcome Date GA Lbr Len/2nd Weight Sex Delivery Anes PTL Lv  2 Current           1 Term 06/05/14 [redacted]w[redacted]d 02:58 / 00:45 4 lb 10.4 oz (2.11 kg) F Vag-Spont Local  LIV     Birth Comments: NONE      Any issues with any prior pregnancies: Yes, PP GTN Prior children are healthy, doing well, and without any problems or issues: yes History of pap smears: Yes. Last pap smear 2017 and results were NILM   Past Medical History: Past Medical History:  Diagnosis Date  . Gestational trophoblastic neoplasm   . PICC (peripherally inserted central catheter) in place 12/24/2014  . Pyelonephritis     Past Surgical History: Past Surgical History:  Procedure Laterality Date  . DILATION AND EVACUATION N/A 08/10/2014   Procedure: DILATATION AND EVACUATION;   Surgeon: Lavonia Drafts, MD;  Location: [facility];  Service: Gynecology;  Laterality: N/A;  . HYSTEROSCOPY N/A 08/10/2014   Procedure: HYSTEROSCOPY;  Surgeon: Lavonia Drafts, MD;  Location: [facility];  Service: Gynecology;  Laterality: N/A;    Family History:  Family History  Problem Relation Age of Onset  . Alcohol abuse Neg Hx    She denies any history of mental retardation, birth defects or genetic disorders in her or the FOB's history  Social History:  Social History   Social History  . Marital status: Single    Spouse name: N/A  . Number of children: N/A  . Years of education: N/A   Occupational History  . Not on file.   Social History Main Topics  . Smoking status: Never Smoker  . Smokeless tobacco: Never Used  . Alcohol use No  . Drug use: No  . Sexual activity: Yes    Partners: Male    Birth control/ protection: None   Other Topics Concern  . Not on file   Social History Narrative  . No narrative on file    Allergy: Allergies  Allergen Reactions  . Okra     Hives and itching  . Ondansetron Hives and Itching  . Phenergan [Promethazine] Hives and Itching    Note: pt tolerates PO COMPAZINE  . Zofran [Ondansetron Hcl] Rash    Health Maintenance:  Mammogram Up to Date: not applicable  Current Outpatient Medications: PNV,  reglan PRN  Physical Exam:   BP 123/71   Pulse 97   Wt 88 lb 3.2 oz (40 kg)   LMP 11/30/2016   BMI 19.77 kg/m  Body mass index is 19.77 kg/m. Contractions: Not present Vag. Bleeding: None. Fundal height: not applicable FHTs: 119J  General appearance: Well nourished, well developed female in no acute distress.  Neck:  Supple, normal appearance, and no thyromegaly  Cardiovascular: S1, S2 normal, no murmur, rub or gallop, regular rate and rhythm Respiratory:  Clear to auscultation bilateral. Normal respiratory effort Abdomen: positive bowel sounds and no masses, hernias; diffusely non tender to palpation, non  distended Neuro/Psych:  Normal mood and affect.  Skin:  Warm and dry.  Lymphatic:  No inguinal lymphadenopathy.   Pelvic exam: is not limited by body habitus EGBUS: within normal limits, Vagina: within normal limits and with no blood in the vault, Cervix: normal appearing cervix without discharge or lesions, closed/long/high, Uterus:  enlarged, c/w 10 week size, and Adnexa:  normal adnexa and no mass, fullness, tenderness  Laboratory: none  Imaging:  7/10: SLIUP @ 5/5 wks  Assessment: pt doing well  Plan: 1. Supervision of other normal pregnancy, antepartum Routine care. Start bonjesta. Will see back in 2wks for weight check visit. Recommend 35-40lbs weight gain. Consider nutrition consult if not gaining weight appropriately.  - [place] MFM Fetal Nuchal Translucency; Future - Obstetric Panel, Including HIV - Urine Culture - TSH - Protein / Creatinine Ratio, Urine - Comprehensive metabolic panel - Cystic fibrosis gene test - SMN1 Copy Number Analysis - [place] MFM OB DETAIL +14 WK; Future  2. Supervision of high risk pregnancy, antepartum  3. History of poor fetal growth Recommend close [facility] and serial growth scans starting at 24wks  4. Gestational trophoblastic neoplasm S/p MTX and actinomycin-D. Check TSH and CMP Placenta to path. Close PP beta f/u.   5. H/o gHTN Start baby ASA after 12wks. Baseline pre-x labs   Problem list reviewed and updated.  Follow up in 2 weeks.  >50% of 25 min visit spent on counseling and coordination of care.     Durene Romans MD Attending Center for [facility])

## 2017-03-06 LAB — OBSTETRIC PANEL, INCLUDING HIV
ANTIBODY SCREEN: NEGATIVE
BASOS: 0 %
Basophils Absolute: 0 10*3/uL (ref 0.0–0.2)
EOS (ABSOLUTE): 0.2 10*3/uL (ref 0.0–0.4)
EOS: 2 %
HEMATOCRIT: 31.7 % — AB (ref 34.0–46.6)
HEMOGLOBIN: 10.2 g/dL — AB (ref 11.1–15.9)
HIV SCREEN 4TH GENERATION: NONREACTIVE
Hepatitis B Surface Ag: NEGATIVE
IMMATURE GRANS (ABS): 0 10*3/uL (ref 0.0–0.1)
Immature Granulocytes: 0 %
LYMPHS ABS: 3.1 10*3/uL (ref 0.7–3.1)
LYMPHS: 36 %
MCH: 30.6 pg (ref 26.6–33.0)
MCHC: 32.2 g/dL (ref 31.5–35.7)
MCV: 95 fL (ref 79–97)
MONOS ABS: 0.5 10*3/uL (ref 0.1–0.9)
Monocytes: 5 %
Neutrophils Absolute: 4.9 10*3/uL (ref 1.4–7.0)
Neutrophils: 57 %
Platelets: 183 10*3/uL (ref 150–379)
RBC: 3.33 x10E6/uL — AB (ref 3.77–5.28)
RDW: 13 % (ref 12.3–15.4)
RH TYPE: POSITIVE
RPR Ser Ql: NONREACTIVE
Rubella Antibodies, IGG: 1.43 index (ref >0.99)
WBC: 8.7 10*3/uL (ref 3.4–10.8)

## 2017-03-06 LAB — CERVICOVAGINAL ANCILLARY ONLY
BACTERIAL VAGINITIS: NEGATIVE
CANDIDA VAGINITIS: NEGATIVE
CHLAMYDIA, DNA PROBE: NEGATIVE
Neisseria Gonorrhea: NEGATIVE
Trichomonas: NEGATIVE

## 2017-03-06 LAB — COMPREHENSIVE METABOLIC PANEL
A/G RATIO: 1.4 (ref 1.2–2.2)
ALBUMIN: 4 g/dL (ref 3.5–5.5)
ALT: 9 IU/L (ref 0–32)
AST: 12 IU/L (ref 0–40)
Alkaline Phosphatase: 44 IU/L (ref 39–117)
BILIRUBIN TOTAL: 0.4 mg/dL (ref 0.0–1.2)
BUN/Creatinine Ratio: 8 — ABNORMAL LOW (ref 9–23)
BUN: 3 mg/dL — ABNORMAL LOW (ref 6–20)
CALCIUM: 9.1 mg/dL (ref 8.7–10.2)
CHLORIDE: 98 mmol/L (ref 96–106)
CO2: 22 mmol/L (ref 20–29)
Creatinine, Ser: 0.4 mg/dL — ABNORMAL LOW (ref 0.57–1.00)
GFR, EST AFRICAN AMERICAN: 165 mL/min/{1.73_m2} (ref >59)
GFR, EST NON AFRICAN AMERICAN: 143 mL/min/{1.73_m2} (ref >59)
GLOBULIN, TOTAL: 2.8 g/dL (ref 1.5–4.5)
Glucose: 90 mg/dL (ref 65–99)
POTASSIUM: 3.7 mmol/L (ref 3.5–5.2)
Sodium: 137 mmol/L (ref 134–144)
TOTAL PROTEIN: 6.8 g/dL (ref 6.0–8.5)

## 2017-03-06 LAB — TSH: TSH: 0.931 u[IU]/mL (ref 0.450–4.500)

## 2017-03-06 LAB — PROTEIN / CREATININE RATIO, URINE
Creatinine, Urine: 19 mg/dL
Protein, Ur: <4 mg/dL

## 2017-03-07 LAB — URINE CULTURE, OB REFLEX

## 2017-03-07 LAB — CULTURE, OB URINE

## 2017-03-11 ENCOUNTER — Telehealth: Payer: Self-pay | Admitting: *Deleted

## 2017-03-11 NOTE — Telephone Encounter (Signed)
Pt left message stating that she got a voicemail from her pharmacy stating that the medication prescribed is not covered by Medicaid. I called pt's pharmacy and was advised that the [facility] requires prior authorization.

## 2017-03-12 NOTE — Telephone Encounter (Signed)
Called medicaid and prior Josem Kaufmann is pending over next 24 hours. Called and informed patient. Told her to call her pharmacy tomorrow evening or Friday morning to check on status. Patient verbalized understanding & had no questions

## 2017-03-13 LAB — SMN1 COPY NUMBER ANALYSIS (SMA CARRIER SCREENING)

## 2017-03-13 LAB — CYSTIC FIBROSIS GENE TEST

## 2017-03-18 ENCOUNTER — Other Ambulatory Visit: Payer: Self-pay | Admitting: Obstetrics and Gynecology

## 2017-03-18 ENCOUNTER — Encounter (HOSPITAL_COMMUNITY): Payer: Self-pay

## 2017-03-18 ENCOUNTER — Ambulatory Visit (HOSPITAL_COMMUNITY)
Admission: RE | Admit: 2017-03-18 | Discharge: 2017-03-18 | Disposition: A | Discharge status: Home / Self Care | Payer: Medicaid Other | Source: Ambulatory Visit | Attending: Obstetrics and Gynecology | Admitting: Obstetrics and Gynecology

## 2017-03-18 DIAGNOSIS — O09291 Supervision of pregnancy with other poor reproductive or obstetric history, first trimester: Secondary | ICD-10-CM | POA: Insufficient documentation

## 2017-03-18 DIAGNOSIS — Z3A12 12 weeks gestation of pregnancy: Secondary | ICD-10-CM | POA: Insufficient documentation

## 2017-03-18 DIAGNOSIS — Z3480 Encounter for supervision of other normal pregnancy, unspecified trimester: Secondary | ICD-10-CM

## 2017-03-18 DIAGNOSIS — Z3682 Encounter for antenatal screening for nuchal translucency: Secondary | ICD-10-CM

## 2017-03-18 DIAGNOSIS — O2611 Low weight gain in pregnancy, first trimester: Secondary | ICD-10-CM

## 2017-03-18 DIAGNOSIS — O2311 Infections of bladder in pregnancy, first trimester: Secondary | ICD-10-CM | POA: Insufficient documentation

## 2017-03-20 ENCOUNTER — Ambulatory Visit (INDEPENDENT_AMBULATORY_CARE_PROVIDER_SITE_OTHER): Payer: Medicaid Other | Admitting: Obstetrics & Gynecology

## 2017-03-20 VITALS — BP 117/68 | HR 72 | Wt 88.0 lb

## 2017-03-20 DIAGNOSIS — Z3480 Encounter for supervision of other normal pregnancy, unspecified trimester: Secondary | ICD-10-CM

## 2017-03-20 DIAGNOSIS — Z8759 Personal history of other complications of pregnancy, childbirth and the puerperium: Secondary | ICD-10-CM

## 2017-03-20 DIAGNOSIS — Z3483 Encounter for supervision of other normal pregnancy, third trimester: Secondary | ICD-10-CM

## 2017-03-20 MED ORDER — ASPIRIN EC 81 MG PO TBEC: 81.0000 mg | DELAYED_RELEASE_TABLET | Freq: Every day | ORAL | 2 refills | Status: DC

## 2017-03-20 NOTE — Patient Instructions (Signed)

## 2017-03-20 NOTE — Progress Notes (Signed)
   PRENATAL VISIT NOTE  Subjective:  Floretta Petro is a 26 y.o. G2P1001 at [redacted]w[redacted]d being seen today for ongoing prenatal care.  She is currently monitored for the following issues for this low-risk pregnancy and has Undernutrition; Anemia; History of poor fetal growth; Gestational trophoblastic neoplasm; Environmental and seasonal allergies; Underweight due to inadequate caloric intake; Supervision of other normal pregnancy, antepartum; and History of gestational hypertension on her problem list.  Patient reports no complaints.  Contractions: Not present. Vag. Bleeding: None.  Movement: Absent. Denies leaking of fluid.   The following portions of the patient's history were reviewed and updated as appropriate: allergies, current medications, past family history, past medical history, past social history, past surgical history and problem list. Problem list updated.  Objective:   Vitals:   03/20/17 1614  BP: 117/68  Pulse: 72  Weight: 88 lb (39.9 kg)    Fetal Status: Fetal Heart Rate (bpm): 158   Movement: Absent     General:  Alert, oriented and cooperative. Patient is in no acute distress.  Skin: Skin is warm and dry. No rash noted.   Cardiovascular: Normal heart rate noted  Respiratory: Normal respiratory effort, no problems with respiration noted  Abdomen: Soft, gravid, appropriate for gestational age.  Pain/Pressure: Absent     Pelvic: Cervical exam deferred        Extremities: Normal range of motion.  Edema: None  Mental Status:  Normal mood and affect. Normal behavior. Normal judgment and thought content.   Assessment and Plan:  Pregnancy: G2P1001 at [redacted]w[redacted]d  1. History of gestational hypertension ASA recommended, she is hesitant for now. Will do research about this before taking it.  - aspirin EC 81 MG tablet; Take 1 tablet (81 mg total) by mouth daily. Take after 12 weeks for prevention of preeclampsia later in pregnancy  Dispense: 300 tablet; Refill: 2  2. Supervision of  other normal pregnancy, antepartum Results reviewed with patient. Normal NT, first screen pending.  No other complaints or concerns.  Routine obstetric precautions reviewed. Please refer to After Visit Summary for other counseling recommendations.  Return in about 4 weeks (around 04/17/2017) for OB Visit (LOB) and  AFP only screen.   Verita Schneiders, MD

## 2017-03-21 ENCOUNTER — Other Ambulatory Visit (HOSPITAL_COMMUNITY): Payer: Medicaid Other

## 2017-03-21 ENCOUNTER — Ambulatory Visit (HOSPITAL_COMMUNITY): Payer: Medicaid Other

## 2017-04-01 ENCOUNTER — Encounter: Payer: Self-pay | Admitting: *Deleted

## 2017-04-07 ENCOUNTER — Inpatient Hospital Stay (HOSPITAL_COMMUNITY)
Admission: AD | Admit: 2017-04-07 | Discharge: 2017-04-07 | Disposition: A | Discharge status: Home / Self Care | Payer: Medicaid Other | Source: Ambulatory Visit | Attending: Obstetrics and Gynecology | Admitting: Obstetrics and Gynecology

## 2017-04-07 ENCOUNTER — Encounter (HOSPITAL_COMMUNITY): Payer: Self-pay

## 2017-04-07 DIAGNOSIS — Z3A15 15 weeks gestation of pregnancy: Secondary | ICD-10-CM | POA: Diagnosis not present

## 2017-04-07 DIAGNOSIS — O9A112 Malignant neoplasm complicating pregnancy, second trimester: Secondary | ICD-10-CM | POA: Diagnosis not present

## 2017-04-07 DIAGNOSIS — B9789 Other viral agents as the cause of diseases classified elsewhere: Secondary | ICD-10-CM

## 2017-04-07 DIAGNOSIS — R509 Fever, unspecified: Secondary | ICD-10-CM | POA: Diagnosis present

## 2017-04-07 DIAGNOSIS — Z7982 Long term (current) use of aspirin: Secondary | ICD-10-CM | POA: Diagnosis not present

## 2017-04-07 DIAGNOSIS — O98512 Other viral diseases complicating pregnancy, second trimester: Secondary | ICD-10-CM | POA: Diagnosis not present

## 2017-04-07 DIAGNOSIS — O99513 Diseases of the respiratory system complicating pregnancy, third trimester: Secondary | ICD-10-CM | POA: Insufficient documentation

## 2017-04-07 DIAGNOSIS — R05 Cough: Secondary | ICD-10-CM | POA: Diagnosis not present

## 2017-04-07 DIAGNOSIS — O9989 Other specified diseases and conditions complicating pregnancy, childbirth and the puerperium: Secondary | ICD-10-CM

## 2017-04-07 DIAGNOSIS — R109 Unspecified abdominal pain: Secondary | ICD-10-CM | POA: Diagnosis present

## 2017-04-07 DIAGNOSIS — D392 Neoplasm of uncertain behavior of placenta: Secondary | ICD-10-CM | POA: Diagnosis not present

## 2017-04-07 DIAGNOSIS — O26892 Other specified pregnancy related conditions, second trimester: Secondary | ICD-10-CM | POA: Diagnosis present

## 2017-04-07 DIAGNOSIS — J029 Acute pharyngitis, unspecified: Secondary | ICD-10-CM | POA: Diagnosis not present

## 2017-04-07 DIAGNOSIS — B349 Viral infection, unspecified: Secondary | ICD-10-CM | POA: Insufficient documentation

## 2017-04-07 DIAGNOSIS — J069 Acute upper respiratory infection, unspecified: Secondary | ICD-10-CM

## 2017-04-07 DIAGNOSIS — J028 Acute pharyngitis due to other specified organisms: Secondary | ICD-10-CM

## 2017-04-07 HISTORY — DX: Gestational diabetes mellitus in pregnancy, unspecified control: O24.419

## 2017-04-07 HISTORY — DX: Anemia, unspecified: D64.9

## 2017-04-07 LAB — URINALYSIS, ROUTINE W REFLEX MICROSCOPIC
Bilirubin Urine: NEGATIVE
GLUCOSE, UA: NEGATIVE mg/dL
HGB URINE DIPSTICK: NEGATIVE
KETONES UR: NEGATIVE mg/dL
LEUKOCYTES UA: NEGATIVE
Nitrite: NEGATIVE
PH: 7 (ref 5.0–8.0)
PROTEIN: NEGATIVE mg/dL
Specific Gravity, Urine: 1.006 (ref 1.005–1.030)

## 2017-04-07 LAB — CBC
HCT: 29.3 % — ABNORMAL LOW (ref 36.0–46.0)
Hemoglobin: 10 g/dL — ABNORMAL LOW (ref 12.0–15.0)
MCH: 33 pg (ref 26.0–34.0)
MCHC: 34.1 g/dL (ref 30.0–36.0)
MCV: 96.7 fL (ref 78.0–100.0)
Platelets: 156 10*3/uL (ref 150–400)
RBC: 3.03 MIL/uL — ABNORMAL LOW (ref 3.87–5.11)
RDW: 13.2 % (ref 11.5–15.5)
WBC: 8.8 10*3/uL (ref 4.0–10.5)

## 2017-04-07 LAB — INFLUENZA PANEL BY PCR (TYPE A & B)
INFLAPCR: NEGATIVE
Influenza B By PCR: NEGATIVE

## 2017-04-07 NOTE — MAU Note (Signed)
Pt. Presents with cough, sore throat and says she had a fever of 102 degrees farenheit yesterday. She took tylenol at 3:00pm today. She also states that she's been having intermittent contractions since this morning at 8:00am.

## 2017-04-07 NOTE — Discharge Instructions (Signed)
Cough, Adult Coughing is a reflex that clears your throat and your airways. Coughing helps to heal and protect your lungs. It is normal to cough occasionally, but a cough that happens with other symptoms or lasts a long time may be a sign of a condition that needs treatment. A cough may last only 2-3 weeks (acute), or it may last longer than 8 weeks (chronic). What are the causes? Coughing is commonly caused by:  Breathing in substances that irritate your lungs.  A viral or bacterial respiratory infection.  Allergies.  Asthma.  Postnasal drip.  Smoking.  Acid backing up from the stomach into the esophagus (gastroesophageal reflux).  Certain medicines.  Chronic lung problems, including COPD (or rarely, lung cancer).  Other medical conditions such as heart failure.  Follow these instructions at home: Pay attention to any changes in your symptoms. Take these actions to help with your discomfort:  Take medicines only as told by your health care provider. ? If you were prescribed an antibiotic medicine, take it as told by your health care provider. Do not stop taking the antibiotic even if you start to feel better. ? Talk with your health care provider before you take a cough suppressant medicine.  Drink enough fluid to keep your urine clear or pale yellow.  If the air is dry, use a cold steam vaporizer or humidifier in your bedroom or your home to help loosen secretions.  Avoid anything that causes you to cough at work or at home.  If your cough is worse at night, try sleeping in a semi-upright position.  Avoid cigarette smoke. If you smoke, quit smoking. If you need help quitting, ask your health care provider.  Avoid caffeine.  Avoid alcohol.  Rest as needed.  Contact a health care provider if:  You have new symptoms.  You cough up pus.  Your cough does not get better after 2-3 weeks, or your cough gets worse.  You cannot control your cough with suppressant  medicines and you are losing sleep.  You develop pain that is getting worse or pain that is not controlled with pain medicines.  You have a fever.  You have unexplained weight loss.  You have night sweats. Get help right away if:  You cough up blood.  You have difficulty breathing.  Your heartbeat is very fast. This information is not intended to replace advice given to you by your health care provider. Make sure you discuss any questions you have with your health care provider. Document Released: 01/04/2011 Document Revised: 12/14/2015 Document Reviewed: 09/14/2014 Elsevier Interactive Patient Education  2017 [place] [employer] A cool mist vaporizer is a device that releases a cool mist into the air. If you have a cough or a cold, using a vaporizer may help relieve your symptoms. The mist adds moisture to the air, which may help thin your mucus and make it less sticky. When your mucus is thin and less sticky, it easier for you to breathe and to cough up secretions. Do not use a vaporizer if you are allergic to mold. Follow these instructions at home:  Follow the instructions that come with the vaporizer.  Do not use anything other than distilled water in the vaporizer.  Do not run the vaporizer all of the time. Doing that can cause mold or bacteria to grow in the vaporizer.  Clean the vaporizer after each time that you use it.  Clean and dry the vaporizer well before storing it.  Stop using the vaporizer if your breathing symptoms get worse. This information is not intended to replace advice given to you by your health care provider. Make sure you discuss any questions you have with your health care provider. Document Released: 04/04/2004 Document Revised: 01/26/2016 Document Reviewed: 10/07/2015 Elsevier Interactive Patient Education  2018 [place].   Pharyngitis Pharyngitis is a sore throat (pharynx). There is redness, pain, and swelling of your  throat. Follow these instructions at home:  Drink enough fluids to keep your pee (urine) clear or pale yellow.  Only take medicine as told by your doctor. ? You may get sick again if you do not take medicine as told. Finish your medicines, even if you start to feel better. ? Do not take aspirin.  Rest.  Rinse your mouth (gargle) with salt water ( tsp of salt per 1 qt of water) every 1-2 hours. This will help the pain.  If you are not at risk for choking, you can suck on hard candy or sore throat lozenges. Contact a doctor if:  You have large, tender lumps on your neck.  You have a rash.  You cough up green, yellow-brown, or bloody spit. Get help right away if:  You have a stiff neck.  You drool or cannot swallow liquids.  You throw up (vomit) or are not able to keep medicine or liquids down.  You have very bad pain that does not go away with medicine.  You have problems breathing (not from a stuffy nose). This information is not intended to replace advice given to you by your health care provider. Make sure you discuss any questions you have with your health care provider. Document Released: 12/25/2007 Document Revised: 12/14/2015 Document Reviewed: 03/15/2013 Elsevier Interactive Patient Education  2017 [employer].

## 2017-04-07 NOTE — MAU Provider Note (Signed)
History     CSN: [id number]  Arrival date and time: 04/07/17 1748   None     Chief Complaint  Patient presents with  . Abdominal Pain  . Fever  . Cough   HPI 26 yo G2P1001 at [redacted]w[redacted]d here for the evaluation of fever, cough and sore throat since Saturday. Patient reports temperature as high as 102 on Saturday. She did not repeat her temperature since. She denies sneezing. She denies any cramping or vaginal bleeding.  OB History    Gravida Para Term Preterm AB Living   2 1 1     1    SAB TAB Ectopic Multiple Live Births         0 1      Past Medical History:  Diagnosis Date  . Gestational trophoblastic neoplasm   . PICC (peripherally inserted central catheter) in place 12/24/2014  . Pyelonephritis     Past Surgical History:  Procedure Laterality Date  . DILATION AND EVACUATION N/A 08/10/2014   Procedure: DILATATION AND EVACUATION;  Surgeon: Lavonia Drafts, MD;  Location: [facility];  Service: Gynecology;  Laterality: N/A;  . HYSTEROSCOPY N/A 08/10/2014   Procedure: HYSTEROSCOPY;  Surgeon: Lavonia Drafts, MD;  Location: [facility];  Service: Gynecology;  Laterality: N/A;    Family History  Problem Relation Age of Onset  . Alcohol abuse Neg Hx     Social History  Substance Use Topics  . Smoking status: Never Smoker  . Smokeless tobacco: Never Used  . Alcohol use No    Allergies:  Allergies  Allergen Reactions  . Okra     Hives and itching  . Ondansetron Hives and Itching  . Phenergan [Promethazine] Hives and Itching    Note: pt tolerates PO COMPAZINE  . Zofran [Ondansetron Hcl] Rash    Prescriptions Prior to Admission  Medication Sig Dispense Refill Last Dose  . acetaminophen (TYLENOL) 325 MG tablet Take 650 mg by mouth every 6 (six) hours as needed for moderate pain or fever.   Taking  . aspirin EC 81 MG tablet Take 1 tablet (81 mg total) by mouth daily. Take after 12 weeks for prevention of preeclampsia later in pregnancy 300 tablet 2   .  Doxylamine-Pyridoxine ER (BONJESTA) 20-20 MG TBCR Take 1 tablet by mouth at bedtime. Add one with breakfast if symptoms continue (Patient not taking: Reported on 03/18/2017) 90 tablet 1 Not Taking  . metoCLOPramide (REGLAN) 10 MG tablet Take 1 tablet (10 mg total) by mouth every 6 (six) hours. (Patient not taking: Reported on 03/18/2017) 30 tablet 0 Not Taking  . Prenatal Vit-Fe Fumarate-FA (PRENATAL MULTIVITAMIN) TABS tablet Take 1 tablet by mouth daily at 12 noon.   Taking   Results for orders placed or performed during the hospital encounter of 04/07/17 (from the past 48 hour(s))  Urinalysis, Routine w reflex microscopic     Status: Abnormal   Collection Time: 04/07/17  6:27 PM  Result Value Ref Range   Color, Urine STRAW (A) YELLOW   APPearance CLEAR CLEAR   Specific Gravity, Urine 1.006 1.005 - 1.030   pH 7.0 5.0 - 8.0   Glucose, UA NEGATIVE NEGATIVE mg/dL   Hgb urine dipstick NEGATIVE NEGATIVE   Bilirubin Urine NEGATIVE NEGATIVE   Ketones, ur NEGATIVE NEGATIVE mg/dL   Protein, ur NEGATIVE NEGATIVE mg/dL   Nitrite NEGATIVE NEGATIVE   Leukocytes, UA NEGATIVE NEGATIVE  CBC     Status: Abnormal   Collection Time: 04/07/17  6:38 PM  Result Value Ref Range  WBC 8.8 4.0 - 10.5 K/uL   RBC 3.03 (L) 3.87 - 5.11 MIL/uL   Hemoglobin 10.0 (L) 12.0 - 15.0 g/dL   HCT 29.3 (L) 36.0 - 46.0 %   MCV 96.7 78.0 - 100.0 fL   MCH 33.0 26.0 - 34.0 pg   MCHC 34.1 30.0 - 36.0 g/dL   RDW 13.2 11.5 - 15.5 %   Platelets 156 150 - 400 K/uL  Influenza panel by PCR (type A & B)     Status: None   Collection Time: 04/07/17  8:05 PM  Result Value Ref Range   Influenza A By PCR NEGATIVE NEGATIVE   Influenza B By PCR NEGATIVE NEGATIVE    Comment: (NOTE) The Xpert Xpress Flu assay is intended as an aid in the diagnosis of  influenza and should not be used as a sole basis for treatment.  This  assay is FDA approved for nasopharyngeal swab specimens only. Nasal  washings and aspirates are unacceptable for  Xpert Xpress Flu testing.     Review of Systems  See pertinent in HPI Physical Exam   Blood pressure 111/72, pulse 73, temperature 98.5 F (36.9 C), temperature source Oral, resp. rate 17, height 4\' 10"  (1.473 m), weight 89 lb (40.4 kg), last menstrual period 11/30/2016, SpO2 100 %.  Physical Exam GENERAL: Well-developed, well-nourished female in no acute distress.  LUNGS: Clear to auscultation bilaterally.  HEART: Regular rate and rhythm. ABDOMEN: Soft, nontender, nondistended.  PELVIC: Not indicated EXTREMITIES: No cyanosis, clubbing, or edema, 2+ distal pulses.  MAU Course None  Procedures  None  MDM  CBC Afebrile here in MAU Influenza swab negative  Strep swab pending  Assessment and Plan  26 yo G2P1001 with viral illness  - Reassurance provided - OTC meds list provided - Patient to follow up for routine prenatal care as scheduled - if symptoms worsen follow up with urgent care or OBGYN - will call the patient if strep swab is positive  - Rest, and increase fluids  - Ok to use ibuprofen sparingly until 28 weeks for aches   Peggy Constant 04/07/2017, 6:56 PM   Rasch, Artist Pais, NP 04/07/2017 9:12 PM

## 2017-04-07 NOTE — MAU Note (Signed)
Pt reports she has had a fever, nasal congestion, cough, and a sore throat x 4 days. Also reports "contractions" since this am.

## 2017-04-10 LAB — CULTURE, GROUP A STREP (THRC)

## 2017-04-17 ENCOUNTER — Ambulatory Visit (INDEPENDENT_AMBULATORY_CARE_PROVIDER_SITE_OTHER): Payer: Medicaid Other | Admitting: Certified Nurse Midwife

## 2017-04-17 VITALS — BP 110/78 | HR 71 | Wt 89.1 lb

## 2017-04-17 DIAGNOSIS — Z3482 Encounter for supervision of other normal pregnancy, second trimester: Secondary | ICD-10-CM

## 2017-04-17 DIAGNOSIS — Z3480 Encounter for supervision of other normal pregnancy, unspecified trimester: Secondary | ICD-10-CM

## 2017-04-17 DIAGNOSIS — O2610 Low weight gain in pregnancy, unspecified trimester: Secondary | ICD-10-CM | POA: Insufficient documentation

## 2017-04-17 DIAGNOSIS — O2612 Low weight gain in pregnancy, second trimester: Secondary | ICD-10-CM

## 2017-04-17 DIAGNOSIS — Z8759 Personal history of other complications of pregnancy, childbirth and the puerperium: Secondary | ICD-10-CM

## 2017-04-17 DIAGNOSIS — O99012 Anemia complicating pregnancy, second trimester: Secondary | ICD-10-CM

## 2017-04-17 DIAGNOSIS — O99019 Anemia complicating pregnancy, unspecified trimester: Secondary | ICD-10-CM

## 2017-04-17 DIAGNOSIS — D509 Iron deficiency anemia, unspecified: Secondary | ICD-10-CM | POA: Insufficient documentation

## 2017-04-17 NOTE — Patient Instructions (Signed)

## 2017-04-17 NOTE — Progress Notes (Signed)
Subjective:  Shelly Koch is a 26 y.o. G2P1001 at [redacted]w[redacted]d being seen today for ongoing prenatal care.  She is currently monitored for the following issues for this low-risk pregnancy and has Undernutrition; Anemia; History of poor fetal growth; Gestational trophoblastic neoplasm; Environmental and seasonal allergies; Underweight due to inadequate caloric intake; Supervision of other normal pregnancy, antepartum; History of gestational hypertension; and Poor weight gain of pregnancy, unspecified trimester on her problem list.  Patient reports no complaints.   .  .  Movement: Present. Denies leaking of fluid.   The following portions of the patient's history were reviewed and updated as appropriate: allergies, current medications, past family history, past medical history, past social history, past surgical history and problem list. Problem list updated.  Objective:   Vitals:   04/17/17 1621  BP: 110/78  Pulse: 71  Weight: 89 lb 1.6 oz (40.4 kg)    Fetal Status: Fetal Heart Rate (bpm): 143 Fundal Height: 17 cm Movement: Present     General:  Alert, oriented and cooperative. Patient is in no acute distress.  Skin: Skin is warm and dry. No rash noted.   Cardiovascular: Normal heart rate noted  Respiratory: Normal respiratory effort, no problems with respiration noted  Abdomen: Soft, gravid, appropriate for gestational age. Pain/Pressure: Absent     Pelvic:       Cervical exam deferred        Extremities: Normal range of motion.  Edema: None  Mental Status: Normal mood and affect. Normal behavior. Normal judgment and thought content.   Urinalysis:      Assessment and Plan:  Pregnancy: G2P1001 at [redacted]w[redacted]d  1. Supervision of other normal pregnancy, antepartum  2. Poor weight gain of pregnancy, unspecified trimester - Eating well - No vomiting - plan for growth sono in 3rd trimester - Rx for Ensure bid >[facility] obtain [facility]  3. History of gestational hypertension - hasn't started ASA  yet although Rx was sent - discussed importance of starting now and benefits  4. IDA of pregnancy - discussed increase dietary Fe - supplement if Hgb <10  Preterm labor symptoms and general obstetric precautions including but not limited to vaginal bleeding, contractions, leaking of fluid and fetal movement were reviewed in detail with the patient. Please refer to After Visit Summary for other counseling recommendations.  Return in about 4 weeks (around 05/15/2017).   Julianne Handler, CNM

## 2017-04-18 ENCOUNTER — Other Ambulatory Visit: Payer: Self-pay

## 2017-04-22 LAB — AFP TETRA
DIA MOM VALUE: 2.22
DIA VALUE (EIA): 521.29 pg/mL
DSR (BY AGE) 1 IN: 850
DSR (Second Trimester) 1 IN: 171
Gestational Age: 17 WEEKS
MSAFP Mom: 0.82
MSAFP: 44.3 ng/mL
MSHCG MOM: 1.94
MSHCG: 89439 m[IU]/mL
Maternal Age At EDD: 28.1 yr
Osb Risk: 10000
Test Results:: POSITIVE — AB
UE3 MOM: 0.87
UE3 VALUE: 1.05 ng/mL
Weight: 89 [lb_av]

## 2017-04-24 ENCOUNTER — Encounter (HOSPITAL_COMMUNITY): Payer: Self-pay | Admitting: Obstetrics and Gynecology

## 2017-04-26 ENCOUNTER — Inpatient Hospital Stay (HOSPITAL_COMMUNITY)
Admission: AD | Admit: 2017-04-26 | Discharge: 2017-04-26 | Disposition: A | Discharge status: Home / Self Care | Payer: Medicaid Other | Source: Ambulatory Visit | Attending: Obstetrics and Gynecology | Admitting: Obstetrics and Gynecology

## 2017-04-26 ENCOUNTER — Encounter (HOSPITAL_COMMUNITY): Payer: Self-pay | Admitting: *Deleted

## 2017-04-26 DIAGNOSIS — O26892 Other specified pregnancy related conditions, second trimester: Secondary | ICD-10-CM

## 2017-04-26 DIAGNOSIS — O2610 Low weight gain in pregnancy, unspecified trimester: Secondary | ICD-10-CM

## 2017-04-26 DIAGNOSIS — Z3A18 18 weeks gestation of pregnancy: Secondary | ICD-10-CM | POA: Diagnosis not present

## 2017-04-26 DIAGNOSIS — D509 Iron deficiency anemia, unspecified: Secondary | ICD-10-CM

## 2017-04-26 DIAGNOSIS — R102 Pelvic and perineal pain: Secondary | ICD-10-CM | POA: Diagnosis not present

## 2017-04-26 DIAGNOSIS — O99019 Anemia complicating pregnancy, unspecified trimester: Secondary | ICD-10-CM

## 2017-04-26 DIAGNOSIS — O26899 Other specified pregnancy related conditions, unspecified trimester: Secondary | ICD-10-CM

## 2017-04-26 LAB — URINALYSIS, ROUTINE W REFLEX MICROSCOPIC
Bilirubin Urine: NEGATIVE
GLUCOSE, UA: NEGATIVE mg/dL
Hgb urine dipstick: NEGATIVE
KETONES UR: NEGATIVE mg/dL
Nitrite: NEGATIVE
PH: 7 (ref 5.0–8.0)
PROTEIN: NEGATIVE mg/dL
Specific Gravity, Urine: 1.004 — ABNORMAL LOW (ref 1.005–1.030)

## 2017-04-26 LAB — WET PREP, GENITAL
CLUE CELLS WET PREP: NONE SEEN
Sperm: NONE SEEN
Trich, Wet Prep: NONE SEEN
Yeast Wet Prep HPF POC: NONE SEEN

## 2017-04-26 NOTE — Progress Notes (Signed)
Carlton Adam CNM notified of pt's wet prep and u/a results. Pt stable for d/c home.

## 2017-04-26 NOTE — MAU Note (Signed)
Having abd pain for 3 days in lower abd. Constant, achy pain. Urine smells strong. Has not felt baby move since morning. Denies LOF or vag bleeding

## 2017-04-26 NOTE — MAU Provider Note (Signed)
History     CSN: [id number]  Arrival date and time: 04/26/17 7782   First Provider Initiated Contact with Patient 04/26/17 [phone]      Chief Complaint  Patient presents with  . Pelvic Pain   Pelvic Pain  The patient's primary symptoms include pelvic pain. The patient's pertinent negatives include no vaginal discharge. This is a new problem. The current episode started yesterday. The problem occurs intermittently. The problem has been unchanged. Pain severity now: 6/10  The problem affects both sides. She is pregnant. Pertinent negatives include no chills, dysuria, fever, frequency, nausea, urgency or vomiting. The vaginal discharge was normal. There has been no bleeding. She has not been passing clots. She has not been passing tissue. Nothing aggravates the symptoms. She has tried acetaminophen for the symptoms. The treatment provided mild relief. Her menstrual history has been regular (LMP 11/30/16 ).   Past Medical History:  Diagnosis Date  . Anemia   . Gestational diabetes   . Gestational trophoblastic neoplasm   . Infection   . PICC (peripherally inserted central catheter) in place 12/24/2014  . Preterm labor   . Pyelonephritis     Past Surgical History:  Procedure Laterality Date  . DILATION AND EVACUATION N/A 08/10/2014   Procedure: DILATATION AND EVACUATION;  Surgeon: Lavonia Drafts, MD;  Location: [facility];  Service: Gynecology;  Laterality: N/A;  . HYSTEROSCOPY N/A 08/10/2014   Procedure: HYSTEROSCOPY;  Surgeon: Lavonia Drafts, MD;  Location: [facility];  Service: Gynecology;  Laterality: N/A;    Family History  Problem Relation Age of Onset  . Alcohol abuse Neg Hx     Social History  Substance Use Topics  . Smoking status: Never Smoker  . Smokeless tobacco: Never Used  . Alcohol use No    Allergies:  Allergies  Allergen Reactions  . Okra     Hives and itching  . Ondansetron Hives and Itching  . Phenergan [Promethazine] Hives and Itching    Note: pt  tolerates PO COMPAZINE  . Zofran [Ondansetron Hcl] Rash    No prescriptions prior to admission.    Review of Systems  Constitutional: Negative for chills and fever.  Gastrointestinal: Negative for nausea and vomiting.  Genitourinary: Positive for pelvic pain. Negative for dysuria, frequency, urgency, vaginal bleeding and vaginal discharge.   Physical Exam   Blood pressure 102/60, pulse 79, temperature 98.3 F (36.8 C), resp. rate 16, height 4\' 10"  (1.473 m), weight 89 lb (40.4 kg), last menstrual period 11/30/2016.  Physical Exam  Nursing note and vitals reviewed. Constitutional: She is oriented to person, place, and time. She appears well-developed and well-nourished. No distress.  HENT:  Head: Normocephalic.  Cardiovascular: Normal rate.   Respiratory: Effort normal.  GI: Soft. There is no tenderness. There is no rebound.  Genitourinary:  Genitourinary Comments:  External: no lesion Vagina: small amount of white discharge Cervix: pink, smooth, no CMT Uterus: AGA FHT 145 with doppler    Neurological: She is alert and oriented to person, place, and time.  Skin: Skin is warm and dry.  Psychiatric: She has a normal mood and affect.   Results for orders placed or performed during the hospital encounter of 04/26/17 (from the past 24 hour(s))  Urinalysis, Routine w reflex microscopic     Status: Abnormal   Collection Time: 04/26/17  3:00 AM  Result Value Ref Range   Color, Urine STRAW (A) YELLOW   APPearance CLEAR CLEAR   Specific Gravity, Urine 1.004 (L) 1.005 - 1.030  pH 7.0 5.0 - 8.0   Glucose, UA NEGATIVE NEGATIVE mg/dL   Hgb urine dipstick NEGATIVE NEGATIVE   Bilirubin Urine NEGATIVE NEGATIVE   Ketones, ur NEGATIVE NEGATIVE mg/dL   Protein, ur NEGATIVE NEGATIVE mg/dL   Nitrite NEGATIVE NEGATIVE   Leukocytes, UA TRACE (A) NEGATIVE   RBC / HPF 0-5 0 - 5 RBC/hpf   WBC, UA 0-5 0 - 5 WBC/hpf   Bacteria, UA RARE (A) NONE SEEN   Squamous Epithelial / LPF 0-5 (A) NONE  SEEN  Wet prep, genital     Status: Abnormal   Collection Time: 04/26/17  4:25 AM  Result Value Ref Range   Yeast Wet Prep HPF POC NONE SEEN NONE SEEN   Trich, Wet Prep NONE SEEN NONE SEEN   Clue Cells Wet Prep HPF POC NONE SEEN NONE SEEN   WBC, Wet Prep HPF POC MODERATE (A) NONE SEEN   Sperm NONE SEEN     MAU Course  Procedures  MDM   Assessment and Plan   1. Round ligament pain  2. [redacted] week gestation of pregnancy   DC home Comfort measures reviewed  2nd Trimester precautions  RX: none  Return to MAU as needed FU with OB as planned  Leith for [place] [facility] Follow up.   Specialty:  Obstetrics and Gynecology Contact information: [address] [place] 04/26/2017, 12:50 PM

## 2017-04-26 NOTE — Progress Notes (Signed)
Yes-inpt entered in error

## 2017-04-26 NOTE — Discharge Instructions (Signed)

## 2017-04-26 NOTE — Progress Notes (Signed)
Written and verbal d/c instructions given and understanding voiced. 

## 2017-04-28 ENCOUNTER — Telehealth: Payer: Self-pay | Admitting: *Deleted

## 2017-04-28 DIAGNOSIS — Z87898 Personal history of other specified conditions: Secondary | ICD-10-CM

## 2017-04-28 DIAGNOSIS — O280 Abnormal hematological finding on antenatal screening of mother: Secondary | ICD-10-CM

## 2017-04-28 DIAGNOSIS — Z3480 Encounter for supervision of other normal pregnancy, unspecified trimester: Secondary | ICD-10-CM

## 2017-04-28 DIAGNOSIS — O019 Hydatidiform mole, unspecified: Secondary | ICD-10-CM

## 2017-04-28 LAB — GC/CHLAMYDIA PROBE AMP (~~LOC~~) NOT AT ARMC
Chlamydia: NEGATIVE
Neisseria Gonorrhea: NEGATIVE

## 2017-04-28 NOTE — Telephone Encounter (Signed)
I called Shelly Koch and explained her quad screen came back abnormal and showing her has increased risk and we can offer her an appointment with a genetic counselor who will explain results, risks and reccomendations. I explained I will try to get this done on same day as her [place] this Friday 05/02/17. She voices understanding. I called MFM and scheduled to be done after [place]. I called [name] and notified her. She voices understanding.

## 2017-04-28 NOTE — Telephone Encounter (Signed)
-----   Message from Julianne Handler, [place] sent at 04/23/2017  9:35 AM EDT ----- Regarding: Quad result Abnormal quad, increased DS risk. Need to offer genetic counseling. Thanks.

## 2017-05-02 ENCOUNTER — Encounter (HOSPITAL_COMMUNITY): Payer: Self-pay

## 2017-05-02 ENCOUNTER — Ambulatory Visit (HOSPITAL_COMMUNITY)
Admission: RE | Admit: 2017-05-02 | Discharge: 2017-05-02 | Disposition: A | Discharge status: Home / Self Care | Payer: Medicaid Other | Source: Ambulatory Visit | Attending: Obstetrics and Gynecology | Admitting: Obstetrics and Gynecology

## 2017-05-02 DIAGNOSIS — Z3A19 19 weeks gestation of pregnancy: Secondary | ICD-10-CM | POA: Diagnosis not present

## 2017-05-02 DIAGNOSIS — O280 Abnormal hematological finding on antenatal screening of mother: Secondary | ICD-10-CM

## 2017-05-02 DIAGNOSIS — Z3480 Encounter for supervision of other normal pregnancy, unspecified trimester: Secondary | ICD-10-CM | POA: Insufficient documentation

## 2017-05-02 DIAGNOSIS — O2610 Low weight gain in pregnancy, unspecified trimester: Secondary | ICD-10-CM

## 2017-05-02 DIAGNOSIS — D509 Iron deficiency anemia, unspecified: Secondary | ICD-10-CM

## 2017-05-02 DIAGNOSIS — O99019 Anemia complicating pregnancy, unspecified trimester: Secondary | ICD-10-CM

## 2017-05-02 NOTE — Progress Notes (Signed)
Genetic Counseling  High-Risk Gestation Note  Appointment Date:  05/02/2017 Referred By: Denita Lung, MD Date of Birth:  Oct 03, 1990 Partner:  Shelly Koch   Pregnancy History: [id number] Estimated Date of Delivery: 09/25/17 Estimated Gestational Age: [redacted]w[redacted]d Attending: Renella Cunas, MD   [name] and her partner, [name], were seen for genetic counseling because of an increased risk for fetal Down syndrome based on Quad screening through [facility]. However, the patient previously had first trimester screening in the pregnancy, which resulted within normal range/low risk for fetal Down syndrome.   In summary:  Reviewed results of Quad screening test  Increased risk for Down syndrome (1 in 171)  Elevated DIA (2.22 MoM)- associated with increased risk for adverse pregnancy outcomes  However reviewed results of First trimester screening, which were low risk for fetal Down syndrome, Trisomy 18/13  Reviewed limitations of maternal serum screens and reviewed that First screen has higher sensitivity and specificity compared to Quad screen  Discussed additional screening options  NIPS- elected Panorama today  Ultrasound- performed today; see separate report   Discussed diagnostic testing options  Amniocentesis- declined  Reviewed family history concerns  They were counseled regarding the screening result and the associated 1 in 171 risk for fetal Down syndrome. In addition, we reviewed the screen adjusted reduction in risks for trisomy 18 and open neural tube defects.  However, we reviewed that [name] previously had first trimester screening performed, which was within normal limits for the conditions screened, resulting with less than 1 in 10,000 risk for Trisomy 44, Trisomy 18/13. We reviewed the limitations of maternal serum screening. We discussed that First screen has higher sensitivity and specificity compared to Quad screen, but that we are unable to  incorporate both screens together at this time.  We also discussed other explanations for a screen positive result including: a gestational dating error, differences in maternal metabolism, and normal variation. We specifically discussed that the level of one of the proteins analyzed on the screen, DIA, was very high (2.22 MoM).  This has been associated with an increased risk for growth restriction or poor pregnancy outcome later in pregnancy; therefore, we would recommend a follow up ultrasound for fetal growth in the third trimester.  We reviewed chromosomes, nondisjunction, and the common features and variable prognosis of Down syndrome. We reviewed other available screening options including noninvasive prenatal screening (NIPS)/cell free DNA (cfDNA) screening, and detailed ultrasound.  They were counseled that screening tests are used to modify a patient's a priori risk for aneuploidy, typically based on age. This estimate provides a pregnancy specific risk assessment. We reviewed the benefits and limitations of each option. Specifically, we discussed the conditions for which each test screens, the detection rates, and false positive rates of each. They were also counseled regarding diagnostic testing via amniocentesis. We reviewed the approximate 1 in [phone] risk for complications from amniocentesis, including spontaneous pregnancy loss. We discussed the possible results that the tests might provide including: positive, negative, unanticipated, and no result. Finally, they were counseled regarding the cost of each option and potential out of pocket expenses. After consideration of all the options, they elected to proceed with NIPS (Panorama through [facility] laboratory).  Those results will be available in 8-10 days.  They declined amniocentesis.   A complete ultrasound was performed today. The ultrasound report will be sent under separate cover. There were no visualized fetal anomalies or markers  suggestive of aneuploidy. Diagnostic testing was declined today.  They understand  that screening tests cannot rule out all birth defects or genetic syndromes. The patient was advised of this limitation and states she still does not want additional testing at this time.   [name] was provided with written information regarding cystic fibrosis (CF), spinal muscular atrophy (SMA) and hemoglobinopathies including the carrier frequency, availability of carrier screening and prenatal diagnosis if indicated.  In addition, we discussed that CF and hemoglobinopathies are routinely screened for as part of the [facility] newborn screening panel.  She previously had screening for CF, SMA and hemoglobinopathies through her OB provider, which were within normal limits.   Both family histories were reviewed and found to be noncontributory for birth defects, intellectual disability, recurrent pregnancy loss, and known genetic conditions. Without further information regarding the provided family history, an accurate genetic risk cannot be calculated. Further genetic counseling is warranted if more information is obtained.  [name] denied exposure to environmental toxins or chemical agents. She denied the use of alcohol, tobacco or street drugs. She denied significant viral illnesses during the course of her pregnancy.  I counseled this couple for approximately 40 minutes regarding the above risks and available options.   Shelly Oman, MS,  Certified Genetic Counselor 05/02/2017

## 2017-05-05 DIAGNOSIS — Z3A19 19 weeks gestation of pregnancy: Secondary | ICD-10-CM | POA: Insufficient documentation

## 2017-05-06 ENCOUNTER — Other Ambulatory Visit: Payer: Self-pay

## 2017-05-08 ENCOUNTER — Ambulatory Visit (INDEPENDENT_AMBULATORY_CARE_PROVIDER_SITE_OTHER): Payer: Medicaid Other | Admitting: Student

## 2017-05-08 VITALS — BP 105/59 | HR 92 | Wt 92.0 lb

## 2017-05-08 DIAGNOSIS — O2610 Low weight gain in pregnancy, unspecified trimester: Secondary | ICD-10-CM

## 2017-05-08 DIAGNOSIS — Z8759 Personal history of other complications of pregnancy, childbirth and the puerperium: Secondary | ICD-10-CM

## 2017-05-08 DIAGNOSIS — Z23 Encounter for immunization: Secondary | ICD-10-CM

## 2017-05-08 DIAGNOSIS — Z3482 Encounter for supervision of other normal pregnancy, second trimester: Secondary | ICD-10-CM

## 2017-05-08 DIAGNOSIS — Z3480 Encounter for supervision of other normal pregnancy, unspecified trimester: Secondary | ICD-10-CM

## 2017-05-08 DIAGNOSIS — O2612 Low weight gain in pregnancy, second trimester: Secondary | ICD-10-CM | POA: Diagnosis not present

## 2017-05-08 NOTE — Patient Instructions (Signed)
Preeclampsia and Eclampsia °Preeclampsia is a serious condition that develops only during pregnancy. It is also called toxemia of pregnancy. This condition causes high blood pressure along with other symptoms, such as swelling and headaches. These symptoms may develop as the condition gets worse. Preeclampsia may occur at 20 weeks of pregnancy or later. °Diagnosing and treating preeclampsia early is very important. If not treated early, it can cause serious problems for you and your baby. One problem it can lead to is eclampsia, which is a condition that causes muscle jerking or shaking (convulsions or seizures) in the mother. Delivering your baby is the best treatment for preeclampsia or eclampsia. Preeclampsia and eclampsia symptoms usually go away after your baby is born. °What are the causes? °The cause of preeclampsia is not known. °What increases the risk? °The following risk factors make you more likely to develop preeclampsia: °· Being pregnant for the first time. °· Having had preeclampsia during a past pregnancy. °· Having a family history of preeclampsia. °· Having high blood pressure. °· Being pregnant with twins or triplets. °· Being 35 or older. °· Being African-American. °· Having kidney disease or diabetes. °· Having medical conditions such as lupus or blood diseases. °· Being very overweight (obese). ° °What are the signs or symptoms? °The earliest signs of preeclampsia are: °· High blood pressure. °· Increased protein in your urine. Your health care provider will check for this at every visit before you give birth (prenatal visit). ° °Other symptoms that may develop as the condition gets worse include: °· Severe headaches. °· Sudden weight gain. °· Swelling of the hands, face, legs, and feet. °· Nausea and vomiting. °· Vision problems, such as blurred or double vision. °· Numbness in the face, arms, legs, and feet. °· Urinating less than usual. °· Dizziness. °· Slurred speech. °· Abdominal pain,  especially upper abdominal pain. °· Convulsions or seizures. ° °Symptoms generally go away after giving birth. °How is this diagnosed? °There are no screening tests for preeclampsia. Your health care provider will ask you about symptoms and check for signs of preeclampsia during your prenatal visits. You may also have tests that include: °· Urine tests. °· Blood tests. °· Checking your blood pressure. °· Monitoring your baby’s heart rate. °· Ultrasound. ° °How is this treated? °You and your health care provider will determine the treatment approach that is best for you. Treatment may include: °· Having more frequent prenatal exams to check for signs of preeclampsia, if you have an increased risk for preeclampsia. °· Bed rest. °· Reducing how much salt (sodium) you eat. °· Medicine to lower your blood pressure. °· Staying in the hospital, if your condition is severe. There, treatment will focus on controlling your blood pressure and the amount of fluids in your body (fluid retention). °· You may need to take medicine (magnesium sulfate) to prevent seizures. This medicine may be given as an injection or through an IV tube. °· Delivering your baby early, if your condition gets worse. You may have your labor started with medicine (induced), or you may have a cesarean delivery. ° °Follow these instructions at home: °Eating and drinking ° °· Drink enough fluid to keep your urine clear or pale yellow. °· Eat a healthy diet that is low in sodium. Do not add salt to your food. Check nutrition labels to see how much sodium a food or beverage contains. °· Avoid caffeine. °Lifestyle °· Do not use any products that contain nicotine or tobacco, such as cigarettes   and e-cigarettes. If you need help quitting, ask your health care provider. °· Do not use alcohol or drugs. °· Avoid stress as much as possible. Rest and get plenty of sleep. °General instructions °· Take over-the-counter and prescription medicines only as told by your  health care provider. °· When lying down, lie on your side. This keeps pressure off of your baby. °· When sitting or lying down, raise (elevate) your feet. Try putting some pillows underneath your lower legs. °· Exercise regularly. Ask your health care provider what kinds of exercise are best for you. °· Keep all follow-up and prenatal visits as told by your health care provider. This is important. °How is this prevented? °To prevent preeclampsia or eclampsia from developing during another pregnancy: °· Get proper medical care during pregnancy. Your health care provider may be able to prevent preeclampsia or diagnose and treat it early. °· Your health care provider may have you take a low-dose aspirin or a calcium supplement during your next pregnancy. °· You may have tests of your blood pressure and kidney function after giving birth. °· Maintain a healthy weight. Ask your health care provider for help managing weight gain during pregnancy. °· Work with your health care provider to manage any long-term (chronic) health conditions you have, such as diabetes or kidney problems. ° °Contact a health care provider if: °· You gain more weight than expected. °· You have headaches. °· You have nausea or vomiting. °· You have abdominal pain. °· You feel dizzy or light-headed. °Get help right away if: °· You develop sudden or severe swelling anywhere in your body. This usually happens in the legs. °· You gain 5 lbs (2.3 kg) or more during one week. °· You have severe: °? Abdominal pain. °? Headaches. °? Dizziness. °? Vision problems. °? Confusion. °? Nausea or vomiting. °· You have a seizure. °· You have trouble moving any part of your body. °· You develop numbness in any part of your body. °· You have trouble speaking. °· You have any abnormal bleeding. °· You pass out. °This information is not intended to replace advice given to you by your health care provider. Make sure you discuss any questions you have with your health  care provider. °Document Released: 07/05/2000 Document Revised: 03/05/2016 Document Reviewed: 02/12/2016 °Elsevier Interactive Patient Education © 2018 Elsevier Inc. ° °

## 2017-05-08 NOTE — Progress Notes (Addendum)
   PRENATAL VISIT NOTE  Subjective:  Shelly Koch is a 26 y.o. G2P1001 at [redacted]w[redacted]d being seen today for ongoing prenatal care.  She is currently monitored for the following issues for this high-risk pregnancy and has History of poor fetal growth; Gestational trophoblastic neoplasm; Environmental and seasonal allergies; Supervision of other normal pregnancy, antepartum; History of gestational hypertension; Poor weight gain of pregnancy, unspecified trimester; Iron deficiency anemia during pregnancy; and [redacted] weeks gestation of pregnancy on her problem list.  Patient reports no bleeding, no contractions, no cramping, no leaking and no nausea or vomiting.  Contractions: Not present. Vag. Bleeding: None.  Movement: Present. Denies leaking of fluid.   The following portions of the patient's history were reviewed and updated as appropriate: allergies, current medications, past family history, past medical history, past social history, past surgical history and problem list. Problem list updated.  Objective:   Vitals:   05/08/17 1240  BP: (!) 105/59  Pulse: 92  Weight: 92 lb (41.7 kg)    Fetal Status: Fetal Heart Rate (bpm): 147 Fundal Height: 18 cm Movement: Present     General:  Alert, oriented and cooperative. Patient is in no acute distress.  Skin: Skin is warm and dry. No rash noted.   Cardiovascular: Normal heart rate noted  Respiratory: Normal respiratory effort, no problems with respiration noted  Abdomen: Soft, gravid, appropriate for gestational age.  Pain/Pressure: Present     Pelvic: Cervical exam deferred        Extremities: Normal range of motion.  Edema: None  Mental Status:  Normal mood and affect. Normal behavior. Normal judgment and thought content.   Assessment and Plan:  Pregnancy: G2P1001 at [redacted]w[redacted]d  1. Supervision of other normal pregnancy, antepartum - Patient doing well, no complaints - Flu Vaccine QUAD 36+ mos IM (Fluarix, Quad PF)  2. Poor weight gain of pregnancy,  unspecified trimester - Patient states she is eating well and has no nausea or vomiting  3. History of gestational hypertension - BP 105/59 mmHg today -Patient does not want to start ASA but will reconsider in 4 weeks. Reviewed with patient the importance of preventing pre-eclampsia.   Preterm labor symptoms and general obstetric precautions including but not limited to vaginal bleeding, contractions, leaking of fluid and fetal movement were reviewed in detail with the patient. Please refer to After Visit Summary for other counseling recommendations.  Return in 4 weeks (around 06/05/17) for ROB.  Rodell Perna, Student-PA 05/08/17  I confirm that I have verified the information documented in the physician assistant's note and that I have also personally reperformed the physical exam and all medical decision making activities.  Maye Hides CNM

## 2017-05-09 ENCOUNTER — Other Ambulatory Visit (HOSPITAL_COMMUNITY): Payer: Self-pay | Admitting: *Deleted

## 2017-05-09 DIAGNOSIS — R899 Unspecified abnormal finding in specimens from other organs, systems and tissues: Secondary | ICD-10-CM

## 2017-05-12 ENCOUNTER — Encounter: Payer: Self-pay | Admitting: Obstetrics and Gynecology

## 2017-05-13 ENCOUNTER — Telehealth (HOSPITAL_COMMUNITY): Payer: Self-pay | Admitting: MS"

## 2017-05-13 NOTE — Telephone Encounter (Signed)
Called Shelly Koch to discuss her prenatal cell free DNA test results.  Shelly Koch had Panorama testing through [facility].  Testing was offered because of abnormal maternal serum screening.   The patient was identified by name and DOB.  We reviewed that these are within normal limits, showing a less than 1 in 10,000 risk for trisomies 21, 18 and 13, and monosomy X (Turner syndrome).  In addition, the risk for triploidy and sex chromosome trisomies (47,XXX and 47,XXY) was also low risk.  We reviewed that this testing identifies > 99% of pregnancies with trisomy 55, trisomy 75, sex chromosome trisomies (47,XXX and 47,XXY), and triploidy. The detection rate for trisomy 18 is 96%.  The detection rate for monosomy X is ~92%.  The false positive rate is <0.1% for all conditions. Testing was also consistent with female fetal sex.  She understands that this testing does not identify all genetic conditions.  All questions were answered to her satisfaction, she was encouraged to call with additional questions or concerns.  Chipper Oman, MS Insurance risk surveyor

## 2017-05-15 ENCOUNTER — Other Ambulatory Visit: Payer: Self-pay

## 2017-06-17 ENCOUNTER — Ambulatory Visit (INDEPENDENT_AMBULATORY_CARE_PROVIDER_SITE_OTHER): Payer: Medicaid Other | Admitting: Medical

## 2017-06-17 ENCOUNTER — Encounter: Payer: Medicaid Other | Admitting: Medical

## 2017-06-17 VITALS — BP 112/69 | HR 80 | Wt 101.2 lb

## 2017-06-17 DIAGNOSIS — Z3480 Encounter for supervision of other normal pregnancy, unspecified trimester: Secondary | ICD-10-CM

## 2017-06-17 MED ORDER — POLYETHYLENE GLYCOL 3350 17 G PO PACK: 17.0000 g | PACK | Freq: Every day | ORAL | 0 refills | Status: DC

## 2017-06-17 NOTE — Patient Instructions (Signed)
Second Trimester of Pregnancy The second trimester is from week 13 through week 28, month 4 through 6. This is often the time in pregnancy that you feel your best. Often times, morning sickness has lessened or quit. You may have more energy, and you may get hungry more often. Your unborn baby (fetus) is growing rapidly. At the end of the sixth month, he or she is about 9 inches long and weighs about 1 pounds. You will likely feel the baby move (quickening) between 18 and 20 weeks of pregnancy. Follow these instructions at home:  Avoid all smoking, herbs, and alcohol. Avoid drugs not approved by your doctor.  Do not use any tobacco products, including cigarettes, chewing tobacco, and electronic cigarettes. If you need help quitting, ask your doctor. You may get counseling or other support to help you quit.  Only take medicine as told by your doctor. Some medicines are safe and some are not during pregnancy.  Exercise only as told by your doctor. Stop exercising if you start having cramps.  Eat regular, healthy meals.  Wear a good support bra if your breasts are tender.  Do not use hot tubs, steam rooms, or saunas.  Wear your seat belt when driving.  Avoid raw meat, uncooked cheese, and liter boxes and soil used by cats.  Take your prenatal vitamins.  Take 1500-2000 milligrams of calcium daily starting at the 20th week of pregnancy until you deliver your baby.  Try taking medicine that helps you poop (stool softener) as needed, and if your doctor approves. Eat more fiber by eating fresh fruit, vegetables, and whole grains. Drink enough fluids to keep your pee (urine) clear or pale yellow.  Take warm water baths (sitz baths) to soothe pain or discomfort caused by hemorrhoids. Use hemorrhoid cream if your doctor approves.  If you have puffy, bulging veins (varicose veins), wear support hose. Raise (elevate) your feet for 15 minutes, 3-4 times a day. Limit salt in your diet.  Avoid heavy  lifting, wear low heals, and sit up straight.  Rest with your legs raised if you have leg cramps or low back pain.  Visit your dentist if you have not gone during your pregnancy. Use a soft toothbrush to brush your teeth. Be gentle when you floss.  You can have sex (intercourse) unless your doctor tells you not to.  Go to your doctor visits. Get help if:  You feel dizzy.  You have mild cramps or pressure in your lower belly (abdomen).  You have a nagging pain in your belly area.  You continue to feel sick to your stomach (nauseous), throw up (vomit), or have watery poop (diarrhea).  You have bad smelling fluid coming from your vagina.  You have pain with peeing (urination). Get help right away if:  You have a fever.  You are leaking fluid from your vagina.  You have spotting or bleeding from your vagina.  You have severe belly cramping or pain.  You lose or gain weight rapidly.  You have trouble catching your breath and have chest pain.  You notice sudden or extreme puffiness (swelling) of your face, hands, ankles, feet, or legs.  You have not felt the baby move in over an hour.  You have severe headaches that do not go away with medicine.  You have vision changes. This information is not intended to replace advice given to you by your health care provider. Make sure you discuss any questions you have with your health care   provider. Document Released: 10/02/2009 Document Revised: 12/14/2015 Document Reviewed: 09/08/2012 Elsevier Interactive Patient Education  2017 Elsevier Inc.  

## 2017-06-17 NOTE — Progress Notes (Signed)
   PRENATAL VISIT NOTE  Subjective:  Shelly Koch is a 26 y.o. G2P1001 at [redacted]w[redacted]d being seen today for ongoing prenatal care.  She is currently monitored for the following issues for this high-risk pregnancy and has Elevated DSR; History of poor fetal growth; Gestational trophoblastic neoplasm; Environmental and seasonal allergies; Supervision of other normal pregnancy, antepartum; History of gestational hypertension; Poor weight gain of pregnancy, unspecified trimester; and Iron deficiency anemia during pregnancy on their problem list.  Patient reports no complaints.  Contractions: Irritability. Vag. Bleeding: None.  Movement: Present. Denies leaking of fluid.   The following portions of the patient's history were reviewed and updated as appropriate: allergies, current medications, past family history, past medical history, past social history, past surgical history and problem list. Problem list updated.  Objective:   Vitals:   06/17/17 1422  BP: 112/69  Pulse: 80  Weight: 101 lb 3.2 oz (45.9 kg)    Fetal Status: Fetal Heart Rate (bpm): 145   Movement: Present   Fundal Height 23cm   General:  Alert, oriented and cooperative. Patient is in no acute distress.  Skin: Skin is warm and dry. No rash noted.   Cardiovascular: Normal heart rate noted  Respiratory: Normal respiratory effort, no problems with respiration noted  Abdomen: Soft, gravid, appropriate for gestational age.  Pain/Pressure: Present     Pelvic: Cervical exam deferred        Extremities: Normal range of motion.  Edema: None  Mental Status:  Normal mood and affect. Normal behavior. Normal judgment and thought content.   Assessment and Plan:  Pregnancy: G2P1001 at [redacted]w[redacted]d  1. Supervision of other normal pregnancy, antepartum -Patient doing well, no current complaints  -Received Flu shot on 10/18  2. Poor weight gain of pregnancy -Patient still states she is eating well and denies any nausea or vomiting   3. History of  Gestational Hypertension -Patient still does not want to start ASA -BP today was 112/69   Preterm labor symptoms and general obstetric precautions including but not limited to vaginal bleeding, contractions, leaking of fluid and fetal movement were reviewed in detail with the patient. Please refer to After Visit Summary for other counseling recommendations.  Return in about 3 weeks (around 07/08/2017) for LOB, 28 week labs (fasting).   Franchesca Veneziano, Student-PA

## 2017-06-19 ENCOUNTER — Encounter: Payer: Self-pay | Admitting: Family Medicine

## 2017-06-20 ENCOUNTER — Encounter (HOSPITAL_COMMUNITY): Payer: Self-pay

## 2017-06-20 ENCOUNTER — Ambulatory Visit (HOSPITAL_COMMUNITY)
Admission: RE | Admit: 2017-06-20 | Discharge: 2017-06-20 | Disposition: A | Discharge status: Home / Self Care | Payer: Medicaid Other | Source: Ambulatory Visit | Attending: Medical | Admitting: Medical

## 2017-06-20 DIAGNOSIS — R899 Unspecified abnormal finding in specimens from other organs, systems and tissues: Secondary | ICD-10-CM

## 2017-06-20 DIAGNOSIS — O281 Abnormal biochemical finding on antenatal screening of mother: Secondary | ICD-10-CM | POA: Insufficient documentation

## 2017-06-20 DIAGNOSIS — O2610 Low weight gain in pregnancy, unspecified trimester: Secondary | ICD-10-CM

## 2017-06-20 DIAGNOSIS — Z3A26 26 weeks gestation of pregnancy: Secondary | ICD-10-CM | POA: Insufficient documentation

## 2017-06-20 DIAGNOSIS — D509 Iron deficiency anemia, unspecified: Secondary | ICD-10-CM

## 2017-06-20 DIAGNOSIS — O09892 Supervision of other high risk pregnancies, second trimester: Secondary | ICD-10-CM | POA: Diagnosis not present

## 2017-06-20 DIAGNOSIS — O99019 Anemia complicating pregnancy, unspecified trimester: Secondary | ICD-10-CM

## 2017-06-23 ENCOUNTER — Other Ambulatory Visit (HOSPITAL_COMMUNITY): Payer: Self-pay | Admitting: *Deleted

## 2017-06-23 DIAGNOSIS — O09899 Supervision of other high risk pregnancies, unspecified trimester: Secondary | ICD-10-CM

## 2017-06-23 DIAGNOSIS — O288 Other abnormal findings on antenatal screening of mother: Principal | ICD-10-CM

## 2017-07-08 ENCOUNTER — Inpatient Hospital Stay (HOSPITAL_COMMUNITY)
Admission: AD | Admit: 2017-07-08 | Discharge: 2017-07-08 | Disposition: A | Discharge status: Home / Self Care | Payer: Medicaid Other | Source: Ambulatory Visit | Attending: Obstetrics and Gynecology | Admitting: Obstetrics and Gynecology

## 2017-07-08 ENCOUNTER — Encounter (HOSPITAL_COMMUNITY): Payer: Self-pay

## 2017-07-08 DIAGNOSIS — R109 Unspecified abdominal pain: Secondary | ICD-10-CM | POA: Insufficient documentation

## 2017-07-08 DIAGNOSIS — O26893 Other specified pregnancy related conditions, third trimester: Secondary | ICD-10-CM

## 2017-07-08 DIAGNOSIS — Z7982 Long term (current) use of aspirin: Secondary | ICD-10-CM | POA: Diagnosis not present

## 2017-07-08 DIAGNOSIS — M7918 Myalgia, other site: Secondary | ICD-10-CM | POA: Diagnosis not present

## 2017-07-08 DIAGNOSIS — Z3689 Encounter for other specified antenatal screening: Secondary | ICD-10-CM

## 2017-07-08 DIAGNOSIS — R102 Pelvic and perineal pain: Secondary | ICD-10-CM

## 2017-07-08 DIAGNOSIS — Z3A28 28 weeks gestation of pregnancy: Secondary | ICD-10-CM

## 2017-07-08 DIAGNOSIS — O26899 Other specified pregnancy related conditions, unspecified trimester: Secondary | ICD-10-CM

## 2017-07-08 LAB — URINALYSIS, ROUTINE W REFLEX MICROSCOPIC
BILIRUBIN URINE: NEGATIVE
Glucose, UA: NEGATIVE mg/dL
HGB URINE DIPSTICK: NEGATIVE
KETONES UR: NEGATIVE mg/dL
Leukocytes, UA: NEGATIVE
NITRITE: NEGATIVE
PH: 8 (ref 5.0–8.0)
Protein, ur: NEGATIVE mg/dL
Specific Gravity, Urine: 1.014 (ref 1.005–1.030)

## 2017-07-08 LAB — WET PREP, GENITAL
Clue Cells Wet Prep HPF POC: NONE SEEN
Sperm: NONE SEEN
TRICH WET PREP: NONE SEEN
Yeast Wet Prep HPF POC: NONE SEEN

## 2017-07-08 MED ORDER — COMFORT FIT MATERNITY SUPP SM MISC: 1.0000 | Freq: Every day | 0 refills | Status: DC | PRN

## 2017-07-08 MED ORDER — CYCLOBENZAPRINE HCL 10 MG PO TABS: 10.0000 mg | ORAL_TABLET | Freq: Three times a day (TID) | ORAL | 0 refills | Status: DC | PRN

## 2017-07-08 MED ORDER — CYCLOBENZAPRINE HCL 10 MG PO TABS
10.0000 mg | ORAL_TABLET | Freq: Three times a day (TID) | ORAL | Status: DC | PRN
  Filled 2017-07-08: qty 1

## 2017-07-08 NOTE — MAU Note (Addendum)
Reports she is unable to hold things in her hand normally for a couple day, back pain, having trouble sleeping. Pain in pelvis also- sharp and shooting and cramping. No vaginal bleeding, no LOF. States she has a few contractions every now and then.

## 2017-07-08 NOTE — MAU Provider Note (Signed)
History     CSN: [id number]  Arrival date and time: 07/08/17 1412   First Provider Initiated Contact with Patient 07/08/17 1445      Chief Complaint  Patient presents with  . Hand numbness  . Back Pain  . Pelvic Pain   G2P1001 @28 .5 wks here with back pain, neck pain, and abdominal pain. Abdominal pain started 2-3 days ago. She describes as constant and in both sides of lower abdomen. Pain is worse with fetal movement and upright positions. She tried Tylenol but did not help. Denies VB or LOF. Good FM. Report urinary urgency over last week. No dysuria or hematuria. Back pain started 2-3 days ago. Pain is near right shoulder blade and radiates into back of neck. Denies any lifting, reaching, or injury. Also reports intermittent bilateral hand numbness for some time, worse with lies on her sides.    OB History    Gravida Para Term Preterm AB Living   2 1 1     1    SAB TAB Ectopic Multiple Live Births         0 1      Past Medical History:  Diagnosis Date  . Anemia   . Gestational diabetes   . Gestational trophoblastic neoplasm   . Infection   . PICC (peripherally inserted central catheter) in place 12/24/2014  . Preterm labor   . Pyelonephritis     Past Surgical History:  Procedure Laterality Date  . DILATION AND EVACUATION N/A 08/10/2014   Procedure: DILATATION AND EVACUATION;  Surgeon: Lavonia Drafts, MD;  Location: [facility];  Service: Gynecology;  Laterality: N/A;  . HYSTEROSCOPY N/A 08/10/2014   Procedure: HYSTEROSCOPY;  Surgeon: Lavonia Drafts, MD;  Location: [facility];  Service: Gynecology;  Laterality: N/A;    Family History  Problem Relation Age of Onset  . Alcohol abuse Neg Hx     Social History   Tobacco Use  . Smoking status: Never Smoker  . Smokeless tobacco: Never Used  Substance Use Topics  . Alcohol use: No  . Drug use: No    Allergies:  Allergies  Allergen Reactions  . Okra     Hives and itching  . Ondansetron Hives and Itching    . Phenergan [Promethazine] Hives and Itching    Note: pt tolerates PO COMPAZINE  . Zofran [Ondansetron Hcl] Rash    Medications Prior to Admission  Medication Sig Dispense Refill Last Dose  . Prenatal Vit-Fe Fumarate-FA (PRENATAL MULTIVITAMIN) TABS tablet Take 1 tablet by mouth daily at 12 noon.   07/08/2017 at Unknown time  . aspirin EC 81 MG tablet Take 1 tablet (81 mg total) by mouth daily. Take after 12 weeks for prevention of preeclampsia later in pregnancy (Patient not taking: Reported on 05/02/2017) 300 tablet 2 Not Taking  . polyethylene glycol (MIRALAX / GLYCOLAX) packet Take 17 g by mouth daily. (Patient not taking: Reported on 07/08/2017) 14 each 0 Not Taking at Unknown time    Review of Systems  Constitutional: Negative for chills and fever.  Gastrointestinal: Positive for abdominal pain. Negative for constipation and diarrhea.  Genitourinary: Positive for urgency. Negative for dysuria, hematuria and vaginal bleeding.  Neurological: Positive for numbness.   Physical Exam   Blood pressure 103/65, pulse 97, temperature 98.5 F (36.9 C), temperature source Oral, resp. rate 16, last menstrual period 11/30/2016.  Physical Exam  Nursing note and vitals reviewed. Constitutional: She is oriented to person, place, and time. She appears well-developed and well-nourished.  HENT:  Head: Normocephalic and atraumatic.    Eyes: Pupils are equal, round, and reactive to light.  Neck: Normal range of motion.  Respiratory: Effort normal. No respiratory distress.  GI: Soft. She exhibits no distension. There is no tenderness.  gravid  Genitourinary:  Genitourinary Comments: Cervix closed/thick  Musculoskeletal:       Cervical back: She exhibits tenderness.       Back:  Neurological: She is alert and oriented to person, place, and time. She has normal strength. No cranial nerve deficit.  Skin: Skin is warm and dry.  Psychiatric: She has a normal mood and affect.  EFM: 140 bpm, mod  variability, + accels, no decels Toco: occ. irritability  Results for orders placed or performed during the hospital encounter of 07/08/17 (from the past 24 hour(s))  Urinalysis, Routine w reflex microscopic     Status: Abnormal   Collection Time: 07/08/17  2:20 PM  Result Value Ref Range   Color, Urine YELLOW YELLOW   APPearance CLOUDY (A) CLEAR   Specific Gravity, Urine 1.014 1.005 - 1.030   pH 8.0 5.0 - 8.0   Glucose, UA NEGATIVE NEGATIVE mg/dL   Hgb urine dipstick NEGATIVE NEGATIVE   Bilirubin Urine NEGATIVE NEGATIVE   Ketones, ur NEGATIVE NEGATIVE mg/dL   Protein, ur NEGATIVE NEGATIVE mg/dL   Nitrite NEGATIVE NEGATIVE   Leukocytes, UA NEGATIVE NEGATIVE  Wet prep, genital     Status: Abnormal   Collection Time: 07/08/17  2:20 PM  Result Value Ref Range   Yeast Wet Prep HPF POC NONE SEEN NONE SEEN   Trich, Wet Prep NONE SEEN NONE SEEN   Clue Cells Wet Prep HPF POC NONE SEEN NONE SEEN   WBC, Wet Prep HPF POC FEW (A) NONE SEEN   Sperm NONE SEEN     MAU Course  Procedures  MDM Labs ordered and reviewed. No evidence of UTI or PTL. Abd pain likely pelvic nerve compression from advancing pregnancy and/or RL pain. Shoulder and neck pain likely MSK, recommend Flexeril and heat- pt declines Flexeril here, will provide Rx. Normal neuro exam, numbness likely physioloic to pregnancy. Stable for discharge home.  Assessment and Plan   1. [redacted] weeks gestation of pregnancy   2. NST (non-stress test) reactive   3. Musculoskeletal pain   4. Pain of round ligament during pregnancy    Discharge home Follow up in OB office as scheduled PTL precautions Heating pad/rice pack prn Rx Flexeril Rx maternity belt  Allergies as of 07/08/2017      Reactions   Okra    Hives and itching   Ondansetron Hives, Itching   Phenergan [promethazine] Hives, Itching   Note: pt tolerates PO COMPAZINE   Zofran [ondansetron Hcl] Rash      Medication List    TAKE these medications   aspirin EC 81 MG  tablet Take 1 tablet (81 mg total) by mouth daily. Take after 12 weeks for prevention of preeclampsia later in pregnancy   COMFORT FIT MATERNITY SUPP SM Misc 1 Device by Does not apply route daily as needed.   cyclobenzaprine 10 MG tablet Commonly known as:  FLEXERIL Take 1 tablet (10 mg total) by mouth 3 (three) times daily as needed for muscle spasms.   polyethylene glycol packet Commonly known as:  MIRALAX / GLYCOLAX Take 17 g by mouth daily.   prenatal multivitamin Tabs tablet Take 1 tablet by mouth daily at 12 noon.      Julianne Handler, CNM 07/08/2017, 3:57 PM

## 2017-07-08 NOTE — Discharge Instructions (Signed)
Musculoskeletal Pain Musculoskeletal pain is muscle and bone aches and pains. This pain can occur in any part of the body. Follow these instructions at home:  Only take medicines for pain, discomfort, or fever as told by your health care provider.  You may continue all activities unless the activities cause more pain. When the pain lessens, slowly resume normal activities. Gradually increase the intensity and duration of the activities or exercise.  During periods of severe pain, bed rest may be helpful. Lie or sit in any position that is comfortable, but get out of bed and walk around at least every several hours.  If directed, put ice on the injured area. ? Put ice in a plastic bag. ? Place a towel between your skin and the bag. ? Leave the ice on for 20 minutes, 2-3 times a day. Contact a health care provider if:  Your pain is getting worse.  Your pain is not relieved with medicines.  You lose function in the area of the pain if the pain is in your arms, legs, or neck. This information is not intended to replace advice given to you by your health care provider. Make sure you discuss any questions you have with your health care provider. Document Released: 07/08/2005 Document Revised: 12/19/2015 Document Reviewed: 03/12/2013 Elsevier Interactive Patient Education  2017 [employer]. Round Ligament Pain The round ligament is a cord of muscle and tissue that helps to support the uterus. It can become a source of pain during pregnancy if it becomes stretched or twisted as the baby grows. The pain usually begins in the second trimester of pregnancy, and it can come and go until the baby is delivered. It is not a serious problem, and it does not cause harm to the baby. Round ligament pain is usually a short, sharp, and pinching pain, but it can also be a dull, lingering, and aching pain. The pain is felt in the lower side of the abdomen or in the groin. It usually starts deep in the groin and  moves up to the outside of the hip area. Pain can occur with:  A sudden change in position.  Rolling over in bed.  Coughing or sneezing.  Physical activity.  Follow these instructions at home: Watch your condition for any changes. Take these steps to help with your pain:  When the pain starts, relax. Then try: ? Sitting down. ? Flexing your knees up to your abdomen. ? Lying on your side with one pillow under your abdomen and another pillow between your legs. ? Sitting in a warm bath for 15-20 minutes or until the pain goes away.  Take over-the-counter and prescription medicines only as told by your health care provider.  Move slowly when you sit and stand.  Avoid long walks if they cause pain.  Stop or lessen your physical activities if they cause pain.  Contact a health care provider if:  Your pain does not go away with treatment.  You feel pain in your back that you did not have before.  Your medicine is not helping. Get help right away if:  You develop a fever or chills.  You develop uterine contractions.  You develop vaginal bleeding.  You develop nausea or vomiting.  You develop diarrhea.  You have pain when you urinate. This information is not intended to replace advice given to you by your health care provider. Make sure you discuss any questions you have with your health care provider. Document Released: 04/16/2008 Document  Revised: 12/14/2015 Document Reviewed: 09/14/2014 Elsevier Interactive Patient Education  [employer].

## 2017-07-09 ENCOUNTER — Encounter: Payer: Medicaid Other | Admitting: Family Medicine

## 2017-07-09 LAB — GC/CHLAMYDIA PROBE AMP (~~LOC~~) NOT AT ARMC
Chlamydia: NEGATIVE
Neisseria Gonorrhea: NEGATIVE

## 2017-07-11 ENCOUNTER — Ambulatory Visit (INDEPENDENT_AMBULATORY_CARE_PROVIDER_SITE_OTHER): Payer: Medicaid Other | Admitting: Obstetrics and Gynecology

## 2017-07-11 VITALS — BP 103/70 | HR 95 | Wt 105.0 lb

## 2017-07-11 DIAGNOSIS — D509 Iron deficiency anemia, unspecified: Secondary | ICD-10-CM

## 2017-07-11 DIAGNOSIS — Z3480 Encounter for supervision of other normal pregnancy, unspecified trimester: Secondary | ICD-10-CM

## 2017-07-11 DIAGNOSIS — Z8759 Personal history of other complications of pregnancy, childbirth and the puerperium: Secondary | ICD-10-CM

## 2017-07-11 DIAGNOSIS — O2610 Low weight gain in pregnancy, unspecified trimester: Secondary | ICD-10-CM

## 2017-07-11 DIAGNOSIS — O99019 Anemia complicating pregnancy, unspecified trimester: Secondary | ICD-10-CM

## 2017-07-11 DIAGNOSIS — Z87898 Personal history of other specified conditions: Secondary | ICD-10-CM

## 2017-07-11 NOTE — Progress Notes (Signed)
   PRENATAL VISIT NOTE  Subjective:  Lamanda Rudder is a 26 y.o. G2P1001 at [redacted]w[redacted]d being seen today for ongoing prenatal care.  She is currently monitored for the following issues for this high-risk pregnancy and has Elevated DSR; History of poor fetal growth; Gestational trophoblastic neoplasm; Environmental and seasonal allergies; Supervision of high risk pregnancy, antepartum; History of gestational hypertension; Poor weight gain of pregnancy, unspecified trimester; and Iron deficiency anemia during pregnancy on their problem list.  Patient reports no complaints.  Contractions: Irritability. Vag. Bleeding: None.  Movement: Present. Denies leaking of fluid.   The following portions of the patient's history were reviewed and updated as appropriate: allergies, current medications, past family history, past medical history, past social history, past surgical history and problem list. Problem list updated.  Objective:   Vitals:   07/11/17 0852  BP: 103/70  Pulse: 95  Weight: 105 lb (47.6 kg)    Fetal Status: Fetal Heart Rate (bpm): 157 Fundal Height: 28 cm Movement: Present     General:  Alert, oriented and cooperative. Patient is in no acute distress.  Skin: Skin is warm and dry. No rash noted.   Cardiovascular: Normal heart rate noted  Respiratory: Normal respiratory effort, no problems with respiration noted  Abdomen: Soft, gravid, appropriate for gestational age.  Pain/Pressure: Present     Pelvic: Cervical exam deferred        Extremities: Normal range of motion.  Edema: None  Mental Status:  Normal mood and affect. Normal behavior. Normal judgment and thought content.   Assessment and Plan:  Pregnancy: G2P1001 at [redacted]w[redacted]d  1. Supervision of other normal pregnancy, antepartum - Glucose Tolerance, 2 Hours w/1 Hour - CBC - RPR - HIV antibody  2. History of poor fetal growth  Growth [place] on 12/28 scheduled   3. History of gestational hypertension  Declines ASA again- does not  want to take it  4. Poor weight gain of pregnancy, unspecified trimester  Has gained 13 lbs  Continue Ensure and whole milk, RX given  Discussed high caloric diet  5. Iron deficiency anemia during pregnancy  CBC today. If less than 10 will initiate iron supplement   Term labor symptoms and general obstetric precautions including but not limited to vaginal bleeding, contractions, leaking of fluid and fetal movement were reviewed in detail with the patient. Please refer to After Visit Summary for other counseling recommendations.  Return in about 2 weeks (around 07/25/2017), or High Risk pregnancy .   Noni Saupe, NP

## 2017-07-12 LAB — GLUCOSE TOLERANCE, 2 HOURS W/ 1HR
GLUCOSE, 2 HOUR: 119 mg/dL (ref 65–152)
Glucose, 1 hour: 131 mg/dL (ref 65–179)
Glucose, Fasting: 70 mg/dL (ref 65–91)

## 2017-07-12 LAB — CBC
Hematocrit: 33.1 % — ABNORMAL LOW (ref 34.0–46.6)
Hemoglobin: 10.5 g/dL — ABNORMAL LOW (ref 11.1–15.9)
MCH: 32.7 pg (ref 26.6–33.0)
MCHC: 31.7 g/dL (ref 31.5–35.7)
MCV: 103 fL — AB (ref 79–97)
PLATELETS: 157 10*3/uL (ref 150–379)
RBC: 3.21 x10E6/uL — ABNORMAL LOW (ref 3.77–5.28)
RDW: 13.4 % (ref 12.3–15.4)
WBC: 9 10*3/uL (ref 3.4–10.8)

## 2017-07-12 LAB — RPR: RPR Ser Ql: NONREACTIVE

## 2017-07-12 LAB — HIV ANTIBODY (ROUTINE TESTING W REFLEX): HIV Screen 4th Generation wRfx: NONREACTIVE

## 2017-07-18 ENCOUNTER — Ambulatory Visit (HOSPITAL_COMMUNITY)
Admission: RE | Admit: 2017-07-18 | Discharge: 2017-07-18 | Disposition: A | Discharge status: Home / Self Care | Payer: Medicaid Other | Source: Ambulatory Visit | Attending: Medical | Admitting: Medical

## 2017-07-18 ENCOUNTER — Encounter (HOSPITAL_COMMUNITY): Payer: Self-pay

## 2017-07-18 DIAGNOSIS — O288 Other abnormal findings on antenatal screening of mother: Secondary | ICD-10-CM | POA: Insufficient documentation

## 2017-07-18 DIAGNOSIS — O09893 Supervision of other high risk pregnancies, third trimester: Secondary | ICD-10-CM | POA: Diagnosis not present

## 2017-07-18 DIAGNOSIS — O09899 Supervision of other high risk pregnancies, unspecified trimester: Secondary | ICD-10-CM

## 2017-07-18 DIAGNOSIS — Z3A30 30 weeks gestation of pregnancy: Secondary | ICD-10-CM | POA: Diagnosis not present

## 2017-07-18 DIAGNOSIS — D509 Iron deficiency anemia, unspecified: Secondary | ICD-10-CM

## 2017-07-18 DIAGNOSIS — O99019 Anemia complicating pregnancy, unspecified trimester: Secondary | ICD-10-CM

## 2017-07-18 DIAGNOSIS — O0990 Supervision of high risk pregnancy, unspecified, unspecified trimester: Secondary | ICD-10-CM

## 2017-07-18 DIAGNOSIS — O2610 Low weight gain in pregnancy, unspecified trimester: Secondary | ICD-10-CM

## 2017-07-18 NOTE — Addendum Note (Signed)
Encounter addended by: Hilda Lias, RT on: 07/18/2017 4:18 PM  Actions taken: Imaging Exam ended

## 2017-07-21 ENCOUNTER — Other Ambulatory Visit (HOSPITAL_COMMUNITY): Payer: Self-pay | Admitting: *Deleted

## 2017-07-21 DIAGNOSIS — O288 Other abnormal findings on antenatal screening of mother: Principal | ICD-10-CM

## 2017-07-21 DIAGNOSIS — O09899 Supervision of other high risk pregnancies, unspecified trimester: Secondary | ICD-10-CM

## 2017-07-22 NOTE — L&D Delivery Note (Addendum)
Delivery Note At 8:50 AM a viable female was delivered via Vaginal, Spontaneous (Presentation: ROA).  APGAR: 9, 9; weight pending.   Placenta status: manual removal by Dr. Manus Rudd and Dr. Ernestina Patches.  Cord: 3 vessels with the following complications: avulsion at time of placenta delivery.  Anesthesia:  Lidocaine, nitrous oxide Episiotomy: None Lacerations: 2nd degree Suture Repair: 3.0 vicryl  Est. Blood Loss (mL):  200  Mom to postpartum.  Baby to Couplet care / Skin to Skin.  Wende Mott CNM 09/17/2017, 9:54 AM    I was called to the room and evaluated the placenta that was manually removed by Dr. Manus Rudd. There was a missing section/lobe. The patient had received Fentanyl and we ordered nitrous oxide for analgesia. I manually removed an adherent lobe on the posterior aspect of the uterus. I felt appropriate tissue planes and bleeding was minimal. Placenta was sent to pathology given history of gestational trophoblastic disease   Caren Macadam, MD, MPH, ABFM Attending [place] for [facility]

## 2017-07-28 ENCOUNTER — Ambulatory Visit (INDEPENDENT_AMBULATORY_CARE_PROVIDER_SITE_OTHER): Payer: Medicaid Other | Admitting: Obstetrics and Gynecology

## 2017-07-28 ENCOUNTER — Encounter: Payer: Self-pay | Admitting: Obstetrics and Gynecology

## 2017-07-28 VITALS — BP 112/70 | HR 92 | Wt 105.0 lb

## 2017-07-28 DIAGNOSIS — D509 Iron deficiency anemia, unspecified: Secondary | ICD-10-CM

## 2017-07-28 DIAGNOSIS — O0990 Supervision of high risk pregnancy, unspecified, unspecified trimester: Secondary | ICD-10-CM

## 2017-07-28 DIAGNOSIS — O019 Hydatidiform mole, unspecified: Secondary | ICD-10-CM

## 2017-07-28 DIAGNOSIS — Z8759 Personal history of other complications of pregnancy, childbirth and the puerperium: Secondary | ICD-10-CM

## 2017-07-28 DIAGNOSIS — O99019 Anemia complicating pregnancy, unspecified trimester: Secondary | ICD-10-CM

## 2017-07-28 DIAGNOSIS — Z87898 Personal history of other specified conditions: Secondary | ICD-10-CM

## 2017-07-28 DIAGNOSIS — O2610 Low weight gain in pregnancy, unspecified trimester: Secondary | ICD-10-CM

## 2017-07-28 DIAGNOSIS — O280 Abnormal hematological finding on antenatal screening of mother: Secondary | ICD-10-CM

## 2017-07-28 MED ORDER — PRENATAL MULTIVITAMIN CH: 1.0000 | ORAL_TABLET | Freq: Every day | ORAL | 11 refills | Status: DC

## 2017-07-28 NOTE — Progress Notes (Signed)
Subjective:  Shelly Koch is a 27 y.o. G2P1001 at 101w4d being seen today for ongoing prenatal care.  She is currently monitored for the following issues for this high-risk pregnancy and has Elevated DSR; History of poor fetal growth; Gestational trophoblastic neoplasm; Environmental and seasonal allergies; Supervision of high risk pregnancy, antepartum; History of gestational hypertension; Poor weight gain of pregnancy, unspecified trimester; and Iron deficiency anemia during pregnancy on their problem list.  Patient reports no complaints.  Contractions: Irregular. Vag. Bleeding: None.  Movement: Present. Denies leaking of fluid.   The following portions of the patient's history were reviewed and updated as appropriate: allergies, current medications, past family history, past medical history, past social history, past surgical history and problem list. Problem list updated.  Objective:   Vitals:   07/28/17 1644  BP: 112/70  Pulse: 92  Weight: 47.6 kg (105 lb)    Fetal Status: Fetal Heart Rate (bpm): 150   Movement: Present     General:  Alert, oriented and cooperative. Patient is in no acute distress.  Skin: Skin is warm and dry. No rash noted.   Cardiovascular: Normal heart rate noted  Respiratory: Normal respiratory effort, no problems with respiration noted  Abdomen: Soft, gravid, appropriate for gestational age. Pain/Pressure: Absent     Pelvic:  Cervical exam deferred        Extremities: Normal range of motion.  Edema: None  Mental Status: Normal mood and affect. Normal behavior. Normal judgment and thought content.   Urinalysis:      Assessment and Plan:  Pregnancy: G2P1001 at [redacted]w[redacted]d  1. Supervision of high risk pregnancy, antepartum Stable  2. History of gestational hypertension BP stable  No meds Declines BASA  3. Gestational trophoblastic neoplasm S/P Tx Placenta to pathology Follow BHCG PP  4. Elevated DSR Low risk Panorama U/S appropriate growth on  07/18/17 F/U scheduled  5. History of poor fetal growth See above  6. Iron deficiency anemia during pregnancy Continue with PNV  7. Poor weight gain of pregnancy, unspecified trimester Appropriate fetal growth on U/S 07/18/17 F/U scheduled Continue with high caloric diet as tolerates  Preterm labor symptoms and general obstetric precautions including but not limited to vaginal bleeding, contractions, leaking of fluid and fetal movement were reviewed in detail with the patient. Please refer to After Visit Summary for other counseling recommendations.  Return in about 2 weeks (around 08/11/2017) for OB visit.   Chancy Milroy, MD

## 2017-07-28 NOTE — Progress Notes (Signed)
Pt is here for routine prenatal visit. States is feeling contractions more often. Ctxs are not painful but just tight. Denies vag bleeding, LOF, or bleeding

## 2017-08-03 ENCOUNTER — Encounter (HOSPITAL_COMMUNITY): Payer: Self-pay | Admitting: Student

## 2017-08-03 ENCOUNTER — Inpatient Hospital Stay (HOSPITAL_COMMUNITY)
Admission: AD | Admit: 2017-08-03 | Discharge: 2017-08-03 | Disposition: A | Discharge status: Home / Self Care | Payer: Medicaid Other | Source: Ambulatory Visit | Attending: Obstetrics & Gynecology | Admitting: Obstetrics & Gynecology

## 2017-08-03 DIAGNOSIS — Z3A32 32 weeks gestation of pregnancy: Secondary | ICD-10-CM | POA: Diagnosis not present

## 2017-08-03 DIAGNOSIS — R300 Dysuria: Secondary | ICD-10-CM | POA: Insufficient documentation

## 2017-08-03 DIAGNOSIS — O4703 False labor before 37 completed weeks of gestation, third trimester: Secondary | ICD-10-CM | POA: Diagnosis not present

## 2017-08-03 DIAGNOSIS — R1030 Lower abdominal pain, unspecified: Secondary | ICD-10-CM | POA: Diagnosis present

## 2017-08-03 LAB — URINALYSIS, ROUTINE W REFLEX MICROSCOPIC
BILIRUBIN URINE: NEGATIVE
GLUCOSE, UA: NEGATIVE mg/dL
HGB URINE DIPSTICK: NEGATIVE
KETONES UR: NEGATIVE mg/dL
Leukocytes, UA: NEGATIVE
Nitrite: NEGATIVE
PH: 7 (ref 5.0–8.0)
PROTEIN: NEGATIVE mg/dL
Specific Gravity, Urine: 1.005 (ref 1.005–1.030)

## 2017-08-03 LAB — FETAL FIBRONECTIN: Fetal Fibronectin: NEGATIVE

## 2017-08-03 MED ORDER — COMFORT FIT MATERNITY SUPP SM MISC: 1.0000 [IU] | Freq: Every day | 0 refills | Status: DC | PRN

## 2017-08-03 NOTE — Discharge Instructions (Signed)
Braxton Hicks Contractions °Contractions of the uterus can occur throughout pregnancy, but they are not always a sign that you are in labor. You may have practice contractions called Braxton Hicks contractions. These false labor contractions are sometimes confused with true labor. °What are Braxton Hicks contractions? °Braxton Hicks contractions are tightening movements that occur in the muscles of the uterus before labor. Unlike true labor contractions, these contractions do not result in opening (dilation) and thinning of the cervix. Toward the end of pregnancy (32-34 weeks), Braxton Hicks contractions can happen more often and may become stronger. These contractions are sometimes difficult to tell apart from true labor because they can be very uncomfortable. You should not feel embarrassed if you go to the hospital with false labor. °Sometimes, the only way to tell if you are in true labor is for your health care provider to look for changes in the cervix. The health care provider will do a physical exam and may monitor your contractions. If you are not in true labor, the exam should show that your cervix is not dilating and your water has not broken. °If there are other health problems associated with your pregnancy, it is completely safe for you to be sent home with false labor. You may continue to have Braxton Hicks contractions until you go into true labor. °How to tell the difference between true labor and false labor °True labor °· Contractions last 30-70 seconds. °· Contractions become very regular. °· Discomfort is usually felt in the top of the uterus, and it spreads to the lower abdomen and low back. °· Contractions do not go away with walking. °· Contractions usually become more intense and increase in frequency. °· The cervix dilates and gets thinner. °False labor °· Contractions are usually shorter and not as strong as true labor contractions. °· Contractions are usually irregular. °· Contractions  are often felt in the front of the lower abdomen and in the groin. °· Contractions may go away when you walk around or change positions while lying down. °· Contractions get weaker and are shorter-lasting as time goes on. °· The cervix usually does not dilate or become thin. °Follow these instructions at home: °· Take over-the-counter and prescription medicines only as told by your health care provider. °· Keep up with your usual exercises and follow other instructions from your health care provider. °· Eat and drink lightly if you think you are going into labor. °· If Braxton Hicks contractions are making you uncomfortable: °? Change your position from lying down or resting to walking, or change from walking to resting. °? Sit and rest in a tub of warm water. °? Drink enough fluid to keep your urine pale yellow. Dehydration may cause these contractions. °? Do slow and deep breathing several times an hour. °· Keep all follow-up prenatal visits as told by your health care provider. This is important. °Contact a health care provider if: °· You have a fever. °· You have continuous pain in your abdomen. °Get help right away if: °· Your contractions become stronger, more regular, and closer together. °· You have fluid leaking or gushing from your vagina. °· You pass blood-tinged mucus (bloody show). °· You have bleeding from your vagina. °· You have low back pain that you never had before. °· You feel your baby’s head pushing down and causing pelvic pressure. °· Your baby is not moving inside you as much as it used to. °Summary °· Contractions that occur before labor are called Braxton   Hicks contractions, false labor, or practice contractions.  Braxton Hicks contractions are usually shorter, weaker, farther apart, and less regular than true labor contractions. True labor contractions usually become progressively stronger and regular and they become more frequent.  Manage discomfort from [facility] contractions by  changing position, resting in a warm bath, drinking plenty of water, or practicing deep breathing. This information is not intended to replace advice given to you by your health care provider. Make sure you discuss any questions you have with your health care provider. Document Released: 11/21/2016 Document Revised: 11/21/2016 Document Reviewed: 11/21/2016 Elsevier Interactive Patient Education  2018 [employer].  Dysuria Dysuria is pain or discomfort while urinating. The pain or discomfort may be felt in the tube that carries urine out of the bladder (urethra) or in the surrounding tissue of the genitals. The pain may also be felt in the groin area, lower abdomen, and lower back. You may have to urinate frequently or have the sudden feeling that you have to urinate (urgency). Dysuria can affect both men and women, but is more common in women. Dysuria can be caused by many different things, including:  Urinary tract infection in women.  Infection of the kidney or bladder.  Kidney stones or bladder stones.  Certain sexually transmitted infections (STIs), such as chlamydia.  Dehydration.  Inflammation of the vagina.  Use of certain medicines.  Use of certain soaps or scented products that cause irritation.  Follow these instructions at home: Watch your dysuria for any changes. The following actions may help to reduce any discomfort you are feeling:  Drink enough fluid to keep your urine clear or pale yellow.  Empty your bladder often. Avoid holding urine for long periods of time.  After a bowel movement or urination, women should cleanse from front to back, using each tissue only once.  Empty your bladder after sexual intercourse.  Take medicines only as directed by your health care provider.  If you were prescribed an antibiotic medicine, finish it all even if you start to feel better.  Avoid caffeine, tea, and alcohol. They can irritate the bladder and make dysuria worse. In  men, alcohol may irritate the prostate.  Keep all follow-up visits as directed by your health care provider. This is important.  If you had any tests done to find the cause of dysuria, it is your responsibility to obtain your test results. Ask the lab or department performing the test when and how you will get your results. Talk with your health care provider if you have any questions about your results.  Contact a health care provider if:  You develop pain in your back or sides.  You have a fever.  You have nausea or vomiting.  You have blood in your urine.  You are not urinating as often as you usually do. Get help right away if:  You pain is severe and not relieved with medicines.  You are unable to hold down any fluids.  You or someone else notices a change in your mental function.  You have a rapid heartbeat at rest.  You have shaking or chills.  You feel extremely weak. This information is not intended to replace advice given to you by your health care provider. Make sure you discuss any questions you have with your health care provider. Document Released: 04/05/2004 Document Revised: 12/14/2015 Document Reviewed: 03/03/2014 Elsevier Interactive Patient Education  [employer].

## 2017-08-03 NOTE — MAU Provider Note (Signed)
History     CSN: [id number]  Arrival date and time: 08/03/17 1830   First Provider Initiated Contact with Patient 08/03/17 1856      Chief Complaint  Patient presents with  . Abdominal Pain  . Contractions   HPI Shelly Koch is a 27 y.o. G2P1001 at [redacted]w[redacted]d who presents with dysuria and abdominal pain. Symptoms began on Thursday. Reports constant lower abdominal pain & intermittent abdominal tightening that occurs less than once per hour. Also has burning with urination. Endorses urinary frequency. Denies n/v, flank pain, fever/chills, hematuria, vaginal bleeding, LOF. Positive fetal movement.   OB History    Gravida Para Term Preterm AB Living   2 1 1     1    SAB TAB Ectopic Multiple Live Births         0 1      Past Medical History:  Diagnosis Date  . Anemia   . Gestational diabetes   . Gestational trophoblastic neoplasm   . PICC (peripherally inserted central catheter) in place 12/24/2014  . Preterm labor   . Pyelonephritis     Past Surgical History:  Procedure Laterality Date  . DILATION AND EVACUATION N/A 08/10/2014   Procedure: DILATATION AND EVACUATION;  Surgeon: Lavonia Drafts, MD;  Location: [facility];  Service: Gynecology;  Laterality: N/A;  . HYSTEROSCOPY N/A 08/10/2014   Procedure: HYSTEROSCOPY;  Surgeon: Lavonia Drafts, MD;  Location: [facility];  Service: Gynecology;  Laterality: N/A;    Family History  Problem Relation Age of Onset  . Alcohol abuse Neg Hx     Social History   Tobacco Use  . Smoking status: Never Smoker  . Smokeless tobacco: Never Used  Substance Use Topics  . Alcohol use: No  . Drug use: No    Allergies:  Allergies  Allergen Reactions  . [place]     Hives and itching  . Ondansetron Hives and Itching  . Phenergan [Promethazine] Hives and Itching    Note: pt tolerates PO COMPAZINE  . Zofran [Ondansetron Hcl] Rash    Medications Prior to Admission  Medication Sig Dispense Refill Last Dose  . Elastic Bandages &  Supports (COMFORT FIT MATERNITY SUPP SM) MISC 1 Device by Does not apply route daily as needed. (Patient not taking: Reported on 07/11/2017) 1 each 0 Not Taking  . Prenatal Vit-Fe Fumarate-FA (PRENATAL MULTIVITAMIN) TABS tablet Take 1 tablet by mouth daily at 12 noon. 30 tablet 11     Review of Systems  Constitutional: Negative.   Gastrointestinal: Positive for abdominal pain. Negative for constipation, diarrhea, nausea and vomiting.  Genitourinary: Positive for dysuria and frequency. Negative for difficulty urinating, flank pain, genital sores, hematuria, vaginal bleeding and vaginal discharge.  Musculoskeletal: Negative for back pain.   Physical Exam   Blood pressure 116/75, pulse 92, temperature 99.1 F (37.3 C), temperature source Oral, resp. rate 18, height 4\' 10"  (1.473 m), weight 109 lb 12 oz (49.8 kg), last menstrual period 11/30/2016.  Physical Exam  Nursing note and vitals reviewed. Constitutional: She is oriented to person, place, and time. She appears well-developed and well-nourished. No distress.  HENT:  Head: Normocephalic and atraumatic.  Eyes: Conjunctivae are normal. Right eye exhibits no discharge. Left eye exhibits no discharge. No scleral icterus.  Neck: Normal range of motion.  Respiratory: Effort normal. No respiratory distress.  GI: Soft. There is no tenderness. There is no CVA tenderness.  Genitourinary: There is no rash or lesion on the right labia. There is no rash or lesion on  the left labia.  Genitourinary Comments: Dilation: 1 Effacement (%): 40 Cervical Position: Posterior Presentation: Vertex Exam by:: E. Albaraa Swingle NP   Neurological: She is alert and oriented to person, place, and time.  Skin: Skin is warm and dry. She is not diaphoretic.  Psychiatric: She has a normal mood and affect. Her behavior is normal. Judgment and thought content normal.    MAU Course  Procedures Results for orders placed or performed during the hospital encounter of  08/03/17 (from the past 24 hour(s))  Urinalysis, Routine w reflex microscopic     Status: None   Collection Time: 08/03/17  6:30 PM  Result Value Ref Range   Color, Urine YELLOW YELLOW   APPearance CLEAR CLEAR   Specific Gravity, Urine 1.005 1.005 - 1.030   pH 7.0 5.0 - 8.0   Glucose, UA NEGATIVE NEGATIVE mg/dL   Hgb urine dipstick NEGATIVE NEGATIVE   Bilirubin Urine NEGATIVE NEGATIVE   Ketones, ur NEGATIVE NEGATIVE mg/dL   Protein, ur NEGATIVE NEGATIVE mg/dL   Nitrite NEGATIVE NEGATIVE   Leukocytes, UA NEGATIVE NEGATIVE  Fetal fibronectin     Status: None   Collection Time: 08/03/17  7:10 PM  Result Value Ref Range   Fetal Fibronectin NEGATIVE NEGATIVE    MDM NST:  Baseline: 145 bpm, Variability: Good {> 6 bpm), Accelerations: Reactive, Decelerations: Absent and UI  SVE 1/40/-3 posterior. FFN negative. Cervix unchanged.  U/a negative -- will send for culture d/t patient's complaint of dysuria & frequency  Assessment and Plan  A; 1. Dysuria   2. Preterm uterine contractions in third trimester, antepartum   3. [redacted] weeks gestation of pregnancy    P: Discharge home Increase water intake Discussed reasons to return to MAU Keep f/u with OB Urine culture pending  Jorje Guild 08/03/2017, 6:57 PM

## 2017-08-03 NOTE — MAU Note (Addendum)
Reports lower abdominal pain and ctx since Thursday. Constant abdominal pain and intermittent ctx. Reports some vaginal discharge but reports its normal. No LOF, no vag bleeding. +FM. Reports some burning with urination

## 2017-08-04 ENCOUNTER — Other Ambulatory Visit: Payer: Self-pay | Admitting: Certified Nurse Midwife

## 2017-08-05 LAB — CULTURE, OB URINE

## 2017-08-15 ENCOUNTER — Ambulatory Visit (HOSPITAL_COMMUNITY): Payer: Medicaid Other

## 2017-08-15 ENCOUNTER — Encounter (HOSPITAL_COMMUNITY): Payer: Self-pay

## 2017-08-15 ENCOUNTER — Encounter: Payer: Self-pay | Admitting: Obstetrics and Gynecology

## 2017-08-15 ENCOUNTER — Ambulatory Visit (HOSPITAL_COMMUNITY)
Admission: RE | Admit: 2017-08-15 | Discharge: 2017-08-15 | Disposition: A | Discharge status: Home / Self Care | Payer: Medicaid Other | Source: Ambulatory Visit | Attending: Medical | Admitting: Medical

## 2017-08-15 ENCOUNTER — Ambulatory Visit (INDEPENDENT_AMBULATORY_CARE_PROVIDER_SITE_OTHER): Payer: Medicaid Other | Admitting: Obstetrics and Gynecology

## 2017-08-15 VITALS — BP 113/76 | HR 92

## 2017-08-15 DIAGNOSIS — Z3A34 34 weeks gestation of pregnancy: Secondary | ICD-10-CM | POA: Insufficient documentation

## 2017-08-15 DIAGNOSIS — O09293 Supervision of pregnancy with other poor reproductive or obstetric history, third trimester: Secondary | ICD-10-CM | POA: Insufficient documentation

## 2017-08-15 DIAGNOSIS — D509 Iron deficiency anemia, unspecified: Secondary | ICD-10-CM

## 2017-08-15 DIAGNOSIS — O288 Other abnormal findings on antenatal screening of mother: Secondary | ICD-10-CM

## 2017-08-15 DIAGNOSIS — O0990 Supervision of high risk pregnancy, unspecified, unspecified trimester: Secondary | ICD-10-CM

## 2017-08-15 DIAGNOSIS — O0993 Supervision of high risk pregnancy, unspecified, third trimester: Secondary | ICD-10-CM

## 2017-08-15 DIAGNOSIS — O99019 Anemia complicating pregnancy, unspecified trimester: Secondary | ICD-10-CM

## 2017-08-15 DIAGNOSIS — O019 Hydatidiform mole, unspecified: Secondary | ICD-10-CM

## 2017-08-15 DIAGNOSIS — O2610 Low weight gain in pregnancy, unspecified trimester: Secondary | ICD-10-CM

## 2017-08-15 DIAGNOSIS — Z8759 Personal history of other complications of pregnancy, childbirth and the puerperium: Secondary | ICD-10-CM

## 2017-08-15 DIAGNOSIS — O09899 Supervision of other high risk pregnancies, unspecified trimester: Secondary | ICD-10-CM

## 2017-08-15 DIAGNOSIS — O281 Abnormal biochemical finding on antenatal screening of mother: Secondary | ICD-10-CM | POA: Insufficient documentation

## 2017-08-15 NOTE — Patient Instructions (Signed)
.  mle

## 2017-08-15 NOTE — Progress Notes (Signed)
Subjective:  Shelly Koch is a 27 y.o. G2P1001 at [redacted]w[redacted]d being seen today for ongoing prenatal care.  She is currently monitored for the following issues for this high-risk pregnancy and has Elevated DSR; History of poor fetal growth; Gestational trophoblastic neoplasm; Environmental and seasonal allergies; Supervision of high risk pregnancy, antepartum; History of gestational hypertension; Poor weight gain of pregnancy, unspecified trimester; and Iron deficiency anemia during pregnancy on their problem list.  Patient reports occasional contractions and URI Sx.  Contractions: Irritability. Vag. Bleeding: None.  Movement: Present. Denies leaking of fluid.   The following portions of the patient's history were reviewed and updated as appropriate: allergies, current medications, past family history, past medical history, past social history, past surgical history and problem list. Problem list updated.  Objective:   Vitals:   08/15/17 1124  BP: 113/76  Pulse: 92    Fetal Status: Fetal Heart Rate (bpm): 148   Movement: Present     General:  Alert, oriented and cooperative. Patient is in no acute distress.  Skin: Skin is warm and dry. No rash noted.   Cardiovascular: Normal heart rate noted  Respiratory: Normal respiratory effort, no problems with respiration noted  Abdomen: Soft, gravid, appropriate for gestational age. Pain/Pressure: Present     Pelvic:  Cervical exam performed        Extremities: Normal range of motion.  Edema: None  Mental Status: Normal mood and affect. Normal behavior. Normal judgment and thought content.   Urinalysis:      Assessment and Plan:  Pregnancy: G2P1001 at [redacted]w[redacted]d  1. Supervision of high risk pregnancy, antepartum Stable OTC meds for URI reviewed with pt U/S today  2. Gestational trophoblastic neoplasm   3. History of gestational hypertension BP stable No meds  Preterm labor symptoms and general obstetric precautions including but not limited to  vaginal bleeding, contractions, leaking of fluid and fetal movement were reviewed in detail with the patient. Please refer to After Visit Summary for other counseling recommendations.  Return in about 2 weeks (around 08/29/2017) for OB visit.   Chancy Milroy, MD

## 2017-08-28 ENCOUNTER — Encounter: Payer: Self-pay | Admitting: Obstetrics and Gynecology

## 2017-08-28 ENCOUNTER — Ambulatory Visit (INDEPENDENT_AMBULATORY_CARE_PROVIDER_SITE_OTHER): Payer: Medicaid Other | Admitting: Obstetrics and Gynecology

## 2017-08-28 ENCOUNTER — Other Ambulatory Visit (HOSPITAL_COMMUNITY)
Admission: RE | Admit: 2017-08-28 | Discharge: 2017-08-28 | Disposition: A | Discharge status: Home / Self Care | Payer: Medicaid Other | Source: Ambulatory Visit | Attending: Obstetrics and Gynecology | Admitting: Obstetrics and Gynecology

## 2017-08-28 VITALS — BP 109/77 | HR 90 | Wt 109.9 lb

## 2017-08-28 DIAGNOSIS — O0990 Supervision of high risk pregnancy, unspecified, unspecified trimester: Secondary | ICD-10-CM | POA: Diagnosis not present

## 2017-08-28 DIAGNOSIS — O019 Hydatidiform mole, unspecified: Secondary | ICD-10-CM

## 2017-08-28 DIAGNOSIS — Z8759 Personal history of other complications of pregnancy, childbirth and the puerperium: Secondary | ICD-10-CM

## 2017-08-28 LAB — POCT URINALYSIS DIP (DEVICE)
BILIRUBIN URINE: NEGATIVE
Glucose, UA: NEGATIVE mg/dL
HGB URINE DIPSTICK: NEGATIVE
Ketones, ur: NEGATIVE mg/dL
Leukocytes, UA: NEGATIVE
NITRITE: NEGATIVE
PH: 7 (ref 5.0–8.0)
PROTEIN: NEGATIVE mg/dL
Specific Gravity, Urine: 1.02 (ref 1.005–1.030)
UROBILINOGEN UA: 0.2 mg/dL (ref 0.0–1.0)

## 2017-08-28 LAB — OB RESULTS CONSOLE GBS: GBS: NEGATIVE

## 2017-08-28 LAB — OB RESULTS CONSOLE GC/CHLAMYDIA: GC PROBE AMP, GENITAL: NEGATIVE

## 2017-08-28 NOTE — Patient Instructions (Signed)
Third Trimester of Pregnancy The third trimester is from week 28 through week 40 (months 7 through 9). The third trimester is a time when the unborn baby (fetus) is growing rapidly. At the end of the ninth month, the fetus is about 20 inches in length and weighs 6-10 pounds. Body changes during your third trimester Your body will continue to go through many changes during pregnancy. The changes vary from woman to woman. During the third trimester:  Your weight will continue to increase. You can expect to gain 25-35 pounds (11-16 kg) by the end of the pregnancy.  You may begin to get stretch marks on your hips, abdomen, and breasts.  You may urinate more often because the fetus is moving lower into your pelvis and pressing on your bladder.  You may develop or continue to have heartburn. This is caused by increased hormones that slow down muscles in the digestive tract.  You may develop or continue to have constipation because increased hormones slow digestion and cause the muscles that push waste through your intestines to relax.  You may develop hemorrhoids. These are swollen veins (varicose veins) in the rectum that can itch or be painful.  You may develop swollen, bulging veins (varicose veins) in your legs.  You may have increased body aches in the pelvis, back, or thighs. This is due to weight gain and increased hormones that are relaxing your joints.  You may have changes in your hair. These can include thickening of your hair, rapid growth, and changes in texture. Some women also have hair loss during or after pregnancy, or hair that feels dry or thin. Your hair will most likely return to normal after your baby is born.  Your breasts will continue to grow and they will continue to become tender. A yellow fluid (colostrum) may leak from your breasts. This is the first milk you are producing for your baby.  Your belly button may stick out.  You may notice more swelling in your hands,  face, or ankles.  You may have increased tingling or numbness in your hands, arms, and legs. The skin on your belly may also feel numb.  You may feel short of breath because of your expanding uterus.  You may have more problems sleeping. This can be caused by the size of your belly, increased need to urinate, and an increase in your body's metabolism.  You may notice the fetus "dropping," or moving lower in your abdomen (lightening).  You may have increased vaginal discharge.  You may notice your joints feel loose and you may have pain around your pelvic bone.  What to expect at prenatal visits You will have prenatal exams every 2 weeks until week 36. Then you will have weekly prenatal exams. During a routine prenatal visit:  You will be weighed to make sure you and the baby are growing normally.  Your blood pressure will be taken.  Your abdomen will be measured to track your baby's growth.  The fetal heartbeat will be listened to.  Any test results from the previous visit will be discussed.  You may have a cervical check near your due date to see if your cervix has softened or thinned (effaced).  You will be tested for Group B streptococcus. This happens between 35 and 37 weeks.  Your health care provider may ask you:  What your birth plan is.  How you are feeling.  If you are feeling the baby move.  If you have had   any abnormal symptoms, such as leaking fluid, bleeding, severe headaches, or abdominal cramping.  If you are using any tobacco products, including cigarettes, chewing tobacco, and electronic cigarettes.  If you have any questions.  Other tests or screenings that may be performed during your third trimester include:  Blood tests that check for low iron levels (anemia).  Fetal testing to check the health, activity level, and growth of the fetus. Testing is done if you have certain medical conditions or if there are problems during the  pregnancy.  Nonstress test (NST). This test checks the health of your baby to make sure there are no signs of problems, such as the baby not getting enough oxygen. During this test, a belt is placed around your belly. The baby is made to move, and its heart rate is monitored during movement.  What is false labor? False labor is a condition in which you feel small, irregular tightenings of the muscles in the womb (contractions) that usually go away with rest, changing position, or drinking water. These are called Braxton Hicks contractions. Contractions may last for hours, days, or even weeks before true labor sets in. If contractions come at regular intervals, become more frequent, increase in intensity, or become painful, you should see your health care provider. What are the signs of labor?  Abdominal cramps.  Regular contractions that start at 10 minutes apart and become stronger and more frequent with time.  Contractions that start on the top of the uterus and spread down to the lower abdomen and back.  Increased pelvic pressure and dull back pain.  A watery or bloody mucus discharge that comes from the vagina.  Leaking of amniotic fluid. This is also known as your "water breaking." It could be a slow trickle or a gush. Let your health care provider know if it has a color or strange odor. If you have any of these signs, call your health care provider right away, even if it is before your due date. Follow these instructions at home: Medicines  Follow your health care provider's instructions regarding medicine use. Specific medicines may be either safe or unsafe to take during pregnancy.  Take a prenatal vitamin that contains at least 600 micrograms (mcg) of folic acid.  If you develop constipation, try taking a stool softener if your health care provider approves. Eating and drinking  Eat a balanced diet that includes fresh fruits and vegetables, whole grains, good sources of protein  such as meat, eggs, or tofu, and low-fat dairy. Your health care provider will help you determine the amount of weight gain that is right for you.  Avoid raw meat and uncooked cheese. These carry germs that can cause birth defects in the baby.  If you have low calcium intake from food, talk to your health care provider about whether you should take a daily calcium supplement.  Eat four or five small meals rather than three large meals a day.  Limit foods that are high in fat and processed sugars, such as fried and sweet foods.  To prevent constipation: ? Drink enough fluid to keep your urine clear or pale yellow. ? Eat foods that are high in fiber, such as fresh fruits and vegetables, whole grains, and beans. Activity  Exercise only as directed by your health care provider. Most women can continue their usual exercise routine during pregnancy. Try to exercise for 30 minutes at least 5 days a week. Stop exercising if you experience uterine contractions.  Avoid heavy   lifting.  Do not exercise in extreme heat or humidity, or at high altitudes.  Wear low-heel, comfortable shoes.  Practice good posture.  You may continue to have sex unless your health care provider tells you otherwise. Relieving pain and discomfort  Take frequent breaks and rest with your legs elevated if you have leg cramps or low back pain.  Take warm sitz baths to soothe any pain or discomfort caused by hemorrhoids. Use hemorrhoid cream if your health care provider approves.  Wear a good support bra to prevent discomfort from breast tenderness.  If you develop varicose veins: ? Wear support pantyhose or compression stockings as told by your healthcare provider. ? Elevate your feet for 15 minutes, 3-4 times a day. Prenatal care  Write down your questions. Take them to your prenatal visits.  Keep all your prenatal visits as told by your health care provider. This is important. Safety  Wear your seat belt at  all times when driving.  Make a list of emergency phone numbers, including numbers for family, friends, the hospital, and police and fire departments. General instructions  Avoid cat litter boxes and soil used by cats. These carry germs that can cause birth defects in the baby. If you have a cat, ask someone to clean the litter box for you.  Do not travel far distances unless it is absolutely necessary and only with the approval of your health care provider.  Do not use hot tubs, steam rooms, or saunas.  Do not drink alcohol.  Do not use any products that contain nicotine or tobacco, such as cigarettes and e-cigarettes. If you need help quitting, ask your health care provider.  Do not use any medicinal herbs or unprescribed drugs. These chemicals affect the formation and growth of the baby.  Do not douche or use tampons or scented sanitary pads.  Do not cross your legs for long periods of time.  To prepare for the arrival of your baby: ? Take prenatal classes to understand, practice, and ask questions about labor and delivery. ? Make a trial run to the hospital. ? Visit the hospital and tour the maternity area. ? Arrange for maternity or paternity leave through employers. ? Arrange for family and friends to take care of pets while you are in the hospital. ? Purchase a rear-facing car seat and make sure you know how to install it in your car. ? Pack your hospital bag. ? Prepare the baby's nursery. Make sure to remove all pillows and stuffed animals from the baby's crib to prevent suffocation.  Visit your dentist if you have not gone during your pregnancy. Use a soft toothbrush to brush your teeth and be gentle when you floss. Contact a health care provider if:  You are unsure if you are in labor or if your water has broken.  You become dizzy.  You have mild pelvic cramps, pelvic pressure, or nagging pain in your abdominal area.  You have lower back pain.  You have persistent  nausea, vomiting, or diarrhea.  You have an unusual or bad smelling vaginal discharge.  You have pain when you urinate. Get help right away if:  Your water breaks before 37 weeks.  You have regular contractions less than 5 minutes apart before 37 weeks.  You have a fever.  You are leaking fluid from your vagina.  You have spotting or bleeding from your vagina.  You have severe abdominal pain or cramping.  You have rapid weight loss or weight gain.    You have shortness of breath with chest pain.  You notice sudden or extreme swelling of your face, hands, ankles, feet, or legs.  Your baby makes fewer than 10 movements in 2 hours.  You have severe headaches that do not go away when you take medicine.  You have vision changes. Summary  The third trimester is from week 28 through week 40, months 7 through 9. The third trimester is a time when the unborn baby (fetus) is growing rapidly.  During the third trimester, your discomfort may increase as you and your baby continue to gain weight. You may have abdominal, leg, and back pain, sleeping problems, and an increased need to urinate.  During the third trimester your breasts will keep growing and they will continue to become tender. A yellow fluid (colostrum) may leak from your breasts. This is the first milk you are producing for your baby.  False labor is a condition in which you feel small, irregular tightenings of the muscles in the womb (contractions) that eventually go away. These are called Braxton Hicks contractions. Contractions may last for hours, days, or even weeks before true labor sets in.  Signs of labor can include: abdominal cramps; regular contractions that start at 10 minutes apart and become stronger and more frequent with time; watery or bloody mucus discharge that comes from the vagina; increased pelvic pressure and dull back pain; and leaking of amniotic fluid. This information is not intended to replace advice  given to you by your health care provider. Make sure you discuss any questions you have with your health care provider. Document Released: 07/02/2001 Document Revised: 12/14/2015 Document Reviewed: 09/08/2012 Elsevier Interactive Patient Education  2017 [employer]  

## 2017-08-28 NOTE — Progress Notes (Signed)
Pt states for the past 3 days she has been having bad cramps in her back

## 2017-08-28 NOTE — Progress Notes (Signed)
Subjective:  Shelly Koch is a 27 y.o. G2P1001 at [redacted]w[redacted]d being seen today for ongoing prenatal care.  She is currently monitored for the following issues for this high-risk pregnancy and has Elevated DSR; History of poor fetal growth; Gestational trophoblastic neoplasm; Environmental and seasonal allergies; Supervision of high risk pregnancy, antepartum; History of gestational hypertension; Poor weight gain of pregnancy, unspecified trimester; and Iron deficiency anemia during pregnancy on their problem list.  Patient reports general discomforts of pregnancy.  Contractions: Irritability. Vag. Bleeding: None.  Movement: Present. Denies leaking of fluid.   The following portions of the patient's history were reviewed and updated as appropriate: allergies, current medications, past family history, past medical history, past social history, past surgical history and problem list. Problem list updated.  Objective:   Vitals:   08/28/17 1624  BP: 109/77  Pulse: 90  Weight: 109 lb 14.4 oz (49.9 kg)    Fetal Status: Fetal Heart Rate (bpm): 142   Movement: Present     General:  Alert, oriented and cooperative. Patient is in no acute distress.  Skin: Skin is warm and dry. No rash noted.   Cardiovascular: Normal heart rate noted  Respiratory: Normal respiratory effort, no problems with respiration noted  Abdomen: Soft, gravid, appropriate for gestational age. Pain/Pressure: Present     Pelvic:  Cervical exam performed        Extremities: Normal range of motion.  Edema: None  Mental Status: Normal mood and affect. Normal behavior. Normal judgment and thought content.   Urinalysis:      Assessment and Plan:  Pregnancy: G2P1001 at [redacted]w[redacted]d  1. Supervision of high risk pregnancy, antepartum Stable - Culture, beta strep (group b only) - GC/Chlamydia probe amp ([place])not at [facility]  2. History of gestational hypertension BP stable  3. Gestational trophoblastic neoplasm Stable Will need BHCG  PP Placenta to pathology  Preterm labor symptoms and general obstetric precautions including but not limited to vaginal bleeding, contractions, leaking of fluid and fetal movement were reviewed in detail with the patient. Please refer to After Visit Summary for other counseling recommendations.  Return in about 1 week (around 09/04/2017) for OB visit.   Chancy Milroy, MD

## 2017-08-28 NOTE — Progress Notes (Signed)
Tdap given.  

## 2017-08-29 LAB — GC/CHLAMYDIA PROBE AMP (~~LOC~~) NOT AT ARMC
CHLAMYDIA, DNA PROBE: NEGATIVE
Neisseria Gonorrhea: NEGATIVE

## 2017-09-01 LAB — CULTURE, BETA STREP (GROUP B ONLY): Strep Gp B Culture: NEGATIVE

## 2017-09-04 ENCOUNTER — Ambulatory Visit (INDEPENDENT_AMBULATORY_CARE_PROVIDER_SITE_OTHER): Payer: Medicaid Other | Admitting: Student

## 2017-09-04 ENCOUNTER — Encounter: Payer: Medicaid Other | Admitting: Obstetrics and Gynecology

## 2017-09-04 VITALS — BP 122/76 | HR 87 | Wt 111.5 lb

## 2017-09-04 DIAGNOSIS — O0990 Supervision of high risk pregnancy, unspecified, unspecified trimester: Secondary | ICD-10-CM

## 2017-09-04 DIAGNOSIS — O0993 Supervision of high risk pregnancy, unspecified, third trimester: Secondary | ICD-10-CM

## 2017-09-04 DIAGNOSIS — Z23 Encounter for immunization: Secondary | ICD-10-CM | POA: Diagnosis not present

## 2017-09-04 DIAGNOSIS — Z8759 Personal history of other complications of pregnancy, childbirth and the puerperium: Secondary | ICD-10-CM

## 2017-09-04 DIAGNOSIS — Z87898 Personal history of other specified conditions: Secondary | ICD-10-CM

## 2017-09-04 NOTE — Patient Instructions (Signed)

## 2017-09-04 NOTE — Progress Notes (Signed)
   PRENATAL VISIT NOTE  Subjective:  Shelly Koch is a 27 y.o. G2P1001 at [redacted]w[redacted]d being seen today for ongoing prenatal care.  She is currently monitored for the following issues for this high-risk pregnancy and has Elevated DSR; History of poor fetal growth; Gestational trophoblastic neoplasm; Environmental and seasonal allergies; Supervision of high risk pregnancy, antepartum; History of gestational hypertension; Poor weight gain of pregnancy, unspecified trimester; and Iron deficiency anemia during pregnancy on their problem list.  Patient reports no complaints.  Contractions: Irritability. Vag. Bleeding: None.  Movement: Present. Denies leaking of fluid.   The following portions of the patient's history were reviewed and updated as appropriate: allergies, current medications, past family history, past medical history, past social history, past surgical history and problem list. Problem list updated.  Objective:   Vitals:   09/04/17 1108  BP: 122/76  Pulse: 87  Weight: 111 lb 8 oz (50.6 kg)    Fetal Status: Fetal Heart Rate (bpm): 152 Fundal Height: 32 cm Movement: Present     General:  Alert, oriented and cooperative. Patient is in no acute distress.  Skin: Skin is warm and dry. No rash noted.   Cardiovascular: Normal heart rate noted  Respiratory: Normal respiratory effort, no problems with respiration noted  Abdomen: Soft, gravid, appropriate for gestational age.  Pain/Pressure: Present     Pelvic: Cervical exam deferred        Extremities: Normal range of motion.  Edema: Trace  Mental Status:  Normal mood and affect. Normal behavior. Normal judgment and thought content.   Assessment and Plan:  Pregnancy: G2P1001 at [redacted]w[redacted]d  1. Supervision of high risk pregnancy, antepartum -FH today measures 32; will order growth scan.  - Tdap vaccine greater than or equal to 7yo IM - [place] MFM OB FOLLOW UP; Future - 2. History of gestational hypertension BP has been normal so far.  Term  labor symptoms and general obstetric precautions including but not limited to vaginal bleeding, contractions, leaking of fluid and fetal movement were reviewed in detail with the patient. Please refer to After Visit Summary for other counseling recommendations.  Return in about 1 week (around 09/11/2017), or HROB.   [place]arr [place], CNM

## 2017-09-05 ENCOUNTER — Other Ambulatory Visit: Payer: Self-pay

## 2017-09-05 ENCOUNTER — Inpatient Hospital Stay (HOSPITAL_COMMUNITY)
Admission: AD | Admit: 2017-09-05 | Discharge: 2017-09-06 | Disposition: A | Discharge status: Home / Self Care | Payer: Medicaid Other | Source: Ambulatory Visit | Attending: Obstetrics and Gynecology | Admitting: Obstetrics and Gynecology

## 2017-09-05 ENCOUNTER — Encounter (HOSPITAL_COMMUNITY): Payer: Self-pay | Admitting: *Deleted

## 2017-09-05 DIAGNOSIS — O99019 Anemia complicating pregnancy, unspecified trimester: Secondary | ICD-10-CM

## 2017-09-05 DIAGNOSIS — O479 False labor, unspecified: Secondary | ICD-10-CM | POA: Insufficient documentation

## 2017-09-05 DIAGNOSIS — O2610 Low weight gain in pregnancy, unspecified trimester: Secondary | ICD-10-CM

## 2017-09-05 DIAGNOSIS — D509 Iron deficiency anemia, unspecified: Secondary | ICD-10-CM

## 2017-09-05 DIAGNOSIS — O0990 Supervision of high risk pregnancy, unspecified, unspecified trimester: Secondary | ICD-10-CM

## 2017-09-05 NOTE — MAU Note (Signed)
Ctxs for 4 days. Worse tonight. Some brown vag d/c.

## 2017-09-06 DIAGNOSIS — O479 False labor, unspecified: Secondary | ICD-10-CM | POA: Diagnosis not present

## 2017-09-06 NOTE — Discharge Instructions (Signed)
Braxton Hicks Contractions °Contractions of the uterus can occur throughout pregnancy, but they are not always a sign that you are in labor. You may have practice contractions called Braxton Hicks contractions. These false labor contractions are sometimes confused with true labor. °What are Braxton Hicks contractions? °Braxton Hicks contractions are tightening movements that occur in the muscles of the uterus before labor. Unlike true labor contractions, these contractions do not result in opening (dilation) and thinning of the cervix. Toward the end of pregnancy (32-34 weeks), Braxton Hicks contractions can happen more often and may become stronger. These contractions are sometimes difficult to tell apart from true labor because they can be very uncomfortable. You should not feel embarrassed if you go to the hospital with false labor. °Sometimes, the only way to tell if you are in true labor is for your health care provider to look for changes in the cervix. The health care provider will do a physical exam and may monitor your contractions. If you are not in true labor, the exam should show that your cervix is not dilating and your water has not broken. °If there are other health problems associated with your pregnancy, it is completely safe for you to be sent home with false labor. You may continue to have Braxton Hicks contractions until you go into true labor. °How to tell the difference between true labor and false labor °True labor °· Contractions last 30-70 seconds. °· Contractions become very regular. °· Discomfort is usually felt in the top of the uterus, and it spreads to the lower abdomen and low back. °· Contractions do not go away with walking. °· Contractions usually become more intense and increase in frequency. °· The cervix dilates and gets thinner. °False labor °· Contractions are usually shorter and not as strong as true labor contractions. °· Contractions are usually irregular. °· Contractions  are often felt in the front of the lower abdomen and in the groin. °· Contractions may go away when you walk around or change positions while lying down. °· Contractions get weaker and are shorter-lasting as time goes on. °· The cervix usually does not dilate or become thin. °Follow these instructions at home: °· Take over-the-counter and prescription medicines only as told by your health care provider. °· Keep up with your usual exercises and follow other instructions from your health care provider. °· Eat and drink lightly if you think you are going into labor. °· If Braxton Hicks contractions are making you uncomfortable: °? Change your position from lying down or resting to walking, or change from walking to resting. °? Sit and rest in a tub of warm water. °? Drink enough fluid to keep your urine pale yellow. Dehydration may cause these contractions. °? Do slow and deep breathing several times an hour. °· Keep all follow-up prenatal visits as told by your health care provider. This is important. °Contact a health care provider if: °· You have a fever. °· You have continuous pain in your abdomen. °Get help right away if: °· Your contractions become stronger, more regular, and closer together. °· You have fluid leaking or gushing from your vagina. °· You pass blood-tinged mucus (bloody show). °· You have bleeding from your vagina. °· You have low back pain that you never had before. °· You feel your baby’s head pushing down and causing pelvic pressure. °· Your baby is not moving inside you as much as it used to. °Summary °· Contractions that occur before labor are called Braxton   Hicks contractions, false labor, or practice contractions. °· Braxton Hicks contractions are usually shorter, weaker, farther apart, and less regular than true labor contractions. True labor contractions usually become progressively stronger and regular and they become more frequent. °· Manage discomfort from Braxton Hicks contractions by  changing position, resting in a warm bath, drinking plenty of water, or practicing deep breathing. °This information is not intended to replace advice given to you by your health care provider. Make sure you discuss any questions you have with your health care provider. °Document Released: 11/21/2016 Document Revised: 11/21/2016 Document Reviewed: 11/21/2016 °Elsevier Interactive Patient Education © 2018 [employer] ° °

## 2017-09-09 ENCOUNTER — Ambulatory Visit (INDEPENDENT_AMBULATORY_CARE_PROVIDER_SITE_OTHER): Payer: Medicaid Other | Admitting: Obstetrics and Gynecology

## 2017-09-09 VITALS — BP 114/78 | HR 93 | Wt 111.5 lb

## 2017-09-09 DIAGNOSIS — Z8759 Personal history of other complications of pregnancy, childbirth and the puerperium: Secondary | ICD-10-CM

## 2017-09-09 DIAGNOSIS — O0990 Supervision of high risk pregnancy, unspecified, unspecified trimester: Secondary | ICD-10-CM

## 2017-09-09 DIAGNOSIS — O99019 Anemia complicating pregnancy, unspecified trimester: Secondary | ICD-10-CM

## 2017-09-09 DIAGNOSIS — O019 Hydatidiform mole, unspecified: Secondary | ICD-10-CM

## 2017-09-09 DIAGNOSIS — O0993 Supervision of high risk pregnancy, unspecified, third trimester: Secondary | ICD-10-CM

## 2017-09-09 DIAGNOSIS — D509 Iron deficiency anemia, unspecified: Secondary | ICD-10-CM

## 2017-09-09 NOTE — Progress Notes (Signed)
Subjective:  Shelly Koch is a 27 y.o. G2P1001 at [redacted]w[redacted]d being seen today for ongoing prenatal care.  She is currently monitored for the following issues for this high-risk pregnancy and has Elevated DSR; History of poor fetal growth; Gestational trophoblastic neoplasm; Environmental and seasonal allergies; Supervision of high risk pregnancy, antepartum; History of gestational hypertension; Poor weight gain of pregnancy, unspecified trimester; and Iron deficiency anemia during pregnancy on their problem list.  Patient reports breast tenderness, jaw pain, and supracervical lymph node. Patient has breast tenderness and milk production. Also noticed her jaw hurts when she chews. States she has a cyst over her left clavicle that has been present for a long time but has recently enlarged slightly. Contractions: Irritability. Vag. Bleeding: None.  Movement: Present. Denies leaking of fluid.   The following portions of the patient's history were reviewed and updated as appropriate: allergies, current medications, past family history, past medical history, past social history, past surgical history and problem list. Problem list updated.  Objective:   Vitals:   09/09/17 1000  BP: 114/78  Pulse: 93  Weight: 111 lb 8 oz (50.6 kg)    Fetal Status: Fetal Heart Rate (bpm): 142 Fundal Height: 36 cm Movement: Present     General:  Alert, oriented and cooperative. Patient is in no acute distress.  Lymph Left 1cm supraclavicular lymph node  Skin: Skin is warm and dry. No rash noted.   Cardiovascular: Normal heart rate noted  Respiratory: Normal respiratory effort, no problems with respiration noted  Abdomen: Soft, gravid, appropriate for gestational age. Pain/Pressure: Present     Pelvic: Vag. Bleeding: None Vag D/C Character: Mucous   Cervical exam deferred        Extremities: Normal range of motion.  Edema: Trace  Mental Status: Normal mood and affect. Normal behavior. Normal judgment and thought  content.   Urinalysis:      Assessment and Plan:  Pregnancy: G2P1001 at [redacted]w[redacted]d  1. Supervision of high risk pregnancy, antepartum Doing well. Has TMJ. Breast are starting to produce milk. Reassurance given. Follow-up on supraclavicular node. Per patient present for a while but slightly increasing in size. Will likely need imaging and work-up s/p delivery.  2. Gestational trophoblastic neoplasm Will need repeat quant postpartum and placenta to pathology.   3. History of gestational hypertension BP wnl. Continue to monitor.   4. Iron deficiency anemia during pregnancy   Term labor symptoms and general obstetric precautions including but not limited to vaginal bleeding, contractions, leaking of fluid and fetal movement were reviewed in detail with the patient. Please refer to After Visit Summary for other counseling recommendations.  Return in about 1 week (around 09/16/2017) for ob visit.   Katheren Shams, DO

## 2017-09-09 NOTE — Patient Instructions (Signed)
Temporomandibular Joint Syndrome Temporomandibular joint (TMJ) syndrome is a condition that affects the joints between your jaw and your skull. The TMJs are located near your ears and allow your jaw to open and close. These joints and the nearby muscles are involved in all movements of the jaw. People with TMJ syndrome have pain in the area of these joints and muscles. Chewing, biting, or other movements of the jaw can be difficult or painful. TMJ syndrome can be caused by various things. In many cases, the condition is mild and goes away within a few weeks. For some people, the condition can become a long-term problem. What are the causes? Possible causes of TMJ syndrome include:  Grinding your teeth or clenching your jaw. Some people do this when they are under stress.  Arthritis.  Injury to the jaw.  Head or neck injury.  Teeth or dentures that are not aligned well.  In some cases, the cause of TMJ syndrome may not be known. What are the signs or symptoms? The most common symptom is an aching pain on the side of the head in the area of the TMJ. Other symptoms may include:  Pain when moving your jaw, such as when chewing or biting.  Being unable to open your jaw all the way.  Making a clicking sound when you open your mouth.  Headache.  Earache.  Neck or shoulder pain.  How is this diagnosed? Diagnosis can usually be made based on your symptoms, your medical history, and a physical exam. Your health care provider may check the range of motion of your jaw. Imaging tests, such as X-rays or an MRI, are sometimes done. You may need to see your dentist to determine if your teeth and jaw are lined up correctly. How is this treated? TMJ syndrome often goes away on its own. If treatment is needed, the options may include:  Eating soft foods and applying ice or heat.  Medicines to relieve pain or inflammation.  Medicines to relax the muscles.  A splint, bite plate, or mouthpiece  to prevent teeth grinding or jaw clenching.  Relaxation techniques or counseling to help reduce stress.  Transcutaneous electrical nerve stimulation (TENS). This helps to relieve pain by applying an electrical current through the skin.  Acupuncture. This is sometimes helpful to relieve pain.  Jaw surgery. This is rarely needed.  Follow these instructions at home:  Take medicines only as directed by your health care provider.  Eat a soft diet if you are having trouble chewing.  Apply ice to the painful area. ? Put ice in a plastic bag. ? Place a towel between your skin and the bag. ? Leave the ice on for 20 minutes, 2-3 times a day.  Apply a warm compress to the painful area as directed.  Massage your jaw area and perform any jaw stretching exercises as recommended by your health care provider.  If you were given a mouthpiece or bite plate, wear it as directed.  Avoid foods that require a lot of chewing. Do not chew gum.  Keep all follow-up visits as directed by your health care provider. This is important. Contact a health care provider if:  You are having trouble eating.  You have new or worsening symptoms. Get help right away if:  Your jaw locks open or closed. This information is not intended to replace advice given to you by your health care provider. Make sure you discuss any questions you have with your health care provider. Document   Released: 04/02/2001 Document Revised: 03/07/2016 Document Reviewed: 02/10/2014 Elsevier Interactive Patient Education  2018 [employer] Inc.  

## 2017-09-12 ENCOUNTER — Ambulatory Visit (HOSPITAL_COMMUNITY)
Admission: RE | Admit: 2017-09-12 | Discharge: 2017-09-12 | Disposition: A | Discharge status: Home / Self Care | Payer: Medicaid Other | Source: Ambulatory Visit | Attending: Student | Admitting: Student

## 2017-09-12 ENCOUNTER — Encounter (HOSPITAL_COMMUNITY): Payer: Self-pay

## 2017-09-12 DIAGNOSIS — O2610 Low weight gain in pregnancy, unspecified trimester: Secondary | ICD-10-CM

## 2017-09-12 DIAGNOSIS — O0993 Supervision of high risk pregnancy, unspecified, third trimester: Secondary | ICD-10-CM | POA: Insufficient documentation

## 2017-09-12 DIAGNOSIS — O281 Abnormal biochemical finding on antenatal screening of mother: Secondary | ICD-10-CM | POA: Insufficient documentation

## 2017-09-12 DIAGNOSIS — O99019 Anemia complicating pregnancy, unspecified trimester: Secondary | ICD-10-CM

## 2017-09-12 DIAGNOSIS — O09293 Supervision of pregnancy with other poor reproductive or obstetric history, third trimester: Secondary | ICD-10-CM | POA: Insufficient documentation

## 2017-09-12 DIAGNOSIS — O0990 Supervision of high risk pregnancy, unspecified, unspecified trimester: Secondary | ICD-10-CM

## 2017-09-12 DIAGNOSIS — D509 Iron deficiency anemia, unspecified: Secondary | ICD-10-CM

## 2017-09-12 DIAGNOSIS — Z3A38 38 weeks gestation of pregnancy: Secondary | ICD-10-CM | POA: Diagnosis not present

## 2017-09-13 ENCOUNTER — Encounter: Payer: Self-pay | Admitting: Student

## 2017-09-13 DIAGNOSIS — O26849 Uterine size-date discrepancy, unspecified trimester: Secondary | ICD-10-CM | POA: Insufficient documentation

## 2017-09-15 ENCOUNTER — Ambulatory Visit (INDEPENDENT_AMBULATORY_CARE_PROVIDER_SITE_OTHER): Payer: Medicaid Other | Admitting: Obstetrics and Gynecology

## 2017-09-15 ENCOUNTER — Encounter: Payer: Self-pay | Admitting: Obstetrics and Gynecology

## 2017-09-15 VITALS — BP 108/69 | HR 94 | Wt 114.6 lb

## 2017-09-15 DIAGNOSIS — Z8759 Personal history of other complications of pregnancy, childbirth and the puerperium: Secondary | ICD-10-CM

## 2017-09-15 DIAGNOSIS — O019 Hydatidiform mole, unspecified: Secondary | ICD-10-CM

## 2017-09-15 DIAGNOSIS — O0990 Supervision of high risk pregnancy, unspecified, unspecified trimester: Secondary | ICD-10-CM

## 2017-09-15 NOTE — Patient Instructions (Signed)
Vaginal Delivery Vaginal delivery means that you will give birth by pushing your baby out of your birth canal (vagina). A team of health care providers will help you before, during, and after vaginal delivery. Birth experiences are unique for every woman and every pregnancy, and birth experiences vary depending on where you choose to give birth. What should I do to prepare for my baby's birth? Before your baby is born, it is important to talk with your health care provider about:  Your labor and delivery preferences. These may include: ? Medicines that you may be given. ? How you will manage your pain. This might include non-medical pain relief techniques or injectable pain relief such as epidural analgesia. ? How you and your baby will be monitored during labor and delivery. ? Who may be in the labor and delivery room with you. ? Your feelings about surgical delivery of your baby (cesarean delivery, or C-section) if this becomes necessary. ? Your feelings about receiving donated blood through an IV tube (blood transfusion) if this becomes necessary.  Whether you are able: ? To take pictures or videos of the birth. ? To eat during labor and delivery. ? To move around, walk, or change positions during labor and delivery.  What to expect after your baby is born, such as: ? Whether delayed umbilical cord clamping and cutting is offered. ? Who will care for your baby right after birth. ? Medicines or tests that may be recommended for your baby. ? Whether breastfeeding is supported in your hospital or birth center. ? How long you will be in the hospital or birth center.  How any medical conditions you have may affect your baby or your labor and delivery experience.  To prepare for your baby's birth, you should also:  Attend all of your health care visits before delivery (prenatal visits) as recommended by your health care provider. This is important.  Prepare your home for your baby's  arrival. Make sure that you have: ? Diapers. ? Baby clothing. ? Feeding equipment. ? Safe sleeping arrangements for you and your baby.  Install a car seat in your vehicle. Have your car seat checked by a certified car seat installer to make sure that it is installed safely.  Think about who will help you with your new baby at home for at least the first several weeks after delivery.  What can I expect when I arrive at the birth center or hospital? Once you are in labor and have been admitted into the hospital or birth center, your health care provider may:  Review your pregnancy history and any concerns you have.  Insert an IV tube into one of your veins. This is used to give you fluids and medicines.  Check your blood pressure, pulse, temperature, and heart rate (vital signs).  Check whether your bag of water (amniotic sac) has broken (ruptured).  Talk with you about your birth plan and discuss pain control options.  Monitoring Your health care provider may monitor your contractions (uterine monitoring) and your baby's heart rate (fetal monitoring). You may need to be monitored:  Often, but not continuously (intermittently).  All the time or for long periods at a time (continuously). Continuous monitoring may be needed if: ? You are taking certain medicines, such as medicine to relieve pain or make your contractions stronger. ? You have pregnancy or labor complications.  Monitoring may be done by:  Placing a special stethoscope or a handheld monitoring device on your abdomen to   check your baby's heartbeat, and feeling your abdomen for contractions. This method of monitoring does not continuously record your baby's heartbeat or your contractions.  Placing monitors on your abdomen (external monitors) to record your baby's heartbeat and the frequency and length of contractions. You may not have to wear external monitors all the time.  Placing monitors inside of your uterus  (internal monitors) to record your baby's heartbeat and the frequency, length, and strength of your contractions. ? Your health care provider may use internal monitors if he or she needs more information about the strength of your contractions or your baby's heart rate. ? Internal monitors are put in place by passing a thin, flexible wire through your vagina and into your uterus. Depending on the type of monitor, it may remain in your uterus or on your baby's head until birth. ? Your health care provider will discuss the benefits and risks of internal monitoring with you and will ask for your permission before inserting the monitors.  Telemetry. This is a type of continuous monitoring that can be done with external or internal monitors. Instead of having to stay in bed, you are able to move around during telemetry. Ask your health care provider if telemetry is an option for you.  Physical exam Your health care provider may perform a physical exam. This may include:  Checking whether your baby is positioned: ? With the head toward your vagina (head-down). This is most common. ? With the head toward the top of your uterus (head-up or breech). If your baby is in a breech position, your health care provider may try to turn your baby to a head-down position so you can deliver vaginally. If it does not seem that your baby can be born vaginally, your provider may recommend surgery to deliver your baby. In rare cases, you may be able to deliver vaginally if your baby is head-up (breech delivery). ? Lying sideways (transverse). Babies that are lying sideways cannot be delivered vaginally.  Checking your cervix to determine: ? Whether it is thinning out (effacing). ? Whether it is opening up (dilating). ? How low your baby has moved into your birth canal.  What are the three stages of labor and delivery?  Normal labor and delivery is divided into the following three stages: Stage 1  Stage 1 is the  longest stage of labor, and it can last for hours or days. Stage 1 includes: ? Early labor. This is when contractions may be irregular, or regular and mild. Generally, early labor contractions are more than 10 minutes apart. ? Active labor. This is when contractions get longer, more regular, more frequent, and more intense. ? The transition phase. This is when contractions happen very close together, are very intense, and may last longer than during any other part of labor.  Contractions generally feel mild, infrequent, and irregular at first. They get stronger, more frequent (about every 2-3 minutes), and more regular as you progress from early labor through active labor and transition.  Many women progress through stage 1 naturally, but you may need help to continue making progress. If this happens, your health care provider may talk with you about: ? Rupturing your amniotic sac if it has not ruptured yet. ? Giving you medicine to help make your contractions stronger and more frequent.  Stage 1 ends when your cervix is completely dilated to 4 inches (10 cm) and completely effaced. This happens at the end of the transition phase. Stage 2  Once   your cervix is completely effaced and dilated to 4 inches (10 cm), you may start to feel an urge to push. It is common for the body to naturally take a rest before feeling the urge to push, especially if you received an epidural or certain other pain medicines. This rest period may last for up to 1-2 hours, depending on your unique labor experience.  During stage 2, contractions are generally less painful, because pushing helps relieve contraction pain. Instead of contraction pain, you may feel stretching and burning pain, especially when the widest part of your baby's head passes through the vaginal opening (crowning).  Your health care provider will closely monitor your pushing progress and your baby's progress through the vagina during stage 2.  Your  health care provider may massage the area of skin between your vaginal opening and anus (perineum) or apply warm compresses to your perineum. This helps it stretch as the baby's head starts to crown, which can help prevent perineal tearing. ? In some cases, an incision may be made in your perineum (episiotomy) to allow the baby to pass through the vaginal opening. An episiotomy helps to make the opening of the vagina larger to allow more room for the baby to fit through.  It is very important to breathe and focus so your health care provider can control the delivery of your baby's head. Your health care provider may have you decrease the intensity of your pushing, to help prevent perineal tearing.  After delivery of your baby's head, the shoulders and the rest of the body generally deliver very quickly and without difficulty.  Once your baby is delivered, the umbilical cord may be cut right away, or this may be delayed for 1-2 minutes, depending on your baby's health. This may vary among health care providers, hospitals, and birth centers.  If you and your baby are healthy enough, your baby may be placed on your chest or abdomen to help maintain the baby's temperature and to help you bond with each other. Some mothers and babies start breastfeeding at this time. Your health care team will dry your baby and help keep your baby warm during this time.  Your baby may need immediate care if he or she: ? Showed signs of distress during labor. ? Has a medical condition. ? Was born too early (prematurely). ? Had a bowel movement before birth (meconium). ? Shows signs of difficulty transitioning from being inside the uterus to being outside of the uterus. If you are planning to breastfeed, your health care team will help you begin a feeding. Stage 3  The third stage of labor starts immediately after the birth of your baby and ends after you deliver the placenta. The placenta is an organ that develops  during pregnancy to provide oxygen and nutrients to your baby in the womb.  Delivering the placenta may require some pushing, and you may have mild contractions. Breastfeeding can stimulate contractions to help you deliver the placenta.  After the placenta is delivered, your uterus should tighten (contract) and become firm. This helps to stop bleeding in your uterus. To help your uterus contract and to control bleeding, your health care provider may: ? Give you medicine by injection, through an IV tube, by mouth, or through your rectum (rectally). ? Massage your abdomen or perform a vaginal exam to remove any blood clots that are left in your uterus. ? Empty your bladder by placing a thin, flexible tube (catheter) into your bladder. ? Encourage   you to breastfeed your baby. After labor is over, you and your baby will be monitored closely to ensure that you are both healthy until you are ready to go home. Your health care team will teach you how to care for yourself and your baby. This information is not intended to replace advice given to you by your health care provider. Make sure you discuss any questions you have with your health care provider. Document Released: 04/16/2008 Document Revised: 01/26/2016 Document Reviewed: 07/23/2015 Elsevier Interactive Patient Education  2018 [employer] Inc.  

## 2017-09-15 NOTE — Progress Notes (Signed)
Subjective:  Shelly Koch is a 27 y.o. G2P1001 at [redacted]w[redacted]d being seen today for ongoing prenatal care.  She is currently monitored for the following issues for this high-risk pregnancy and has Elevated DSR; History of poor fetal growth; Gestational trophoblastic neoplasm; Environmental and seasonal allergies; Supervision of high risk pregnancy, antepartum; History of gestational hypertension; Poor weight gain of pregnancy, unspecified trimester; Iron deficiency anemia during pregnancy; and Uterine size date discrepancy on their problem list.  Patient reports general discomforts of pregnancy.  Contractions: Irregular. Vag. Bleeding: None.  Movement: Present. Denies leaking of fluid.   The following portions of the patient's history were reviewed and updated as appropriate: allergies, current medications, past family history, past medical history, past social history, past surgical history and problem list. Problem list updated.  Objective:   Vitals:   09/15/17 1313  BP: 108/69  Pulse: 94  Weight: 114 lb 9.6 oz (52 kg)    Fetal Status: Fetal Heart Rate (bpm): 162   Movement: Present     General:  Alert, oriented and cooperative. Patient is in no acute distress.  Skin: Skin is warm and dry. No rash noted.   Cardiovascular: Normal heart rate noted  Respiratory: Normal respiratory effort, no problems with respiration noted  Abdomen: Soft, gravid, appropriate for gestational age. Pain/Pressure: Present     Pelvic:  Cervical exam deferred        Extremities: Normal range of motion.  Edema: Trace  Mental Status: Normal mood and affect. Normal behavior. Normal judgment and thought content.   Urinalysis:      Assessment and Plan:  Pregnancy: G2P1001 at [redacted]w[redacted]d  1. Supervision of high risk pregnancy, antepartum Stable Labor precautions  2. History of gestational hypertension BP stable No evidence presently  3. Gestational trophoblastic neoplasm Placenta to pathology after delivery and  follow up BHCG after delivery  Term labor symptoms and general obstetric precautions including but not limited to vaginal bleeding, contractions, leaking of fluid and fetal movement were reviewed in detail with the patient. Please refer to After Visit Summary for other counseling recommendations.  Return in about 1 week (around 09/22/2017) for OB visit.   Chancy Milroy, MD

## 2017-09-17 ENCOUNTER — Inpatient Hospital Stay (HOSPITAL_COMMUNITY): Payer: Medicaid Other | Admitting: Anesthesiology

## 2017-09-17 ENCOUNTER — Encounter (HOSPITAL_COMMUNITY): Payer: Self-pay | Admitting: *Deleted

## 2017-09-17 ENCOUNTER — Other Ambulatory Visit: Payer: Self-pay

## 2017-09-17 ENCOUNTER — Inpatient Hospital Stay (HOSPITAL_COMMUNITY)
Admission: AD | Admit: 2017-09-17 | Discharge: 2017-09-19 | DRG: 807 | Disposition: A | Discharge status: Home / Self Care | Payer: Medicaid Other | Source: Ambulatory Visit | Attending: Family Medicine | Admitting: Family Medicine

## 2017-09-17 DIAGNOSIS — Z3A38 38 weeks gestation of pregnancy: Secondary | ICD-10-CM

## 2017-09-17 DIAGNOSIS — O0990 Supervision of high risk pregnancy, unspecified, unspecified trimester: Secondary | ICD-10-CM

## 2017-09-17 DIAGNOSIS — O4292 Full-term premature rupture of membranes, unspecified as to length of time between rupture and onset of labor: Principal | ICD-10-CM | POA: Diagnosis present

## 2017-09-17 DIAGNOSIS — D509 Iron deficiency anemia, unspecified: Secondary | ICD-10-CM | POA: Diagnosis present

## 2017-09-17 DIAGNOSIS — O2610 Low weight gain in pregnancy, unspecified trimester: Secondary | ICD-10-CM | POA: Diagnosis present

## 2017-09-17 DIAGNOSIS — Z87898 Personal history of other specified conditions: Secondary | ICD-10-CM

## 2017-09-17 DIAGNOSIS — O280 Abnormal hematological finding on antenatal screening of mother: Secondary | ICD-10-CM | POA: Diagnosis present

## 2017-09-17 DIAGNOSIS — Z8759 Personal history of other complications of pregnancy, childbirth and the puerperium: Secondary | ICD-10-CM

## 2017-09-17 DIAGNOSIS — O26843 Uterine size-date discrepancy, third trimester: Secondary | ICD-10-CM | POA: Diagnosis present

## 2017-09-17 DIAGNOSIS — O99019 Anemia complicating pregnancy, unspecified trimester: Secondary | ICD-10-CM

## 2017-09-17 LAB — CBC
HCT: 34.1 % — ABNORMAL LOW (ref 36.0–46.0)
HEMATOCRIT: 36 % (ref 36.0–46.0)
HEMATOCRIT: 36.4 % (ref 36.0–46.0)
HEMOGLOBIN: 12.2 g/dL (ref 12.0–15.0)
HEMOGLOBIN: 11.7 g/dL — AB (ref 12.0–15.0)
Hemoglobin: 12.2 g/dL (ref 12.0–15.0)
MCH: 33.4 pg (ref 26.0–34.0)
MCH: 33.2 pg (ref 26.0–34.0)
MCH: 33.4 pg (ref 26.0–34.0)
MCHC: 33.9 g/dL (ref 30.0–36.0)
MCHC: 33.5 g/dL (ref 30.0–36.0)
MCHC: 34.3 g/dL (ref 30.0–36.0)
MCV: 98.6 fL (ref 78.0–100.0)
MCV: 99.2 fL (ref 78.0–100.0)
MCV: 97.4 fL (ref 78.0–100.0)
Platelets: 95 10*3/uL — ABNORMAL LOW (ref 150–400)
Platelets: 93 10*3/uL — ABNORMAL LOW (ref 150–400)
Platelets: 94 10*3/uL — ABNORMAL LOW (ref 150–400)
RBC: 3.65 MIL/uL — AB (ref 3.87–5.11)
RBC: 3.67 MIL/uL — ABNORMAL LOW (ref 3.87–5.11)
RBC: 3.5 MIL/uL — AB (ref 3.87–5.11)
RDW: 13.5 % (ref 11.5–15.5)
RDW: 13.5 % (ref 11.5–15.5)
RDW: 13.3 % (ref 11.5–15.5)
WBC: 10.7 10*3/uL — ABNORMAL HIGH (ref 4.0–10.5)
WBC: 14.7 10*3/uL — ABNORMAL HIGH (ref 4.0–10.5)
WBC: 19.3 10*3/uL — ABNORMAL HIGH (ref 4.0–10.5)

## 2017-09-17 LAB — COMPREHENSIVE METABOLIC PANEL
ALBUMIN: 2.7 g/dL — AB (ref 3.5–5.0)
ALT: 16 U/L (ref 14–54)
ANION GAP: 10 (ref 5–15)
AST: 41 U/L (ref 15–41)
Alkaline Phosphatase: 207 U/L — ABNORMAL HIGH (ref 38–126)
BILIRUBIN TOTAL: 0.7 mg/dL (ref 0.3–1.2)
BUN: <5 mg/dL — ABNORMAL LOW (ref 6–20)
CO2: 19 mmol/L — ABNORMAL LOW (ref 22–32)
Calcium: 8.8 mg/dL — ABNORMAL LOW (ref 8.9–10.3)
Chloride: 104 mmol/L (ref 101–111)
Creatinine, Ser: 0.43 mg/dL — ABNORMAL LOW (ref 0.44–1.00)
GLUCOSE: 91 mg/dL (ref 65–99)
POTASSIUM: 4.2 mmol/L (ref 3.5–5.1)
Sodium: 133 mmol/L — ABNORMAL LOW (ref 135–145)
Total Protein: 6.3 g/dL — ABNORMAL LOW (ref 6.5–8.1)

## 2017-09-17 LAB — TYPE AND SCREEN
ABO/RH(D): AB POS
ANTIBODY SCREEN: NEGATIVE

## 2017-09-17 LAB — RPR: RPR: NONREACTIVE

## 2017-09-17 LAB — PROTEIN / CREATININE RATIO, URINE
Creatinine, Urine: 59 mg/dL
Protein Creatinine Ratio: 0.61 mg/mg{Cre} — ABNORMAL HIGH (ref 0.00–0.15)
TOTAL PROTEIN, URINE: 36 mg/dL

## 2017-09-17 LAB — POCT FERN TEST: POCT FERN TEST: POSITIVE

## 2017-09-17 MED ORDER — ACETAMINOPHEN 325 MG PO TABS
650.0000 mg | ORAL_TABLET | ORAL | Status: DC | PRN
  Administered 2017-09-17: 650 mg via ORAL
  Filled 2017-09-17: qty 2

## 2017-09-17 MED ORDER — PHENYLEPHRINE 40 MCG/ML (10ML) SYRINGE FOR IV PUSH (FOR BLOOD PRESSURE SUPPORT)
80.0000 ug | PREFILLED_SYRINGE | INTRAVENOUS | Status: DC | PRN
  Filled 2017-09-17: qty 5

## 2017-09-17 MED ORDER — FENTANYL CITRATE (PF) 100 MCG/2ML IJ SOLN
INTRAMUSCULAR | Status: AC
  Filled 2017-09-17: qty 2

## 2017-09-17 MED ORDER — SIMETHICONE 80 MG PO CHEW: 80.0000 mg | CHEWABLE_TABLET | ORAL | Status: DC | PRN

## 2017-09-17 MED ORDER — OXYTOCIN 40 UNITS IN LACTATED RINGERS INFUSION - SIMPLE MED
1.0000 m[IU]/min | INTRAVENOUS | Status: DC
  Administered 2017-09-17: 2 m[IU]/min via INTRAVENOUS
  Filled 2017-09-17: qty 1000

## 2017-09-17 MED ORDER — OXYCODONE-ACETAMINOPHEN 5-325 MG PO TABS: 1.0000 | ORAL_TABLET | ORAL | Status: DC | PRN

## 2017-09-17 MED ORDER — OXYTOCIN 40 UNITS IN LACTATED RINGERS INFUSION - SIMPLE MED
2.5000 [IU]/h | INTRAVENOUS | Status: DC
  Filled 2017-09-17: qty 1000

## 2017-09-17 MED ORDER — MISOPROSTOL 200 MCG PO TABS
800.0000 ug | ORAL_TABLET | Freq: Once | ORAL | Status: AC
  Administered 2017-09-17: 800 ug via ORAL
  Filled 2017-09-17: qty 4

## 2017-09-17 MED ORDER — EPHEDRINE 5 MG/ML INJ
10.0000 mg | INTRAVENOUS | Status: DC | PRN
  Filled 2017-09-17: qty 2

## 2017-09-17 MED ORDER — METHYLERGONOVINE MALEATE 0.2 MG/ML IJ SOLN
INTRAMUSCULAR | Status: AC
  Filled 2017-09-17: qty 1

## 2017-09-17 MED ORDER — COCONUT OIL OIL: 1.0000 "application " | TOPICAL_OIL | Status: DC | PRN

## 2017-09-17 MED ORDER — DIPHENHYDRAMINE HCL 50 MG/ML IJ SOLN: 12.5000 mg | INTRAMUSCULAR | Status: DC | PRN

## 2017-09-17 MED ORDER — OXYCODONE-ACETAMINOPHEN 5-325 MG PO TABS: 2.0000 | ORAL_TABLET | ORAL | Status: DC | PRN

## 2017-09-17 MED ORDER — LIDOCAINE HCL (PF) 1 % IJ SOLN
30.0000 mL | INTRAMUSCULAR | Status: DC | PRN
  Administered 2017-09-17: 30 mL via SUBCUTANEOUS
  Filled 2017-09-17: qty 30

## 2017-09-17 MED ORDER — CEFAZOLIN SODIUM-DEXTROSE 2-4 GM/100ML-% IV SOLN
2.0000 g | Freq: Once | INTRAVENOUS | Status: AC
  Administered 2017-09-17: 2 g via INTRAVENOUS
  Filled 2017-09-17: qty 100

## 2017-09-17 MED ORDER — SOD CITRATE-CITRIC ACID 500-334 MG/5ML PO SOLN: 30.0000 mL | ORAL | Status: DC | PRN

## 2017-09-17 MED ORDER — ZOLPIDEM TARTRATE 5 MG PO TABS: 5.0000 mg | ORAL_TABLET | Freq: Every evening | ORAL | Status: DC | PRN

## 2017-09-17 MED ORDER — ONDANSETRON HCL 4 MG/2ML IJ SOLN: 4.0000 mg | INTRAMUSCULAR | Status: DC | PRN

## 2017-09-17 MED ORDER — LACTATED RINGERS IV SOLN: 500.0000 mL | INTRAVENOUS | Status: DC | PRN

## 2017-09-17 MED ORDER — DIPHENHYDRAMINE HCL 25 MG PO CAPS: 25.0000 mg | ORAL_CAPSULE | Freq: Four times a day (QID) | ORAL | Status: DC | PRN

## 2017-09-17 MED ORDER — TERBUTALINE SULFATE 1 MG/ML IJ SOLN
0.2500 mg | Freq: Once | INTRAMUSCULAR | Status: DC | PRN
  Filled 2017-09-17: qty 1

## 2017-09-17 MED ORDER — LACTATED RINGERS IV SOLN: INTRAVENOUS | Status: DC

## 2017-09-17 MED ORDER — ONDANSETRON HCL 4 MG PO TABS
4.0000 mg | ORAL_TABLET | ORAL | Status: DC | PRN
Start: 2017-09-17 — End: 2017-09-17

## 2017-09-17 MED ORDER — METHYLERGONOVINE MALEATE 0.2 MG/ML IJ SOLN
0.2000 mg | Freq: Once | INTRAMUSCULAR | Status: AC
  Administered 2017-09-17: 0.2 mg via INTRAMUSCULAR

## 2017-09-17 MED ORDER — WITCH HAZEL-GLYCERIN EX PADS: 1.0000 "application " | MEDICATED_PAD | CUTANEOUS | Status: DC | PRN

## 2017-09-17 MED ORDER — IBUPROFEN 600 MG PO TABS
600.0000 mg | ORAL_TABLET | Freq: Four times a day (QID) | ORAL | Status: DC
  Administered 2017-09-17 – 2017-09-19 (×8): 600 mg via ORAL
  Filled 2017-09-17 (×8): qty 1

## 2017-09-17 MED ORDER — FENTANYL 2.5 MCG/ML BUPIVACAINE 1/10 % EPIDURAL INFUSION (WH - ANES): 14.0000 mL/h | INTRAMUSCULAR | Status: DC | PRN

## 2017-09-17 MED ORDER — TETANUS-DIPHTH-ACELL PERTUSSIS 5-2.5-18.5 LF-MCG/0.5 IM SUSP: 0.5000 mL | Freq: Once | INTRAMUSCULAR | Status: DC

## 2017-09-17 MED ORDER — LACTATED RINGERS IV SOLN: 500.0000 mL | Freq: Once | INTRAVENOUS | Status: DC

## 2017-09-17 MED ORDER — BENZOCAINE-MENTHOL 20-0.5 % EX AERO
1.0000 "application " | INHALATION_SPRAY | CUTANEOUS | Status: DC | PRN
  Administered 2017-09-17: 1 via TOPICAL
  Filled 2017-09-17: qty 56

## 2017-09-17 MED ORDER — DIBUCAINE 1 % RE OINT: 1.0000 "application " | TOPICAL_OINTMENT | RECTAL | Status: DC | PRN

## 2017-09-17 MED ORDER — FENTANYL CITRATE (PF) 100 MCG/2ML IJ SOLN
100.0000 ug | INTRAMUSCULAR | Status: DC | PRN
  Administered 2017-09-17 (×2): 100 ug via INTRAVENOUS
  Filled 2017-09-17 (×2): qty 2

## 2017-09-17 MED ORDER — PRENATAL MULTIVITAMIN CH
1.0000 | ORAL_TABLET | Freq: Every day | ORAL | Status: DC
  Administered 2017-09-18 – 2017-09-19 (×2): 1 via ORAL
  Filled 2017-09-17 (×2): qty 1

## 2017-09-17 MED ORDER — OXYTOCIN BOLUS FROM INFUSION
500.0000 mL | Freq: Once | INTRAVENOUS | Status: AC
  Administered 2017-09-17: 500 mL via INTRAVENOUS

## 2017-09-17 MED ORDER — ACETAMINOPHEN 325 MG PO TABS
650.0000 mg | ORAL_TABLET | ORAL | Status: DC | PRN
  Administered 2017-09-17 – 2017-09-19 (×4): 650 mg via ORAL
  Filled 2017-09-17 (×5): qty 2

## 2017-09-17 MED ORDER — SENNOSIDES-DOCUSATE SODIUM 8.6-50 MG PO TABS
2.0000 | ORAL_TABLET | ORAL | Status: DC
  Administered 2017-09-17 – 2017-09-19 (×2): 2 via ORAL
  Filled 2017-09-17 (×2): qty 2

## 2017-09-17 MED ORDER — FENTANYL CITRATE (PF) 100 MCG/2ML IJ SOLN
INTRAMUSCULAR | Status: DC | PRN
  Administered 2017-09-17: 100 ug via INTRAVENOUS

## 2017-09-17 NOTE — MAU Note (Signed)
Pt reports leaking pink fluid for 2 hours. Reports contractions every 3-4 mins. Pt denies vaginal bleeding. Reports good fetal movement. States cervix has not been examined recently.

## 2017-09-17 NOTE — Progress Notes (Signed)
Spoke to [employer] CNM regarding pt's ibuprofen order and platelet count of 93,000. Per CNM, okay to have ibuprofen. Also discussed pt's intermittent c/o dizziness and nausea/vomiting. VSS, fundus firm 1 below umbilicus, lochia scant/small.

## 2017-09-17 NOTE — Progress Notes (Signed)
C/o dizziness and nausea in bed. BP 118/67. Pt states she needs to void, refuses bedpan. Bedside commode used and pt tolerated well and was able to void. Instructed to not get out of bed without assistance.

## 2017-09-17 NOTE — Progress Notes (Signed)
CNM called to bedside for increased bleeding. Bladder emptied and 2 large clots expressed. Fundus firm, one below. Will give 0.2 methergine IM and cytotec 800mg  PO. Continue to monitor bleeding.   Wende Mott, CNM 09/17/17 10:12 AM

## 2017-09-17 NOTE — Anesthesia Postprocedure Evaluation (Signed)
Anesthesia Post Note  Patient: Shelly Koch  Procedure(s) Performed: anesthesia for retained placenta     Patient location during evaluation: Mother Baby Anesthesia Type: Epidural Level of consciousness: awake and alert Pain management: pain level controlled Vital Signs Assessment: post-procedure vital signs reviewed and stable Respiratory status: spontaneous breathing, nonlabored ventilation and respiratory function stable Cardiovascular status: stable Postop Assessment: no headache, no backache and epidural receding Anesthetic complications: no    Last Vitals:  Vitals:   09/17/17 1101 09/17/17 1125  BP: 118/88 113/64  Pulse: (!) 154 (!) 106  Resp: 18 (!) 24  Temp: 37.7 C 37.7 C  SpO2:  99%    Last Pain:  Vitals:   09/17/17 1125  TempSrc: Oral  PainSc: 8    Pain Goal: Patients Stated Pain Goal: 2 (09/17/17 1125)               [name]

## 2017-09-17 NOTE — Anesthesia Preprocedure Evaluation (Signed)
Anesthesia Evaluation  Patient identified by MRN, date of birth, ID band Patient awake  Preop documentation limited or incomplete due to emergent nature of procedure.  Airway Mallampati: II  TM Distance: >3 FB Neck ROM: Full    Dental no notable dental hx.    Pulmonary neg pulmonary ROS,    Pulmonary exam normal breath sounds clear to auscultation       Cardiovascular negative cardio ROS Normal cardiovascular exam Rhythm:Regular Rate:Normal     Neuro/Psych    GI/Hepatic   Endo/Other    Renal/GU      Musculoskeletal   Abdominal   Peds  Hematology   Anesthesia Other Findings   Reproductive/Obstetrics                             Anesthesia Physical Anesthesia Plan  ASA: II  Anesthesia Plan: MAC   Post-op Pain Management:    Induction: Intravenous  PONV Risk Score and Plan:   Airway Management Planned: Mask, Natural Airway and Nasal Cannula  Additional Equipment:   Intra-op Plan:   Post-operative Plan:   Informed Consent:   Plan Discussed with:   Anesthesia Plan Comments:         Anesthesia Quick Evaluation

## 2017-09-17 NOTE — Anesthesia Pain Management Evaluation Note (Signed)
  [facility] Pain Management Visit Note  Patient: Shelly Koch, 27 y.o., female  "Hello I am a member of the anesthesia team at [facility]. We have an anesthesia team available at all times to provide care throughout the hospital, including epidural management and anesthesia for C-section. I don't know your plan for the delivery whether it a natural birth, water birth, IV sedation, nitrous supplementation, doula or epidural, but we want to meet your pain goals."   1.Was your pain managed to your expectations on prior hospitalizations?   No prior hospitalizations  2.What is your expectation for pain management during this hospitalization?     IV pain meds  3.How can we help you reach that goal?   Record the patient's initial score and the patient's pain goal.   Pain: 10  Pain Goal: 7 [facility] wants you to be able to say your pain was always managed very well.  Jabier Mutton 09/17/2017

## 2017-09-17 NOTE — Anesthesia Postprocedure Evaluation (Signed)
Anesthesia Post Note  Patient: Shelly Koch  Procedure(s) Performed: anesthesia for retained placenta     Patient location during evaluation: Mother Baby Anesthesia Type: MAC Level of consciousness: awake Pain management: pain level controlled Vital Signs Assessment: post-procedure vital signs reviewed and stable Respiratory status: spontaneous breathing and patient connected to nasal cannula oxygen Cardiovascular status: blood pressure returned to baseline and stable Anesthetic complications: no    Last Vitals:  Vitals:   09/17/17 1101 09/17/17 1125  BP: 118/88 113/64  Pulse: (!) 154 (!) 106  Resp: 18 (!) 24  Temp: 37.7 C 37.7 C  SpO2:  99%    Last Pain:  Vitals:   09/17/17 1125  TempSrc: Oral  PainSc: 8    Pain Goal: Patients Stated Pain Goal: 2 (09/17/17 1125)               [name]

## 2017-09-17 NOTE — H&P (Addendum)
Shelly Koch is a 27 y.o. female presenting for SROM at 2245 2/26. Reports clear to pink fluid. Minimal vaginal bleeding. Good fetal movement.   Received prenatal care at [facility].   Pregnancy complicated by: previous GTN s/p chemo, anemia, poor growth   OB History    Gravida Para Term Preterm AB Living   2 1 1     1    SAB TAB Ectopic Multiple Live Births         0 1     Past Medical History:  Diagnosis Date  . Anemia   . Gestational trophoblastic neoplasm   . PICC (peripherally inserted central catheter) in place 12/24/2014  . Preterm labor   . Pyelonephritis    Past Surgical History:  Procedure Laterality Date  . DILATION AND EVACUATION N/A 08/10/2014   Procedure: DILATATION AND EVACUATION;  Surgeon: Lavonia Drafts, MD;  Location: [facility];  Service: Gynecology;  Laterality: N/A;  . HYSTEROSCOPY N/A 08/10/2014   Procedure: HYSTEROSCOPY;  Surgeon: Lavonia Drafts, MD;  Location: [facility];  Service: Gynecology;  Laterality: N/A;   Family History: family history is not on file. Social History:  reports that  has never smoked. she has never used smokeless tobacco. She reports that she does not drink alcohol or use drugs.     Maternal Diabetes: No Genetic Screening: Abnormal:  Results: Elevated risk of Trisomy 21 Maternal Ultrasounds/Referrals: Abnormal:  Findings:   Other: poor fetal growth Fetal Ultrasounds or other Referrals:  None Maternal Substance Abuse:  No Significant Maternal Medications:  None Significant Maternal Lab Results:  None Other Comments:  hx of gestational trophoblastic neoplasm  Review of Systems  Constitutional: Negative for fever.  Eyes: Negative for double vision.  Respiratory: Negative for shortness of breath.   Cardiovascular: Negative for palpitations.  Neurological: Negative for headaches.   Maternal Medical History:  Reason for admission: Rupture of membranes.   Contractions: Onset was 3-5 hours ago.   Frequency: irregular.    Duration is approximately 30 seconds.   Perceived severity is mild.    Fetal activity: Perceived fetal activity is normal.   Last perceived fetal movement was within the past 12 hours.      Dilation: 2.5 Effacement (%): 50, 60 Station: -2 Exam by:: Maryagnes Amos, RN Blood pressure (!) 122/93, pulse (!) 109, temperature 97.8 F (36.6 C), temperature source Oral, resp. rate 17, height 4\' 10"  (1.473 m), weight 51.7 kg (114 lb), last menstrual period 11/30/2016, SpO2 100 %.   Fetal Exam Fetal Monitor Review: Baseline rate: 150.  Variability: moderate (6-25 bpm).   Pattern: accelerations present and no decelerations.    Fetal State Assessment: Category I - tracings are normal.     Physical Exam  Constitutional: She is oriented to person, place, and time. She appears well-developed and well-nourished.  HENT:  Head: Normocephalic and atraumatic.  Eyes: EOM are normal. Pupils are equal, round, and reactive to light.  Neck: Normal range of motion.  Cardiovascular: Normal rate.  Respiratory: Effort normal.  Musculoskeletal: Normal range of motion.  Neurological: She is alert and oriented to person, place, and time.  Skin: Skin is warm and dry.  Psychiatric: She has a normal mood and affect. Her behavior is normal.    Prenatal labs: ABO, Rh: AB/Positive/-- (08/15 1335) Antibody: Negative (08/15 1335) Rubella: 1.43 (08/15 1335) RPR: Non Reactive (12/21 0832)  HBsAg: Negative (08/15 1335)  HIV: Non Reactive (12/21 0832)  GBS:   NEGATIVE  Assessment/Plan: Admit to BS after SROM at  home > 2245 2/26, pink tinged.   Augment as needed. 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    Ralene Ok, MD 09/17/2017, 1:27 AM  OB  FELLOW HISTORY AND PHYSICAL ATTESTATION  I confirm that I have verified the information documented in the resident's note and that I have also personally reperformed the physical exam and all medical decision making activities. I agree with above documentation and have made edits as needed.   Luiz Blare, DO OB Fellow

## 2017-09-18 MED ORDER — OXYCODONE HCL 5 MG PO TABS
5.0000 mg | ORAL_TABLET | ORAL | Status: DC | PRN
Start: 2017-09-18 — End: 2017-09-19
  Administered 2017-09-18 (×2): 5 mg via ORAL
  Filled 2017-09-18 (×2): qty 1

## 2017-09-18 NOTE — Progress Notes (Signed)
Post Partum Day 1 Subjective: no complaints, up ad lib, voiding and tolerating PO Wants to pump and bottle feed until she is cleared by doctor to breastfeed (due to chemo)  Objective: Blood pressure 106/63, pulse 82, temperature 98.5 F (36.9 C), temperature source Oral, resp. rate 14, height 4\' 10"  (1.473 m), weight 114 lb (51.7 kg), last menstrual period 11/30/2016, SpO2 99 %, unknown if currently breastfeeding.  Physical Exam:  General: alert, cooperative and no distress Lochia: appropriate Uterine Fundus: firm Incision: n/a DVT Evaluation: No evidence of DVT seen on physical exam.  Recent Labs    09/17/17 0715 09/17/17 1627  HGB 12.2 11.7*  HCT 36.4 34.1*    Assessment/Plan: Plan for discharge tomorrow   LOS: 1 day   Hansel Feinstein 09/18/2017, 6:36 AM

## 2017-09-18 NOTE — Progress Notes (Signed)
Pt declines DEBP setup at this time. Spoke to [place] who suggested mom pump and store EBM.

## 2017-09-19 MED ORDER — SIMETHICONE 80 MG PO CHEW: 80.0000 mg | CHEWABLE_TABLET | ORAL | 0 refills | Status: DC | PRN

## 2017-09-19 MED ORDER — IBUPROFEN 600 MG PO TABS: 600.0000 mg | ORAL_TABLET | Freq: Four times a day (QID) | ORAL | 0 refills | Status: DC

## 2017-09-19 NOTE — Lactation Note (Signed)
This note was copied from a baby's chart. Lactation Consultation Note  Patient Name: Shelly Koch [id number] Date: 09/19/2017   Offered to help infant latch, but Mom declined. Mom chose to be shown how to use hand pump instead. Mom instructed how to use. Pump flange appropriate for her, but Mom finds pumping to be painful.    Mom's milk is coming to volume.   Matthias Hughs [facility] 09/19/2017, 4:50 PM

## 2017-09-19 NOTE — Progress Notes (Signed)
Shelly Koch was notified that patient wanted to breastfeed and was concerned about history of chemo 2 yrs ago. Dr. Kennon Rounds was notified and reviewed all notes including ones from oncologist and cleared patient to breastfeed. Lactation was consulted to see patient and reviewed hand pump with patient.

## 2017-09-19 NOTE — Discharge Summary (Signed)
OB Discharge Summary    Patient Name: Shelly Koch DOB: Dec 16, 1990 MRN: [id number] Date of admission: 09/17/2017  Delivering MD: Wende Mott )  Date of discharge: 09/19/2017  Admitting diagnosis: SROM Intrauterine pregnancy: [redacted]w[redacted]d    Secondary diagnosis:  Active Problems:   Patient Active Problem List   Diagnosis Date Noted  . Indication for care in labor or delivery 09/17/2017  . SVD (spontaneous vaginal delivery) 09/17/2017  . Uterine size date discrepancy 09/13/2017  . Poor weight gain of pregnancy, unspecified trimester 04/17/2017  . Iron deficiency anemia during pregnancy 04/17/2017  . Supervision of high risk pregnancy, antepartum 03/05/2017  . History of gestational hypertension 03/05/2017  . Environmental and seasonal allergies 10/18/2014  . 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 History of poor fetal growth 06/04/2014  . Elevated DSR    Additional problems:  Gestational trophoblastic neoplasm with prior pregnancy, s/p chemo     Discharge diagnosis: Term Pregnancy Delivered                                                                                                Post partum procedures:None  Complications: Manual placental extraction  Hospital course:  Onset of Labor With Vaginal Delivery     27 y.o. [id number] at [redacted]w[redacted]d was admitted in Latent Labor on 09/17/2017. Patient had a labor course as follows:  Membrane Rupture Time/Date: 10:45 PM ,09/16/2017   Intrapartum Procedures: Episiotomy: None [1]                                         Lacerations:  2nd degree [3]   Manual extraction of placenta, placenta intact. Patient had a delivery of a Viable infant. 09/17/2017  Information for the patient's newborn:  Rupal, Childress Girl Ammarie [[id number]]  Delivery Method: Vaginal, Spontaneous(Filed from Delivery Summary)    Pateint had an uncomplicated postpartum course.  She is ambulating, tolerating a regular diet, passing flatus, and urinating well. Patient is  discharged home in stable condition on 09/19/17.  Physical exam  Vitals:   09/18/17 1738 09/19/17 0625  BP: 117/67 112/64  Pulse: 89 81  Resp: 16 16  Temp: 97.9 F (36.6 C) 98.1 F (36.7 C)  SpO2:     General: alert, cooperative and no distress CV: regular rate and rhythm Lochia: appropriate Uterine Fundus: firm Incision: N/A DVT Evaluation: No evidence of DVT seen on physical exam.  Labs: No results found for this or any previous visit (from the past 24 hour(s)).  Discharge instruction: per After Visit Summary and "Baby and Me Booklet".  After visit meds:  Allergies  Allergen Reactions  . Okra     Hives and itching  . Ondansetron Hives and Itching  . Phenergan [Promethazine] Hives and Itching    Note: pt tolerates PO COMPAZINE  . Zofran [Ondansetron Hcl] Rash    Allergies as of 09/19/2017      Reactions   Okra    Hives and itching   Ondansetron Hives, Itching   Phenergan [promethazine] Hives, Itching  Note: pt tolerates PO COMPAZINE   Zofran [ondansetron Hcl] Rash      Medication List    TAKE these medications   acetaminophen 500 MG tablet Commonly known as:  TYLENOL Take 500 mg by mouth every 6 (six) hours as needed for mild pain or headache.   COMFORT FIT MATERNITY SUPP SM Misc 1 Units by Does not apply route daily as needed.   ibuprofen 600 MG tablet Commonly known as:  ADVIL,MOTRIN Take 1 tablet (600 mg total) by mouth every 6 (six) hours.   prenatal multivitamin Tabs tablet Take 1 tablet by mouth daily at 12 noon.   ranitidine 75 MG tablet Commonly known as:  ZANTAC Take 75 mg by mouth 2 (two) times daily.   simethicone 80 MG chewable tablet Commonly known as:  MYLICON Chew 1 tablet (80 mg total) by mouth as needed for flatulence.      Diet: routine diet  Activity: Advance as tolerated. Pelvic rest for 6 weeks.   Outpatient follow up:4 weeks Future Appointments:  Future Appointments  Date Time Provider [place]  10/30/2017   9:55 AM Ardean Larsen, Mervyn Skeeters, CNM [facility]   Follow up Appt: No Follow-up on file.  Postpartum contraception: Undecided  Newborn Data: APGAR (1 MIN): 9   APGAR (5 MINS): 9    Baby Feeding: Bottle. Would like to breastfeed. Is pumping and storing until cleared by doctor to breastfeed given recent chemo use. On WIC. Disposition:home with mother  Rory Percy, DO  09/19/2017

## 2017-09-19 NOTE — Discharge Instructions (Signed)
Vaginal Delivery, Care After °Refer to this sheet in the next few weeks. These instructions provide you with information about caring for yourself after vaginal delivery. Your health care provider may also give you more specific instructions. Your treatment has been planned according to current medical practices, but problems sometimes occur. Call your health care provider if you have any problems or questions. °What can I expect after the procedure? °After vaginal delivery, it is common to have: °· Some bleeding from your vagina. °· Soreness in your abdomen, your vagina, and the area of skin between your vaginal opening and your anus (perineum). °· Pelvic cramps. °· Fatigue. ° °Follow these instructions at home: °Medicines °· Take over-the-counter and prescription medicines only as told by your health care provider. °· If you were prescribed an antibiotic medicine, take it as told by your health care provider. Do not stop taking the antibiotic until it is finished. °Driving ° °· Do not drive or operate heavy machinery while taking prescription pain medicine. °· Do not drive for 24 hours if you received a sedative. °Lifestyle °· Do not drink alcohol. This is especially important if you are breastfeeding or taking medicine to relieve pain. °· Do not use tobacco products, including cigarettes, chewing tobacco, or e-cigarettes. If you need help quitting, ask your health care provider. °Eating and drinking °· Drink at least 8 eight-ounce glasses of water every day unless you are told not to by your health care provider. If you choose to breastfeed your baby, you may need to drink more water than this. °· Eat high-fiber foods every day. These foods may help prevent or relieve constipation. High-fiber foods include: °? Whole grain cereals and breads. °? Brown rice. °? Beans. °? Fresh fruits and vegetables. °Activity °· Return to your normal activities as told by your health care provider. Ask your health care provider  what activities are safe for you. °· Rest as much as possible. Try to rest or take a nap when your baby is sleeping. °· Do not lift anything that is heavier than your baby or 10 lb (4.5 kg) until your health care provider says that it is safe. °· Talk with your health care provider about when you can engage in sexual activity. This may depend on your: °? Risk of infection. °? Rate of healing. °? Comfort and desire to engage in sexual activity. °Vaginal Care °· If you have an episiotomy or a vaginal tear, check the area every day for signs of infection. Check for: °? More redness, swelling, or pain. °? More fluid or blood. °? Warmth. °? Pus or a bad smell. °· Do not use tampons or douches until your health care provider says this is safe. °· Watch for any blood clots that may pass from your vagina. These may look like clumps of dark red, brown, or black discharge. °General instructions °· Keep your perineum clean and dry as told by your health care provider. °· Wear loose, comfortable clothing. °· Wipe from front to back when you use the toilet. °· Ask your health care provider if you can shower or take a bath. If you had an episiotomy or a perineal tear during labor and delivery, your health care provider may tell you not to take baths for a certain length of time. °· Wear a bra that supports your breasts and fits you well. °· If possible, have someone help you with household activities and help care for your baby for at least a few days after   you leave the hospital. °· Keep all follow-up visits for you and your baby as told by your health care provider. This is important. °Contact a health care provider if: °· You have: °? Vaginal discharge that has a bad smell. °? Difficulty urinating. °? Pain when urinating. °? A sudden increase or decrease in the frequency of your bowel movements. °? More redness, swelling, or pain around your episiotomy or vaginal tear. °? More fluid or blood coming from your episiotomy or  vaginal tear. °? Pus or a bad smell coming from your episiotomy or vaginal tear. °? A fever. °? A rash. °? Little or no interest in activities you used to enjoy. °? Questions about caring for yourself or your baby. °· Your episiotomy or vaginal tear feels warm to the touch. °· Your episiotomy or vaginal tear is separating or does not appear to be healing. °· Your breasts are painful, hard, or turn red. °· You feel unusually sad or worried. °· You feel nauseous or you vomit. °· You pass large blood clots from your vagina. If you pass a blood clot from your vagina, save it to show to your health care provider. Do not flush blood clots down the toilet without having your health care provider look at them. °· You urinate more than usual. °· You are dizzy or light-headed. °· You have not breastfed at all and you have not had a menstrual period for 12 weeks after delivery. °· You have stopped breastfeeding and you have not had a menstrual period for 12 weeks after you stopped breastfeeding. °Get help right away if: °· You have: °? Pain that does not go away or does not get better with medicine. °? Chest pain. °? Difficulty breathing. °? Blurred vision or spots in your vision. °? Thoughts about hurting yourself or your baby. °· You develop pain in your abdomen or in one of your legs. °· You develop a severe headache. °· You faint. °· You bleed from your vagina so much that you fill two sanitary pads in one hour. °This information is not intended to replace advice given to you by your health care provider. Make sure you discuss any questions you have with your health care provider. °Document Released: 07/05/2000 Document Revised: 12/20/2015 Document Reviewed: 07/23/2015 °Elsevier Interactive Patient Education © 2018 [employer] ° °

## 2017-09-19 NOTE — Progress Notes (Signed)
Patient denies leg pain.

## 2017-09-24 ENCOUNTER — Other Ambulatory Visit: Payer: Medicaid Other

## 2017-09-24 ENCOUNTER — Encounter: Payer: Medicaid Other | Admitting: Medical

## 2017-09-24 DIAGNOSIS — O019 Hydatidiform mole, unspecified: Secondary | ICD-10-CM

## 2017-09-25 ENCOUNTER — Telehealth: Payer: Self-pay | Admitting: Family Medicine

## 2017-09-25 LAB — BETA HCG QUANT (REF LAB): hCG Quant: 127 m[IU]/mL

## 2017-09-25 NOTE — Telephone Encounter (Signed)
Patient is requesting a call from RN about pain after delivering baby.

## 2017-10-01 ENCOUNTER — Other Ambulatory Visit: Payer: Medicaid Other

## 2017-10-03 ENCOUNTER — Other Ambulatory Visit: Payer: Self-pay | Admitting: General Practice

## 2017-10-03 ENCOUNTER — Other Ambulatory Visit: Payer: Medicaid Other

## 2017-10-03 DIAGNOSIS — Z8759 Personal history of other complications of pregnancy, childbirth and the puerperium: Secondary | ICD-10-CM

## 2017-10-03 DIAGNOSIS — O019 Hydatidiform mole, unspecified: Secondary | ICD-10-CM

## 2017-10-04 LAB — BETA HCG QUANT (REF LAB): hCG Quant: 59 m[IU]/mL

## 2017-10-08 ENCOUNTER — Inpatient Hospital Stay (HOSPITAL_COMMUNITY): Payer: Medicaid Other

## 2017-10-08 ENCOUNTER — Other Ambulatory Visit: Payer: Medicaid Other

## 2017-10-08 ENCOUNTER — Encounter (HOSPITAL_COMMUNITY): Payer: Self-pay | Admitting: *Deleted

## 2017-10-08 ENCOUNTER — Other Ambulatory Visit: Payer: Self-pay

## 2017-10-08 ENCOUNTER — Encounter (HOSPITAL_COMMUNITY)
Admission: AD | Disposition: A | Discharge status: Home / Self Care | Payer: Self-pay | Source: Ambulatory Visit | Attending: Obstetrics and Gynecology

## 2017-10-08 ENCOUNTER — Inpatient Hospital Stay (HOSPITAL_COMMUNITY): Payer: Medicaid Other | Admitting: Certified Registered Nurse Anesthetist

## 2017-10-08 ENCOUNTER — Observation Stay (HOSPITAL_COMMUNITY)
Admission: AD | Admit: 2017-10-08 | Discharge: 2017-10-09 | Disposition: A | Discharge status: Home / Self Care | Payer: Medicaid Other | Source: Ambulatory Visit | Attending: Obstetrics and Gynecology | Admitting: Obstetrics and Gynecology

## 2017-10-08 DIAGNOSIS — K146 Glossodynia: Secondary | ICD-10-CM | POA: Diagnosis not present

## 2017-10-08 DIAGNOSIS — Z888 Allergy status to other drugs, medicaments and biological substances status: Secondary | ICD-10-CM | POA: Diagnosis not present

## 2017-10-08 DIAGNOSIS — R1032 Left lower quadrant pain: Secondary | ICD-10-CM | POA: Insufficient documentation

## 2017-10-08 DIAGNOSIS — D649 Anemia, unspecified: Secondary | ICD-10-CM | POA: Diagnosis not present

## 2017-10-08 DIAGNOSIS — R55 Syncope and collapse: Secondary | ICD-10-CM | POA: Diagnosis not present

## 2017-10-08 DIAGNOSIS — M549 Dorsalgia, unspecified: Secondary | ICD-10-CM | POA: Insufficient documentation

## 2017-10-08 HISTORY — PX: DILATION AND EVACUATION: SHX1459

## 2017-10-08 LAB — CBC
HCT: 34.4 % — ABNORMAL LOW (ref 36.0–46.0)
HCT: 35.5 % — ABNORMAL LOW (ref 36.0–46.0)
HCT: 24.6 % — ABNORMAL LOW (ref 36.0–46.0)
HCT: 19.6 % — ABNORMAL LOW (ref 36.0–46.0)
HEMOGLOBIN: 8.3 g/dL — AB (ref 12.0–15.0)
Hemoglobin: 11.2 g/dL — ABNORMAL LOW (ref 12.0–15.0)
Hemoglobin: 11.7 g/dL — ABNORMAL LOW (ref 12.0–15.0)
Hemoglobin: 6.8 g/dL — CL (ref 12.0–15.0)
MCH: 32.5 pg (ref 26.0–34.0)
MCH: 32.7 pg (ref 26.0–34.0)
MCH: 33.5 pg (ref 26.0–34.0)
MCH: 33.8 pg (ref 26.0–34.0)
MCHC: 32.6 g/dL (ref 30.0–36.0)
MCHC: 33 g/dL (ref 30.0–36.0)
MCHC: 33.7 g/dL (ref 30.0–36.0)
MCHC: 34.7 g/dL (ref 30.0–36.0)
MCV: 99.7 fL (ref 78.0–100.0)
MCV: 99.2 fL (ref 78.0–100.0)
MCV: 99.2 fL (ref 78.0–100.0)
MCV: 97.5 fL (ref 78.0–100.0)
PLATELETS: 172 10*3/uL (ref 150–400)
Platelets: 155 10*3/uL (ref 150–400)
Platelets: 149 10*3/uL — ABNORMAL LOW (ref 150–400)
Platelets: 121 10*3/uL — ABNORMAL LOW (ref 150–400)
RBC: 3.45 MIL/uL — ABNORMAL LOW (ref 3.87–5.11)
RBC: 3.58 MIL/uL — ABNORMAL LOW (ref 3.87–5.11)
RBC: 2.48 MIL/uL — ABNORMAL LOW (ref 3.87–5.11)
RBC: 2.01 MIL/uL — ABNORMAL LOW (ref 3.87–5.11)
RDW: 12.6 % (ref 11.5–15.5)
RDW: 12.5 % (ref 11.5–15.5)
RDW: 12.4 % (ref 11.5–15.5)
RDW: 12.3 % (ref 11.5–15.5)
WBC: 5.8 10*3/uL (ref 4.0–10.5)
WBC: 7.2 10*3/uL (ref 4.0–10.5)
WBC: 15.5 10*3/uL — ABNORMAL HIGH (ref 4.0–10.5)
WBC: 13.1 10*3/uL — ABNORMAL HIGH (ref 4.0–10.5)

## 2017-10-08 LAB — PREPARE RBC (CROSSMATCH)

## 2017-10-08 LAB — HCG, QUANTITATIVE, PREGNANCY: hCG, Beta Chain, Quant, S: 34 m[IU]/mL — ABNORMAL HIGH (ref <5)

## 2017-10-08 SURGERY — DILATION AND EVACUATION, UTERUS
Anesthesia: Monitor Anesthesia Care

## 2017-10-08 MED ORDER — DEXAMETHASONE SODIUM PHOSPHATE 4 MG/ML IJ SOLN
INTRAMUSCULAR | Status: AC
  Filled 2017-10-08: qty 1

## 2017-10-08 MED ORDER — LACTATED RINGERS IV SOLN
INTRAVENOUS | Status: DC | PRN
  Administered 2017-10-08: 11:00:00 via INTRAVENOUS

## 2017-10-08 MED ORDER — PROPOFOL 10 MG/ML IV BOLUS
INTRAVENOUS | Status: AC
  Filled 2017-10-08: qty 20

## 2017-10-08 MED ORDER — SODIUM CHLORIDE 0.9 % IV SOLN
Freq: Once | INTRAVENOUS | Status: AC
  Administered 2017-10-08: 18:00:00 via INTRAVENOUS

## 2017-10-08 MED ORDER — LACTATED RINGERS IV BOLUS (SEPSIS)
1000.0000 mL | Freq: Once | INTRAVENOUS | Status: AC
  Administered 2017-10-08: 1000 mL via INTRAVENOUS

## 2017-10-08 MED ORDER — FERROUS SULFATE 325 (65 FE) MG PO TABS: 325.0000 mg | ORAL_TABLET | Freq: Two times a day (BID) | ORAL | 1 refills | Status: DC

## 2017-10-08 MED ORDER — METHYLERGONOVINE MALEATE 0.2 MG PO TABS: 0.2000 mg | ORAL_TABLET | Freq: Four times a day (QID) | ORAL | 0 refills | Status: DC

## 2017-10-08 MED ORDER — FENTANYL CITRATE (PF) 100 MCG/2ML IJ SOLN
25.0000 ug | INTRAMUSCULAR | Status: DC | PRN
  Administered 2017-10-08: 50 ug via INTRAVENOUS
  Administered 2017-10-08: 25 ug via INTRAVENOUS

## 2017-10-08 MED ORDER — MIDAZOLAM HCL 2 MG/2ML IJ SOLN
INTRAMUSCULAR | Status: AC
  Filled 2017-10-08: qty 2

## 2017-10-08 MED ORDER — DOXYCYCLINE HYCLATE 100 MG IV SOLR
200.0000 mg | INTRAVENOUS | Status: AC
  Administered 2017-10-08: 200 mg via INTRAVENOUS
  Filled 2017-10-08: qty 200

## 2017-10-08 MED ORDER — DEXAMETHASONE SODIUM PHOSPHATE 10 MG/ML IJ SOLN
INTRAMUSCULAR | Status: DC | PRN
  Administered 2017-10-08: 4 mg via INTRAVENOUS

## 2017-10-08 MED ORDER — FAMOTIDINE IN NACL 20-0.9 MG/50ML-% IV SOLN
INTRAVENOUS | Status: AC
  Administered 2017-10-08: 20 mg
  Filled 2017-10-08: qty 50

## 2017-10-08 MED ORDER — FENTANYL CITRATE (PF) 100 MCG/2ML IJ SOLN
INTRAMUSCULAR | Status: DC | PRN
  Administered 2017-10-08: 100 ug via INTRAVENOUS

## 2017-10-08 MED ORDER — FENTANYL CITRATE (PF) 100 MCG/2ML IJ SOLN
INTRAMUSCULAR | Status: AC
  Administered 2017-10-08: 50 ug via INTRAVENOUS
  Filled 2017-10-08: qty 2

## 2017-10-08 MED ORDER — OXYCODONE HCL 5 MG PO TABS: 5.0000 mg | ORAL_TABLET | Freq: Once | ORAL | Status: DC | PRN

## 2017-10-08 MED ORDER — METHYLERGONOVINE MALEATE 0.2 MG/ML IJ SOLN
0.2000 mg | Freq: Once | INTRAMUSCULAR | Status: AC
  Administered 2017-10-08: 0.2 mg via INTRAMUSCULAR
  Filled 2017-10-08: qty 1

## 2017-10-08 MED ORDER — LACTATED RINGERS IV SOLN: INTRAVENOUS | Status: DC

## 2017-10-08 MED ORDER — METHYLERGONOVINE MALEATE 0.2 MG PO TABS
0.2000 mg | ORAL_TABLET | Freq: Four times a day (QID) | ORAL | Status: AC
  Administered 2017-10-08 – 2017-10-09 (×3): 0.2 mg via ORAL
  Filled 2017-10-08 (×3): qty 1

## 2017-10-08 MED ORDER — SOD CITRATE-CITRIC ACID 500-334 MG/5ML PO SOLN
ORAL | Status: AC
  Administered 2017-10-08: 30 mL
  Filled 2017-10-08: qty 15

## 2017-10-08 MED ORDER — METHYLERGONOVINE MALEATE 0.2 MG/ML IJ SOLN
INTRAMUSCULAR | Status: DC | PRN
  Administered 2017-10-08: 0.2 mg via INTRAMUSCULAR

## 2017-10-08 MED ORDER — MIDAZOLAM HCL 2 MG/2ML IJ SOLN
INTRAMUSCULAR | Status: DC | PRN
  Administered 2017-10-08: 2 mg via INTRAVENOUS

## 2017-10-08 MED ORDER — CHLOROPROCAINE HCL 1 % IJ SOLN
INTRAMUSCULAR | Status: AC
  Filled 2017-10-08: qty 30

## 2017-10-08 MED ORDER — KETOROLAC TROMETHAMINE 30 MG/ML IJ SOLN: 30.0000 mg | Freq: Once | INTRAMUSCULAR | Status: DC | PRN

## 2017-10-08 MED ORDER — 0.9 % SODIUM CHLORIDE (POUR BTL) OPTIME
TOPICAL | Status: DC | PRN
  Administered 2017-10-08: 1000 mL

## 2017-10-08 MED ORDER — LACTATED RINGERS IV SOLN
INTRAVENOUS | Status: DC
  Administered 2017-10-08: 13:00:00 via INTRAVENOUS

## 2017-10-08 MED ORDER — OXYCODONE-ACETAMINOPHEN 5-325 MG PO TABS: 1.0000 | ORAL_TABLET | Freq: Four times a day (QID) | ORAL | 0 refills | Status: DC | PRN

## 2017-10-08 MED ORDER — OXYCODONE-ACETAMINOPHEN 5-325 MG PO TABS
2.0000 | ORAL_TABLET | Freq: Once | ORAL | Status: AC
  Administered 2017-10-08: 2 via ORAL
  Filled 2017-10-08: qty 2

## 2017-10-08 MED ORDER — ACETAMINOPHEN 325 MG PO TABS: 325.0000 mg | ORAL_TABLET | ORAL | Status: DC | PRN

## 2017-10-08 MED ORDER — DOCUSATE SODIUM 100 MG PO CAPS: 100.0000 mg | ORAL_CAPSULE | Freq: Two times a day (BID) | ORAL | 2 refills | Status: DC | PRN

## 2017-10-08 MED ORDER — MEPERIDINE HCL 25 MG/ML IJ SOLN: 6.2500 mg | INTRAMUSCULAR | Status: DC | PRN

## 2017-10-08 MED ORDER — FENTANYL CITRATE (PF) 250 MCG/5ML IJ SOLN
INTRAMUSCULAR | Status: AC
  Filled 2017-10-08: qty 5

## 2017-10-08 MED ORDER — SOD CITRATE-CITRIC ACID 500-334 MG/5ML PO SOLN: 30.0000 mL | Freq: Once | ORAL | Status: DC

## 2017-10-08 MED ORDER — PROPOFOL 500 MG/50ML IV EMUL
INTRAVENOUS | Status: DC | PRN
  Administered 2017-10-08: 50 ug/kg/min via INTRAVENOUS

## 2017-10-08 MED ORDER — MISOPROSTOL 200 MCG PO TABS
800.0000 ug | ORAL_TABLET | Freq: Once | ORAL | Status: AC
  Administered 2017-10-08: 800 ug via RECTAL
  Filled 2017-10-08: qty 4

## 2017-10-08 MED ORDER — LIDOCAINE HCL (CARDIAC) 20 MG/ML IV SOLN
INTRAVENOUS | Status: AC
Start: 2017-10-08 — End: ?
  Filled 2017-10-08: qty 5

## 2017-10-08 MED ORDER — OXYCODONE HCL 5 MG/5ML PO SOLN: 5.0000 mg | Freq: Once | ORAL | Status: DC | PRN

## 2017-10-08 MED ORDER — LIDOCAINE HCL (CARDIAC) 20 MG/ML IV SOLN
INTRAVENOUS | Status: DC | PRN
  Administered 2017-10-08: 80 mg via INTRAVENOUS

## 2017-10-08 MED ORDER — ROCURONIUM BROMIDE 100 MG/10ML IV SOLN
INTRAVENOUS | Status: AC
  Filled 2017-10-08: qty 1

## 2017-10-08 MED ORDER — CHLOROPROCAINE HCL 1 % IJ SOLN
INTRAMUSCULAR | Status: DC | PRN
  Administered 2017-10-08: 10 mL

## 2017-10-08 MED ORDER — KETOROLAC TROMETHAMINE 30 MG/ML IJ SOLN
INTRAMUSCULAR | Status: AC
  Filled 2017-10-08: qty 1

## 2017-10-08 MED ORDER — ACETAMINOPHEN 500 MG PO TABS
1000.0000 mg | ORAL_TABLET | Freq: Once | ORAL | Status: AC
  Administered 2017-10-08: 1000 mg via ORAL
  Filled 2017-10-08: qty 2

## 2017-10-08 MED ORDER — PROPOFOL 500 MG/50ML IV EMUL
INTRAVENOUS | Status: DC | PRN
  Administered 2017-10-08: 50 mg via INTRAVENOUS

## 2017-10-08 MED ORDER — ONDANSETRON HCL 4 MG/2ML IJ SOLN: 4.0000 mg | Freq: Once | INTRAMUSCULAR | Status: DC | PRN

## 2017-10-08 MED ORDER — ONDANSETRON HCL 4 MG/2ML IJ SOLN
INTRAMUSCULAR | Status: AC
  Filled 2017-10-08: qty 2

## 2017-10-08 MED ORDER — PRENATAL MULTIVITAMIN CH
1.0000 | ORAL_TABLET | Freq: Every day | ORAL | Status: DC
  Administered 2017-10-09: 1 via ORAL
  Filled 2017-10-08: qty 1

## 2017-10-08 MED ORDER — ACETAMINOPHEN 160 MG/5ML PO SOLN: 325.0000 mg | ORAL | Status: DC | PRN

## 2017-10-08 MED ORDER — FAMOTIDINE IN NACL 20-0.9 MG/50ML-% IV SOLN: 20.0000 mg | Freq: Once | INTRAVENOUS | Status: DC

## 2017-10-08 SURGICAL SUPPLY — 19 items
CATH ROBINSON RED A/P 16FR (CATHETERS) ×2 IMPLANT
DECANTER SPIKE VIAL GLASS SM (MISCELLANEOUS) ×2 IMPLANT
GLOVE BIOGEL PI IND STRL 6.5 (GLOVE) ×1 IMPLANT
GLOVE BIOGEL PI IND STRL 7.0 (GLOVE) ×1 IMPLANT
GLOVE BIOGEL PI INDICATOR 6.5 (GLOVE) ×1
GLOVE BIOGEL PI INDICATOR 7.0 (GLOVE) ×1
GLOVE SURG SS PI 6.0 STRL IVOR (GLOVE) ×2 IMPLANT
GOWN STRL REUS W/TWL LRG LVL3 (GOWN DISPOSABLE) ×4 IMPLANT
KIT BERKELEY 1ST TRIMESTER 3/8 (MISCELLANEOUS) ×2 IMPLANT
NS IRRIG 1000ML POUR BTL (IV SOLUTION) ×2 IMPLANT
PACK VAGINAL MINOR WOMEN LF (CUSTOM PROCEDURE TRAY) ×2 IMPLANT
PAD OB MATERNITY 4.3X12.25 (PERSONAL CARE ITEMS) ×2 IMPLANT
PAD PREP 24X48 CUFFED NSTRL (MISCELLANEOUS) ×2 IMPLANT
SET BERKELEY SUCTION TUBING (SUCTIONS) ×2 IMPLANT
TOWEL OR 17X24 6PK STRL BLUE (TOWEL DISPOSABLE) ×4 IMPLANT
VACURETTE 10 RIGID CVD (CANNULA) ×2 IMPLANT
VACURETTE 7MM CVD STRL WRAP (CANNULA) IMPLANT
VACURETTE 8 RIGID CVD (CANNULA) IMPLANT
VACURETTE 9 RIGID CVD (CANNULA) IMPLANT

## 2017-10-08 NOTE — Progress Notes (Signed)
Patient ID: Shelly Koch, female   DOB: 1990/10/07, 27 y.o.   MRN: [id number] Patient seen in PACU and reports feeling well with minimal bleeding. She is eager to be discharged to be with her children. Upon being standing up at the bedside, patient reported feeling dizzy. She insisted on walking to the bathroom with assistance and experienced a syncopal episode. Decision was made to admit for blood transfusion. Upon arrival to the floor, she again experienced a syncopal episode when assisted to the bathroom. Patient reports feeling dizzy. She denies chest pain or shortness of breath  Vitals:   10/08/17 1515 10/08/17 1535  BP: 100/63 96/69  Pulse: 79 83  Resp: 19 18  Temp:  98.5 F (36.9 C)  SpO2: 100% 100%   GENERAL: Well-developed, well-nourished female in no acute distress.  LUNGS: Clear to auscultation bilaterally.  HEART: Regular rate and rhythm. ABDOMEN: Soft, nontender, nondistended. No organomegaly. PELVIC: Normal external female genitalia. Pad minimally stained EXTREMITIES: No cyanosis, clubbing, or edema, 2+ distal pulses.  A/P Patient s/p D&E for retained POC s/p SVD 3 weeks ago - stat cbc ordered - Patient to receive 3 units pRBC - post transfusion cbc - patient to use a bedpan or foley catheter until completion of blood transfusion

## 2017-10-08 NOTE — H&P (Signed)
HPI: Shelly Koch is a 27 y.o. G2P2002 3 weeks PP who presents to maternity admissions reporting vaginal bleeding, dizziness and abdominal pain. She delivered vaginally on 09/17/17 with avulsion of cord at placenta delivery. She reports heavy bleeding starting this morning at 0620. She reports changing her pad 4 times this past hour before arrival to MAU at 0730. She reports multiple clots being on her pads. Her heavy bleeding is associated with abdominal cramping, she rates cramping a 8/10- has taken 1200mg  of Ibuprofen this morning with no relief. She has one occurrence of dizziness around 0700- she has a hx of anemia and is not currently taking iron supplements. She denies vaginal itching/burning, urinary symptoms, h/a, n/v, or fever/chills.  She is being followed closely with HCG levels for a hx of gestational neoplasm with last pregnancy. Last HCG level on 3/15 was 59. Needs additional drawn week of 3/18 and week of 3/25.       Past Medical History:  Diagnosis Date  . Anemia   . Gestational trophoblastic neoplasm   . PICC (peripherally inserted central catheter) in place 12/24/2014  . Preterm labor   . Pyelonephritis    Past Surgical History:  Procedure Laterality Date  . DILATION AND EVACUATION N/A 08/10/2014   Procedure: DILATATION AND EVACUATION;  Surgeon: Lavonia Drafts, MD;  Location: [facility];  Service: Gynecology;  Laterality: N/A;  . HYSTEROSCOPY N/A 08/10/2014   Procedure: HYSTEROSCOPY;  Surgeon: Lavonia Drafts, MD;  Location: [facility];  Service: Gynecology;  Laterality: N/A;   Social History        Socioeconomic History  . Marital status: Single    Spouse name: Not on file  . Number of children: Not on file  . Years of education: Not on file  . Highest education level: Not on file  Social Needs  . Financial resource strain: Not on file  . Food insecurity - worry: Not on file  . Food insecurity - inability: Not on file  . Transportation needs -  medical: Not on file  . Transportation needs - non-medical: Not on file  Occupational History  . Not on file  Tobacco Use  . Smoking status: Never Smoker  . Smokeless tobacco: Never Used  Substance and Sexual Activity  . Alcohol use: No  . Drug use: No  . Sexual activity: Yes    Partners: Male    Birth control/protection: None  Other Topics Concern  . Not on file  Social History Narrative  . Not on file   No current facility-administered medications on file prior to encounter.          Current Outpatient Medications on File Prior to Encounter  Medication Sig Dispense Refill  . acetaminophen (TYLENOL) 500 MG tablet Take 500 mg by mouth every 6 (six) hours as needed for mild pain or headache.     Water engineer Bandages & Supports (COMFORT FIT MATERNITY SUPP SM) MISC 1 Units by Does not apply route daily as needed. 1 each 0  . ibuprofen (ADVIL,MOTRIN) 600 MG tablet Take 1 tablet (600 mg total) by mouth every 6 (six) hours. 30 tablet 0  . Prenatal Vit-Fe Fumarate-FA (PRENATAL MULTIVITAMIN) TABS tablet Take 1 tablet by mouth daily at 12 noon. 30 tablet 11  . ranitidine (ZANTAC) 75 MG tablet Take 75 mg by mouth 2 (two) times daily.    . simethicone (MYLICON) 80 MG chewable tablet Chew 1 tablet (80 mg total) by mouth as needed for flatulence. 30 tablet 0  Allergies  Allergen Reactions  . Okra     Hives and itching  . Ondansetron Hives and Itching  . Phenergan [Promethazine] Hives and Itching    Note: pt tolerates PO COMPAZINE  . Zofran [Ondansetron Hcl] Rash    ROS:  Review of Systems  Constitutional: Negative.   Respiratory: Negative.   Cardiovascular: Negative.   Gastrointestinal: Positive for abdominal pain. Negative for constipation, diarrhea, nausea and vomiting.  Genitourinary: Positive for vaginal bleeding. Negative for difficulty urinating, dysuria, frequency, pelvic pain, urgency and vaginal pain.  Musculoskeletal: Negative.   Neurological:  Positive for dizziness. Negative for weakness, light-headedness and headaches.  Psychiatric/Behavioral: Negative.    I have reviewed patient's Past Medical Hx, Surgical Hx, Family Hx, Social Hx, medications and allergies.   Physical Exam   Patient Vitals for the past 24 hrs:  BP Temp Temp src Pulse Resp SpO2 Height Weight  10/08/17 0727 112/62 - - 93 - - - -  10/08/17 0726 110/74 - - 78 - - - -  10/08/17 0724 107/66 - - 76 - 95 % - -  10/08/17 0718 122/82 98.9 F (37.2 C) Oral 73 15 98 % 4\' 10"  (1.473 m) 105 lb (47.6 kg)   Constitutional: Well-developed, well-nourished female in no acute distress.  Cardiovascular: normal rate Respiratory: normal effort GI: Abd soft, non-tender. Pos BS x 4 MS: Extremities nontender, no edema, normal ROM Neurologic: Alert and oriented x 4.  GU: Neg CVAT.  PELVIC EXAM: Cualiflower Cervix pink, visually open, without lesion, large amount of dark red bleeding actively bleeding from cervix- three 3cm clots removed from vagina upon placement of speculum, vaginal walls and external genitalia normal Bimanual exam: Cervix 1cm/long/high, firm, anterior, neg CMT, uterus tender, enlarged/ fundal height is U/3, adnexa without tenderness, enlargement, or mass  LAB RESULTS      Results for orders placed or performed during the hospital encounter of 10/08/17 (from the past 24 hour(s))  CBC     Status: Abnormal   Collection Time: 10/08/17  8:04 AM  Result Value Ref Range   WBC 5.8 4.0 - 10.5 K/uL   RBC 3.45 (L) 3.87 - 5.11 MIL/uL   Hemoglobin 11.2 (L) 12.0 - 15.0 g/dL   HCT 34.4 (L) 36.0 - 46.0 %   MCV 99.7 78.0 - 100.0 fL   MCH 32.5 26.0 - 34.0 pg   MCHC 32.6 30.0 - 36.0 g/dL   RDW 12.6 11.5 - 15.5 %   Platelets 155 150 - 400 K/uL    US Pelvic Complete With Transvaginal  Result Date: 10/08/2017 CLINICAL DATA:  Postpartum bleeding, 3 weeks post delivery, beta HCG 39 EXAM: TRANSABDOMINAL AND TRANSVAGINAL ULTRASOUND OF PELVIS TECHNIQUE:  Both transabdominal and transvaginal ultrasound examinations of the pelvis were performed. Transabdominal technique was performed for global imaging of the pelvis including uterus, ovaries, adnexal regions, and pelvic cul-de-sac. It was necessary to proceed with endovaginal exam following the transabdominal exam to further assess the endometrium. COMPARISON:  None FINDINGS: Uterus Measurements: 13.3 x 12.9 x 6.8 cm. No fibroids or other mass visualized. Endometrium Thickness: 36 mm. Thickened, heterogeneous, with associated color Doppler flow. Right ovary Measurements: 2.3 x 1.3 x 2.4 cm. Normal appearance/no adnexal mass. Left ovary Measurements: 2.7 x 1.5 x 2.0 cm. Normal appearance/no adnexal mass. Other findings No abnormal free fluid. IMPRESSION: Thickened, heterogeneous endometrium with associated color Doppler flow, measuring up to 3.6 cm. In this clinical setting, this appearance is highly suspicious for retained products of conception. Electronically Signed  By: Julian Hy M.D.   On: 10/08/2017 10:22     ASSESSMENT/ PLAN  27 yo G2P2 s/p SVD on 2/27 with ultrasound findings suspicious for retained products of conception - Patient with 2 near syncopal episodes in MAU - EBL estimated to be 1100 cc - Discussed surgical management with D&E Risks, benefits and alternatives were explained including but not limited to risks of bleeding, infection, uterine perforation and damage to adjacent organs. Patient verbalized understanding and all questions were answered

## 2017-10-08 NOTE — MAU Provider Note (Addendum)
Chief Complaint: Vaginal Bleeding; Dizziness; and Abdominal Pain  First Provider Initiated Contact with Patient 10/08/17 0746    SUBJECTIVE HPI: Shelly Koch is a 27 y.o. G2P2002 3 weeks PP who presents to maternity admissions reporting vaginal bleeding, dizziness and abdominal pain. She delivered vaginally on 09/17/17 with avulsion of cord at placenta delivery. She reports heavy bleeding starting this morning at 0620. She reports changing her pad 4 times this past hour before arrival to MAU at 0730. She reports multiple clots being on her pads. Her heavy bleeding is associated with abdominal cramping, she rates cramping a 8/10- has taken 1200mg  of Ibuprofen this morning with no relief. She has one occurrence of dizziness around 0700- she has a hx of anemia and is not currently taking iron supplements. She denies vaginal itching/burning, urinary symptoms, h/a, n/v, or fever/chills.  She is being followed closely with HCG levels for a hx of gestational neoplasm with last pregnancy. Last HCG level on 3/15 was 59. Needs additional drawn week of 3/18 and week of 3/25.   Past Medical History:  Diagnosis Date  . Anemia   . Gestational trophoblastic neoplasm   . PICC (peripherally inserted central catheter) in place 12/24/2014  . Preterm labor   . Pyelonephritis    Past Surgical History:  Procedure Laterality Date  . DILATION AND EVACUATION N/A 08/10/2014   Procedure: DILATATION AND EVACUATION;  Surgeon: Lavonia Drafts, MD;  Location: [facility];  Service: Gynecology;  Laterality: N/A;  . HYSTEROSCOPY N/A 08/10/2014   Procedure: HYSTEROSCOPY;  Surgeon: Lavonia Drafts, MD;  Location: [facility];  Service: Gynecology;  Laterality: N/A;   Social History   Socioeconomic History  . Marital status: Single    Spouse name: Not on file  . Number of children: Not on file  . Years of education: Not on file  . Highest education level: Not on file  Social Needs  . Financial resource strain: Not on  file  . Food insecurity - worry: Not on file  . Food insecurity - inability: Not on file  . Transportation needs - medical: Not on file  . Transportation needs - non-medical: Not on file  Occupational History  . Not on file  Tobacco Use  . Smoking status: Never Smoker  . Smokeless tobacco: Never Used  Substance and Sexual Activity  . Alcohol use: No  . Drug use: No  . Sexual activity: Yes    Partners: Male    Birth control/protection: None  Other Topics Concern  . Not on file  Social History Narrative  . Not on file   No current facility-administered medications on file prior to encounter.    Current Outpatient Medications on File Prior to Encounter  Medication Sig Dispense Refill  . acetaminophen (TYLENOL) 500 MG tablet Take 500 mg by mouth every 6 (six) hours as needed for mild pain or headache.     Water engineer Bandages & Supports (COMFORT FIT MATERNITY SUPP SM) MISC 1 Units by Does not apply route daily as needed. 1 each 0  . ibuprofen (ADVIL,MOTRIN) 600 MG tablet Take 1 tablet (600 mg total) by mouth every 6 (six) hours. 30 tablet 0  . Prenatal Vit-Fe Fumarate-FA (PRENATAL MULTIVITAMIN) TABS tablet Take 1 tablet by mouth daily at 12 noon. 30 tablet 11  . ranitidine (ZANTAC) 75 MG tablet Take 75 mg by mouth 2 (two) times daily.    . simethicone (MYLICON) 80 MG chewable tablet Chew 1 tablet (80 mg total) by mouth as needed for flatulence.  30 tablet 0   Allergies  Allergen Reactions  . Okra     Hives and itching  . Ondansetron Hives and Itching  . Phenergan [Promethazine] Hives and Itching    Note: pt tolerates PO COMPAZINE  . Zofran [Ondansetron Hcl] Rash    ROS:  Review of Systems  Constitutional: Negative.   Respiratory: Negative.   Cardiovascular: Negative.   Gastrointestinal: Positive for abdominal pain. Negative for constipation, diarrhea, nausea and vomiting.  Genitourinary: Positive for vaginal bleeding. Negative for difficulty urinating, dysuria, frequency,  pelvic pain, urgency and vaginal pain.  Musculoskeletal: Negative.   Neurological: Positive for dizziness. Negative for weakness, light-headedness and headaches.  Psychiatric/Behavioral: Negative.    I have reviewed patient's Past Medical Hx, Surgical Hx, Family Hx, Social Hx, medications and allergies.   Physical Exam   Patient Vitals for the past 24 hrs:  BP Temp Temp src Pulse Resp SpO2 Height Weight  10/08/17 1023 114/69 - - 73 - - - -  10/08/17 1011 (!) 110/46 - - 68 - - - -  10/08/17 0917 128/85 - - 91 - 99 % - -  10/08/17 0727 112/62 - - 93 - - - -  10/08/17 0726 110/74 - - 78 - - - -  10/08/17 0724 107/66 - - 76 - 95 % - -  10/08/17 0718 122/82 98.9 F (37.2 C) Oral 73 15 98 % 4\' 10"  (1.473 m) 105 lb (47.6 kg)   Constitutional: Well-developed, well-nourished female in no acute distress.  Cardiovascular: normal rate Respiratory: normal effort GI: Abd soft, non-tender. Pos BS x 4 MS: Extremities nontender, no edema, normal ROM Neurologic: Alert and oriented x 4.  GU: Neg CVAT.  PELVIC EXAM: Cualiflower Cervix pink, visually open, without lesion, large amount of dark red bleeding actively bleeding from cervix- three 3cm clots removed from vagina upon placement of speculum, vaginal walls and external genitalia normal Bimanual exam: Cervix 1cm/long/high, firm, anterior, neg CMT, uterus tender, enlarged/ fundal height is U/3, adnexa without tenderness, enlargement, or mass  LAB RESULTS Results for orders placed or performed during the hospital encounter of 10/08/17 (from the past 24 hour(s))  CBC     Status: Abnormal   Collection Time: 10/08/17  8:04 AM  Result Value Ref Range   WBC 5.8 4.0 - 10.5 K/uL   RBC 3.45 (L) 3.87 - 5.11 MIL/uL   Hemoglobin 11.2 (L) 12.0 - 15.0 g/dL   HCT 34.4 (L) 36.0 - 46.0 %   MCV 99.7 78.0 - 100.0 fL   MCH 32.5 26.0 - 34.0 pg   MCHC 32.6 30.0 - 36.0 g/dL   RDW 12.6 11.5 - 15.5 %   Platelets 155 150 - 400 K/uL  hCG, quantitative, pregnancy      Status: Abnormal   Collection Time: 10/08/17  8:04 AM  Result Value Ref Range   hCG, Beta Chain, Quant, S 34 (H) <5 mIU/mL  Type and screen     Status: None   Collection Time: 10/08/17  9:26 AM  Result Value Ref Range   ABO/RH(D) AB POS    Antibody Screen NEG    Sample Expiration      10/11/2017 Performed at [facility], [address], [place], [place] [address]   CBC     Status: Abnormal   Collection Time: 10/08/17  9:26 AM  Result Value Ref Range   WBC 7.2 4.0 - 10.5 K/uL   RBC 3.58 (L) 3.87 - 5.11 MIL/uL   Hemoglobin 11.7 (L) 12.0 -  15.0 g/dL   HCT 35.5 (L) 36.0 - 46.0 %   MCV 99.2 78.0 - 100.0 fL   MCH 32.7 26.0 - 34.0 pg   MCHC 33.0 30.0 - 36.0 g/dL   RDW 12.5 11.5 - 15.5 %   Platelets 172 150 - 400 K/uL    --/--/AB POS (03/20 0926)  US Pelvic Complete With Transvaginal  Result Date: 10/08/2017 CLINICAL DATA:  Postpartum bleeding, 3 weeks post delivery, beta HCG 39 EXAM: TRANSABDOMINAL AND TRANSVAGINAL ULTRASOUND OF PELVIS TECHNIQUE: Both transabdominal and transvaginal ultrasound examinations of the pelvis were performed. Transabdominal technique was performed for global imaging of the pelvis including uterus, ovaries, adnexal regions, and pelvic cul-de-sac. It was necessary to proceed with endovaginal exam following the transabdominal exam to further assess the endometrium. COMPARISON:  None FINDINGS: Uterus Measurements: 13.3 x 12.9 x 6.8 cm. No fibroids or other mass visualized. Endometrium Thickness: 36 mm. Thickened, heterogeneous, with associated color Doppler flow. Right ovary Measurements: 2.3 x 1.3 x 2.4 cm. Normal appearance/no adnexal mass. Left ovary Measurements: 2.7 x 1.5 x 2.0 cm. Normal appearance/no adnexal mass. Other findings No abnormal free fluid. IMPRESSION: Thickened, heterogeneous endometrium with associated color Doppler flow, measuring up to 3.6 cm. In this clinical setting, this appearance is highly suspicious for retained products of conception.  Electronically Signed   By: Julian Hy M.D.   On: 10/08/2017 10:22   MAU Management/MDM: Treatments in MAU included 1,000mg  Tylenol for pain management.  CBC- WNL  HCG- pending   Report given to [place], CNM at [facility]  Certified Nurse-Midwife 10/08/2017  11:14 AM  [place] ordered  3300 Methergine given, Dr. Elly Modena updated with plan 1010: syncope episode, EBL 1140, Cytotec ordered 1018: Cytotec given 1040: Dr. Elly Modena updated>Hgb 11.7, EBL 1140, syncope episode earlier, Cytotec given, and continues to bleed, MD to eval for OR  ASSESSMENT/ PLAN Retained POCs Admit for OR Mngt per Dr. Jetta Lout, Shelly Koch, CNM  10/08/2017 11:14 AM

## 2017-10-08 NOTE — Anesthesia Preprocedure Evaluation (Addendum)
Anesthesia Evaluation  Patient identified by MRN, date of birth, ID band Patient awake    Reviewed: Allergy & Precautions, NPO status , Patient's Chart, lab work & pertinent test resultsPreop documentation limited or incomplete due to emergent nature of procedure.  Airway Mallampati: II  TM Distance: >3 FB Neck ROM: Full    Dental no notable dental hx.    Pulmonary neg pulmonary ROS,    Pulmonary exam normal breath sounds clear to auscultation       Cardiovascular negative cardio ROS Normal cardiovascular exam Rhythm:Regular Rate:Normal     Neuro/Psych negative neurological ROS  negative psych ROS   GI/Hepatic negative GI ROS, Neg liver ROS,   Endo/Other  negative endocrine ROS  Renal/GU negative Renal ROS  negative genitourinary   Musculoskeletal negative musculoskeletal ROS (+)   Abdominal   Peds negative pediatric ROS (+)  Hematology negative hematology ROS (+) anemia ,   Anesthesia Other Findings Elevated DSR History of poor fetal growth Gestational trophoblastic neoplasm Environmental and seasonal allergies Supervision of high risk pregnancy, antepartum History of gestational hypertension Poor weight gain of pregnancy, unspecified trimester Iron deficiency anemia during pregnancy    Reproductive/Obstetrics negative OB ROS                            Anesthesia Physical  Anesthesia Plan  ASA: II and emergent  Anesthesia Plan: MAC   Post-op Pain Management:    Induction: Intravenous  PONV Risk Score and Plan: Treatment may vary due to age or medical condition  Airway Management Planned: Natural Airway, Mask and Nasal Cannula  Additional Equipment:   Intra-op Plan:   Post-operative Plan:   Informed Consent: I have reviewed the patients History and Physical, chart, labs and discussed the procedure including the risks, benefits and alternatives for the proposed  anesthesia with the patient or authorized representative who has indicated his/her understanding and acceptance.     Plan Discussed with: CRNA, Anesthesiologist and Surgeon  Anesthesia Plan Comments: (  )

## 2017-10-08 NOTE — MAU Note (Signed)
4103 Pt assisted up to w/c, pt states she feels like she passed a lot of blood when she stood up. Pad was applied approx 10 minutes before and pad was saturated with palm size clots. Pt states she feels like she is going to pass out, pt assisted back to the bed and provider notified.

## 2017-10-08 NOTE — MAU Note (Signed)
Pt is s/p vaginal delivery on 02/27. Presents with onset of heavy bleeding at 0620 this am. Lower abd cramping.

## 2017-10-08 NOTE — Anesthesia Postprocedure Evaluation (Signed)
Anesthesia Post Note  Patient: Shelly Koch  Procedure(s) Performed: DILATATION AND EVACUATION (N/A )     Patient location during evaluation: PACU Anesthesia Type: MAC Level of consciousness: awake and alert Pain management: pain level controlled Vital Signs Assessment: post-procedure vital signs reviewed and stable Respiratory status: spontaneous breathing, nonlabored ventilation, respiratory function stable and patient connected to nasal cannula oxygen Cardiovascular status: stable and blood pressure returned to baseline Postop Assessment: no apparent nausea or vomiting Anesthetic complications: no    Last Vitals:  Vitals:   10/08/17 1200 10/08/17 1215  BP: 111/60 125/87  Pulse: 97 85  Resp: (!) 21 15  Temp:    SpO2: 100% 100%    Last Pain:  Vitals:   10/08/17 1215  TempSrc:   PainSc: Asleep   Pain Goal:                 [name]

## 2017-10-08 NOTE — Op Note (Signed)
Shelly Koch PROCEDURE DATE: 10/08/2017  PREOPERATIVE DIAGNOSIS: 3 weeks postpartum with retained products of conception. POSTOPERATIVE DIAGNOSIS: The same. PROCEDURE:     Dilation and Evacuation. SURGEON:  Dr. Mora Bellman  INDICATIONS: 27 y.o. [id number] 3 weeks s/p SVD with heavy vaginal bleeding and ultrasound finding suspicious for retained products of conception needing surgical completion.  Risks of surgery were discussed with the patient including but not limited to: bleeding which may require transfusion; infection which may require antibiotics; injury to uterus or surrounding organs;need for additional procedures including laparotomy or laparoscopy; possibility of intrauterine scarring which may impair future fertility; and other postoperative/anesthesia complications. Written informed consent was obtained.    FINDINGS:  A 10-week size anteverted uterus, moderate amounts of products of conception, specimen sent to pathology.  ANESTHESIA:    Monitored intravenous sedation, paracervical block. INTRAVENOUS FLUIDS:  500 ml of LR ESTIMATED BLOOD LOSS:  Less than 200 ml. SPECIMENS:  Products of conception sent to pathology COMPLICATIONS:  None immediate.  PROCEDURE DETAILS:  The patient received intravenous antibiotics while in the preoperative area.  She was then taken to the operating room where general anesthesia was administered and was found to be adequate.  After an adequate timeout was performed, she was placed in the dorsal lithotomy position and examined; then prepped and draped in the sterile manner.   Her bladder was catheterized for an unmeasured amount of clear, yellow urine. A vaginal speculum was then placed in the patient's vagina and a single tooth tenaculum was applied to the anterior lip of the cervix.  A paracervical block using 0.5% Marcaine was administered. The cervix was gently dilated to accommodate a 10 mm suction curette that was gently advanced to the uterine fundus.   The suction device was then activated and curette slowly rotated to clear the uterus of products of conception.  A sharp curettage was then performed to confirm complete emptying of the uterus. There was minimal bleeding noted and the tenaculum removed with good hemostasis noted.   All instruments were removed from the patient's vagina. The patient tolerated the procedure well and was taken to the recovery area awake, and in stable condition.  The patient will be discharged to home as per PACU criteria.  Routine postoperative instructions given.  She was prescribed Percocet, Ibuprofen and Colace.  She will follow up in the clinic on 4/11 for postoperative evaluation and as scheduled in march for serial HCG

## 2017-10-08 NOTE — Transfer of Care (Signed)
Immediate Anesthesia Transfer of Care Note  Patient: Shelly Koch  Procedure(s) Performed: DILATATION AND EVACUATION (N/A )  Patient Location: PACU  Anesthesia Type:MAC  Level of Consciousness: awake, alert  and oriented  Airway & Oxygen Therapy: Patient Spontanous Breathing and Patient connected to nasal cannula oxygen  Post-op Assessment: Report given to RN, Post -op Vital signs reviewed and stable and Patient moving all extremities  Post vital signs: Reviewed and stable  Last Vitals:  Vitals:   10/08/17 1011 10/08/17 1023  BP: (!) 110/46 114/69  Pulse: 68 73  Resp:    Temp:    SpO2:      Last Pain:  Vitals:   10/08/17 1015  TempSrc:   PainSc: 8          Complications: No apparent anesthesia complications

## 2017-10-09 ENCOUNTER — Encounter (HOSPITAL_COMMUNITY): Payer: Self-pay | Admitting: Obstetrics and Gynecology

## 2017-10-09 ENCOUNTER — Telehealth: Payer: Self-pay

## 2017-10-09 LAB — TYPE AND SCREEN
ABO/RH(D): AB POS
Antibody Screen: NEGATIVE
UNIT DIVISION: 0
UNIT DIVISION: 0
Unit division: 0

## 2017-10-09 LAB — CBC
HCT: 35.5 % — ABNORMAL LOW (ref 36.0–46.0)
HEMOGLOBIN: 12.4 g/dL (ref 12.0–15.0)
MCH: 30.9 pg (ref 26.0–34.0)
MCHC: 34.9 g/dL (ref 30.0–36.0)
MCV: 88.5 fL (ref 78.0–100.0)
Platelets: 137 10*3/uL — ABNORMAL LOW (ref 150–400)
RBC: 4.01 MIL/uL (ref 3.87–5.11)
RDW: 16.6 % — ABNORMAL HIGH (ref 11.5–15.5)
WBC: 12.4 10*3/uL — ABNORMAL HIGH (ref 4.0–10.5)

## 2017-10-09 LAB — BPAM RBC
BLOOD PRODUCT EXPIRATION DATE: 201904082359
BLOOD PRODUCT EXPIRATION DATE: 201904082359
BLOOD PRODUCT EXPIRATION DATE: 201904082359
ISSUE DATE / TIME: 201903201747
ISSUE DATE / TIME: 201903202040
ISSUE DATE / TIME: 201903202256
UNIT TYPE AND RH: 6200
Unit Type and Rh: 6200
Unit Type and Rh: 6200

## 2017-10-09 NOTE — Progress Notes (Signed)
Discharge teaching complete with pt. Pt understood all information and did not have any questions. 

## 2017-10-09 NOTE — Telephone Encounter (Signed)
Patient called and was seen in MAU for bleeding yesterday and they drew an HCG on her. Patient scheduled for Friday March 29th for repeat HCG Kathrene Alu RNBSN

## 2017-10-09 NOTE — Discharge Summary (Signed)
    OB Discharge Summary     Patient Name: Shelly Koch DOB: [birth year] MRN: [id number]  Date of admission: 10/08/2017  Date of discharge: 10/09/2017  Admitting diagnosis: HEAVY BLEEDING Intrauterine pregnancy: Unknown     Secondary diagnosis:  Active Problems:   Symptomatic anemia  Additional problems: retained products of conception     Discharge diagnosis: stable                                   Hospital course:  Patient admitted for D&E secondary to heavy vaginal bleeding and ultrasound finding suspicious for retained POC 3 weeks s/p vaginal delivery. Patient underwent an uncomplicated procedure. She exhibited symptoms of symptomatic anemia pre-op and post op with several episodes of syncope. She was admitted to receive 3 units pRBC. Her Hg rose to 12.4 from 6.8. Her bleeding was minimal. She ambulated, tolerated a regular diet and voided without difficulty. Patient was found stable for discharge. Discharge instructions reviewed. Patient to keep scheduled postpartum appointments  Physical exam  Vitals:   10/09/17 0057 10/09/17 0445 10/09/17 0756 10/09/17 1228  BP: 131/81 110/71 124/79 135/83  Pulse: 83 85 77 75  Resp: 18 18 16 18   Temp: 99.1 F (37.3 C) 98.8 F (37.1 C) 98 F (36.7 C) 98.6 F (37 C)  TempSrc: Oral Oral Oral Oral  SpO2: 97% 98% 98% 99%  Weight:      Height:       General: alert, cooperative and no distress Lochia: appropriate Uterine Fundus: firm DVT Evaluation: No evidence of DVT seen on physical exam. Labs: Lab Results  Component Value Date   WBC 12.4 (H) 10/09/2017   HGB 12.4 10/09/2017   HCT 35.5 (L) 10/09/2017   MCV 88.5 10/09/2017   PLT 137 (L) 10/09/2017   CMP Latest Ref Rng & Units 09/17/2017  Glucose 65 - 99 mg/dL 91  BUN 6 - 20 mg/dL <5(L)  Creatinine 0.44 - 1.00 mg/dL 0.43(L)  Sodium 135 - 145 mmol/L 133(L)  Potassium 3.5 - 5.1 mmol/L 4.2  Chloride 101 - 111 mmol/L 104  CO2 22 - 32 mmol/L 19(L)  Calcium 8.9 - 10.3 mg/dL 8.8(L)   Total Protein 6.5 - 8.1 g/dL 6.3(L)  Total Bilirubin 0.3 - 1.2 mg/dL 0.7  Alkaline Phos 38 - 126 U/L 207(H)  AST 15 - 41 U/L 41  ALT 14 - 54 U/L 16    Discharge instruction: per After Visit Summary and "Baby and Me Booklet".  After visit meds:  Allergies as of 10/09/2017      Reactions   Okra    Hives and itching   Ondansetron Hives, Itching   Phenergan [promethazine] Hives, Itching   Note: pt tolerates PO COMPAZINE   Zofran [ondansetron Hcl] Rash      Diet: routine diet  Activity: Advance as tolerated. Pelvic rest for 6 weeks.   Follow up Appt: Future Appointments  Date Time Provider [place]  10/17/2017  2:00 PM [place] [facility]    10/09/2017 Mora Bellman, MD

## 2017-10-09 NOTE — Progress Notes (Signed)
1 Day Post-Op Procedure(s) (LRB): DILATATION AND EVACUATION (N/A)for retained POC 22 days postpartum  Subjective: Patient reports 1. LLQ pain not relieved by ibuprofen.                           2. Lip and tongue "numb"                          3. Sore under tongue on frenulum.                            4. Multiple complaints of backache,  Objective: I have reviewed patient's vital signs and labs. This morning's CBC not drawn yet, ordered 5 am.  BP 110/71 (BP Location: Right Arm)   Pulse 85   Temp 98.8 F (37.1 C) (Oral)   Resp 18   Ht 4\' 10"  (1.473 m)   Wt 105 lb (47.6 kg)   SpO2 98%   BMI 21.95 kg/m   General: alert, cooperative and no distress Cardio: regular rate and rhythm GI: soft, non-tender; bowel sounds normal; no masses,  no organomegaly and normal findings: unable to palpate uterus Vaginal Bleeding: minimal HEENT" Oral exam tongue motion normal, grimace and smile symmetric, speech normal.               Tiny 2 mm ulceration on base of frenulum.    Assessment: s/p D and E, with transfusion 3 units prbc. s/p Procedure(s): DILATATION AND EVACUATION (N/A): stable, anemia and post transfusion cbc pending  Plan: Discharge home this morning by Dr Elly Modena once CBC results available. tramadol x 1 for llq pain Encourage ambulation.  LOS: 0 days    Jonnie Kind 10/09/2017, 6:53 AM

## 2017-10-09 NOTE — Telephone Encounter (Signed)
-----   Message from Starr Lake, CNM sent at 10/06/2017 10:17 PM EDT ----- This patient is going to needs a beta hcg drawn sometime the week of 3/18 and the week of 3/25. Depending on her levels, she may need to continue monitoring (see note under gestational trophoblastic disease). Do you mind calling her to set up her nurse visits for this week and next week? Thank you!

## 2017-10-09 NOTE — Discharge Instructions (Signed)
Dilation and Curettage or Vacuum Curettage Dilation and curettage (D&C) and vacuum curettage are minor procedures. A D&C involves stretching (dilation) the cervix and scraping (curettage) the inside lining of the uterus (endometrium). During a D&C, tissue is gently scraped from the endometrium, starting from the top portion of the uterus down to the lowest part of the uterus (cervix). During a vacuum curettage, the lining and tissue in the uterus are removed with the use of gentle suction. Curettage may be performed to either diagnose or treat a problem. As a diagnostic procedure, curettage is performed to examine tissues from the uterus. A diagnostic curettage may be done if you have:  Irregular bleeding in the uterus.  Bleeding with the development of clots.  Spotting between menstrual periods.  Prolonged menstrual periods or other abnormal bleeding.  Bleeding after menopause.  No menstrual period (amenorrhea).  A change in size and shape of the uterus.  Abnormal endometrial cells discovered during a Pap test.  As a treatment procedure, curettage may be performed for the following reasons:  Removal of an IUD (intrauterine device).  Removal of retained placenta after giving birth.  Abortion.  Miscarriage.  Removal of endometrial polyps.  Removal of uncommon types of noncancerous lumps (fibroids).  Tell a health care provider about:  Any allergies you have, including allergies to prescribed medicine or latex.  All medicines you are taking, including vitamins, herbs, eye drops, creams, and over-the-counter medicines. This is especially important if you take any blood-thinning medicine. Bring a list of all of your medicines to your appointment.  Any problems you or family members have had with anesthetic medicines.  Any blood disorders you have.  Any surgeries you have had.  Your medical history and any medical conditions you have.  Whether you are pregnant or may be  pregnant.  Recent vaginal infections you have had.  Recent menstrual periods, bleeding problems you have had, and what form of birth control (contraception) you use. What are the risks? Generally, this is a safe procedure. However, problems may occur, including:  Infection.  Heavy vaginal bleeding.  Allergic reactions to medicines.  Damage to the cervix or other structures or organs.  Development of scar tissue (adhesions) inside the uterus, which can cause abnormal amounts of menstrual bleeding. This may make it harder to get pregnant in the future.  A hole (perforation) or puncture in the uterine wall. This is rare.  What happens before the procedure? Staying hydrated Follow instructions from your health care provider about hydration, which may include:  Up to 2 hours before the procedure - you may continue to drink clear liquids, such as water, clear fruit juice, black coffee, and plain tea.  Eating and drinking restrictions Follow instructions from your health care provider about eating and drinking, which may include:  8 hours before the procedure - stop eating heavy meals or foods such as meat, fried foods, or fatty foods.  6 hours before the procedure - stop eating light meals or foods, such as toast or cereal.  6 hours before the procedure - stop drinking milk or drinks that contain milk.  2 hours before the procedure - stop drinking clear liquids. If your health care provider told you to take your medicine(s) on the day of your procedure, take them with only a sip of water.  Medicines  Ask your health care provider about: ? Changing or stopping your regular medicines. This is especially important if you are taking diabetes medicines or blood thinners. ? Taking   medicines such as aspirin and ibuprofen. These medicines can thin your blood. Do not take these medicines before your procedure if your health care provider instructs you not to.  You may be given antibiotic  medicine to help prevent infection. General instructions  For 24 hours before your procedure, do not: ? Douche. ? Use tampons. ? Use medicines, creams, or suppositories in the vagina. ? Have sexual intercourse.  You may be given a pregnancy test on the day of the procedure.  Plan to have someone take you home from the hospital or clinic.  You may have a blood or urine sample taken.  If you will be going home right after the procedure, plan to have someone with you for 24 hours. What happens during the procedure?  To reduce your risk of infection: ? Your health care team will wash or sanitize their hands. ? Your skin will be washed with soap.  An IV tube will be inserted into one of your veins.  You will be given one of the following: ? A medicine that numbs the area in and around the cervix (local anesthetic). ? A medicine to make you fall asleep (general anesthetic).  You will lie down on your back, with your feet in foot rests (stirrups).  The size and position of your uterus will be checked.  A lubricated instrument (speculum or Sims retractor) will be inserted into the back side of your vagina. The speculum will be used to hold apart the walls of your vagina so your health care provider can see your cervix.  A tool (tenaculum) will be attached to the lip of the cervix to stabilize it.  Your cervix will be softened and dilated. This may be done by: ? Taking a medicine. ? Having tapered dilators or thin rods (laminaria) or gradual widening instruments (tapered dilators) inserted into your cervix.  A small, sharp, curved instrument (curette) will be used to scrape a small amount of tissue or cells from the endometrium or cervical canal. In some cases, gentle suction is applied with the curette. The curette will then be removed. The cells will be taken to a lab for testing. The procedure may vary among health care providers and hospitals. What happens after the  procedure?  You may have mild cramping, backache, pain, and light bleeding or spotting. You may pass small blood clots from your vagina.  You may have to wear compression stockings. These stockings help to prevent blood clots and reduce swelling in your legs.  Your blood pressure, heart rate, breathing rate, and blood oxygen level will be monitored until the medicines you were given have worn off. Summary  Dilation and curettage (D&C) involves stretching (dilation) the cervix and scraping (curettage) the inside lining of the uterus (endometrium).  After the procedure, you may have mild cramping, backache, pain, and light bleeding or spotting. You may pass small blood clots from your vagina.  Plan to have someone take you home from the hospital or clinic. This information is not intended to replace advice given to you by your health care provider. Make sure you discuss any questions you have with your health care provider. Document Released: 07/08/2005 Document Revised: 03/24/2016 Document Reviewed: 03/24/2016 Elsevier Interactive Patient Education  2018 Elsevier Inc.  

## 2017-10-10 ENCOUNTER — Other Ambulatory Visit: Payer: Medicaid Other

## 2017-10-15 ENCOUNTER — Other Ambulatory Visit: Payer: Medicaid Other

## 2017-10-17 ENCOUNTER — Other Ambulatory Visit: Payer: Medicaid Other

## 2017-10-17 DIAGNOSIS — Z8759 Personal history of other complications of pregnancy, childbirth and the puerperium: Secondary | ICD-10-CM

## 2017-10-18 LAB — BETA HCG QUANT (REF LAB): hCG Quant: <1 m[IU]/mL

## 2017-10-24 ENCOUNTER — Ambulatory Visit (INDEPENDENT_AMBULATORY_CARE_PROVIDER_SITE_OTHER): Payer: Medicaid Other | Admitting: Obstetrics and Gynecology

## 2017-10-24 ENCOUNTER — Encounter: Payer: Self-pay | Admitting: Obstetrics and Gynecology

## 2017-10-24 ENCOUNTER — Ambulatory Visit: Payer: Medicaid Other | Admitting: Obstetrics and Gynecology

## 2017-10-24 VITALS — BP 119/71 | HR 101 | Ht <= 58 in | Wt 106.5 lb

## 2017-10-24 DIAGNOSIS — Z9889 Other specified postprocedural states: Secondary | ICD-10-CM

## 2017-10-24 MED ORDER — POLYETHYLENE GLYCOL 3350 17 G PO PACK: 17.0000 g | PACK | Freq: Every day | ORAL | 0 refills | Status: DC

## 2017-10-24 NOTE — Progress Notes (Signed)
27 yo here for post op check s/p D&C for retained products of conception. Patient reports feeling well without complaints. She denies any further episodes of vaginal bleeding. She has remained abstinent and is not breastfeeding infant. She reports feeling well and is without concerns  Past Medical History:  Diagnosis Date  . Anemia   . Gestational trophoblastic neoplasm   . PICC (peripherally inserted central catheter) in place 12/24/2014  . Preterm labor   . Pyelonephritis    Past Surgical History:  Procedure Laterality Date  . DILATION AND EVACUATION N/A 08/10/2014   Procedure: DILATATION AND EVACUATION;  Surgeon: Lavonia Drafts, MD;  Location: [facility];  Service: Gynecology;  Laterality: N/A;  . DILATION AND EVACUATION N/A 10/08/2017   Procedure: DILATATION AND EVACUATION;  Surgeon: Mora Bellman, MD;  Location: [facility];  Service: Gynecology;  Laterality: N/A;  . HYSTEROSCOPY N/A 08/10/2014   Procedure: HYSTEROSCOPY;  Surgeon: Lavonia Drafts, MD;  Location: [facility];  Service: Gynecology;  Laterality: N/A;   Family History  Problem Relation Age of Onset  . Alcohol abuse Neg Hx    Social History   Tobacco Use  . Smoking status: Never Smoker  . Smokeless tobacco: Never Used  Substance Use Topics  . Alcohol use: No  . Drug use: No   ROS See pertinent in HPI  Blood pressure 119/71, pulse (!) 101, height 4\' 10"  (1.473 m), weight 106 lb 8 oz (48.3 kg), unknown if currently breastfeeding. GENERAL: Well-developed, well-nourished female in no acute distress.  ABDOMEN: Soft, nontender, nondistended. No organomegaly. NEURO: alert and oriented x 3 EXTREMITIES: No cyanosis, clubbing, or edema, 2+ distal pulses.  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  - Reviewed pathology result with patient - Rx for stoll softener provided per patient request

## 2017-10-30 ENCOUNTER — Ambulatory Visit: Payer: Medicaid Other | Admitting: Student

## 2017-10-30 ENCOUNTER — Encounter: Payer: Self-pay | Admitting: Obstetrics and Gynecology

## 2017-10-30 ENCOUNTER — Ambulatory Visit: Payer: Medicaid Other | Admitting: Obstetrics and Gynecology

## 2017-10-30 ENCOUNTER — Ambulatory Visit (INDEPENDENT_AMBULATORY_CARE_PROVIDER_SITE_OTHER): Payer: Medicaid Other | Admitting: Obstetrics and Gynecology

## 2017-10-30 VITALS — BP 120/65 | HR 83 | Wt 106.4 lb

## 2017-10-30 DIAGNOSIS — Z1389 Encounter for screening for other disorder: Secondary | ICD-10-CM | POA: Diagnosis not present

## 2017-10-30 DIAGNOSIS — O019 Hydatidiform mole, unspecified: Secondary | ICD-10-CM

## 2017-10-30 NOTE — Progress Notes (Signed)
Obstetrics Visit Postpartum Visit  Appointment Date: 10/30/2017  OBGYN Clinic: [facility]  Primary Care Provider: Denita Lung  Chief Complaint:  Chief Complaint  Patient presents with  . Postpartum Care    History of Present Illness: Shelly Koch is a 27 y.o. Asian [id number] (No LMP recorded.), seen for the above chief complaint.   She is s/p TSVD/2nd on 2/27 with manual extraction of placenta due to cord avulstion; she was discharged to home on PPD#2. She had to go to the OR on 3/20 for VB and concern for rPOCs. Final path did show POCs and she also received 3U PRBC during that hospitalization.    Vaginal bleeding or discharge: No  Breast or formula feeding: formula Intercourse: No  Contraception after delivery: No  PP depression s/s: No  Pap smear: no abnormalities (date: 2017)  Review of Systems:  as noted in the History of Present Illness.   Medications Shelly Koch had no medications administered during this visit. Current Outpatient Medications  Medication Sig Dispense Refill  . docusate sodium (COLACE) 100 MG capsule Take 1 capsule (100 mg total) by mouth 2 (two) times daily as needed. 30 capsule 2  . ferrous sulfate (FERROUSUL) 325 (65 FE) MG tablet Take 1 tablet (325 mg total) by mouth 2 (two) times daily. 60 tablet 1  . polyethylene glycol (MIRALAX / GLYCOLAX) packet Take 17 g by mouth daily. 14 each 0  . Prenatal Vit-Fe Fumarate-FA (PRENATAL MULTIVITAMIN) TABS tablet Take 1 tablet by mouth daily at 12 noon. 30 tablet 11   No current facility-administered medications for this visit.     Allergies Okra; Ondansetron; Phenergan [promethazine]; and Zofran [ondansetron hcl]  Physical Exam:  BP 120/65 (BP Location: Right Arm, Patient Position: Sitting, Cuff Size: Normal)   Pulse 83   Wt 106 lb 6.4 oz (48.3 kg)   Breastfeeding? No   BMI 22.24 kg/m  Body mass index is 22.24 kg/m. General appearance: Well nourished, well developed female  in no acute distress.  Cardiovascular: normal s1 and s2.  No murmurs, rubs or gallops. Respiratory:  Clear to auscultation bilateral. Normal respiratory effort Abdomen: positive bowel sounds and no masses, hernias; diffusely non tender to palpation, non distended Neuro/Psych:  Normal mood and affect.  Skin:  Warm and dry.  Lymphatic:  No inguinal lymphadenopathy.   Pelvic exam: is not limited by body habitus EGBUS: within normal limits Vagina: within normal limits and with no blood in the vault, Cervix:  no lesions or cervical motion tenderness Uterus:  nonenlarged and approximately 8 week sized Adnexa:  normal adnexa and no mass, fullness, tenderness Rectovaginal: deferred  Laboratory: 3/29 beta hcg neg.   PP Depression Screening:  EPDS 2  Assessment: pt doing well  Plan:  D/w her negative path for GTN with placenta and neg PP quant so no need for further f/u. Patient interested in [facility] options and all options d/w her and she is leaning towards OCP but will think about options. D/w her that can get pregnant even if no period in the postpartum period yet, although it sounds like one may have just ended.   RTC PRN  Durene Romans MD Attending [facility] [employer] [facility])

## 2017-11-28 ENCOUNTER — Encounter: Payer: Self-pay | Admitting: Family Medicine

## 2017-11-28 ENCOUNTER — Ambulatory Visit: Payer: Medicaid Other | Admitting: Family Medicine

## 2017-11-28 VITALS — BP 104/64 | HR 76 | Temp 98.4°F | Resp 16 | Ht <= 58 in | Wt 107.4 lb

## 2017-11-28 DIAGNOSIS — H547 Unspecified visual loss: Secondary | ICD-10-CM | POA: Diagnosis not present

## 2017-11-28 DIAGNOSIS — G44209 Tension-type headache, unspecified, not intractable: Secondary | ICD-10-CM

## 2017-11-28 DIAGNOSIS — Z30011 Encounter for initial prescription of contraceptive pills: Secondary | ICD-10-CM | POA: Diagnosis not present

## 2017-11-28 DIAGNOSIS — Z7251 High risk heterosexual behavior: Secondary | ICD-10-CM | POA: Diagnosis not present

## 2017-11-28 LAB — POCT URINE PREGNANCY: Preg Test, Ur: NEGATIVE

## 2017-11-28 MED ORDER — NORETHINDRONE ACET-ETHINYL EST 1-20 MG-MCG PO TABS: 1.0000 | ORAL_TABLET | Freq: Every day | ORAL | 11 refills | Status: DC

## 2017-11-28 NOTE — Progress Notes (Signed)
   Subjective:    Patient ID: Shelly Koch, female    DOB: 1991-06-03, 27 y.o.   MRN: [id number]  HPI Chief Complaint  Patient presents with  . headache    headache on back of neck- 2 weeks. taking ibuprofen but no relief   She is here with complaints of a mild dull constant posterior headache for the past 2 weeks. States she has had a similar headache in the past that was caused by poor vision and when she started wearing her glasses at that time her headache improved. She has not tried wearing the glasses this time but has them at home. States she has taken Tylenol without relief. States she thinks her headache is related to her eyes. States her glasses are no longer the correct prescription and is requesting a referral to an eye doctor. States her insurance requires her to have a referral from PCP.  Denies fever, chills, dizziness, URI symptoms, tinnitus, eye pain, neck pain, chest pain, palpitations, shortness of breath, cough, abdominal pain, N/V/D.   She reports her main issue for the visit today is to get started on birth control pills. States she had a baby 10 weeks ago and did not decide on birth control until now. States has used Depo Provera as well as an implant in the past. Has never used birth control pills.   States she has not started back on her periods yet. She is not breast feeding.   No other concerns.   Reviewed allergies, medications, past medical, surgical, family, and social history.     Review of Systems Pertinent positives and negatives in the history of present illness.     Objective:   Physical Exam BP 104/64   Pulse 76   Temp 98.4 F (36.9 C) (Oral)   Resp 16   Ht 4' 7.5" (1.41 m)   Wt 107 lb 6.4 oz (48.7 kg)   SpO2 98%   Breastfeeding? No   BMI 24.51 kg/m  Alert and in no distress. Tympanic membranes and canals are normal. Pharyngeal area is normal. Neck is supple without adenopathy or thyromegaly. Cardiac exam shows a regular rhythm without  murmurs or gallops. Lungs are clear to auscultation. PERRLA, EOMs intact, CN II-IX intact, skin is warm and dry, no pallor.       Assessment & Plan:  Encounter for BCP (birth control pills) initial prescription - Plan: norethindrone-ethinyl estradiol (LOESTRIN 1/20, 21,) 1-20 MG-MCG tablet, POCT urine pregnancy  Acute non intractable tension-type headache  Unprotected sex - Plan: POCT urine pregnancy  Decreased visual acuity - Plan: Ambulatory referral to Ophthalmology  UPT negative.  Counseling on OCPs and potential side effects. Prescription sent to pharmacy.  Visual acuity is decreased and a referral made for further vision screening and new glasses. She is adamant that her headache is related to needing glasses and that she has had this issue in the past. Normal exam.  Follow up with her PCP in the next 2-4 weeks for headache and questions or concerns related to OCPs.

## 2017-11-28 NOTE — Patient Instructions (Signed)
Oral Contraception Information Oral contraceptive pills (OCPs) are medicines taken to prevent pregnancy. OCPs work by preventing the ovaries from releasing eggs. The hormones in OCPs also cause the cervical mucus to thicken, preventing the sperm from entering the uterus. The hormones also cause the uterine lining to become thin, not allowing a fertilized egg to attach to the inside of the uterus. OCPs are highly effective when taken exactly as prescribed. However, OCPs do not prevent sexually transmitted diseases (STDs). Safe sex practices, such as using condoms along with the pill, can help prevent STDs. Before taking the pill, you may have a physical exam and Pap test. Your health care provider may order blood tests. The health care provider will make sure you are a good candidate for oral contraception. Discuss with your health care provider the possible side effects of the OCP you may be prescribed. When starting an OCP, it can take 2 to 3 months for the body to adjust to the changes in hormone levels in your body. Types of oral contraception  The combination pill-This pill contains estrogen and progestin (synthetic progesterone) hormones. The combination pill comes in 21-day, 28-day, or 91-day packs. Some types of combination pills are meant to be taken continuously (365-day pills). With 21-day packs, you do not take pills for 7 days after the last pill. With 28-day packs, the pill is taken every day. The last 7 pills are without hormones. Certain types of pills have more than 21 hormone-containing pills. With 91-day packs, the first 84 pills contain both hormones, and the last 7 pills contain no hormones or contain estrogen only.  The minipill-This pill contains the progesterone hormone only. The pill is taken every day continuously. It is very important to take the pill at the same time each day. The minipill comes in packs of 28 pills. All 28 pills contain the hormone. Advantages of oral  contraceptive pills  Decreases premenstrual symptoms.  Treats menstrual period cramps.  Regulates the menstrual cycle.  Decreases a heavy menstrual flow.  May treatacne, depending on the type of pill.  Treats abnormal uterine bleeding.  Treats polycystic ovarian syndrome.  Treats endometriosis.  Can be used as emergency contraception. Things that can make oral contraceptive pills less effective OCPs can be less effective if:  You forget to take the pill at the same time every day.  You have a stomach or intestinal disease that lessens the absorption of the pill.  You take OCPs with other medicines that make OCPs less effective, such as antibiotics, certain HIV medicines, and some seizure medicines.  You take expired OCPs.  You forget to restart the pill on day 7, when using the packs of 21 pills.  Risks associated with oral contraceptive pills Oral contraceptive pills can sometimes cause side effects, such as:  Headache.  Nausea.  Breast tenderness.  Irregular bleeding or spotting.  Combination pills are also associated with a small increased risk of:  Blood clots.  Heart attack.  Stroke.  This information is not intended to replace advice given to you by your health care provider. Make sure you discuss any questions you have with your health care provider. Document Released: 09/28/2002 Document Revised: 12/14/2015 Document Reviewed: 12/27/2012 Elsevier Interactive Patient Education  2018 [employer]vier Inc.  

## 2017-12-04 ENCOUNTER — Other Ambulatory Visit: Payer: Self-pay | Admitting: Obstetrics and Gynecology

## 2018-03-09 DIAGNOSIS — H52203 Unspecified astigmatism, bilateral: Secondary | ICD-10-CM | POA: Diagnosis not present

## 2018-03-09 DIAGNOSIS — H5213 Myopia, bilateral: Secondary | ICD-10-CM | POA: Diagnosis not present

## 2018-03-16 IMAGING — US US MFM OB FOLLOW-UP
1 series · 14 of 28 positions shown · non-contrast
Comparison: none

[Series 1: us mfm ob follow-up · 40 acquisitions, 14 frames shown]
[im 2/40]
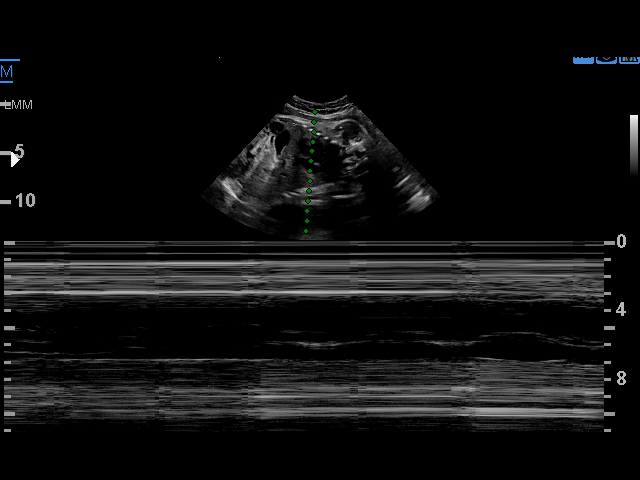
[im 5/40]
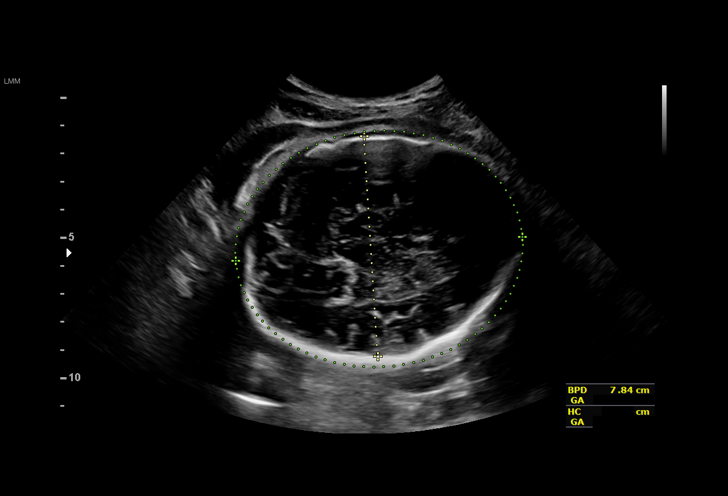
[im 8/40]
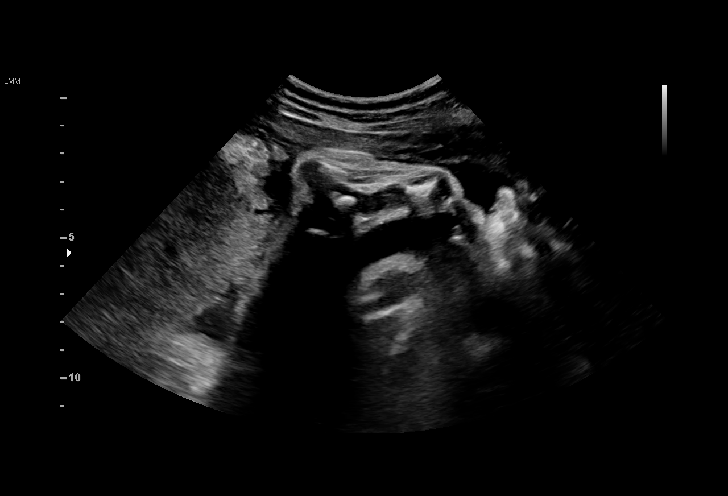
[im 11/40]
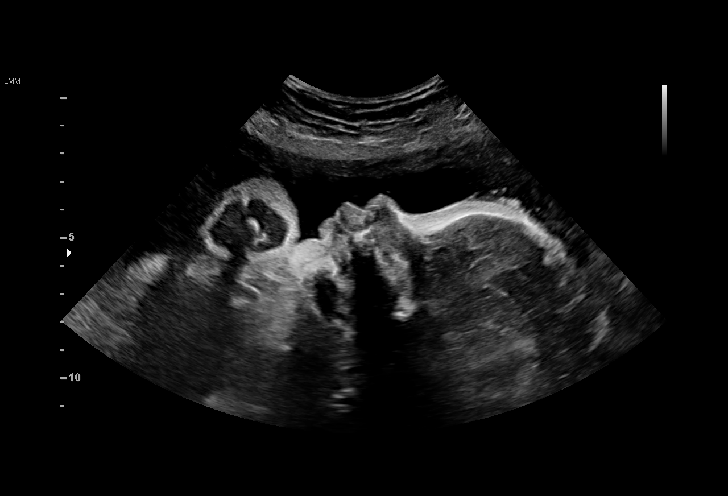
[im 14/40]
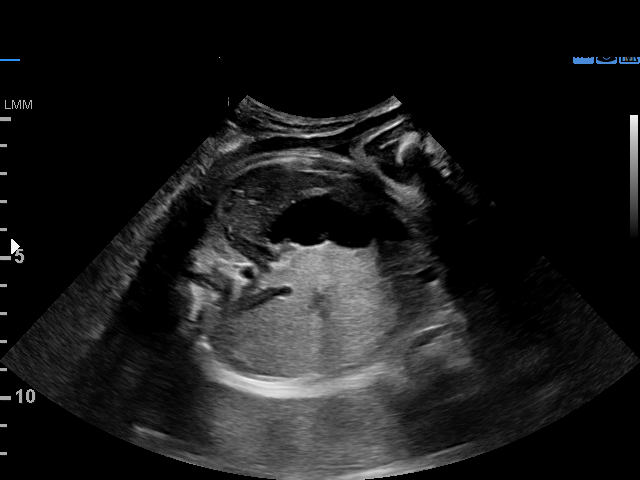
[im 16/40]
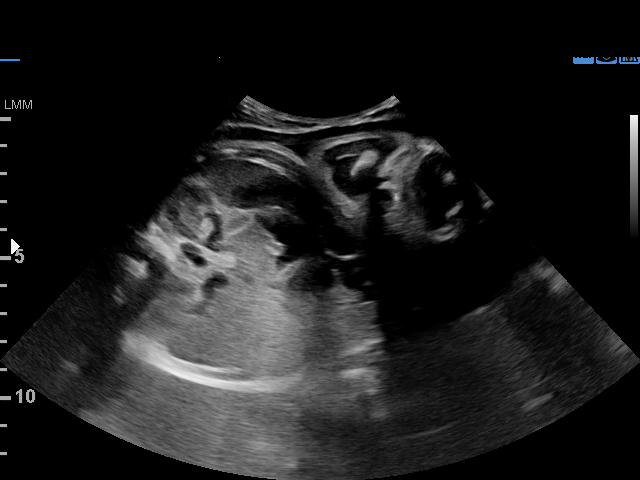
[im 19/40]
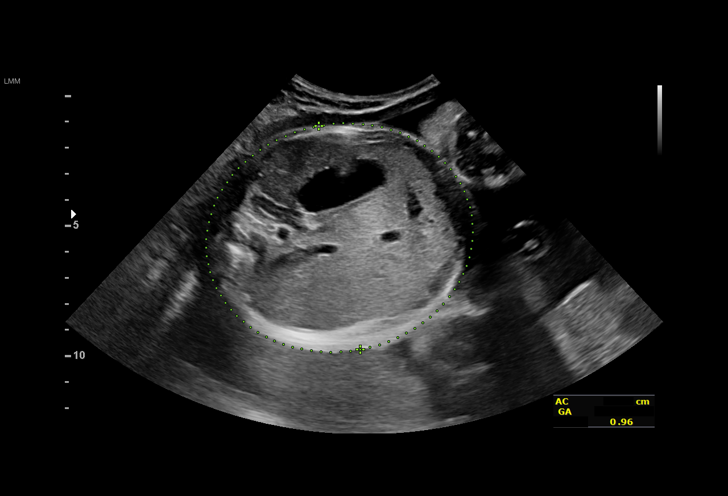
[im 22/40]
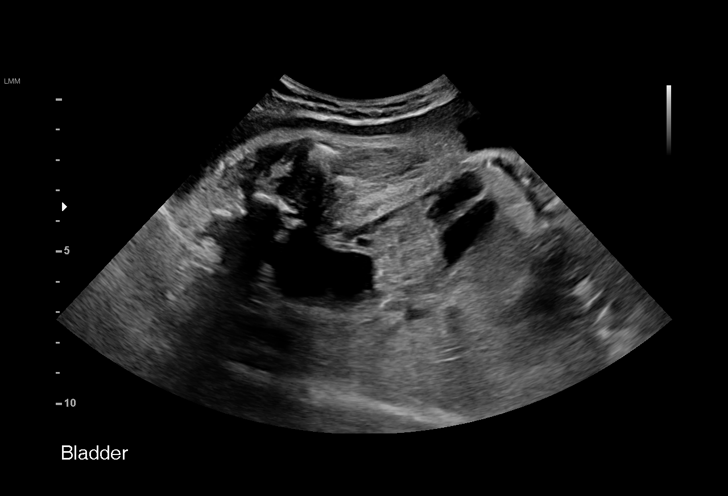
[im 25/40]
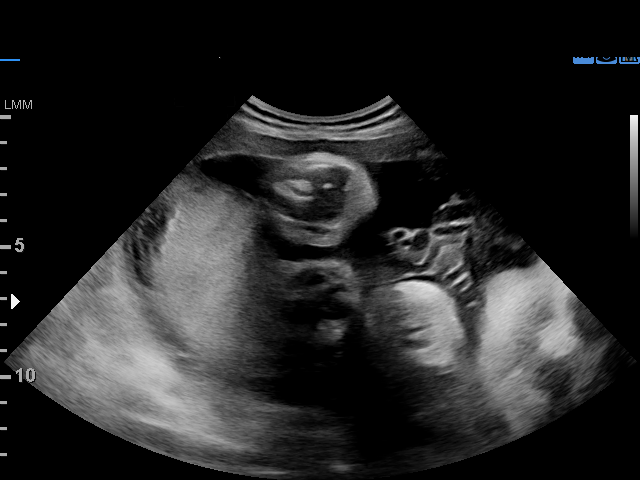
[im 28/40]
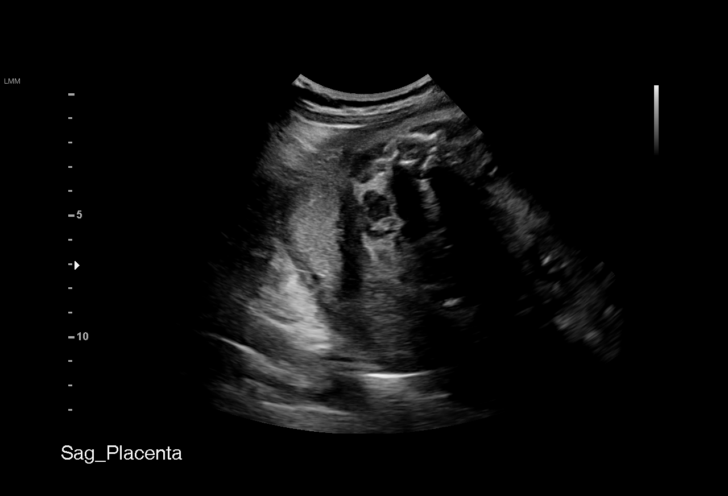
[im 31/40]
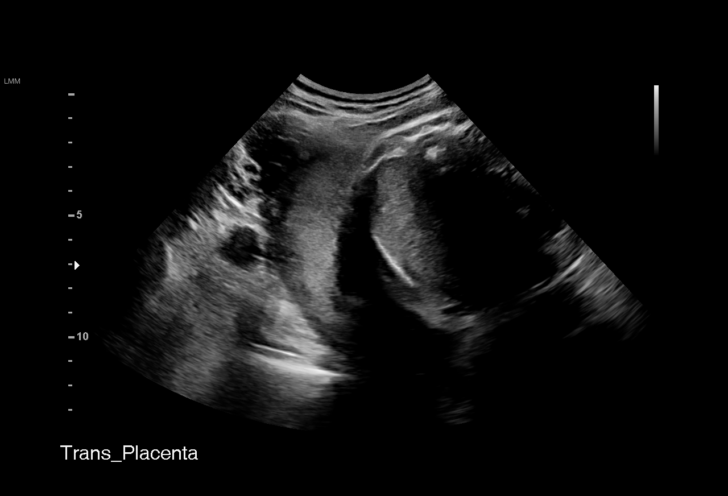
[im 34/40]
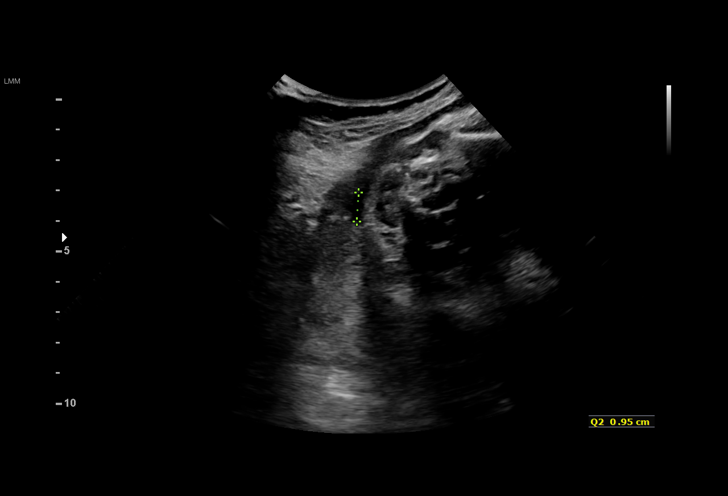
[im 37/40]
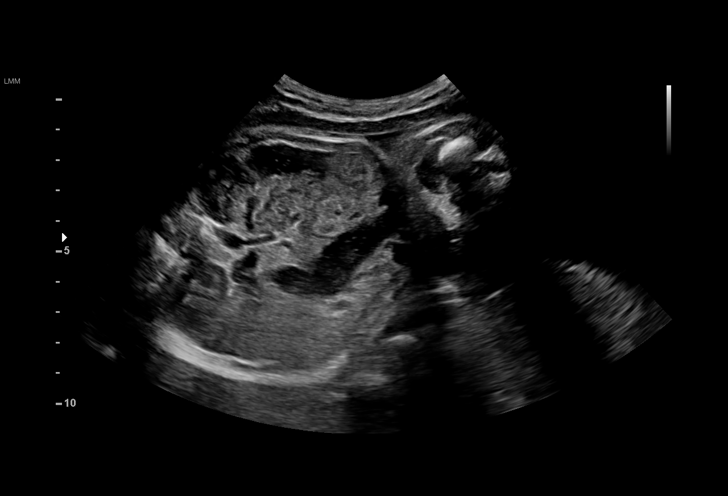
[im 40/40]
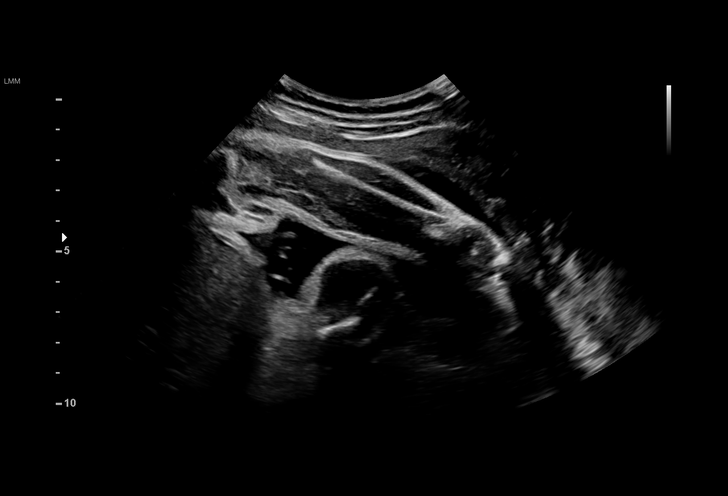

[14 of 28 positions shown; findings below may reference images not displayed]

OB/Gyn Clinic
[REDACTED]

Indications

34 weeks gestation of pregnancy
Abnormal biochemical finding on antenatal
screening of mother; increased risk for T21
(1 in 171); elevated inhibin; low risk NIPS
Poor obstetric history: Previous
preeclampsia / eclampsia/gestational HTN-
ASA
OB History

Blood Type:            Height:  4'8"   Weight (lb):  87        BMI:
Gravidity:    2         Term:   1        Prem:   0        SAB:   0
TOP:          0       Ectopic:  0        Living: 1
Fetal Evaluation

Num Of Fetuses:     1
Fetal Heart         129
Rate(bpm):
Cardiac Activity:   Observed
Presentation:       Cephalic
Placenta:           Posterior, above cervical os
P. Cord Insertion:  Previously Visualized
Amniotic Fluid
AFI FV:      Subjectively within normal limits

AFI Sum(cm)     %Tile       Largest Pocket(cm)
12.02           34

RUQ(cm)       RLQ(cm)       LUQ(cm)        LLQ(cm)
2.73
Biometry

BPD:      78.4  mm     G. Age:  31w 3d          1  %    CI:        74.73   %    70 - 86
FL/HC:      21.0   %    19.4 -
HC:      287.8  mm     G. Age:  31w 5d        < 3  %    HC/AC:      0.96        0.96 -
AC:      299.2  mm     G. Age:  33w 6d         47  %    FL/BPD:     76.9   %    71 - 87
FL:       60.3  mm     G. Age:  31w 2d        < 3  %    FL/AC:      20.2   %    20 - 24

Est. FW:    9991  gm      4 lb 8 oz     33  %
Gestational Age

LMP:           36w 6d        Date:  11/30/16                 EDD:   09/06/17
U/S Today:     32w 1d                                        EDD:   10/09/17
Best:          34w 1d     Det. By:  Early Ultrasound         EDD:   09/25/17
(01/28/17)
Anatomy

Cranium:               Appears normal         Aortic Arch:            Previously seen
Cavum:                 Appears normal         Ductal Arch:            Previously seen
Ventricles:            Appears normal         Diaphragm:              Previously seen
Choroid Plexus:        Previously seen        Stomach:                Appears normal, left
sided
Cerebellum:            Previously seen        Abdomen:                Appears normal
Posterior Fossa:       Previously seen        Abdominal Wall:         Previously seen
Nuchal Fold:           Previously seen        Cord Vessels:           Previously seen
Face:                  Orbits and profile     Kidneys:                Appear normal
previously seen
Lips:                  Previously seen        Bladder:                Appears normal
Thoracic:              Appears normal         Spine:                  Previously seen
Heart:                 Previously seen        Upper Extremities:      Previously seen
RVOT:                  Previously seen        Lower Extremities:      Previously seen
LVOT:                  Previously seen

Other:  PARENTS DO NOT WANT TO KNOW GENDER!!!! Heels and 5th
digit previously seen.
Impression

SIUP at 34+1 weeks
Normal interval anatomy; anatomic survey complete
Normal amniotic fluid volume
Appropriate interval growth with EFW at the 33rd %tile
Recommendations

Follow-up as clinically indicated

## 2018-03-17 ENCOUNTER — Ambulatory Visit: Payer: Medicaid Other | Admitting: Medical

## 2018-03-17 VITALS — BP 100/70 | HR 86 | Temp 97.9°F | Resp 16 | Ht <= 58 in | Wt 102.8 lb

## 2018-03-17 DIAGNOSIS — Z309 Encounter for contraceptive management, unspecified: Secondary | ICD-10-CM

## 2018-03-17 DIAGNOSIS — M545 Low back pain, unspecified: Secondary | ICD-10-CM

## 2018-03-17 DIAGNOSIS — R109 Unspecified abdominal pain: Secondary | ICD-10-CM

## 2018-03-17 LAB — POCT URINALYSIS DIP (PROADVANTAGE DEVICE)
BILIRUBIN UA: NEGATIVE mg/dL
Bilirubin, UA: NEGATIVE
Blood, UA: NEGATIVE
Glucose, UA: NEGATIVE mg/dL
LEUKOCYTES UA: NEGATIVE
Nitrite, UA: NEGATIVE
PH UA: 6 (ref 5.0–8.0)
PROTEIN UA: NEGATIVE mg/dL
SPECIFIC GRAVITY, URINE: 1.025
UUROB: NEGATIVE

## 2018-03-17 LAB — POCT URINE PREGNANCY: PREG TEST UR: NEGATIVE

## 2018-03-17 MED ORDER — MEDROXYPROGESTERONE ACETATE 150 MG/ML IM SUSP
150.0000 mg | Freq: Once | INTRAMUSCULAR | Status: AC
  Administered 2018-03-17: 150 mg via INTRAMUSCULAR

## 2018-03-17 NOTE — Patient Instructions (Addendum)
Recommendations  Stop your oral birth control since we changed to Depo Provera injection today  Return in 3 months for next Depo Provera injection  I would like to check some labs on you in a few months since your last labs in 09/2017 showed some abnormalities  Lets plan a physical visit in 3-4 months for general wellness exam but also to recheck on how you are doing with abdominal and back pain  If your abdominal and back pain isn't improving or is worse in the next month, then recheck sooner  Your last pap smear/cervical cancer screen was 2017.  This cancer screen needs to be repeated periodically

## 2018-03-17 NOTE — Progress Notes (Signed)
Subjective:  Shelly Koch is a 27 y.o. female who presents for Chief Complaint  Patient presents with  . birth control    wants to change bc to shot      Here today for desire to go back on Depo-Provera.  She is currently compliant with her oral combined estrogen progesterone pill.  However she feels like its leading to some lower belly and lower back discomfort.  She says she has had this problem before oral birth control and she is not getting regular periods like she should.  She says she tolerated Depo-Provera really well in the past would like to go back on this.  She is a non-smoker.  She denies any recent injury, fall, fever, weight changes, vomiting, no bowel or bladder issues.  No urinary frequency urgency or odor in urine.  No blood in the urine or stool.  No other recent changes in diet or exercise.  She does exercise walking at work.  Eats pretty healthy.  Otherwise feels fine.  She does not perceivably want to have anymore kids but is leaving that possibility open.  She has 2 children already  Last menstrual period was over a year ago before her last childbirth  No other aggravating or relieving factors.    No other c/o.  The following portions of the patient's history were reviewed and updated as appropriate: allergies, current medications, past family history, past medical history, past social history, past surgical history and problem list.  ROS Otherwise as in subjective above   Objective: BP 100/70   Pulse 86   Temp 97.9 F (36.6 C) (Oral)   Resp 16   Ht 4\' 10"  (1.473 m)   Wt 102 lb 12.8 oz (46.6 kg)   SpO2 97%   BMI 21.49 kg/m   General appearance: alert, no distress, well developed, well nourished Neck: supple, no lymphadenopathy, no thyromegaly, no masses Heart: RRR, normal S1, S2, no murmurs Lungs: CTA bilaterally, no wheezes, rhonchi, or rales Abdomen: +bs, soft, non tender, non distended, no masses, no hepatomegaly, no splenomegaly Back: With mild  lower lumbar paraspinal tenderness but normal range of motion Hip range of motion normal without tenderness Pulses: 2+ radial pulses, 2+ pedal pulses, normal cap refill Ext: no edema   Assessment: Encounter Diagnoses  Name Primary?  . Abdominal pain, unspecified abdominal location Yes  . Low back pain without sciatica, unspecified back pain laterality, unspecified chronicity   . Encounter for contraceptive management, unspecified type      Plan: We discussed her concerns and symptoms.  We discussed differential for abdominal and back pain.  Urinalysis reviewed and it was normal.  Urine pregnancy negative.  Discussed risk and benefits of Depo-Provera, discussed options for contraception.  Advised that with Depo-Provera she likely would not have a menstrual period regularly on this either  She says she will never use an IUD, has been on implantable birth control in the past but does not want that at this time, declines NuvaRing and really does not want to use a pill at this time.  Depo-Provera IM given today, plan to follow-up in 3 months for next injection and also for a physical at that time  Discussed Pap smear screening, frequency of this  Shelly Koch was seen today for birth control.  Diagnoses and all orders for this visit:  Abdominal pain, unspecified abdominal location -     POCT Urinalysis DIP (Proadvantage Device)  Low back pain without sciatica, unspecified back pain laterality, unspecified chronicity -  POCT Urinalysis DIP (Proadvantage Device)  Encounter for contraceptive management, unspecified type -     POCT urine pregnancy -     medroxyPROGESTERone (DEPO-PROVERA) injection 150 mg    Follow up: 19mo

## 2018-05-04 DIAGNOSIS — H5213 Myopia, bilateral: Secondary | ICD-10-CM | POA: Diagnosis not present

## 2018-05-29 DIAGNOSIS — H5203 Hypermetropia, bilateral: Secondary | ICD-10-CM | POA: Diagnosis not present

## 2018-05-29 DIAGNOSIS — H52203 Unspecified astigmatism, bilateral: Secondary | ICD-10-CM | POA: Diagnosis not present

## 2018-06-12 ENCOUNTER — Other Ambulatory Visit (INDEPENDENT_AMBULATORY_CARE_PROVIDER_SITE_OTHER): Payer: Medicaid Other

## 2018-06-12 DIAGNOSIS — Z309 Encounter for contraceptive management, unspecified: Secondary | ICD-10-CM | POA: Diagnosis not present

## 2018-06-12 MED ORDER — MEDROXYPROGESTERONE ACETATE 150 MG/ML IM SUSP
150.0000 mg | Freq: Once | INTRAMUSCULAR | Status: AC
  Administered 2018-06-12: 150 mg via INTRAMUSCULAR

## 2018-06-12 NOTE — Progress Notes (Unsigned)
Due February 13th

## 2018-07-10 ENCOUNTER — Emergency Department (HOSPITAL_COMMUNITY): Payer: Medicaid Other

## 2018-07-10 ENCOUNTER — Emergency Department (HOSPITAL_COMMUNITY)
Admission: EM | Admit: 2018-07-10 | Discharge: 2018-07-11 | Disposition: A | Discharge status: Home / Self Care | Payer: Medicaid Other | Attending: Emergency Medicine | Admitting: Emergency Medicine

## 2018-07-10 ENCOUNTER — Encounter (HOSPITAL_COMMUNITY): Payer: Self-pay | Admitting: Emergency Medicine

## 2018-07-10 ENCOUNTER — Other Ambulatory Visit: Payer: Self-pay

## 2018-07-10 DIAGNOSIS — K828 Other specified diseases of gallbladder: Secondary | ICD-10-CM | POA: Diagnosis not present

## 2018-07-10 DIAGNOSIS — R6889 Other general symptoms and signs: Secondary | ICD-10-CM

## 2018-07-10 DIAGNOSIS — R1030 Lower abdominal pain, unspecified: Secondary | ICD-10-CM | POA: Diagnosis not present

## 2018-07-10 DIAGNOSIS — M7918 Myalgia, other site: Secondary | ICD-10-CM | POA: Diagnosis not present

## 2018-07-10 DIAGNOSIS — R51 Headache: Secondary | ICD-10-CM | POA: Insufficient documentation

## 2018-07-10 DIAGNOSIS — R112 Nausea with vomiting, unspecified: Secondary | ICD-10-CM | POA: Insufficient documentation

## 2018-07-10 DIAGNOSIS — R05 Cough: Secondary | ICD-10-CM | POA: Insufficient documentation

## 2018-07-10 DIAGNOSIS — R109 Unspecified abdominal pain: Secondary | ICD-10-CM | POA: Diagnosis not present

## 2018-07-10 DIAGNOSIS — R1110 Vomiting, unspecified: Secondary | ICD-10-CM | POA: Diagnosis not present

## 2018-07-10 DIAGNOSIS — K7689 Other specified diseases of liver: Secondary | ICD-10-CM | POA: Diagnosis not present

## 2018-07-10 DIAGNOSIS — J111 Influenza due to unidentified influenza virus with other respiratory manifestations: Secondary | ICD-10-CM | POA: Diagnosis not present

## 2018-07-10 LAB — CBC
HCT: 39.6 % (ref 36.0–46.0)
HEMOGLOBIN: 12.5 g/dL (ref 12.0–15.0)
MCH: 30.3 pg (ref 26.0–34.0)
MCHC: 31.6 g/dL (ref 30.0–36.0)
MCV: 95.9 fL (ref 80.0–100.0)
Platelets: 164 10*3/uL (ref 150–400)
RBC: 4.13 MIL/uL (ref 3.87–5.11)
RDW: 11.9 % (ref 11.5–15.5)
WBC: 8.2 10*3/uL (ref 4.0–10.5)
nRBC: 0 % (ref 0.0–0.2)

## 2018-07-10 LAB — URINALYSIS, ROUTINE W REFLEX MICROSCOPIC
Bilirubin Urine: NEGATIVE
Glucose, UA: NEGATIVE mg/dL
Hgb urine dipstick: NEGATIVE
Ketones, ur: NEGATIVE mg/dL
Leukocytes, UA: NEGATIVE
Nitrite: NEGATIVE
Protein, ur: NEGATIVE mg/dL
SPECIFIC GRAVITY, URINE: 1.011 (ref 1.005–1.030)
pH: 8 (ref 5.0–8.0)

## 2018-07-10 LAB — COMPREHENSIVE METABOLIC PANEL
ALT: 20 U/L (ref 0–44)
AST: 20 U/L (ref 15–41)
Albumin: 4.1 g/dL (ref 3.5–5.0)
Alkaline Phosphatase: 46 U/L (ref 38–126)
Anion gap: 10 (ref 5–15)
BUN: 9 mg/dL (ref 6–20)
CO2: 26 mmol/L (ref 22–32)
Calcium: 9.9 mg/dL (ref 8.9–10.3)
Chloride: 105 mmol/L (ref 98–111)
Creatinine, Ser: 0.68 mg/dL (ref 0.44–1.00)
GFR calc Af Amer: >60 mL/min (ref >60)
GFR calc non Af Amer: >60 mL/min (ref >60)
Glucose, Bld: 113 mg/dL — ABNORMAL HIGH (ref 70–99)
Potassium: 4.1 mmol/L (ref 3.5–5.1)
Sodium: 141 mmol/L (ref 135–145)
Total Bilirubin: 0.6 mg/dL (ref 0.3–1.2)
Total Protein: 7.5 g/dL (ref 6.5–8.1)

## 2018-07-10 LAB — I-STAT BETA HCG BLOOD, ED (MC, WL, AP ONLY): I-stat hCG, quantitative: <5 m[IU]/mL (ref <5)

## 2018-07-10 LAB — LIPASE, BLOOD: Lipase: 37 U/L (ref 11–51)

## 2018-07-10 MED ORDER — SODIUM CHLORIDE 0.9 % IV BOLUS
2000.0000 mL | Freq: Once | INTRAVENOUS | Status: AC
  Administered 2018-07-10: 2000 mL via INTRAVENOUS

## 2018-07-10 MED ORDER — KETOROLAC TROMETHAMINE 30 MG/ML IJ SOLN
30.0000 mg | Freq: Once | INTRAMUSCULAR | Status: AC
  Administered 2018-07-10: 30 mg via INTRAVENOUS
  Filled 2018-07-10: qty 1

## 2018-07-10 MED ORDER — METOCLOPRAMIDE HCL 5 MG/ML IJ SOLN
10.0000 mg | INTRAMUSCULAR | Status: AC
  Administered 2018-07-10: 10 mg via INTRAVENOUS
  Filled 2018-07-10: qty 2

## 2018-07-10 NOTE — ED Triage Notes (Addendum)
C/o bilateral flank pain that radiates to abd x 1 month.  C/o nausea, vomiting, headache, fever, dysuria, and dizziness x 3 days.  States she had a baby 9  Months ago and never started her menstrual cycles back.  States she never started breastfeeding.

## 2018-07-10 NOTE — ED Notes (Signed)
Patient transported to Ultrasound 

## 2018-07-10 NOTE — ED Provider Notes (Signed)
[facility] Provider Note   CSN: [id number] Arrival date & time: 07/10/18  2001     History   Chief Complaint Chief Complaint  Patient presents with  . Abdominal Pain  . Emesis  . Flank Pain    HPI Orlena Garmon is a 27 y.o. female.  27 year old female presents to the emergency department for evaluation of multiple complaints.  She notes a subjective fever over the past 3 days associated with a headache which has been throbbing.  Notes intermittent blurry vision when her headache worsens.  Has also had associated nausea with sporadic emesis.  Vomiting has been nonbloody and nonbilious.  Further reports cough as well as body aches.  Specifically, she notes cramping in her abdomen and bilateral flanks which waxes and wanes in severity.  She has been experiencing similar abdominal pain x 4 months, but this has become more constant x 1 month.  Feels the urge to void, but sometimes is unable to.  She has been taking Tylenol for symptoms, last 3 hours ago.  No syncope, vision loss, hematuria, extremity numbness or paresthesias, extremity weakness.  The history is provided by the patient. No language interpreter was used.  Abdominal Pain   Associated symptoms include vomiting.  Emesis   Associated symptoms include abdominal pain.  Flank Pain  Associated symptoms include abdominal pain.    Past Medical History:  Diagnosis Date  . Anemia   . Gestational trophoblastic neoplasm   . PICC (peripherally inserted central catheter) in place 12/24/2014  . Preterm labor   . Pyelonephritis     Patient Active Problem List   Diagnosis Date Noted  . Symptomatic anemia 10/08/2017  . Indication for care in labor or delivery 09/17/2017  . SVD (spontaneous vaginal delivery) 09/17/2017  . Uterine size date discrepancy 09/13/2017  . Poor weight gain of pregnancy, unspecified trimester 04/17/2017  . Iron deficiency anemia during pregnancy 04/17/2017  .  Supervision of high risk pregnancy, antepartum 03/05/2017  . History of gestational hypertension 03/05/2017  . Environmental and seasonal allergies 10/18/2014  . 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 History of poor fetal growth 06/04/2014  . Elevated DSR     Past Surgical History:  Procedure Laterality Date  . DILATION AND EVACUATION N/A 08/10/2014   Procedure: DILATATION AND EVACUATION;  Surgeon: Lavonia Drafts, MD;  Location: [facility];  Service: Gynecology;  Laterality: N/A;  . DILATION AND EVACUATION N/A 10/08/2017   Procedure: DILATATION AND EVACUATION;  Surgeon: Mora Bellman, MD;  Location: [facility];  Service: Gynecology;  Laterality: N/A;  . HYSTEROSCOPY N/A 08/10/2014   Procedure: HYSTEROSCOPY;  Surgeon: Lavonia Drafts, MD;  Location: [facility];  Service: Gynecology;  Laterality: N/A;     OB History    Gravida  2   Para  2   Term  2   Preterm      AB      Living  2     SAB      TAB      Ectopic      Multiple  0   Live Births  2            Home Medications    Prior to Admission medications   Medication Sig Start Date End Date Taking? Authorizing Provider  acetaminophen (TYLENOL) 500 MG tablet Take 500 mg by mouth every 6 (six) hours as needed for mild pain.   Yes [provider]  medroxyPROGESTERone (DEPO-PROVERA) 150 MG/ML injection Inject 150 mg  into the muscle every 3 (three) months.   Yes [provider]  norethindrone-ethinyl estradiol (LOESTRIN 1/20, 21,) 1-20 MG-MCG tablet Take 1 tablet by mouth daily. Patient not taking: Reported on 07/10/2018 11/28/17   Harland Dingwall L, NP-C  polyethylene glycol (MIRALAX / GLYCOLAX) packet Take 17 g by mouth daily. Patient not taking: Reported on 03/17/2018 10/24/17   Constant, Peggy, MD    Family History Family History  Problem Relation Age of Onset  . Alcohol abuse Neg Hx     Social History Social History   Tobacco Use  . Smoking status: Never Smoker  .  Smokeless tobacco: Never Used  Substance Use Topics  . Alcohol use: No  . Drug use: No     Allergies   Okra; Ondansetron; Phenergan [promethazine]; and Zofran [ondansetron hcl]   Review of Systems Review of Systems  Gastrointestinal: Positive for abdominal pain and vomiting.  Genitourinary: Positive for flank pain.  Ten systems reviewed and are negative for acute change, except as noted in the HPI.    Physical Exam Updated Vital Signs BP (!) 140/98 (BP Location: Right Arm)   Pulse 87   Temp (!) 97.5 F (36.4 C) (Oral)   Resp 19   SpO2 100%   Breastfeeding No   Physical Exam Vitals signs and nursing note reviewed.  Constitutional:      General: She is not in acute distress.    Appearance: She is well-developed. She is not diaphoretic.     Comments: Nontoxic appearing and in NAD  HENT:     Head: Normocephalic and atraumatic.     Mouth/Throat:     Mouth: Mucous membranes are moist.  Eyes:     General: No scleral icterus.    Conjunctiva/sclera: Conjunctivae normal.  Neck:     Musculoskeletal: Normal range of motion.     Comments: No meningismus Cardiovascular:     Rate and Rhythm: Normal rate and regular rhythm.     Pulses: Normal pulses.  Pulmonary:     Effort: Pulmonary effort is normal. No respiratory distress.     Breath sounds: No wheezing.     Comments: Respirations even and unlabored. Sporadic, dry cough. Abdominal:     Palpations: Abdomen is soft. There is no mass.     Tenderness: There is no guarding.     Comments: Soft abdomen with nonspecific, bilateral CVA TTP. No focal TTP in the anterior abdomen. No masses or peritoneal signs.  Musculoskeletal: Normal range of motion.  Skin:    General: Skin is warm and dry.     Coloration: Skin is not pale.     Findings: No erythema or rash.  Neurological:     General: No focal deficit present.     Mental Status: She is alert and oriented to person, place, and time.     Coordination: Coordination normal.      Comments: GCS 15. Speech is goal oriented. Follows commands and moving all extremities spontaneously. No peritoneal signs noted on exam.  Psychiatric:        Behavior: Behavior normal.      ED Treatments / Results  Labs (all labs ordered are listed, but only abnormal results are displayed) Labs Reviewed  COMPREHENSIVE METABOLIC PANEL - Abnormal; Notable for the following components:      Result Value   Glucose, Bld 113 (*)    All other components within normal limits  URINALYSIS, ROUTINE W REFLEX MICROSCOPIC - Abnormal; Notable for the following components:   Color, Urine  STRAW (*)    All other components within normal limits  LIPASE, BLOOD  CBC  I-STAT BETA HCG BLOOD, ED ([facility], [facility], [facility] ONLY)    EKG None  Radiology US Abdomen Complete  Result Date: 07/10/2018 CLINICAL DATA:  Abdominal pain EXAM: ABDOMEN ULTRASOUND COMPLETE COMPARISON:  None. FINDINGS: Gallbladder: Contracted. Slight increased wall thickness at 4.2 mm. No shadowing stone. Negative sonographic Murphy. Common bile duct: Diameter: Measuring up to 2.6 mm. Liver: Increased hepatic echogenicity without focal abnormality. Portal vein is patent on color Doppler imaging with normal direction of blood flow towards the liver. IVC: No abnormality visualized. Pancreas: Visualized portion unremarkable. Spleen: Size and appearance within normal limits. Right Kidney: Length: 10.5 cm. Echogenicity within normal limits. No mass or hydronephrosis visualized. Left Kidney: Length: 10.5 cm. Echogenicity within normal limits. No mass or hydronephrosis visualized. Abdominal aorta: No aneurysm visualized. Other findings: None. IMPRESSION: Contracted gallbladder with slight increased wall thickness but negative for stone disease or other evidence for acute cholecystitis. Gallbladder wall thickening is likely in part due to contracted state of the gallbladder Increased hepatic echogenicity suggesting steatosis Electronically Signed   By: Donavan Foil M.D.   On: 07/10/2018 23:54    Procedures Procedures (including critical care time)  Medications Ordered in ED Medications  sodium chloride 0.9 % bolus 2,000 mL (0 mLs Intravenous Stopped 07/11/18 0105)  ketorolac (TORADOL) 30 MG/ML injection 30 mg (30 mg Intravenous Given 07/10/18 2311)  metoCLOPramide (REGLAN) injection 10 mg (10 mg Intravenous Given 07/10/18 2311)     Initial Impression / Assessment and Plan / ED Course  I have reviewed the triage vital signs and the nursing notes.  Pertinent labs & imaging results that were available during my care of the patient were reviewed by me and considered in my medical decision making (see chart for details).     27 year old female presenting for multiple complaints.  Notes subjective fever, though she is afebrile in the ED today.  Hemodynamically stable.  Complaining of a headache with nausea, sporadic emesis, blurry vision.  She has not had any vomiting since arrival in the ED.  Neurologic exam nonfocal.  No nuchal rigidity or meningismus to suggest meningitis. On repeat assessment, the patient has had significant improvement in headache symptoms following a migraine cocktail.    She also notes worsening of back and abdominal pain.  She has been complaining of similar discomfort x4 months.  Feels that the pain is been more constant over the past 1 month.  Her laboratory evaluation today is reassuring without leukocytosis or electrolyte derangements.  Liver and kidney function preserved.  Her pregnancy is negative and UA does not suggest UTI.  Patient underwent abdominal ultrasound which is reassuring and without any acute process in the abdomen or pelvis.    Given symptomatic improvement and chronicity of abdominal and flank pain, I do not believe further emergent workup is indicated at this time.  Will discharge with prescriptions for Reglan, ibuprofen, Fioricet.  Encouraged primary care follow-up.  Return precautions discussed and  provided.  Patient discharged in stable condition with no unaddressed concerns.   Final Clinical Impressions(s) / ED Diagnoses   Final diagnoses:  Flu-like symptoms    ED Discharge Orders         Ordered    metoCLOPramide (REGLAN) 10 MG tablet  Every 6 hours PRN     07/11/18 0050    butalbital-acetaminophen-caffeine (FIORICET, ESGIC) 50-325-40 MG tablet  Every 8 hours PRN     07/11/18 0050  ibuprofen (ADVIL,MOTRIN) 600 MG tablet  Every 6 hours PRN     07/11/18 0050           Antonietta Breach, PA-C 07/12/18 2319    Isla Pence, MD 07/12/18 2340

## 2018-07-11 MED ORDER — IBUPROFEN 600 MG PO TABS: 600.0000 mg | ORAL_TABLET | Freq: Four times a day (QID) | ORAL | 0 refills | Status: DC | PRN

## 2018-07-11 MED ORDER — METOCLOPRAMIDE HCL 10 MG PO TABS: 10.0000 mg | ORAL_TABLET | Freq: Four times a day (QID) | ORAL | 0 refills | Status: DC | PRN

## 2018-07-11 MED ORDER — BUTALBITAL-APAP-CAFFEINE 50-325-40 MG PO TABS: 1.0000 | ORAL_TABLET | Freq: Three times a day (TID) | ORAL | 0 refills | Status: DC | PRN

## 2018-07-11 NOTE — Discharge Instructions (Signed)
Your symptoms are likely due to a viral illness. We advise ibuprofen every 6 hours as prescribed for body aches and pain. Use Fioricet for headache management as needed. You have been prescribed Reglan for management of nausea. Drink plenty of fluids to prevent dehydration. Follow-up with your primary care doctor in the next 24-48 hours for recheck. You may return for new or concerning symptoms.

## 2018-09-03 ENCOUNTER — Encounter: Payer: Self-pay | Admitting: Medical

## 2018-09-03 ENCOUNTER — Ambulatory Visit: Payer: Medicaid Other | Admitting: Medical

## 2018-09-03 ENCOUNTER — Encounter: Payer: Self-pay | Admitting: Family Medicine

## 2018-09-03 VITALS — BP 100/68 | HR 95 | Temp 98.0°F | Ht <= 58 in | Wt 104.6 lb

## 2018-09-03 DIAGNOSIS — R6889 Other general symptoms and signs: Secondary | ICD-10-CM | POA: Diagnosis not present

## 2018-09-03 DIAGNOSIS — Z309 Encounter for contraceptive management, unspecified: Secondary | ICD-10-CM | POA: Diagnosis not present

## 2018-09-03 DIAGNOSIS — R05 Cough: Secondary | ICD-10-CM | POA: Diagnosis not present

## 2018-09-03 DIAGNOSIS — R059 Cough, unspecified: Secondary | ICD-10-CM

## 2018-09-03 LAB — POCT URINE PREGNANCY: Preg Test, Ur: NEGATIVE

## 2018-09-03 LAB — POC INFLUENZA A&B (BINAX/QUICKVUE)
Influenza A, POC: NEGATIVE
Influenza B, POC: NEGATIVE

## 2018-09-03 MED ORDER — NORETHIN ACE-ETH ESTRAD-FE 1-20 MG-MCG PO TABS: 1.0000 | ORAL_TABLET | Freq: Every day | ORAL | 0 refills | Status: DC

## 2018-09-03 NOTE — Progress Notes (Signed)
Subjective:  Shelly Koch is a 28 y.o. female who presents for Chief Complaint  Patient presents with  . Contraception    discuss bc meds   . Cough    congestion with sore throat      Here to discuss contraception, but also sick.     She notes 3 day hx/o cough, sore throat, nose congestion, runny nose, itchy eyes, some body aches, chills, headache.  No fever, no NV.   Whole family is sick, one of her children with asthma as well.   She is using OTC remedy for sore throat and cough.   Wants to discuss birth control.  Has hx/o 2 pregnancies, 2 children.   Wants contraception for now.   Been doing depo provera injection.  She is due for Depo-Provera.   Wants to change to regular pills.  Was on a combined OCP last time that started with "N."  Was using [facility].   She tolerated this well.   LMP was 2018.   No current bleeding, no pelvic pain other than some cramping followed by back pain.   No vaginal discharge.   Been with boyfriend 6+ years.   No concern for STD.  No breast lumps or breast concern.    Checks breasts monthly.   Never smoker.   No hx/o blood clots or stroke.  No other aggravating or relieving factors.    No other c/o.  The following portions of the patient's history were reviewed and updated as appropriate: allergies, current medications, past family history, past medical history, past social history, past surgical history and problem list.  ROS Otherwise as in subjective above   Objective: BP 100/68   Pulse 95   Temp 98 F (36.7 C) (Oral)   Ht 4\' 10"  (1.473 m)   Wt 104 lb 9.6 oz (47.4 kg)   SpO2 98%   BMI 21.86 kg/m   Wt Readings from Last 3 Encounters:  09/03/18 104 lb 9.6 oz (47.4 kg)  03/17/18 102 lb 12.8 oz (46.6 kg)  11/28/17 107 lb 6.4 oz (48.7 kg)    General: somewhat Ill-appearing, well-developed, well-nourished Skin: warm, dry HEENT: Nose inflamed and congested, clear conjunctiva, TMs pearly, no sinus tenderness, pharynx with erythema,  no exudates Neck: Supple, non tender, shotty cervical adenopathy Heart: Regular rate and rhythm, normal S1, S2, no murmurs Lungs: Clear to auscultation bilaterally, no wheezes, rales, rhonchi Abdomen: Non tender non distended Extremities: Mild generalized tenderness    Assessment: Encounter Diagnoses  Name Primary?  . Cough Yes  . Encounter for contraceptive management, unspecified type   . Flu-like symptoms      Plan: Cough, flu like symptoms -her symptoms suggest flulike illness.  We discussed supportive care, rest, hydration,.  Of contagion, discussed possible complications.  She is past the window where Tamiflu would be of help.  Call or return if worse or not improving over the next 3 to 4 days  Discussed risk and benefits of OCPs, discussed options for contraception.  Prior has used Depo Provera, OCPs.  She says she will never use an IUD, has been on implantable birth control in the past but does not want that at this time, declines NuvaRing. Upreg negative.   Begin Junel below.  We deferred pap today given her flu like illness  She is due for pap, last 2017.  Plan to return for physical, breast/pap wihtin a month.   Shelly Koch was seen today for contraception and cough.  Diagnoses and all orders  for this visit:  Cough -     POC Influenza A&B (Binax test)  Encounter for contraceptive management, unspecified type -     POCT urine pregnancy  Flu-like symptoms  Other orders -     norethindrone-ethinyl estradiol (JUNEL FE 1/20) 1-20 MG-MCG tablet; Take 1 tablet by mouth daily.  Follow up: within 1 mo for physical, pap

## 2018-09-03 NOTE — Patient Instructions (Signed)
Your symptoms suggest flulike illness  Rest  Drink plenty of water throughout the day  You can use ibuprofen for fever, body aches, chills  You can continue over-the-counter remedies for cough and sore throat  Unfortunately the flu like symptoms can persist for 7 to 10 days.  So you probably will still feel sick the rest of the weekend  I gave you a work note as you should not really be at work the next 2 or 3 days getting other people sick as well  Contraception  Begin birth control pill this Sunday  I would recommend using condoms for the next few weeks to be extra careful  You are due for Pap smear.  Please schedule a physical to include breast and pelvic exam/Pap smear within the next month  Your last Pap smear was 2017   Contraception Choices Contraception, also called birth control, means things to use or ways to try not to get pregnant. Hormonal birth control This kind of birth control uses hormones. Here are some types of hormonal birth control:  A tube that is put under skin of the arm (implant). The tube can stay in for as long as 3 years.  Shots to get every 3 months (injections).  Pills to take every day (birth control pills).  A patch to change 1 time each week for 3 weeks (birth control patch). After that, the patch is taken off for 1 week.  A ring to put in the vagina. The ring is left in for 3 weeks. Then it is taken out of the vagina for 1 week. Then a new ring is put in.  Pills to take after unprotected sex (emergency birth control pills). Barrier birth control Here are some types of barrier birth control:  A thin covering that is put on the penis before sex (female condom). The covering is thrown away after sex.  A soft, loose covering that is put in the vagina before sex (female condom). The covering is thrown away after sex.  A rubber bowl that sits over the cervix (diaphragm). The bowl must be made for you. The bowl is put into the vagina before  sex. The bowl is left in for 6-8 hours after sex. It is taken out within 24 hours.  A small, soft cup that fits over the cervix (cervical cap). The cup must be made for you. The cup can be left in for 6-8 hours after sex. It is taken out within 48 hours.  A sponge that is put into the vagina before sex. It must be left in for at least 6 hours after sex. It must be taken out within 30 hours. Then it is thrown away.  A chemical that kills or stops sperm from getting into the uterus (spermicide). It may be a pill, cream, jelly, or foam to put in the vagina. The chemical should be used at least 10-15 minutes before sex. IUD (intrauterine) birth control An IUD is a small, T-shaped piece of plastic. It is put inside the uterus. There are two kinds:  Hormone IUD. This kind can stay in for 3-5 years.  Copper IUD. This kind can stay in for 10 years. Permanent birth control Here are some types of permanent birth control:  Surgery to block the fallopian tubes.  Having an insert put into each fallopian tube.  Surgery to tie off the tubes that carry sperm (vasectomy). Natural planning birth control Here are some types of natural planning birth control:  Not  having sex on the days the woman could get pregnant.  Using a calendar: ? To keep track of the length of each period. ? To find out what days pregnancy can happen. ? To plan to not have sex on days when pregnancy can happen.  Watching for symptoms of ovulation and not having sex during ovulation. One way the woman can check for ovulation is to check her temperature.  Waiting to have sex until after ovulation. Summary  Contraception, also called birth control, means things to use or ways to try not to get pregnant.  Hormonal methods of birth control include implants, injections, pills, patches, vaginal rings, and emergency birth control pills.  Barrier methods of birth control can include female condoms, female condoms, diaphragms,  cervical caps, sponges, and spermicides.  There are two types of IUD (intrauterine device) birth control. An IUD can be put in a woman's uterus to prevent pregnancy for 3-5 years.  Permanent sterilization can be done through a procedure for males, females, or both.  Natural planning methods involve not having sex on the days when the woman could get pregnant. This information is not intended to replace advice given to you by your health care provider. Make sure you discuss any questions you have with your health care provider. Document Released: 05/05/2009 Document Revised: 02/12/2018 Document Reviewed: 07/18/2016 Elsevier Interactive Patient Education  2019 [employer].

## 2018-09-04 ENCOUNTER — Other Ambulatory Visit: Payer: Medicaid Other

## 2018-09-23 ENCOUNTER — Other Ambulatory Visit: Payer: Self-pay | Admitting: Medical

## 2018-10-01 ENCOUNTER — Ambulatory Visit (HOSPITAL_COMMUNITY)
Admission: EM | Admit: 2018-10-01 | Discharge: 2018-10-01 | Disposition: A | Discharge status: Home / Self Care | Payer: Medicaid Other | Attending: Family Medicine | Admitting: Family Medicine

## 2018-10-01 ENCOUNTER — Other Ambulatory Visit: Payer: Self-pay

## 2018-10-01 ENCOUNTER — Encounter (HOSPITAL_COMMUNITY): Payer: Self-pay | Admitting: Emergency Medicine

## 2018-10-01 DIAGNOSIS — A084 Viral intestinal infection, unspecified: Secondary | ICD-10-CM | POA: Diagnosis not present

## 2018-10-01 MED ORDER — BUTALBITAL-APAP-CAFFEINE 50-325-40 MG PO TABS: 1.0000 | ORAL_TABLET | Freq: Four times a day (QID) | ORAL | 0 refills | Status: DC | PRN

## 2018-10-01 MED ORDER — METOCLOPRAMIDE HCL 10 MG PO TABS: 10.0000 mg | ORAL_TABLET | Freq: Four times a day (QID) | ORAL | 0 refills | Status: DC | PRN

## 2018-10-01 NOTE — ED Notes (Signed)
Patient able to ambulate independently  

## 2018-10-01 NOTE — ED Provider Notes (Signed)
[facility]    CSN: [id number] Arrival date & time: 10/01/18  1615     History   Chief Complaint Chief Complaint  Patient presents with  . Emesis  . Abdominal Pain    HPI Shelly Koch is a 28 y.o. female.   HPI Patient is here for illness since yesterday.  She has completed 4 times a day.  She has a headache.  She states that she has some epigastric pain.  She does not know whether it is a migraine is caused her to throw up, or stomach flu.  She does not have any medicine to take.  She was unable to get in with her PCP.  She needs a work note. Past Medical History:  Diagnosis Date  . Anemia   . Gestational trophoblastic neoplasm   . PICC (peripherally inserted central catheter) in place 12/24/2014  . Preterm labor   . Pyelonephritis     Patient Active Problem List   Diagnosis Date Noted  . Symptomatic anemia 10/08/2017  . Indication for care in labor or delivery 09/17/2017  . SVD (spontaneous vaginal delivery) 09/17/2017  . Uterine size date discrepancy 09/13/2017  . Poor weight gain of pregnancy, unspecified trimester 04/17/2017  . Iron deficiency anemia during pregnancy 04/17/2017  . Supervision of high risk pregnancy, antepartum 03/05/2017  . History of gestational hypertension 03/05/2017  . Environmental and seasonal allergies 10/18/2014  . 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 History of poor fetal growth 06/04/2014  . Elevated DSR     Past Surgical History:  Procedure Laterality Date  . DILATION AND EVACUATION N/A 08/10/2014   Procedure: DILATATION AND EVACUATION;  Surgeon: Lavonia Drafts, MD;  Location: [facility];  Service: Gynecology;  Laterality: N/A;  . DILATION AND EVACUATION N/A 10/08/2017   Procedure: DILATATION AND EVACUATION;  Surgeon: Mora Bellman, MD;  Location: [facility];  Service: Gynecology;  Laterality: N/A;  . HYSTEROSCOPY N/A 08/10/2014   Procedure: HYSTEROSCOPY;  Surgeon: Lavonia Drafts, MD;  Location: [facility];  Service: Gynecology;  Laterality: N/A;    OB History    Gravida  2   Para  2   Term  2   Preterm      AB      Living  2     SAB      TAB      Ectopic      Multiple  0   Live Births  2            Home Medications    Prior to Admission medications   Medication Sig Start Date End Date Taking? Authorizing Provider  JUNEL FE 1/20 1-20 MG-MCG tablet TAKE 1 TABLET BY MOUTH EVERY DAY 09/23/18  Yes Tysinger, Camelia Eng, PA-C  acetaminophen (TYLENOL) 500 MG tablet Take 500 mg by mouth every 6 (six) hours as needed for mild pain.    [provider]  butalbital-acetaminophen-caffeine Emelda Brothers, ESGIC) [phone] MG tablet Take 1-2 tablets by mouth every 6 (six) hours as needed for headache. 10/01/18 10/01/19  Raylene Everts, MD  metoCLOPramide (REGLAN) 10 MG tablet Take 1 tablet (10 mg total) by mouth every 6 (six) hours as needed for nausea. 10/01/18   Raylene Everts, MD    Family History Family History  Problem Relation Age of Onset  . Alcohol abuse Neg Hx     Social History Social History   Tobacco Use  . Smoking status: Never Smoker  . Smokeless tobacco: Never Used  Substance Use  Topics  . Alcohol use: No  . Drug use: No     Allergies   Okra; Ondansetron; Phenergan [promethazine]; and Zofran [ondansetron hcl]   Review of Systems Review of Systems  Constitutional: Negative for chills and fever.  HENT: Negative for ear pain and sore throat.   Eyes: Negative for pain and visual disturbance.  Respiratory: Negative for cough and shortness of breath.   Cardiovascular: Negative for chest pain and palpitations.  Gastrointestinal: Positive for abdominal pain and nausea. Negative for vomiting.  Genitourinary: Negative for dysuria and hematuria.  Musculoskeletal: Negative for arthralgias and back pain.  Skin: Negative for color change and rash.  Neurological: Positive for headaches. Negative for seizures and syncope.  All other systems reviewed  and are negative.    Physical Exam Triage Vital Signs ED Triage Vitals [10/01/18 1708]  Enc Vitals Group     BP 113/77     Pulse Rate (!) 102     Resp 16     Temp 98.2 F (36.8 C)     Temp Source Oral     SpO2 99 %     Weight      Height      Head Circumference      Peak Flow      Pain Score 8     Pain Loc      Pain Edu?      Excl. in [place]?    No data found.  Updated Vital Signs BP 113/77 (BP Location: Left Arm)   Pulse (!) 102   Temp 98.2 F (36.8 C) (Oral)   Resp 16   SpO2 99%      Physical Exam Constitutional:      General: She is not in acute distress.    Appearance: She is well-developed and normal weight.  HENT:     Head: Normocephalic and atraumatic.     Mouth/Throat:     Mouth: Mucous membranes are moist.  Eyes:     Extraocular Movements: Extraocular movements intact.     Conjunctiva/sclera: Conjunctivae normal.     Pupils: Pupils are equal, round, and reactive to light.  Neck:     Musculoskeletal: Normal range of motion.  Cardiovascular:     Rate and Rhythm: Normal rate and regular rhythm.     Heart sounds: Normal heart sounds.  Pulmonary:     Effort: Pulmonary effort is normal. No respiratory distress.     Breath sounds: Normal breath sounds.  Abdominal:     General: Abdomen is flat. Bowel sounds are normal. There is no distension.     Palpations: Abdomen is soft. There is no hepatomegaly or splenomegaly.     Tenderness: There is no abdominal tenderness.  Musculoskeletal: Normal range of motion.  Skin:    General: Skin is warm and dry.  Neurological:     General: No focal deficit present.     Mental Status: She is alert.  Psychiatric:        Mood and Affect: Mood normal.        Behavior: Behavior normal.      UC Treatments / Results  Labs (all labs ordered are listed, but only abnormal results are displayed) Labs Reviewed - No data to display  EKG None  Radiology No results found.  Procedures Procedures (including critical  care time)  Medications Ordered in UC Medications - No data to display  Initial Impression / Assessment and Plan / UC Course  I have reviewed the triage vital signs  and the nursing notes.  Pertinent labs & imaging results that were available during my care of the patient were reviewed by me and considered in my medical decision making (see chart for details).     Patient is allergic to both Phenergan and Zofran.  I will give her Reglan for her nausea and vomiting since she has taken this successfully in the past. I am giving her Fioricet for the headache.  Again, this is been prescribed with good results in the past. I encouraged her to follow-up with her primary care physician Final Clinical Impressions(s) / UC Diagnoses   Final diagnoses:  Viral gastroenteritis     Discharge Instructions     Take Reglan as needed for nausea and vomiting Take Fioricet as needed for headache Drink plenty of fluids, make sure you are not dehydrated Start eating bland diet as soon as you are able Avoid spicy foods and fried food for a week or 2 Return if you fail to improve   ED Prescriptions    Medication Sig Dispense Auth. Provider   metoCLOPramide (REGLAN) 10 MG tablet Take 1 tablet (10 mg total) by mouth every 6 (six) hours as needed for nausea. 12 tablet Raylene Everts, MD   butalbital-acetaminophen-caffeine (FIORICET, ESGIC) [phone] MG tablet Take 1-2 tablets by mouth every 6 (six) hours as needed for headache. 10 tablet Raylene Everts, MD     Controlled Substance Prescriptions  Controlled Substance Registry consulted? Not Applicable   Raylene Everts, MD 10/01/18 1752

## 2018-10-01 NOTE — Discharge Instructions (Signed)
Take Reglan as needed for nausea and vomiting Take Fioricet as needed for headache Drink plenty of fluids, make sure you are not dehydrated Start eating bland diet as soon as you are able Avoid spicy foods and fried food for a week or 2 Return if you fail to improve

## 2018-10-01 NOTE — ED Triage Notes (Signed)
Pt presents to [facility] for assessment of nausea, emesis, and epigastric pain x 24 hours.  States she has vomited 4 times in the last 12 hours.  Denies diarrhea.

## 2018-11-23 ENCOUNTER — Encounter: Payer: Self-pay | Admitting: Medical

## 2018-11-23 ENCOUNTER — Other Ambulatory Visit: Payer: Self-pay

## 2018-11-23 ENCOUNTER — Ambulatory Visit (INDEPENDENT_AMBULATORY_CARE_PROVIDER_SITE_OTHER): Payer: Medicaid Other | Admitting: Medical

## 2018-11-23 ENCOUNTER — Telehealth: Payer: Self-pay | Admitting: Medical

## 2018-11-23 VITALS — Temp 96.0°F | Ht <= 58 in | Wt 116.0 lb

## 2018-11-23 DIAGNOSIS — R300 Dysuria: Secondary | ICD-10-CM | POA: Insufficient documentation

## 2018-11-23 DIAGNOSIS — Z309 Encounter for contraceptive management, unspecified: Secondary | ICD-10-CM

## 2018-11-23 DIAGNOSIS — G8929 Other chronic pain: Secondary | ICD-10-CM

## 2018-11-23 DIAGNOSIS — R1030 Lower abdominal pain, unspecified: Secondary | ICD-10-CM

## 2018-11-23 DIAGNOSIS — R1084 Generalized abdominal pain: Secondary | ICD-10-CM

## 2018-11-23 DIAGNOSIS — R102 Pelvic and perineal pain: Secondary | ICD-10-CM

## 2018-11-23 DIAGNOSIS — M545 Low back pain, unspecified: Secondary | ICD-10-CM | POA: Insufficient documentation

## 2018-11-23 DIAGNOSIS — N911 Secondary amenorrhea: Secondary | ICD-10-CM | POA: Diagnosis not present

## 2018-11-23 DIAGNOSIS — Z124 Encounter for screening for malignant neoplasm of cervix: Secondary | ICD-10-CM | POA: Diagnosis not present

## 2018-11-23 DIAGNOSIS — R110 Nausea: Secondary | ICD-10-CM | POA: Diagnosis not present

## 2018-11-23 HISTORY — DX: Low back pain, unspecified: M54.50

## 2018-11-23 HISTORY — DX: Other chronic pain: G89.29

## 2018-11-23 NOTE — Progress Notes (Signed)
Subjective: Chief Complaint  Patient presents with  . Abdominal Pain    stomach pain, back pain, nausea, feels like labor pain when urine X tuesday   Of note, visit began with virtual consult on 11/23/18, but she came in to the office 11/24/18 for in person exam and eval.   Visit today regarding abdominal pain, back pain nausea and no periods.  She notes the last few days feeling discomfort with urination, urinary frequency, urgency.  She has had urinary tract infection in the past.  She thinks she may have a UTI.  No vaginal discharge.  Regarding back pain and abdominal pain, no injury, no fall, no trauma.  No blood in the urine, no vaginal bleeding.  She is active on the job walking and lifting things.  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  She has not had a period in 14 months since her last childbirth.  She has a history of gestational trophoblastic neoplasm.  She was followed by gynecology and released from their care about a year ago.  Since then she has been on Depo-Provera for a little while but did not want to continue this.  Back in February she switched over to oral contraceptives but still has not had a period despite being compliant with the medicine daily.  She is past due for Pap smear and we discussed this last visit.  Denies fever, no vomiting, no diarrhea, no constipation.  Regarding amenorrhea, no history of thyroid issues, eats 3 or more meals per day, noted extreme exercise, otherwise in usual state of health other than some baseline back pain in general and mild abdominal discomfort on a regular basis.  No new partners, is monogamous.  She has hx/o high risk pregnancy prior, poor fetal growth, gestational trophoblastic neoplasm, history of gestational hypertension, iron deficiency anemia.  She also has hx/o pyelonephritis in 2014, hospitalization  No other complaint.  Past Medical History:  Diagnosis Date  . Anemia   . Gestational trophoblastic neoplasm   . PICC  (peripherally inserted central catheter) in place 12/24/2014  . Preterm labor   . Pyelonephritis    Current Outpatient Medications on File Prior to Visit  Medication Sig Dispense Refill  . acetaminophen (TYLENOL) 500 MG tablet Take 500 mg by mouth every 6 (six) hours as needed for mild pain.    Lenda Kelp FE 1/20 1-20 MG-MCG tablet TAKE 1 TABLET BY MOUTH EVERY DAY 28 tablet 3  . butalbital-acetaminophen-caffeine (FIORICET, ESGIC) [phone] MG tablet Take 1-2 tablets by mouth every 6 (six) hours as needed for headache. (Patient not taking: Reported on 11/23/2018) 10 tablet 0  . metoCLOPramide (REGLAN) 10 MG tablet Take 1 tablet (10 mg total) by mouth every 6 (six) hours as needed for nausea. (Patient not taking: Reported on 11/23/2018) 12 tablet 0   No current facility-administered medications on file prior to visit.    Past Surgical History:  Procedure Laterality Date  . DILATION AND EVACUATION N/A 08/10/2014   Procedure: DILATATION AND EVACUATION;  Surgeon: Lavonia Drafts, MD;  Location: [facility];  Service: Gynecology;  Laterality: N/A;  . DILATION AND EVACUATION N/A 10/08/2017   Procedure: DILATATION AND EVACUATION;  Surgeon: Mora Bellman, MD;  Location: [facility];  Service: Gynecology;  Laterality: N/A;  . HYSTEROSCOPY N/A 08/10/2014   Procedure: HYSTEROSCOPY;  Surgeon: Lavonia Drafts, MD;  Location: [facility];  Service: Gynecology;  Laterality: N/A;     Objective Temp (!) 96 F (35.6 C) (Oral)   Ht 4\' 10"  (1.473 m)  Wt 116 lb (52.6 kg)   BMI 24.24 kg/m   Wt Readings from Last 3 Encounters:  11/23/18 116 lb (52.6 kg)  09/03/18 104 lb 9.6 oz (47.4 kg)  03/17/18 102 lb 12.8 oz (46.6 kg)   General appearance: alert,  WD/WN, somewhat ill appearing Oral cavity: MMM, no lesions Back  nontender Abdomen: +bs, soft, moderate tenderness in lower abdomen, some mild tenderness in mid upper abdomen,  non distended, no masses, no hepatomegaly, no splenomegaly Pulses: 2+ symmetric, upper  and lower extremities, normal cap refill Gyn: Normal external genitalia without lesions, vagina with normal mucosa, cervix with mild erythema, no cervical motion tenderness, no abnormal vaginal discharge.  Uterus and adnexa not enlarged, moderate tenderness over uterus and left adnexa, no masses.  Pap performed.  Exam chaperoned by nurse. Rectal: anus normal appearing     Assessment: Encounter Diagnoses  Name Primary?  . Dysuria Yes  . Chronic bilateral low back pain without sciatica   . Lower abdominal pain   . Secondary amenorrhea   . Encounter for contraceptive management, unspecified type   . Screening for cervical cancer   . Pelvic pain   . Nausea   . Generalized abdominal pain      Plan: She seems ill today.  Differential includes pyelonephritis, urinary tract infection, gynecological infection, pelvic inflammatory disease, other.  STAT labs today, will send urine for culture, pap sent as she is due.  She can use over-the-counter Emetrol for nausea.   She prior has had problems with both Zofran and promethazine.  Hydrate well, diet as tolerated.    Consider CT abdomen/pelvis.   Await STAT labs.  Shelly Koch was seen today for abdominal pain.  Diagnoses and all orders for this visit:  Dysuria -     CBC with Differential/Platelet -     Comprehensive metabolic panel -     Urine Culture  Chronic bilateral low back pain without sciatica  Lower abdominal pain -     CBC with Differential/Platelet -     Comprehensive metabolic panel  Secondary amenorrhea  Encounter for contraceptive management, unspecified type  Screening for cervical cancer -     Cytology - PAP(Gamaliel)  Pelvic pain -     CBC with Differential/Platelet -     Comprehensive metabolic panel -     Cytology - PAP([facility])  Nausea  Generalized abdominal pain -     CT Abdomen Pelvis W Contrast; Future

## 2018-11-23 NOTE — Telephone Encounter (Signed)
error 

## 2018-11-24 ENCOUNTER — Encounter: Payer: Self-pay | Admitting: Medical

## 2018-11-24 ENCOUNTER — Other Ambulatory Visit (HOSPITAL_COMMUNITY)
Admission: RE | Admit: 2018-11-24 | Discharge: 2018-11-24 | Disposition: A | Discharge status: Home / Self Care | Payer: Medicaid Other | Source: Ambulatory Visit | Attending: Medical | Admitting: Medical

## 2018-11-24 ENCOUNTER — Ambulatory Visit
Admission: RE | Admit: 2018-11-24 | Discharge: 2018-11-24 | Disposition: A | Discharge status: Home / Self Care | Payer: Medicaid Other | Source: Ambulatory Visit | Attending: Medical | Admitting: Medical

## 2018-11-24 ENCOUNTER — Other Ambulatory Visit: Payer: Self-pay | Admitting: Medical

## 2018-11-24 ENCOUNTER — Other Ambulatory Visit (INDEPENDENT_AMBULATORY_CARE_PROVIDER_SITE_OTHER): Payer: Medicaid Other

## 2018-11-24 VITALS — BP 100/70 | HR 84 | Temp 96.4°F | Resp 16

## 2018-11-24 DIAGNOSIS — O26899 Other specified pregnancy related conditions, unspecified trimester: Secondary | ICD-10-CM | POA: Insufficient documentation

## 2018-11-24 DIAGNOSIS — R102 Pelvic and perineal pain: Secondary | ICD-10-CM | POA: Insufficient documentation

## 2018-11-24 DIAGNOSIS — R109 Unspecified abdominal pain: Secondary | ICD-10-CM | POA: Diagnosis not present

## 2018-11-24 DIAGNOSIS — Z124 Encounter for screening for malignant neoplasm of cervix: Secondary | ICD-10-CM | POA: Diagnosis not present

## 2018-11-24 DIAGNOSIS — R1030 Lower abdominal pain, unspecified: Secondary | ICD-10-CM | POA: Diagnosis not present

## 2018-11-24 DIAGNOSIS — R1084 Generalized abdominal pain: Secondary | ICD-10-CM

## 2018-11-24 DIAGNOSIS — R110 Nausea: Secondary | ICD-10-CM | POA: Insufficient documentation

## 2018-11-24 DIAGNOSIS — R300 Dysuria: Secondary | ICD-10-CM | POA: Diagnosis not present

## 2018-11-24 LAB — COMPREHENSIVE METABOLIC PANEL
ALT: 21 IU/L (ref 0–32)
AST: 22 IU/L (ref 0–40)
Albumin/Globulin Ratio: 1.7 (ref 1.2–2.2)
Albumin: 4.4 g/dL (ref 3.9–5.0)
Alkaline Phosphatase: 53 IU/L (ref 39–117)
BUN/Creatinine Ratio: 17 (ref 9–23)
BUN: 9 mg/dL (ref 6–20)
Bilirubin Total: 0.4 mg/dL (ref 0.0–1.2)
CO2: 26 mmol/L (ref 20–29)
Calcium: 10.3 mg/dL — ABNORMAL HIGH (ref 8.7–10.2)
Chloride: 102 mmol/L (ref 96–106)
Creatinine, Ser: 0.52 mg/dL — ABNORMAL LOW (ref 0.57–1.00)
GFR calc Af Amer: 149 mL/min/{1.73_m2} (ref >59)
GFR calc non Af Amer: 130 mL/min/{1.73_m2} (ref >59)
Globulin, Total: 2.6 g/dL (ref 1.5–4.5)
Glucose: 116 mg/dL — ABNORMAL HIGH (ref 65–99)
Potassium: 4.2 mmol/L (ref 3.5–5.2)
Sodium: 139 mmol/L (ref 134–144)
Total Protein: 7 g/dL (ref 6.0–8.5)

## 2018-11-24 LAB — CBC WITH DIFFERENTIAL/PLATELET
Basophils Absolute: 0 10*3/uL (ref 0.0–0.2)
Basos: 0 %
EOS (ABSOLUTE): 0.3 10*3/uL (ref 0.0–0.4)
Eos: 5 %
Hematocrit: 39.2 % (ref 34.0–46.6)
Hemoglobin: 12.9 g/dL (ref 11.1–15.9)
Lymphocytes Absolute: 2.9 10*3/uL (ref 0.7–3.1)
Lymphs: 51 %
MCH: 31.4 pg (ref 26.6–33.0)
MCHC: 32.9 g/dL (ref 31.5–35.7)
MCV: 95 fL (ref 79–97)
Monocytes Absolute: 0.4 10*3/uL (ref 0.1–0.9)
Monocytes: 7 %
Neutrophils Absolute: 2.2 10*3/uL (ref 1.4–7.0)
Neutrophils: 37 %
Platelets: 174 10*3/uL (ref 150–450)
RBC: 4.11 x10E6/uL (ref 3.77–5.28)
RDW: 12.8 % (ref 11.7–15.4)
WBC: 5.8 10*3/uL (ref 3.4–10.8)

## 2018-11-24 LAB — POCT WET PREP (WET MOUNT)
Clue Cells Wet Prep Whiff POC: NEGATIVE
Trichomonas Wet Prep HPF POC: ABSENT

## 2018-11-24 LAB — POCT URINALYSIS DIP (PROADVANTAGE DEVICE)
Bilirubin, UA: NEGATIVE
Blood, UA: NEGATIVE
Glucose, UA: NEGATIVE mg/dL
Ketones, POC UA: NEGATIVE mg/dL
Leukocytes, UA: NEGATIVE
Nitrite, UA: NEGATIVE
Protein Ur, POC: NEGATIVE mg/dL
Specific Gravity, Urine: 1.02
Urobilinogen, Ur: NEGATIVE
pH, UA: 6 (ref 5.0–8.0)

## 2018-11-24 LAB — POCT URINE PREGNANCY: Preg Test, Ur: NEGATIVE

## 2018-11-24 MED ORDER — IOPAMIDOL (ISOVUE-300) INJECTION 61%
100.0000 mL | Freq: Once | INTRAVENOUS | Status: AC | PRN
  Administered 2018-11-24: 100 mL via INTRAVENOUS

## 2018-11-24 MED ORDER — SULFAMETHOXAZOLE-TRIMETHOPRIM 800-160 MG PO TABS: 1.0000 | ORAL_TABLET | Freq: Two times a day (BID) | ORAL | 0 refills | Status: DC

## 2018-11-25 ENCOUNTER — Telehealth: Payer: Self-pay | Admitting: Medical

## 2018-11-25 ENCOUNTER — Other Ambulatory Visit: Payer: Self-pay

## 2018-11-25 DIAGNOSIS — R1084 Generalized abdominal pain: Secondary | ICD-10-CM

## 2018-11-25 DIAGNOSIS — R300 Dysuria: Secondary | ICD-10-CM

## 2018-11-25 DIAGNOSIS — R1030 Lower abdominal pain, unspecified: Secondary | ICD-10-CM

## 2018-11-25 DIAGNOSIS — R102 Pelvic and perineal pain: Secondary | ICD-10-CM

## 2018-11-25 LAB — URINE CULTURE

## 2018-11-25 NOTE — Telephone Encounter (Signed)
Pt called & given results of CT scan and instructions.  She states she is still nauseous and having yellow vaginal discharge and would like referral to gynecologist.  She does not have one and whoever is fine.  She will call back within 48 hours with update

## 2018-11-27 LAB — CYTOLOGY - PAP
Chlamydia: NEGATIVE
Diagnosis: UNDETERMINED — AB
HPV: NOT DETECTED
Neisseria Gonorrhea: NEGATIVE

## 2018-12-02 ENCOUNTER — Encounter (HOSPITAL_COMMUNITY): Payer: Self-pay

## 2018-12-02 ENCOUNTER — Emergency Department (HOSPITAL_COMMUNITY): Payer: Medicaid Other

## 2018-12-02 ENCOUNTER — Emergency Department (HOSPITAL_COMMUNITY)
Admission: EM | Admit: 2018-12-02 | Discharge: 2018-12-03 | Disposition: A | Discharge status: Home / Self Care | Payer: Medicaid Other | Attending: Emergency Medicine | Admitting: Emergency Medicine

## 2018-12-02 DIAGNOSIS — R0789 Other chest pain: Secondary | ICD-10-CM | POA: Diagnosis not present

## 2018-12-02 DIAGNOSIS — Z79899 Other long term (current) drug therapy: Secondary | ICD-10-CM | POA: Diagnosis not present

## 2018-12-02 DIAGNOSIS — R079 Chest pain, unspecified: Secondary | ICD-10-CM

## 2018-12-02 DIAGNOSIS — R51 Headache: Secondary | ICD-10-CM | POA: Diagnosis not present

## 2018-12-02 DIAGNOSIS — R519 Headache, unspecified: Secondary | ICD-10-CM

## 2018-12-02 LAB — CBC
HCT: 35.9 % — ABNORMAL LOW (ref 36.0–46.0)
Hemoglobin: 11.9 g/dL — ABNORMAL LOW (ref 12.0–15.0)
MCH: 31.5 pg (ref 26.0–34.0)
MCHC: 33.1 g/dL (ref 30.0–36.0)
MCV: 95 fL (ref 80.0–100.0)
Platelets: 163 10*3/uL (ref 150–400)
RBC: 3.78 MIL/uL — ABNORMAL LOW (ref 3.87–5.11)
RDW: 11.6 % (ref 11.5–15.5)
WBC: 7.7 10*3/uL (ref 4.0–10.5)
nRBC: 0 % (ref 0.0–0.2)

## 2018-12-02 LAB — BASIC METABOLIC PANEL
Anion gap: 9 (ref 5–15)
BUN: 5 mg/dL — ABNORMAL LOW (ref 6–20)
CO2: 25 mmol/L (ref 22–32)
Calcium: 9.5 mg/dL (ref 8.9–10.3)
Chloride: 103 mmol/L (ref 98–111)
Creatinine, Ser: 0.51 mg/dL (ref 0.44–1.00)
GFR calc Af Amer: >60 mL/min (ref >60)
GFR calc non Af Amer: >60 mL/min (ref >60)
Glucose, Bld: 121 mg/dL — ABNORMAL HIGH (ref 70–99)
Potassium: 4.1 mmol/L (ref 3.5–5.1)
Sodium: 137 mmol/L (ref 135–145)

## 2018-12-02 LAB — I-STAT BETA HCG BLOOD, ED (MC, WL, AP ONLY): I-stat hCG, quantitative: <5 m[IU]/mL (ref <5)

## 2018-12-02 LAB — TROPONIN I: Troponin I: <0.03 ng/mL (ref <0.03)

## 2018-12-02 MED ORDER — BUTALBITAL-APAP-CAFFEINE 50-325-40 MG PO TABS
1.0000 | ORAL_TABLET | Freq: Once | ORAL | Status: AC
  Administered 2018-12-02: 1 via ORAL
  Filled 2018-12-02: qty 1

## 2018-12-02 MED ORDER — SODIUM CHLORIDE 0.9% FLUSH: 3.0000 mL | Freq: Once | INTRAVENOUS | Status: DC

## 2018-12-02 MED ORDER — BUTALBITAL-APAP-CAFFEINE 50-325-40 MG PO TABS: 1.0000 | ORAL_TABLET | Freq: Four times a day (QID) | ORAL | 0 refills | Status: DC | PRN

## 2018-12-02 NOTE — Discharge Instructions (Signed)
Take the prescribed medication as directed. °Follow-up with your primary care doctor. °Return to the ED for new or worsening symptoms. °

## 2018-12-02 NOTE — ED Triage Notes (Signed)
Pt states that she has had a headache since yesterday and central CP that started this afternoon, denies other cardiac symptoms

## 2018-12-02 NOTE — ED Notes (Signed)
See the pas notes she saw before myself

## 2018-12-02 NOTE — ED Provider Notes (Signed)
[facility] EMERGENCY DEPARTMENT Provider Note   CSN: [id number] Arrival date & time: 12/02/18  2109    History   Chief Complaint Chief Complaint  Patient presents with  . Chest Pain    HPI Shelly Koch is a 28 y.o. female.     The history is provided by the patient and medical records.  Chest Pain  Associated symptoms: headache      28 year old female with history of anemia, migraine headaches, presenting to the ED with complaints.  1.  Headache--this has been intermittent for the past few days.  Headache localized to the back of her head as well as along the forehead.  This is throbbing in nature.  She denies any associated blurred vision, numbness, weakness, dizziness, confusion, nausea, or vomiting.  She is not had any falls or head trauma.  She is not currently on anticoagulation.  States she was told in the past she had migraines and was prescribed some oral medication for this but has not had any in several months.  Headache feels like prior migraine.  She does report she is been working on a computer a lot and thinks this may be exacerbating her pain.  2.  Chest pain--this began yesterday.  States she notices this more when she is up moving around or doing a lot of hard housework.  She denies any SOB, cough, fever, or other URI symptoms.  She has no known cardiac history.  No significant family cardiac history.  She is not a smoker.  No illicit drug use.    Past Medical History:  Diagnosis Date  . Anemia   . Gestational trophoblastic neoplasm   . PICC (peripherally inserted central catheter) in place 12/24/2014  . Preterm labor   . Pyelonephritis     Patient Active Problem List   Diagnosis Date Noted  . Pelvic pain 11/24/2018  . Nausea 11/24/2018  . Dysuria 11/23/2018  . Chronic bilateral low back pain without sciatica 11/23/2018  . Secondary amenorrhea 11/23/2018  . Screening for cervical cancer 11/23/2018  . Symptomatic anemia 10/08/2017  .  Indication for care in labor or delivery 09/17/2017  . SVD (spontaneous vaginal delivery) 09/17/2017  . Uterine size date discrepancy 09/13/2017  . Poor weight gain of pregnancy, unspecified trimester 04/17/2017  . Iron deficiency anemia during pregnancy 04/17/2017  . Supervision of high risk pregnancy, antepartum 03/05/2017  . History of gestational hypertension 03/05/2017  . Lower abdominal pain 12/14/2014  . Encounter for contraceptive management 11/23/2014  . Environmental and seasonal allergies 10/18/2014  . 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 History of poor fetal growth 06/04/2014  . Elevated DSR     Past Surgical History:  Procedure Laterality Date  . DILATION AND EVACUATION N/A 08/10/2014   Procedure: DILATATION AND EVACUATION;  Surgeon: Lavonia Drafts, MD;  Location: [facility];  Service: Gynecology;  Laterality: N/A;  . DILATION AND EVACUATION N/A 10/08/2017   Procedure: DILATATION AND EVACUATION;  Surgeon: Mora Bellman, MD;  Location: [facility] [facility];  Service: Gynecology;  Laterality: N/A;  . HYSTEROSCOPY N/A 08/10/2014   Procedure: HYSTEROSCOPY;  Surgeon: Lavonia Drafts, MD;  Location: [place] [facility];  Service: Gynecology;  Laterality: N/A;     OB History    Gravida  2   Para  2   Term  2   Preterm      AB      Living  2     SAB      TAB  Ectopic      Multiple  0   Live Births  2            Home Medications    Prior to Admission medications   Medication Sig Start Date End Date Taking? Authorizing Provider  acetaminophen (TYLENOL) 500 MG tablet Take 500 mg by mouth every 6 (six) hours as needed for mild pain.    [provider]  butalbital-acetaminophen-caffeine (FIORICET, ESGIC) 50-325-40 MG tablet Take 1-2 tablets by mouth every 6 (six) hours as needed for headache. Patient not taking: Reported on 11/23/2018 10/01/18 10/01/19  Raylene Everts, MD  JUNEL FE 1/20 1-20 MG-MCG tablet TAKE 1 TABLET BY MOUTH EVERY DAY 09/23/18    Tysinger, Camelia Eng, PA-C  metoCLOPramide (REGLAN) 10 MG tablet Take 1 tablet (10 mg total) by mouth every 6 (six) hours as needed for nausea. Patient not taking: Reported on 11/23/2018 10/01/18   Raylene Everts, MD  sulfamethoxazole-trimethoprim (BACTRIM DS) 800-160 MG tablet Take 1 tablet by mouth 2 (two) times daily. 11/24/18   Tysinger, Camelia Eng, PA-C    Family History Family History  Problem Relation Age of Onset  . Alcohol abuse Neg Hx     Social History Social History   Tobacco Use  . Smoking status: Never Smoker  . Smokeless tobacco: Never Used  Substance Use Topics  . Alcohol use: No  . Drug use: No     Allergies   Okra; Ondansetron; Phenergan [promethazine]; and Zofran [ondansetron hcl]   Review of Systems Review of Systems  Cardiovascular: Positive for chest pain.  Neurological: Positive for headaches.  All other systems reviewed and are negative.    Physical Exam Updated Vital Signs BP 121/87 (BP Location: Right Arm)   Pulse 78   Temp 97.9 F (36.6 C) (Oral)   Resp (!) 21   SpO2 100%   Physical Exam Vitals signs and nursing note reviewed.  Constitutional:      General: She is not in acute distress.    Appearance: She is well-developed. She is not diaphoretic.  HENT:     Head: Normocephalic and atraumatic.     Right Ear: External ear normal.     Left Ear: External ear normal.  Eyes:     Conjunctiva/sclera: Conjunctivae normal.     Pupils: Pupils are equal, round, and reactive to light.  Neck:     Musculoskeletal: Full passive range of motion without pain, normal range of motion and neck supple. No neck rigidity.     Comments: No rigidity, no meningismus Cardiovascular:     Rate and Rhythm: Normal rate and regular rhythm.     Heart sounds: Normal heart sounds. No murmur.  Pulmonary:     Effort: Pulmonary effort is normal. No respiratory distress.     Breath sounds: Normal breath sounds. No wheezing or rhonchi.  Chest:     Comments: Chest wall  is non-tender Abdominal:     General: Bowel sounds are normal.     Palpations: Abdomen is soft.     Tenderness: There is no abdominal tenderness. There is no guarding.  Musculoskeletal: Normal range of motion.  Skin:    General: Skin is warm and dry.     Findings: No rash.  Neurological:     Mental Status: She is alert and oriented to person, place, and time.     Cranial Nerves: No cranial nerve deficit.     Sensory: No sensory deficit.     Motor: No tremor or  seizure activity.     Comments: AAOx3, answering questions and following commands appropriately; equal strength UE and LE bilaterally; CN grossly intact; moves all extremities appropriately without ataxia; no focal neuro deficits or facial asymmetry appreciated  Psychiatric:        Behavior: Behavior normal.        Thought Content: Thought content normal.      ED Treatments / Results  Labs (all labs ordered are listed, but only abnormal results are displayed) Labs Reviewed  BASIC METABOLIC PANEL - Abnormal; Notable for the following components:      Result Value   Glucose, Bld 121 (*)    BUN 5 (*)    All other components within normal limits  CBC - Abnormal; Notable for the following components:   RBC 3.78 (*)    Hemoglobin 11.9 (*)    HCT 35.9 (*)    All other components within normal limits  TROPONIN I  I-STAT BETA HCG BLOOD, ED ([facility], [facility], [facility] ONLY)    EKG  Radiology Dg Chest 2 View  Result Date: 12/02/2018 CLINICAL DATA:  Headache and central chest pain. EXAM: CHEST - 2 VIEW COMPARISON:  None. FINDINGS: Heart size is normal. Mediastinal shadows are normal. The lungs are clear. No bronchial thickening. No infiltrate, mass, effusion or collapse. Pulmonary vascularity is normal. No bony abnormality. IMPRESSION: Normal chest Electronically Signed   By: Nelson Chimes M.D.   On: 12/02/2018 21:50    Procedures Procedures (including critical care time)  ED ECG REPORT   Date: 12/02/2018  Rate: 80  Rhythm: NSR  QRS  Axis: normal  Intervals: normal  ST/T Wave abnormalities: normal  Conduction Disutrbances:none  Narrative Interpretation:   Old EKG Reviewed: unchanged  I have personally reviewed the EKG tracing and agree with the computerized printout as noted.   Medications Ordered in ED Medications  sodium chloride flush (NS) 0.9 % injection 3 mL (has no administration in time range)  butalbital-acetaminophen-caffeine (FIORICET) 50-325-40 MG per tablet 1 tablet (1 tablet Oral Given 12/02/18 2306)     Initial Impression / Assessment and Plan / ED Course  I have reviewed the triage vital signs and the nursing notes.  Pertinent labs & imaging results that were available during my care of the patient were reviewed by me and considered in my medical decision making (see chart for details).  28 y.o. F here with 2 complaints.  1.  Headache-- history of migraines, states it feels similar.  No dizziness, confusion, numbness, or weakness.  No blurred vision.  Not on anticoagulation.  No falls or trauma.  Neurologic exam here is non-focal.  No fever or nuchal rigidiy, no meningeal signs on exam.   Low suspicion for acute intracranial process/abnormality.  She requested treatment with oral medications.  Per chart review has responded well to Fioricet so given dose of that here.  Prescription for same to continue PRN.  2.   Chest pain-- began yesterday, worse with heavy labor/housework.  No SOB, palpitations, dizziness, fever, or cough.  No sick contacts.  EKG NSR without acute ischemic changes.  Labs reassuring, trop negative.  She is PERC negative.  Low suspicion for ACS, PE, dissection, etc.  May be musculoskeletal.  Recommended symptomatic treatment.  Follow-up with PCP.  Return here for any new or acute changes.  Final Clinical Impressions(s) / ED Diagnoses   Final diagnoses:  Bad headache  Chest pain in adult    ED Discharge Orders  Ordered    butalbital-acetaminophen-caffeine (FIORICET)  50-325-40 MG tablet  Every 6 hours PRN     12/02/18 2335           Larene Pickett, PA-C 12/03/18 0017    Blanchie Dessert, MD 12/08/18 [phone]

## 2018-12-07 ENCOUNTER — Telehealth: Payer: Self-pay | Admitting: *Deleted

## 2018-12-07 NOTE — Telephone Encounter (Signed)
Patient called and referral was placed to see Dr Sumner Boast (GYN) they do not take her medicaid so I will send new referral via fax and through [place] to [facility].

## 2018-12-21 ENCOUNTER — Telehealth (INDEPENDENT_AMBULATORY_CARE_PROVIDER_SITE_OTHER): Payer: Medicaid Other | Admitting: Obstetrics & Gynecology

## 2018-12-21 ENCOUNTER — Encounter: Payer: Self-pay | Admitting: Obstetrics & Gynecology

## 2018-12-21 ENCOUNTER — Other Ambulatory Visit: Payer: Self-pay

## 2018-12-21 DIAGNOSIS — R102 Pelvic and perineal pain: Secondary | ICD-10-CM | POA: Diagnosis not present

## 2018-12-21 DIAGNOSIS — N911 Secondary amenorrhea: Secondary | ICD-10-CM

## 2018-12-21 NOTE — Progress Notes (Signed)
I connected with  Shelly Koch on 12/21/18 at  3:10 PM EDT by telephone and verified that I am speaking with the correct person using two identifiers.   I discussed the limitations, risks, security and privacy concerns of performing an evaluation and management service by telephone and the availability of in person appointments. I also discussed with the patient that there may be a patient responsible charge related to this service. The patient expressed understanding and agreed to proceed.  Dolores Hoose, RN 12/21/2018  3:16 PM   Pelvic pain, dull, constant.  Cramping pain when she finishes OCP each month.  Pt states she has not had a period since her last daughter was born. Pt takes tylenol as needed. Pt states it has been going on for 2 months.

## 2018-12-21 NOTE — Progress Notes (Signed)
TELEHEALTH GYNECOLOGY VIRTUAL VIDEO VISIT ENCOUNTER NOTE  Provider location: [facility] at [facility]   I connected with Shelly Koch on 12/21/18 at  3:10 PM EDT by MyChart Video Encounter at home and verified that I am speaking with the correct person using two identifiers.   I discussed the limitations, risks, security and privacy concerns of performing an evaluation and management service by telephone and the availability of in person appointments. I also discussed with the patient that there may be a patient responsible charge related to this service. The patient expressed understanding and agreed to proceed.   History:  Shelly Koch is a 28 y.o. [id number] female being evaluated today for pelvic pain for 2 months.  She denies any abnormal vaginal discharge, bleeding or other concerns.   The pain gets better with tylenol but, it comes back when the pain meds wear off.    Pt pain in the left > right it is under the umbilicus.  Pt has not had a period since she delivered. She is on OCPs but, she has not had menses for 15 months. She feels sx of menses but, no bleeding. Pt is on Junelle.       Past Medical History:  Diagnosis Date  . Anemia   . Gestational trophoblastic neoplasm   . PICC (peripherally inserted central catheter) in place 12/24/2014  . Preterm labor   . Pyelonephritis    Past Surgical History:  Procedure Laterality Date  . DILATION AND EVACUATION N/A 08/10/2014   Procedure: DILATATION AND EVACUATION;  Surgeon: Lavonia Drafts, MD;  Location: [facility];  Service: Gynecology;  Laterality: N/A;  . DILATION AND EVACUATION N/A 10/08/2017   Procedure: DILATATION AND EVACUATION;  Surgeon: Mora Bellman, MD;  Location: [facility];  Service: Gynecology;  Laterality: N/A;  . HYSTEROSCOPY N/A 08/10/2014   Procedure: HYSTEROSCOPY;  Surgeon: Lavonia Drafts, MD;  Location: [facility] [facility];  Service: Gynecology;  Laterality: N/A;   The following portions of the  patient's history were reviewed and updated as appropriate: allergies, current medications, past family history, past medical history, past social history, past surgical history and problem list.   Health Maintenance:  Abnormal pap (ASCUS) and negative HRHPV on 11/24/2018.    Review of Systems:  Pertinent items noted in HPI and remainder of comprehensive ROS otherwise negative.  Physical Exam:   General:  Alert, oriented and cooperative. Patient appears to be in no acute distress.  Mental Status: Normal mood and affect. Normal behavior. Normal judgment and thought content.   Respiratory: Normal respiratory effort, no problems with respiration noted  Rest of physical exam deferred due to type of encounter  Labs and Imaging No results found for this or any previous visit (from the past 336 hour(s)). Dg Chest 2 View  Result Date: 12/02/2018 CLINICAL DATA:  Headache and central chest pain. EXAM: CHEST - 2 VIEW COMPARISON:  None. FINDINGS: Heart size is normal. Mediastinal shadows are normal. The lungs are clear. No bronchial thickening. No infiltrate, mass, effusion or collapse. Pulmonary vascularity is normal. No bony abnormality. IMPRESSION: Normal chest Electronically Signed   By: Nelson Chimes M.D.   On: 12/02/2018 21:50   Ct Abdomen Pelvis W Contrast  Result Date: 11/24/2018 CLINICAL DATA:  Abdominal pain.  Fever.  Abscess suspected. EXAM: CT ABDOMEN AND PELVIS WITH CONTRAST TECHNIQUE: Multidetector CT imaging of the abdomen and pelvis was performed using the standard protocol following bolus administration of intravenous contrast. CONTRAST:  126mL ISOVUE-300 IOPAMIDOL (ISOVUE-300) INJECTION  61% COMPARISON:  None. FINDINGS: Lower chest: No acute abnormality. Hepatobiliary: No focal liver abnormality is seen. No gallstones, gallbladder wall thickening, or biliary dilatation. Pancreas: Unremarkable. No pancreatic ductal dilatation or surrounding inflammatory changes. Spleen: Normal in size without  focal abnormality. Adrenals/Urinary Tract: Adrenal glands are unremarkable. Kidneys are normal, without renal calculi, focal lesion, or hydronephrosis. Bladder is unremarkable. Stomach/Bowel: The appearance of the transverse colon, descending colon, and sigmoid colon are favored to be secondary to underdistention, less likely colitis. There is no evidence of a small-bowel obstruction. The appendix is unremarkable. Vascular/Lymphatic: No significant vascular findings are present. No enlarged abdominal or pelvic lymph nodes. 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 Reproductive: Uterus and bilateral adnexa are unremarkable. Other: No abdominal wall hernia or abnormality. No abdominopelvic ascites. Musculoskeletal: No acute or significant osseous findings. IMPRESSION: 1. No acute intra-abdominal abnormality detected to explain the patient's abdominal pain. 2. 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 Electronically Signed   By: Constance Holster M.D.   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  Need to look for other factors such as pill amenorrhea as well.       Labs in the am: HCG, GH, TSH, ACTH, Prolactin, FSH        I discussed the assessment and treatment plan with the patient. The patient was provided an opportunity to ask questions and all were answered. The patient agreed with the plan and demonstrated an understanding of the instructions.   The patient was advised to call back or seek an in-person evaluation/go to the ED if the symptoms worsen or if the condition fails to improve as anticipated.  I provided 17 minutes of face-to-face time during this encounter.   Lavonia Drafts, MD [facility], [place]

## 2018-12-22 ENCOUNTER — Telehealth: Payer: Self-pay | Admitting: *Deleted

## 2018-12-22 ENCOUNTER — Other Ambulatory Visit: Payer: Medicaid Other

## 2018-12-22 ENCOUNTER — Telehealth: Payer: Self-pay

## 2018-12-22 DIAGNOSIS — N911 Secondary amenorrhea: Secondary | ICD-10-CM | POA: Diagnosis not present

## 2018-12-22 NOTE — Telephone Encounter (Signed)
Called pt to inform her that per Crystal the phlebotomist with [facility], the pt's blood clotted and she will need to come in and have the lab redrawn.  Pt states she is unable to come in this afternoon d/t work, is unable to come in tomorrow, 6/3, but can come in Thursday 6/4.  Appointment schedule for Thursday 6/4 @ 1100.  Pt verbalized understanding.

## 2018-12-22 NOTE — Telephone Encounter (Signed)
Called pt per Crystal, Gaffer, that the pt needed to come back so that we redraw a tube of blood that had clotted.  Informed pt that we needed to ask if we could have her come back to redraw her blood.  Pt stated that she would be able to come in on Thursday but not able to give time.  I advised the pt that we are closed from 12-1 for lunch. Pt stated understanding with no further questions.

## 2018-12-23 ENCOUNTER — Other Ambulatory Visit: Payer: Self-pay | Admitting: *Deleted

## 2018-12-23 DIAGNOSIS — N911 Secondary amenorrhea: Secondary | ICD-10-CM

## 2018-12-24 ENCOUNTER — Encounter: Payer: Self-pay | Admitting: Obstetrics & Gynecology

## 2018-12-24 ENCOUNTER — Other Ambulatory Visit: Payer: Self-pay

## 2018-12-24 ENCOUNTER — Other Ambulatory Visit: Payer: Medicaid Other

## 2018-12-24 DIAGNOSIS — N911 Secondary amenorrhea: Secondary | ICD-10-CM

## 2018-12-24 LAB — PROLACTIN: Prolactin: 10.2 ng/mL (ref 4.8–23.3)

## 2018-12-24 LAB — ACTH

## 2018-12-24 LAB — FOLLICLE STIMULATING HORMONE: FSH: 4.4 m[IU]/mL

## 2018-12-24 LAB — TSH: TSH: 1.73 u[IU]/mL (ref 0.450–4.500)

## 2018-12-24 LAB — GROWTH HORMONE: Growth Hormone: 0.1 ng/mL (ref 0.0–10.0)

## 2018-12-24 LAB — BETA HCG QUANT (REF LAB): hCG Quant: <1 m[IU]/mL

## 2018-12-25 LAB — ACTH: ACTH: 13.7 pg/mL (ref 7.2–63.3)

## 2018-12-25 LAB — PROLACTIN: Prolactin: 6.8 ng/mL (ref 4.8–23.3)

## 2018-12-25 LAB — TSH: TSH: 1.63 u[IU]/mL (ref 0.450–4.500)

## 2018-12-25 LAB — BETA HCG QUANT (REF LAB): hCG Quant: <1 m[IU]/mL

## 2018-12-25 LAB — GROWTH HORMONE: Growth Hormone: <0.1 ng/mL (ref 0.0–10.0)

## 2018-12-25 LAB — FOLLICLE STIMULATING HORMONE: FSH: 6 m[IU]/mL

## 2018-12-29 ENCOUNTER — Encounter: Payer: Self-pay | Admitting: *Deleted

## 2019-01-07 ENCOUNTER — Telehealth: Payer: Self-pay

## 2019-01-07 NOTE — Telephone Encounter (Signed)
Pt called Nurse line today at 11:34a wanting test results from 06/04. Called pt to advise all test came back Normal, Pt states having problems with Bladder, advised will have front desk to call her with an appt for Nurse Visit. Pt verbalized understanding.

## 2019-01-13 ENCOUNTER — Ambulatory Visit: Payer: Medicaid Other

## 2019-01-15 ENCOUNTER — Ambulatory Visit (INDEPENDENT_AMBULATORY_CARE_PROVIDER_SITE_OTHER): Payer: Medicaid Other

## 2019-01-15 ENCOUNTER — Other Ambulatory Visit: Payer: Self-pay

## 2019-01-15 ENCOUNTER — Ambulatory Visit: Payer: Medicaid Other

## 2019-01-15 DIAGNOSIS — N390 Urinary tract infection, site not specified: Secondary | ICD-10-CM | POA: Diagnosis not present

## 2019-01-15 LAB — POCT URINALYSIS DIP (DEVICE)
Bilirubin Urine: NEGATIVE
Glucose, UA: NEGATIVE mg/dL
Hgb urine dipstick: NEGATIVE
Ketones, ur: NEGATIVE mg/dL
Leukocytes,Ua: NEGATIVE
Nitrite: NEGATIVE
Protein, ur: NEGATIVE mg/dL
Specific Gravity, Urine: 1.02 (ref 1.005–1.030)
Urobilinogen, UA: 0.2 mg/dL (ref 0.0–1.0)
pH: 6 (ref 5.0–8.0)

## 2019-01-15 MED ORDER — SULFAMETHOXAZOLE-TRIMETHOPRIM 800-160 MG PO TABS: 1.0000 | ORAL_TABLET | Freq: Two times a day (BID) | ORAL | 0 refills | Status: DC

## 2019-01-15 NOTE — Progress Notes (Signed)
Pt here today for questionable UTI.  Pt reports that she feels like she never empties her bladder and has some burning with urination.  Urinalysis reported negative.  Notified Dr. Harolyn Rutherford who recommend that we send pt's urine for culture and we can prescribe Bactrim po bid for three days to help relieve sx's.  Notified pt provider's recommendation.  Pt verbalized understanding.   Mel Almond, RN 01/15/19

## 2019-01-16 LAB — URINE CULTURE: Organism ID, Bacteria: NO GROWTH

## 2019-01-18 NOTE — Progress Notes (Signed)
I have reviewed the chart and agree with nursing staff's documentation of this patient's encounter.  Verita Schneiders, MD 01/18/2019 9:18 AM

## 2019-02-09 ENCOUNTER — Other Ambulatory Visit: Payer: Self-pay | Admitting: *Deleted

## 2019-02-09 ENCOUNTER — Other Ambulatory Visit: Payer: Self-pay

## 2019-02-09 ENCOUNTER — Other Ambulatory Visit: Payer: Self-pay | Admitting: Medical

## 2019-02-09 DIAGNOSIS — Z20822 Contact with and (suspected) exposure to covid-19: Secondary | ICD-10-CM

## 2019-02-10 ENCOUNTER — Other Ambulatory Visit: Payer: Self-pay | Admitting: Medical

## 2019-02-10 ENCOUNTER — Telehealth: Payer: Self-pay | Admitting: Medical

## 2019-02-10 MED ORDER — NORETHIN ACE-ETH ESTRAD-FE 1-20 MG-MCG PO TABS: 1.0000 | ORAL_TABLET | Freq: Every day | ORAL | 0 refills | Status: DC

## 2019-02-10 NOTE — Telephone Encounter (Signed)
I sent refill, but I also sent Dr. Maylene Roes -Tamala Julian (gynecology) a note.   We referred her to gyn in May due to abnormal pap and amenorrhea.   Not sure if gynecology was going to change her birth control, but make sure Lexi checks in with Dr. Ihor Dow.

## 2019-02-10 NOTE — Telephone Encounter (Signed)
Pt called and needs refill on Junel [facility] [address]. Pt stated that she is out of this medication

## 2019-02-10 NOTE — Telephone Encounter (Signed)
Is this ok to refill?  

## 2019-02-11 ENCOUNTER — Other Ambulatory Visit: Payer: Self-pay

## 2019-02-11 ENCOUNTER — Telehealth (INDEPENDENT_AMBULATORY_CARE_PROVIDER_SITE_OTHER): Payer: Medicaid Other | Admitting: Obstetrics & Gynecology

## 2019-02-11 ENCOUNTER — Encounter: Payer: Self-pay | Admitting: Obstetrics & Gynecology

## 2019-02-11 DIAGNOSIS — Z793 Long term (current) use of hormonal contraceptives: Secondary | ICD-10-CM | POA: Diagnosis not present

## 2019-02-11 DIAGNOSIS — N912 Amenorrhea, unspecified: Secondary | ICD-10-CM

## 2019-02-11 MED ORDER — NORETHINDRONE ACET-ETHINYL EST 1.5-30 MG-MCG PO TABS: 1.0000 | ORAL_TABLET | Freq: Every day | ORAL | 11 refills | Status: DC

## 2019-02-11 NOTE — Progress Notes (Signed)
    TELEHEALTH GYNECOLOGY VIRTUAL VIDEO VISIT ENCOUNTER NOTE  Provider location: [facility] at [facility]   I connected with Shelly Koch on 02/11/19 at  8:35 AM EDT by MyChart Video Encounter at home and verified that I am speaking with the correct person using two identifiers.   I discussed the limitations, risks, security and privacy concerns of performing an evaluation and management service virtually and the availability of in person appointments. I also discussed with the patient that there may be a patient responsible charge related to this service. The patient expressed understanding and agreed to proceed.   History:  Shelly Koch is a 28 y.o. [id number] female being evaluated today for amenorrhea.  She is still taking the OCPs.  She denies any abnormal vaginal discharge, bleeding, pelvic pain or other concerns.       Past Medical History:  Diagnosis Date  . Anemia   . Gestational trophoblastic neoplasm   . PICC (peripherally inserted central catheter) in place 12/24/2014  . Preterm labor   . Pyelonephritis    Past Surgical History:  Procedure Laterality Date  . DILATION AND EVACUATION N/A 08/10/2014   Procedure: DILATATION AND EVACUATION;  Surgeon: Lavonia Drafts, MD;  Location: [place] [facility];  Service: Gynecology;  Laterality: N/A;  . DILATION AND EVACUATION N/A 10/08/2017   Procedure: DILATATION AND EVACUATION;  Surgeon: Mora Bellman, MD;  Location: [place] [facility];  Service: Gynecology;  Laterality: N/A;  . HYSTEROSCOPY N/A 08/10/2014   Procedure: HYSTEROSCOPY;  Surgeon: Lavonia Drafts, MD;  Location: [place] [facility];  Service: Gynecology;  Laterality: N/A;   The following portions of the patient's history were reviewed and updated as appropriate: allergies, current medications, past family history, past medical history, past social history, past surgical history and problem list.   Health Maintenance:  Normal pap and posotive HRHPV on 11/24/2018.   Review of  Systems:  Pertinent items noted in HPI and remainder of comprehensive ROS otherwise negative.  Physical Exam:   General:  Alert, oriented and cooperative. Patient appears to be in no acute distress.  Mental Status: Normal mood and affect. Normal behavior. Normal judgment and thought content.   Respiratory: Normal respiratory effort, no problems with respiration noted  Rest of physical exam deferred due to type of encounter    Assessment and Plan:     Amenorrhea due to OCPs  Prev labs workup wnl  Will change to LoEstrin 1/30.   F/u in 3 months         I discussed the assessment and treatment plan with the patient. The patient was provided an opportunity to ask questions and all were answered. The patient agreed with the plan and demonstrated an understanding of the instructions.   The patient was advised to call back or seek an in-person evaluation/go to the ED if the symptoms worsen or if the condition fails to improve as anticipated.  I provided 12 minutes of face-to-face time during this encounter.   Lavonia Drafts, MD [facility], [place]

## 2019-02-12 LAB — NOVEL CORONAVIRUS, NAA: SARS-CoV-2, NAA: NOT DETECTED

## 2019-02-15 ENCOUNTER — Telehealth: Payer: Self-pay | Admitting: Family Medicine

## 2019-02-15 ENCOUNTER — Encounter: Payer: Self-pay | Admitting: Family Medicine

## 2019-02-15 NOTE — Telephone Encounter (Signed)
Pt called for covid results and it is negative, pt needs letter to return to work.  No symptoms

## 2019-03-02 ENCOUNTER — Other Ambulatory Visit: Payer: Self-pay | Admitting: Medical

## 2019-03-30 ENCOUNTER — Other Ambulatory Visit: Payer: Self-pay | Admitting: Medical

## 2019-04-26 ENCOUNTER — Other Ambulatory Visit: Payer: Self-pay | Admitting: Medical

## 2019-04-26 NOTE — Telephone Encounter (Signed)
[facility] is requesting to [facility] Junel FE please advise [facility]

## 2019-05-14 ENCOUNTER — Encounter: Payer: Self-pay | Admitting: Obstetrics & Gynecology

## 2019-05-14 ENCOUNTER — Other Ambulatory Visit: Payer: Self-pay

## 2019-05-14 ENCOUNTER — Telehealth (INDEPENDENT_AMBULATORY_CARE_PROVIDER_SITE_OTHER): Payer: Medicaid Other | Admitting: Obstetrics & Gynecology

## 2019-05-14 DIAGNOSIS — N911 Secondary amenorrhea: Secondary | ICD-10-CM | POA: Diagnosis not present

## 2019-05-14 MED ORDER — MEDROXYPROGESTERONE ACETATE 10 MG PO TABS: 10.0000 mg | ORAL_TABLET | Freq: Every day | ORAL | 0 refills | Status: DC

## 2019-05-14 MED FILL — MEDROXYPROGESTERONE 10 MG T: 10 | 10 days supply | Qty: 10 | Fill #0

## 2019-05-14 NOTE — Progress Notes (Signed)
    TELEHEALTH GYNECOLOGY VIRTUAL VIDEO VISIT ENCOUNTER NOTE  Provider location: [facility] at [place]   I connected with Shelly Koch on 05/14/19 at  9:35 AM EDT by MyChart Video Encounter at home and verified that I am speaking with the correct person using two identifiers.   I discussed the limitations, risks, security and privacy concerns of performing an evaluation and management service virtually and the availability of in person appointments. I also discussed with the patient that there may be a patient responsible charge related to this service. The patient expressed understanding and agreed to proceed.   History:  Shelly Koch is a 28 y.o. [id number] female being evaluated today for amenorrhea. She denies any abnormal vaginal discharge, pelvic pain or other concerns.       Past Medical History:  Diagnosis Date  . Anemia   . Gestational trophoblastic neoplasm   . PICC (peripherally inserted central catheter) in place 12/24/2014  . Preterm labor   . Pyelonephritis    Past Surgical History:  Procedure Laterality Date  . DILATION AND EVACUATION N/A 08/10/2014   Procedure: DILATATION AND EVACUATION;  Surgeon: Lavonia Drafts, MD;  Location: [facility];  Service: Gynecology;  Laterality: N/A;  . DILATION AND EVACUATION N/A 10/08/2017   Procedure: DILATATION AND EVACUATION;  Surgeon: Mora Bellman, MD;  Location: [facility];  Service: Gynecology;  Laterality: N/A;  . HYSTEROSCOPY N/A 08/10/2014   Procedure: HYSTEROSCOPY;  Surgeon: Lavonia Drafts, MD;  Location: [facility];  Service: Gynecology;  Laterality: N/A;   The following portions of the patient's history were reviewed and updated as appropriate: allergies, current medications, past family history, past medical history, past social history, past surgical history and problem list.   Health Maintenance:  ASCUS neg hrHPV    Normal mammogram on n/a.   Review of Systems:  Pertinent items noted in HPI and  remainder of comprehensive ROS otherwise negative.  Physical Exam:   General:  Alert, oriented and cooperative. Patient appears to be in no acute distress.  Mental Status: Normal mood and affect. Normal behavior. Normal judgment and thought content.   Respiratory: Normal respiratory effort, no problems with respiration noted  Rest of physical exam deferred due to type of encounter    Assessment and Plan:     Amenorrhea- secondary  Provera challenge 10mg  daily x 10  Pt to call 2 week after the challenge.       I discussed the assessment and treatment plan with the patient. The patient was provided an opportunity to ask questions and all were answered. The patient agreed with the plan and demonstrated an understanding of the instructions.   The patient was advised to call back or seek an in-person evaluation/go to the ED if the symptoms worsen or if the condition fails to improve as anticipated.  I provided 12 minutes of face-to-face time during this encounter.   Lavonia Drafts, MD [facility], [place]

## 2019-05-14 NOTE — Progress Notes (Signed)
I connected with  Shelly Koch on 05/14/19 at  9:35 AM EDT by telephone and verified that I am speaking with the correct person using two identifiers.   I discussed the limitations, risks, security and privacy concerns of performing an evaluation and management service by telephone and the availability of in person appointments. I also discussed with the patient that there may be a patient responsible charge related to this service. The patient expressed understanding and agreed to proceed.  [place], [place] 05/14/2019  9:29 AM

## 2019-07-13 ENCOUNTER — Telehealth: Payer: Self-pay | Admitting: Obstetrics & Gynecology

## 2019-07-13 NOTE — Telephone Encounter (Signed)
Called patient and she states she had a doctor's visit back in October and was given Provera. Patient states she finished the medication on 11/16 and she still hasn't got a period. Told patient I would notify the doctor and we will be back in touch with her when we hear something back. Patient verbalized understanding & had no other questions at this time.

## 2019-07-13 NOTE — Telephone Encounter (Signed)
Patient is requesting a call back.

## 2019-07-14 ENCOUNTER — Telehealth: Payer: Self-pay | Admitting: Obstetrics & Gynecology

## 2019-07-14 DIAGNOSIS — N912 Amenorrhea, unspecified: Secondary | ICD-10-CM

## 2019-07-14 MED ORDER — NORGESTIMATE-ETH ESTRADIOL 0.25-35 MG-MCG PO TABS: 1.0000 | ORAL_TABLET | Freq: Every day | ORAL | 2 refills | Status: DC

## 2019-07-14 NOTE — Telephone Encounter (Signed)
Tc to pt. Rec 1 month of combined OCP to see if replacing the EES helps with the pts sx. Left message for pt to call at the end of the 4 weeks.   May need a hysteroscopy to eval.   Jestine Bicknell L. Harraway-Smith, M.D., [place]lynn June

## 2019-09-05 ENCOUNTER — Encounter (HOSPITAL_COMMUNITY): Payer: Self-pay | Admitting: Emergency Medicine

## 2019-09-05 ENCOUNTER — Other Ambulatory Visit: Payer: Self-pay

## 2019-09-05 ENCOUNTER — Ambulatory Visit (HOSPITAL_COMMUNITY)
Admission: EM | Admit: 2019-09-05 | Discharge: 2019-09-05 | Disposition: A | Discharge status: Home / Self Care | Payer: Medicaid Other | Attending: Urgent Care | Admitting: Urgent Care

## 2019-09-05 DIAGNOSIS — Z20822 Contact with and (suspected) exposure to covid-19: Secondary | ICD-10-CM

## 2019-09-05 DIAGNOSIS — K644 Residual hemorrhoidal skin tags: Secondary | ICD-10-CM | POA: Insufficient documentation

## 2019-09-05 DIAGNOSIS — K6289 Other specified diseases of anus and rectum: Secondary | ICD-10-CM

## 2019-09-05 MED ORDER — LIDOCAINE (ANORECTAL) 5 % EX GEL: 1.0000 "application " | Freq: Two times a day (BID) | CUTANEOUS | 0 refills | Status: DC | PRN

## 2019-09-05 MED ORDER — HYDROCORTISONE ACETATE 25 MG RE SUPP: 25.0000 mg | Freq: Two times a day (BID) | RECTAL | 0 refills | Status: DC

## 2019-09-05 NOTE — ED Triage Notes (Signed)
At bedside for [place], [place] visual examination of rectum

## 2019-09-05 NOTE — ED Triage Notes (Signed)
Patient has blood in stool for 3 days.  The worst day was 2 days ago.  Stool is not hard.  Reports it is not diarrhea.  Patient has rectal pain and abdominal pain.   Patient has a headache today

## 2019-09-05 NOTE — ED Provider Notes (Signed)
[facility]   MRN: [id number] DOB: [birth year]  Subjective:   Shelly Koch is a 29 y.o. female presenting for 2-day history of perianal pain, bright red blood per rectum and bloody stool. Patient states that she does not have a history of hemorrhoids, does not have difficulty with hard stools.  Denies fever, nausea, vomiting, belly pain, dysuria, urinary frequency, hematuria.  Patient does not do a lot of heavy lifting apart from her 69-month-old.  No current facility-administered medications for this encounter.  Current Outpatient Medications:  .  norgestimate-ethinyl estradiol (ORTHO-CYCLEN) 0.25-35 MG-MCG tablet, Take 1 tablet by mouth daily., Disp: 1 Package, Rfl: 2 .  acetaminophen (TYLENOL) 500 MG tablet, Take 500 mg by mouth every 6 (six) hours as needed for mild pain., Disp: , Rfl:  .  butalbital-acetaminophen-caffeine (FIORICET) 50-325-40 MG tablet, Take 1 tablet by mouth every 6 (six) hours as needed for headache., Disp: 20 tablet, Rfl: 0 .  JUNEL FE 1/20 1-20 MG-MCG tablet, TAKE 1 TABLET BY MOUTH EVERY DAY, Disp: 28 tablet, Rfl: 1 .  medroxyPROGESTERone (PROVERA) 10 MG tablet, Take 1 tablet (10 mg total) by mouth daily. Use for ten days, Disp: 10 tablet, Rfl: 0 .  Norethindrone Acetate-Ethinyl Estradiol (LOESTRIN 1.5/30, 21,) 1.5-30 MG-MCG tablet, Take 1 tablet by mouth daily., Disp: 1 Package, Rfl: 11 .  sulfamethoxazole-trimethoprim (BACTRIM DS) 800-160 MG tablet, Take 1 tablet by mouth 2 (two) times daily. (Patient not taking: Reported on 02/11/2019), Disp: 6 tablet, Rfl: 0   Allergies  Allergen Reactions  . Okra     Hives and itching  . Ondansetron Hives and Itching  . Phenergan [Promethazine] Hives and Itching    Note: pt tolerates PO COMPAZINE  . Zofran [Ondansetron Hcl] Rash    Past Medical History:  Diagnosis Date  . Anemia   . Gestational trophoblastic neoplasm   . PICC (peripherally inserted central catheter) in place 12/24/2014  . Preterm labor   .  Pyelonephritis      Past Surgical History:  Procedure Laterality Date  . DILATION AND EVACUATION N/A 08/10/2014   Procedure: DILATATION AND EVACUATION;  Surgeon: Lavonia Drafts, MD;  Location: [facility];  Service: Gynecology;  Laterality: N/A;  . DILATION AND EVACUATION N/A 10/08/2017   Procedure: DILATATION AND EVACUATION;  Surgeon: Mora Bellman, MD;  Location: [facility];  Service: Gynecology;  Laterality: N/A;  . HYSTEROSCOPY N/A 08/10/2014   Procedure: HYSTEROSCOPY;  Surgeon: Lavonia Drafts, MD;  Location: [facility];  Service: Gynecology;  Laterality: N/A;    Family History  Problem Relation Age of Onset  . Alcohol abuse Neg Hx     Social History   Tobacco Use  . Smoking status: Never Smoker  . Smokeless tobacco: Never Used  Substance Use Topics  . Alcohol use: No  . Drug use: No    ROS   Objective:   Vitals: BP 117/84 (BP Location: Right Arm)   Pulse (!) 103   Temp 98.2 F (36.8 C) (Oral)   Resp 16   SpO2 98%   Physical Exam Chaperone present: RN Hutchinson.  Constitutional:      General: She is not in acute distress.    Appearance: Normal appearance. She is well-developed. She is not ill-appearing, toxic-appearing or diaphoretic.  HENT:     Head: Normocephalic and atraumatic.     Nose: Nose normal.     Mouth/Throat:     Mouth: Mucous membranes are moist.     Pharynx: Oropharynx is clear.  Eyes:  General: No scleral icterus.    Extraocular Movements: Extraocular movements intact.     Pupils: Pupils are equal, round, and reactive to light.  Cardiovascular:     Rate and Rhythm: Normal rate.  Pulmonary:     Effort: Pulmonary effort is normal.  Genitourinary:   Skin:    General: Skin is warm and dry.  Neurological:     General: No focal deficit present.     Mental Status: She is alert and oriented to person, place, and time.  Psychiatric:        Mood and Affect: Mood normal.        Behavior: Behavior normal.      Assessment and Plan  :   1. External hemorrhoids   2. Anal pain   3. Exposure to COVID-19 virus     Start hydrocortisone suppositories.  Recommended eating high-fiber diet and hydrating well.  Also advised that she limit her lifting as this may be a source of her symptoms.  Use lidocaine gel for local relief.  At the end of our visit patient requested a COVID-19 test for screening purposes. Counseled patient on potential for adverse effects with medications prescribed/recommended today, ER and return-to-clinic precautions discussed, patient verbalized understanding.    Jaynee Eagles, [place] 09/05/19 1830

## 2019-09-06 LAB — NOVEL CORONAVIRUS, NAA (HOSP ORDER, SEND-OUT TO REF LAB; TAT 18-24 HRS): SARS-CoV-2, NAA: NOT DETECTED

## 2019-10-05 ENCOUNTER — Other Ambulatory Visit: Payer: Self-pay

## 2019-10-05 DIAGNOSIS — N912 Amenorrhea, unspecified: Secondary | ICD-10-CM

## 2019-10-05 MED ORDER — NORGESTIMATE-ETH ESTRADIOL 0.25-35 MG-MCG PO TABS: 1.0000 | ORAL_TABLET | Freq: Every day | ORAL | 2 refills | Status: DC

## 2019-10-15 ENCOUNTER — Telehealth: Payer: Self-pay

## 2019-10-15 NOTE — Telephone Encounter (Signed)
Pt called the office stating she received a Mychart message stating she needs to come into the office for lab work before her appointment. Pt made aware that I will discuss which labs are needed with the provider and give her a call next week. Understanding was voiced. Jemiah Ellenburg l Chavon Lucarelli, CMA

## 2019-11-01 ENCOUNTER — Ambulatory Visit: Payer: Medicaid Other | Admitting: Medical

## 2019-11-01 ENCOUNTER — Other Ambulatory Visit: Payer: Self-pay

## 2019-11-01 ENCOUNTER — Encounter: Payer: Self-pay | Admitting: Medical

## 2019-11-01 VITALS — BP 100/70 | HR 95 | Temp 98.9°F | Ht <= 58 in | Wt 101.6 lb

## 2019-11-01 DIAGNOSIS — H1013 Acute atopic conjunctivitis, bilateral: Secondary | ICD-10-CM

## 2019-11-01 DIAGNOSIS — G43109 Migraine with aura, not intractable, without status migrainosus: Secondary | ICD-10-CM | POA: Diagnosis not present

## 2019-11-01 DIAGNOSIS — Z711 Person with feared health complaint in whom no diagnosis is made: Secondary | ICD-10-CM | POA: Insufficient documentation

## 2019-11-01 MED ORDER — BUTALBITAL-APAP-CAFFEINE 50-325-40 MG PO TABS: 1.0000 | ORAL_TABLET | Freq: Four times a day (QID) | ORAL | 1 refills | Status: DC | PRN

## 2019-11-01 MED ORDER — OLOPATADINE HCL 0.1 % OP SOLN: 1.0000 [drp] | Freq: Two times a day (BID) | OPHTHALMIC | 1 refills | Status: DC

## 2019-11-01 NOTE — Patient Instructions (Signed)
Eye concern  Begin trial of medication over the counter Pataday eye drops daily for the next 1-2 weeks.  If this helps then great.   If the Pataday doesn't help, consider seeing eye doctor  Try to start using your eye glasses regularly  Avoid bright light  If you feel DRY in the eyes, you could use an over the counter drop called TheraTears   Ocular Migraines/migraines  continue Fioricet/Butalbital for migraine headache  If you are getting more than 2 migraine bad headaches per month, then we should consider other evaluation and treatment  Triggers for migraines can be stress, too much caffeine, skipping meals, not sleeping well, strong odors and scents for example  You may be getting some migraines from not wearing your glasses  If you start to get worse migraines, we may need to repeat head scan or have you see neurologist/headache specialist

## 2019-11-01 NOTE — Progress Notes (Signed)
Subjective:  Shelly Koch is a 29 y.o. female who presents for Chief Complaint  Patient presents with  . Eye Problem    left      Here for eye concerns, headache.   Having some itchy eyes, watery eyes, irritated eyes, seems blurry clones with the symptoms.  Thinks the allergies have flared up her as is her kids have allergy problems right now as well.  She also notes history of migraines, has had these for years.  When she gets a bad migraine she can have acute visual loss that returns, photophobia, getting maybe 2 bad migraines a month.  Fioricet helps but she is out of this.  She had a scan in 2016 of her head that was normal.  She is not sure what triggers her migraines.  She is supposed be wearing glasses from her eye doctor visit 5 to 6 months ago but her child knocks them off often so she has not been wearing them as frequently.  No other aggravating or relieving factors.    No other c/o.  The following portions of the patient's history were reviewed and updated as appropriate: allergies, current medications, past family history, past medical history, past social history, past surgical history and problem list.  ROS Otherwise as in subjective above    Objective: BP 100/70   Pulse 95   Temp 98.9 F (37.2 C)   Ht 4\' 10"  (1.473 m)   Wt 101 lb 9.6 oz (46.1 kg)   SpO2 97%   BMI 21.23 kg/m   General appearance: alert, no distress, well developed, well nourished HEENT: normocephalic, sclerae anicteric, conjunctiva pink and moist, TMs pearly, nares patent, no discharge or erythema, pharynx normal Oral cavity: MMM, no lesions Neck: supple, no lymphadenopathy, no thyromegaly, no masses  near vision test 20/20 bilat     Assessment: Encounter Diagnoses  Name Primary?  . Ocular migraine Yes  . Allergic conjunctivitis of both eyes   . Concern about eye disease without diagnosis      Plan: We discussed her symptoms and concerns.  We discussed possible differential for  each issue.  Begin recommendations as below  Patient Instructions  Eye concern  Begin trial of medication over the counter Pataday eye drops daily for the next 1-2 weeks.  If this helps then great.   If the Pataday doesn't help, consider seeing eye doctor  Try to start using your eye glasses regularly  Avoid bright light  If you feel DRY in the eyes, you could use an over the counter drop called TheraTears   Ocular Migraines/migraines  continue Fioricet/Butalbital for migraine headache  If you are getting more than 2 migraine bad headaches per month, then we should consider other evaluation and treatment  Triggers for migraines can be stress, too much caffeine, skipping meals, not sleeping well, strong odors and scents for example  You may be getting some migraines from not wearing your glasses  If you start to get worse migraines, we may need to repeat head scan or have you see neurologist/headache specialist         Shelly Koch was seen today for eye problem.  Diagnoses and all orders for this visit:  Ocular migraine  Allergic conjunctivitis of both eyes  Concern about eye disease without diagnosis  Other orders -     butalbital-acetaminophen-caffeine (FIORICET) 50-325-40 MG tablet; Take 1 tablet by mouth every 6 (six) hours as needed for headache. -     olopatadine (PATADAY) 0.1 % ophthalmic  solution; Place 1 drop into both eyes 2 (two) times daily.    Follow up: with my chart message in 1-2 weeks

## 2019-11-12 ENCOUNTER — Other Ambulatory Visit: Payer: Self-pay

## 2019-11-12 ENCOUNTER — Other Ambulatory Visit: Payer: Medicaid Other

## 2019-11-12 DIAGNOSIS — N911 Secondary amenorrhea: Secondary | ICD-10-CM

## 2019-11-13 LAB — TSH: TSH: 1.75 u[IU]/mL (ref 0.450–4.500)

## 2019-11-13 LAB — PROLACTIN: Prolactin: 9 ng/mL (ref 4.8–23.3)

## 2019-11-13 LAB — FOLLICLE STIMULATING HORMONE: FSH: 6 m[IU]/mL

## 2019-11-29 ENCOUNTER — Encounter: Payer: Self-pay | Admitting: Obstetrics and Gynecology

## 2019-11-29 ENCOUNTER — Other Ambulatory Visit: Payer: Self-pay

## 2019-11-29 ENCOUNTER — Ambulatory Visit (INDEPENDENT_AMBULATORY_CARE_PROVIDER_SITE_OTHER): Payer: Medicaid Other | Admitting: Obstetrics and Gynecology

## 2019-11-29 VITALS — BP 119/85 | HR 73 | Ht 59.0 in | Wt 102.0 lb

## 2019-11-29 DIAGNOSIS — N979 Female infertility, unspecified: Secondary | ICD-10-CM | POA: Diagnosis not present

## 2019-11-29 DIAGNOSIS — N911 Secondary amenorrhea: Secondary | ICD-10-CM | POA: Diagnosis not present

## 2019-11-29 MED ORDER — NORETHINDRONE ACET-ETHINYL EST 1.5-30 MG-MCG PO TABS: 1.0000 | ORAL_TABLET | Freq: Every day | ORAL | 11 refills | Status: DC

## 2019-11-29 NOTE — Progress Notes (Signed)
   GYNECOLOGY OFFICE FOLLOW UP NOTE  History:  29 y.o. [id number] here today for follow up for secondary amenorrhea. Took 3 months of birth control pills last summer, no period. Did provera challenge in 04/2019 with no withdrawal bleed. Was recommended to take 1 month OCPs in 06/2019 but did not take those.   Patient is desiring pregnancy. H/o D&C 3 weeks post partum for retained products after last delivery (09/2017).  Past Medical History:  Diagnosis Date  . Anemia   . Gestational trophoblastic neoplasm   . PICC (peripherally inserted central catheter) in place 12/24/2014  . Preterm labor   . Pyelonephritis     Past Surgical History:  Procedure Laterality Date  . DILATION AND EVACUATION N/A 08/10/2014   Procedure: DILATATION AND EVACUATION;  Surgeon: Lavonia Drafts, MD;  Location: [facility];  Service: Gynecology;  Laterality: N/A;  . DILATION AND EVACUATION N/A 10/08/2017   Procedure: DILATATION AND EVACUATION;  Surgeon: Mora Bellman, MD;  Location: [facility];  Service: Gynecology;  Laterality: N/A;  . HYSTEROSCOPY N/A 08/10/2014   Procedure: HYSTEROSCOPY;  Surgeon: Lavonia Drafts, MD;  Location: [facility];  Service: Gynecology;  Laterality: N/A;     Current Outpatient Medications:  .  Norethindrone Acetate-Ethinyl Estradiol (LOESTRIN 1.5/30, 21,) 1.5-30 MG-MCG tablet, Take 1 tablet by mouth daily., Disp: 1 Package, Rfl: 11  The following portions of the patient's history were reviewed and updated as appropriate: allergies, current medications, past family history, past medical history, past social history, past surgical history and problem list.   Review of Systems:  Pertinent items noted in HPI and remainder of comprehensive ROS otherwise negative.   Objective:  Physical Exam BP 119/85   Pulse 73   Ht 4\' 11"  (1.499 m)   Wt 102 lb (46.3 kg)   BMI 20.60 kg/m  CONSTITUTIONAL: Well-developed, well-nourished female in no acute distress.  HENT:  Normocephalic, atraumatic.  External right and left ear normal. Oropharynx is clear and moist EYES: Conjunctivae and EOM are normal. Pupils are equal, round, and reactive to light. No scleral icterus.  NECK: Normal range of motion, supple, no masses SKIN: Skin is warm and dry. No rash noted. Not diaphoretic. No erythema. No pallor. NEUROLOGIC: Alert and oriented to person, place, and time. Normal reflexes, muscle tone coordination. No cranial nerve deficit noted. PSYCHIATRIC: Normal mood and affect. Normal behavior. Normal judgment and thought content. CARDIOVASCULAR: Normal heart rate noted RESPIRATORY: Effort normal, no problems with respiration noted ABDOMEN: Soft, no distention noted.   PELVIC: deferred MUSCULOSKELETAL: Normal range of motion. No edema noted.  Labs and Imaging No results found.  Assessment & Plan:   1. Secondary amenorrhea - s/p post partum d&C for retained products 2019 - TSH, prolactin, FSH wnl - will try OCPs x 2 months, had unsuccessful provera challenge - will check Estradiol, testosterone - if no successful bleed, plan for hysteroscopy - pt desiring pregnancy and return of her period  2. Female fertility problem Desiring fertility   Routine preventative health maintenance measures emphasized. Please refer to After Visit Summary for other counseling recommendations.   Return in about 2 months (around 01/29/2020) for in person, Followup.  Total face-to-face time with patient: 15 minutes. Over 50% of encounter was spent on counseling and coordination of care.  Feliz Beam, M.D. Attending [facility] [employer])

## 2019-12-01 LAB — TESTOSTERONE,FREE AND TOTAL
Testosterone, Free: 4.3 pg/mL — ABNORMAL HIGH (ref 0.0–4.2)
Testosterone: 36 ng/dL (ref 13–71)

## 2019-12-01 LAB — ESTRADIOL: Estradiol: 24.3 pg/mL

## 2019-12-04 DIAGNOSIS — Z23 Encounter for immunization: Secondary | ICD-10-CM | POA: Diagnosis not present

## 2019-12-25 DIAGNOSIS — Z23 Encounter for immunization: Secondary | ICD-10-CM | POA: Diagnosis not present

## 2020-01-30 ENCOUNTER — Encounter (HOSPITAL_COMMUNITY): Payer: Self-pay

## 2020-01-30 ENCOUNTER — Other Ambulatory Visit: Payer: Self-pay

## 2020-01-30 ENCOUNTER — Ambulatory Visit (HOSPITAL_COMMUNITY)
Admission: EM | Admit: 2020-01-30 | Discharge: 2020-01-30 | Disposition: A | Discharge status: Home / Self Care | Payer: Medicaid Other | Attending: Family Medicine | Admitting: Family Medicine

## 2020-01-30 DIAGNOSIS — H1032 Unspecified acute conjunctivitis, left eye: Secondary | ICD-10-CM

## 2020-01-30 DIAGNOSIS — R519 Headache, unspecified: Secondary | ICD-10-CM

## 2020-01-30 DIAGNOSIS — H5789 Other specified disorders of eye and adnexa: Secondary | ICD-10-CM | POA: Diagnosis not present

## 2020-01-30 MED ORDER — POLYMYXIN B-TRIMETHOPRIM 10000-0.1 UNIT/ML-% OP SOLN
1.0000 [drp] | OPHTHALMIC | 0 refills | Status: DC
Start: 2020-01-30 — End: 2020-05-17

## 2020-01-30 NOTE — ED Provider Notes (Signed)
[facility]   [id number] 01/30/20 Arrival Time: 4270  CC: EYE REDNESS  SUBJECTIVE:  Shelly Koch is a 29 y.o. female who presents with complaint of eye redness that began about 3 days ago.  Does see an eye doctor regularly.  Reports that she wears contacts at times.  Denies a precipitating event, trauma, or close contacts with similar symptoms. Has not tried OTC medications for this.  Does report some light sensitivity. denies similar symptoms in the past. Denies fever, chills, nausea, vomiting, eye pain, painful eye movements, halos, discharge, itching, vision changes, double vision, FB sensation, periorbital erythema.     Denies contact lens use.    ROS: As per HPI.  All other pertinent ROS negative.     Past Medical History:  Diagnosis Date  . Anemia   . Gestational trophoblastic neoplasm   . PICC (peripherally inserted central catheter) in place 12/24/2014  . Preterm labor   . Pyelonephritis    Past Surgical History:  Procedure Laterality Date  . DILATION AND EVACUATION N/A 08/10/2014   Procedure: DILATATION AND EVACUATION;  Surgeon: Lavonia Drafts, MD;  Location: [facility];  Service: Gynecology;  Laterality: N/A;  . DILATION AND EVACUATION N/A 10/08/2017   Procedure: DILATATION AND EVACUATION;  Surgeon: Mora Bellman, MD;  Location: [facility];  Service: Gynecology;  Laterality: N/A;  . HYSTEROSCOPY N/A 08/10/2014   Procedure: HYSTEROSCOPY;  Surgeon: Lavonia Drafts, MD;  Location: [facility];  Service: Gynecology;  Laterality: N/A;   Allergies  Allergen Reactions  . Okra     Hives and itching  . Ondansetron Hives and Itching  . Phenergan [Promethazine] Hives and Itching    Note: pt tolerates PO COMPAZINE  . Zofran [Ondansetron Hcl] Rash   No current facility-administered medications on file prior to encounter.   Current Outpatient Medications on File Prior to Encounter  Medication Sig Dispense Refill  . Norethindrone Acetate-Ethinyl Estradiol (LOESTRIN  1.5/30, 21,) 1.5-30 MG-MCG tablet Take 1 tablet by mouth daily. 1 Package 11   Social History   Socioeconomic History  . Marital status: Single    Spouse name: Not on file  . Number of children: Not on file  . Years of education: Not on file  . Highest education level: Not on file  Occupational History  . Not on file  Tobacco Use  . Smoking status: Never Smoker  . Smokeless tobacco: Never Used  Vaping Use  . Vaping Use: Never used  Substance and Sexual Activity  . Alcohol use: No  . Drug use: No  . Sexual activity: Yes    Partners: Male    Birth control/protection: Pill  Other Topics Concern  . Not on file  Social History Narrative  . Not on file   Social Determinants of Health   Financial Resource Strain:   . Difficulty of Paying Living Expenses:   Food Insecurity: No Food Insecurity  . Worried About Charity fundraiser in the Last Year: Never true  . Ran Out of Food in the Last Year: Never true  Transportation Needs: No Transportation Needs  . Lack of Transportation (Medical): No  . Lack of Transportation (Non-Medical): No  Physical Activity:   . Days of Exercise per Week:   . Minutes of Exercise per Session:   Stress:   . Feeling of Stress :   Social Connections:   . Frequency of Communication with Friends and Family:   . Frequency of Social Gatherings with Friends and Family:   . Attends Religious  Services:   . Active Member of Clubs or Organizations:   . Attends Archivist Meetings:   Marland Kitchen Marital Status:   Intimate Partner Violence:   . Fear of Current or Ex-Partner:   . Emotionally Abused:   Marland Kitchen Physically Abused:   . Sexually Abused:    Family History  Problem Relation Age of Onset  . Alcohol abuse Neg Hx     OBJECTIVE:    Visual Acuity  Right Eye Distance:   Left Eye Distance:   Bilateral Distance:    Right Eye Near:   Left Eye Near:    Bilateral Near:      Vitals:   01/30/20 1506  BP: 114/86  Pulse: 78  Resp: 17  Temp: 98.4  F (36.9 C)  TempSrc: Oral  SpO2: 100%    General appearance: alert; no distress Eyes: Bilateral conjunctival erythema, worse to the left than the right. PERRL; EOMI without discomfort;  no obvious drainage; lid everted without obvious FB Neck: supple Lungs: clear to auscultation bilaterally Heart: regular rate and rhythm Skin: warm and dry Psychological: alert and cooperative; normal mood and affect   ASSESSMENT & PLAN:  1. Eye irritation   2. Nonintractable headache, unspecified chronicity pattern, unspecified headache type   3. Acute conjunctivitis of left eye, unspecified acute conjunctivitis type     Meds ordered this encounter  Medications  . trimethoprim-polymyxin b (POLYTRIM) ophthalmic solution    Sig: Place 1 drop into both eyes every 4 (four) hours.    Dispense:  10 mL    Refill:  0    Order Specific Question:   Supervising Provider    Answer:   Chase Picket [[id number]]     Conjunctivitis Use eye drops as prescribed and to completion Dispose of old contacts and wear glasses until you have finished course of antibiotic eye drops Wash pillow cases, wash hands regularly with soap and water, avoid touching your face and eyes, wash door handles, light switches, remotes and other objects you frequently touch Return or follow up with PCP if symptoms persists such as fever, chills, redness, swelling, eye pain, painful eye movements, vision changes   Reviewed expectations re: course of current medical issues. Questions answered. Outlined signs and symptoms indicating need for more acute intervention. Patient verbalized understanding. After Visit Summary given.    Faustino Congress, NP 01/30/20 1533

## 2020-01-30 NOTE — ED Triage Notes (Signed)
Pt presents with headache, bilateral eye sensitivity, and eye watering X 3 days.

## 2020-01-30 NOTE — Discharge Instructions (Addendum)
I have sent in antibiotic eyedrop for you to use in both eyes every 4 hours for the next 5 days  If you are not improving, follow-up with ophthalmology  Go to the ER, for sudden vision loss, other concerning symptoms

## 2020-02-10 ENCOUNTER — Other Ambulatory Visit: Payer: Self-pay

## 2020-02-10 ENCOUNTER — Ambulatory Visit: Payer: Medicaid Other | Admitting: Obstetrics and Gynecology

## 2020-02-22 ENCOUNTER — Other Ambulatory Visit: Payer: Self-pay

## 2020-02-22 ENCOUNTER — Encounter (HOSPITAL_BASED_OUTPATIENT_CLINIC_OR_DEPARTMENT_OTHER): Payer: Self-pay

## 2020-02-22 ENCOUNTER — Emergency Department (HOSPITAL_BASED_OUTPATIENT_CLINIC_OR_DEPARTMENT_OTHER)
Admission: EM | Admit: 2020-02-22 | Discharge: 2020-02-22 | Disposition: A | Discharge status: Home / Self Care | Payer: Medicaid Other | Attending: Emergency Medicine | Admitting: Emergency Medicine

## 2020-02-22 DIAGNOSIS — R1013 Epigastric pain: Secondary | ICD-10-CM | POA: Diagnosis not present

## 2020-02-22 DIAGNOSIS — R197 Diarrhea, unspecified: Secondary | ICD-10-CM | POA: Insufficient documentation

## 2020-02-22 DIAGNOSIS — R112 Nausea with vomiting, unspecified: Secondary | ICD-10-CM

## 2020-02-22 LAB — CBC
HCT: 44.4 % (ref 36.0–46.0)
Hemoglobin: 14.2 g/dL (ref 12.0–15.0)
MCH: 30.9 pg (ref 26.0–34.0)
MCHC: 32 g/dL (ref 30.0–36.0)
MCV: 96.7 fL (ref 80.0–100.0)
Platelets: 196 10*3/uL (ref 150–400)
RBC: 4.59 MIL/uL (ref 3.87–5.11)
RDW: 11.9 % (ref 11.5–15.5)
WBC: 6.9 10*3/uL (ref 4.0–10.5)
nRBC: 0 % (ref 0.0–0.2)

## 2020-02-22 LAB — COMPREHENSIVE METABOLIC PANEL
ALT: 20 U/L (ref 0–44)
AST: 23 U/L (ref 15–41)
Albumin: 4.7 g/dL (ref 3.5–5.0)
Alkaline Phosphatase: 53 U/L (ref 38–126)
Anion gap: 11 (ref 5–15)
BUN: 5 mg/dL — ABNORMAL LOW (ref 6–20)
CO2: 27 mmol/L (ref 22–32)
Calcium: 9.3 mg/dL (ref 8.9–10.3)
Chloride: 100 mmol/L (ref 98–111)
Creatinine, Ser: 0.53 mg/dL (ref 0.44–1.00)
GFR calc Af Amer: >60 mL/min (ref >60)
GFR calc non Af Amer: >60 mL/min (ref >60)
Glucose, Bld: 105 mg/dL — ABNORMAL HIGH (ref 70–99)
Potassium: 4 mmol/L (ref 3.5–5.1)
Sodium: 138 mmol/L (ref 135–145)
Total Bilirubin: 0.7 mg/dL (ref 0.3–1.2)
Total Protein: 8.8 g/dL — ABNORMAL HIGH (ref 6.5–8.1)

## 2020-02-22 LAB — URINALYSIS, ROUTINE W REFLEX MICROSCOPIC
Bilirubin Urine: NEGATIVE
Glucose, UA: NEGATIVE mg/dL
Hgb urine dipstick: NEGATIVE
Ketones, ur: NEGATIVE mg/dL
Leukocytes,Ua: NEGATIVE
Nitrite: NEGATIVE
Protein, ur: NEGATIVE mg/dL
Specific Gravity, Urine: 1.01 (ref 1.005–1.030)
pH: 5.5 (ref 5.0–8.0)

## 2020-02-22 LAB — PREGNANCY, URINE: Preg Test, Ur: NEGATIVE

## 2020-02-22 LAB — LIPASE, BLOOD: Lipase: 41 U/L (ref 11–51)

## 2020-02-22 MED ORDER — PROCHLORPERAZINE MALEATE 10 MG PO TABS
10.0000 mg | ORAL_TABLET | Freq: Two times a day (BID) | ORAL | 0 refills | Status: DC | PRN
Start: 2020-02-22 — End: 2020-05-17

## 2020-02-22 MED ORDER — PANTOPRAZOLE SODIUM 20 MG PO TBEC
20.0000 mg | DELAYED_RELEASE_TABLET | Freq: Every day | ORAL | 0 refills | Status: DC
Start: 2020-02-22 — End: 2020-05-29

## 2020-02-22 MED ORDER — SODIUM CHLORIDE 0.9% FLUSH
3.0000 mL | Freq: Once | INTRAVENOUS | Status: DC
  Filled 2020-02-22: qty 3

## 2020-02-22 MED ORDER — PROCHLORPERAZINE MALEATE 10 MG PO TABS
10.0000 mg | ORAL_TABLET | Freq: Once | ORAL | Status: AC
  Administered 2020-02-22: 10 mg via ORAL
  Filled 2020-02-22: qty 1

## 2020-02-22 NOTE — ED Provider Notes (Signed)
[facility] EMERGENCY DEPARTMENT Provider Note   CSN: [id number] Arrival date & time: 02/22/20  1921     History Chief Complaint  Patient presents with  . Abdominal Pain    Shelly Koch is a 29 y.o. female.  The history is provided by the patient.  Abdominal Pain Pain location:  Epigastric Pain quality: aching   Pain radiates to:  Does not radiate Pain severity:  Mild Onset quality:  Gradual Timing:  Intermittent Progression:  Waxing and waning Chronicity:  New Context: suspicious food intake   Relieved by:  Nothing Worsened by:  Nothing Associated symptoms: diarrhea, nausea and vomiting   Associated symptoms: no chest pain, no chills, no cough, no dysuria, no fever, no hematemesis, no hematuria, no melena, no shortness of breath and no sore throat   Risk factors: has not had multiple surgeries        Past Medical History:  Diagnosis Date  . Anemia   . Gestational trophoblastic neoplasm   . PICC (peripherally inserted central catheter) in place 12/24/2014  . Preterm labor   . Pyelonephritis     Patient Active Problem List   Diagnosis Date Noted  . Concern about eye disease without diagnosis 11/01/2019  . Pelvic pain 11/24/2018  . Nausea 11/24/2018  . Dysuria 11/23/2018  . Chronic bilateral low back pain without sciatica 11/23/2018  . Secondary amenorrhea 11/23/2018  . Screening for cervical cancer 11/23/2018  . Symptomatic anemia 10/08/2017  . Indication for care in labor or delivery 09/17/2017  . SVD (spontaneous vaginal delivery) 09/17/2017  . Uterine size date discrepancy 09/13/2017  . Poor weight gain of pregnancy, unspecified trimester 04/17/2017  . Iron deficiency anemia during pregnancy 04/17/2017  . Supervision of high risk pregnancy, antepartum 03/05/2017  . History of gestational hypertension 03/05/2017  . Lower abdominal pain 12/14/2014  . Encounter for contraceptive management 11/23/2014  . Environmental and seasonal allergies  10/18/2014  . 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 History of poor fetal growth 06/04/2014  . Elevated DSR     Past Surgical History:  Procedure Laterality Date  . DILATION AND EVACUATION N/A 08/10/2014   Procedure: DILATATION AND EVACUATION;  Surgeon: Lavonia Drafts, MD;  Location: [facility];  Service: Gynecology;  Laterality: N/A;  . DILATION AND EVACUATION N/A 10/08/2017   Procedure: DILATATION AND EVACUATION;  Surgeon: Mora Bellman, MD;  Location: [facility];  Service: Gynecology;  Laterality: N/A;  . HYSTEROSCOPY N/A 08/10/2014   Procedure: HYSTEROSCOPY;  Surgeon: Lavonia Drafts, MD;  Location: [facility];  Service: Gynecology;  Laterality: N/A;     OB History    Gravida  2   Para  2   Term  2   Preterm      AB      Living  2     SAB      TAB      Ectopic      Multiple  0   Live Births  2           Family History  Problem Relation Age of Onset  . Alcohol abuse Neg Hx     Social History   Tobacco Use  . Smoking status: Never Smoker  . Smokeless tobacco: Never Used  Vaping Use  . Vaping Use: Never used  Substance Use Topics  . Alcohol use: No  . Drug use: No    Home Medications Prior to Admission medications   Medication Sig Start Date End Date Taking? Authorizing Provider  Norethindrone Acetate-Ethinyl  Estradiol (LOESTRIN 1.5/30, 21,) 1.5-30 MG-MCG tablet Take 1 tablet by mouth daily. 11/29/19   Sloan Leiter, MD  pantoprazole (PROTONIX) 20 MG tablet Take 1 tablet (20 mg total) by mouth daily for 14 days. 02/22/20 03/07/20  Zaara Sprowl, DO  prochlorperazine (COMPAZINE) 10 MG tablet Take 1 tablet (10 mg total) by mouth 2 (two) times daily as needed for up to 10 doses for nausea or vomiting. 02/22/20   Wesly Whisenant, DO  trimethoprim-polymyxin b (POLYTRIM) ophthalmic solution Place 1 drop into both eyes every 4 (four) hours. 01/30/20   Faustino Congress, NP    Allergies    Okra, Ondansetron, Phenergan [promethazine], and  Zofran [ondansetron hcl]  Review of Systems   Review of Systems  Constitutional: Negative for chills and fever.  HENT: Negative for ear pain and sore throat.   Eyes: Negative for pain and visual disturbance.  Respiratory: Negative for cough and shortness of breath.   Cardiovascular: Negative for chest pain and palpitations.  Gastrointestinal: Positive for abdominal pain, diarrhea, nausea and vomiting. Negative for hematemesis and melena.  Genitourinary: Negative for dysuria and hematuria.  Musculoskeletal: Negative for arthralgias and back pain.  Skin: Negative for color change and rash.  Neurological: Negative for seizures and syncope.  All other systems reviewed and are negative.   Physical Exam Updated Vital Signs   Physical Exam Vitals and nursing note reviewed.  Constitutional:      General: She is not in acute distress.    Appearance: She is well-developed.  HENT:     Head: Normocephalic and atraumatic.     Mouth/Throat:     Mouth: Mucous membranes are moist.  Eyes:     Extraocular Movements: Extraocular movements intact.     Conjunctiva/sclera: Conjunctivae normal.     Pupils: Pupils are equal, round, and reactive to light.  Cardiovascular:     Rate and Rhythm: Normal rate and regular rhythm.     Heart sounds: Normal heart sounds. No murmur heard.   Pulmonary:     Effort: Pulmonary effort is normal. No respiratory distress.     Breath sounds: Normal breath sounds.  Abdominal:     Palpations: Abdomen is soft.     Tenderness: There is abdominal tenderness in the epigastric area.  Musculoskeletal:     Cervical back: Neck supple.  Skin:    General: Skin is warm and dry.  Neurological:     Mental Status: She is alert.     ED Results / Procedures / Treatments   Labs (all labs ordered are listed, but only abnormal results are displayed) Labs Reviewed  COMPREHENSIVE METABOLIC PANEL - Abnormal; Notable for the following components:      Result Value   Glucose,  Bld 105 (*)    BUN 5 (*)    Total Protein 8.8 (*)    All other components within normal limits  LIPASE, BLOOD  CBC  URINALYSIS, ROUTINE W REFLEX MICROSCOPIC  PREGNANCY, URINE    EKG None  Radiology No results found.  Procedures Procedures (including critical care time)  Medications Ordered in ED Medications  sodium chloride flush (NS) 0.9 % injection 3 mL (has no administration in time range)  prochlorperazine (COMPAZINE) tablet 10 mg (has no administration in time range)    ED Course  I have reviewed the triage vital signs and the nursing notes.  Pertinent labs & imaging results that were available during my care of the patient were reviewed by me and considered in my medical decision making (  see chart for details).    MDM Rules/Calculators/A&P                          Shelly Koch is a 29 year old female with no significant medical history presents the ED with nausea, vomiting, diarrhea, abdominal pain.  Symptoms started after eating suspicious food 3 days ago.  Symptoms have been intermittent.  Some epigastric discomfort but mostly when she throws up.  Lab work shows no significant anemia, electrolyte abnormality, kidney injury.  Lipase is normal doubt pancreatitis.  Gallbladder and liver enzymes are within normal limits and doubt cholecystitis or hepatobiliary process.  Overall suspect foodborne type illness.  Will prescribe Compazine due to multiple drug allergies including Zofran.  We will also prescribe Protonix as possibly reflux related as well.  Understands return precautions and recommend follow-up with primary care doctor.  This chart was dictated using voice recognition software.  Despite best efforts to proofread,  errors can occur which can change the documentation meaning.   Final Clinical Impression(s) / ED Diagnoses Final diagnoses:  Nausea vomiting and diarrhea    Rx / DC Orders ED Discharge Orders         Ordered    prochlorperazine (COMPAZINE) 10 MG  tablet  2 times daily PRN     Discontinue  Reprint     02/22/20 2127    pantoprazole (PROTONIX) 20 MG tablet  Daily     Discontinue  Reprint     02/22/20 2127           Lennice Sites, DO 02/22/20 2128

## 2020-02-22 NOTE — ED Triage Notes (Signed)
Pt c/o day 3 abd pain, diarrhea, nausea-NAD-steady gait

## 2020-02-29 ENCOUNTER — Encounter: Payer: Self-pay | Admitting: Obstetrics and Gynecology

## 2020-02-29 ENCOUNTER — Ambulatory Visit (INDEPENDENT_AMBULATORY_CARE_PROVIDER_SITE_OTHER): Payer: Medicaid Other | Admitting: Obstetrics and Gynecology

## 2020-02-29 ENCOUNTER — Other Ambulatory Visit: Payer: Self-pay

## 2020-02-29 VITALS — BP 116/81 | HR 79 | Ht 59.0 in | Wt 102.6 lb

## 2020-02-29 DIAGNOSIS — N911 Secondary amenorrhea: Secondary | ICD-10-CM | POA: Diagnosis not present

## 2020-02-29 NOTE — Patient Instructions (Signed)
Call Dr. Charlett Lango office to make appointment for fertility testing/counseling/treatment: [phone].

## 2020-02-29 NOTE — Progress Notes (Signed)
GYNECOLOGY OFFICE FOLLOW UP NOTE  History:  29 y.o. [id number] here today for follow up for secondary amenorrhea. She has taken two months of OCPs and has not had a withdrawal bleed. No complaints.    Past Medical History:  Diagnosis Date  . Anemia   . Gestational trophoblastic neoplasm   . PICC (peripherally inserted central catheter) in place 12/24/2014  . Preterm labor   . Pyelonephritis     Past Surgical History:  Procedure Laterality Date  . DILATION AND EVACUATION N/A 08/10/2014   Procedure: DILATATION AND EVACUATION;  Surgeon: Lavonia Drafts, MD;  Location: [facility];  Service: Gynecology;  Laterality: N/A;  . DILATION AND EVACUATION N/A 10/08/2017   Procedure: DILATATION AND EVACUATION;  Surgeon: Mora Bellman, MD;  Location: [facility];  Service: Gynecology;  Laterality: N/A;  . HYSTEROSCOPY N/A 08/10/2014   Procedure: HYSTEROSCOPY;  Surgeon: Lavonia Drafts, MD;  Location: [facility];  Service: Gynecology;  Laterality: N/A;     Current Outpatient Medications:  .  Norethindrone Acetate-Ethinyl Estradiol (LOESTRIN 1.5/30, 21,) 1.5-30 MG-MCG tablet, Take 1 tablet by mouth daily., Disp: 1 Package, Rfl: 11 .  pantoprazole (PROTONIX) 20 MG tablet, Take 1 tablet (20 mg total) by mouth daily for 14 days., Disp: 14 tablet, Rfl: 0 .  prochlorperazine (COMPAZINE) 10 MG tablet, Take 1 tablet (10 mg total) by mouth 2 (two) times daily as needed for up to 10 doses for nausea or vomiting., Disp: 10 tablet, Rfl: 0 .  trimethoprim-polymyxin b (POLYTRIM) ophthalmic solution, Place 1 drop into both eyes every 4 (four) hours. (Patient not taking: Reported on 02/29/2020), Disp: 10 mL, Rfl: 0  The following portions of the patient's history were reviewed and updated as appropriate: allergies, current medications, past family history, past medical history, past social history, past surgical history and problem list.   Review of Systems:  Pertinent items noted in HPI and remainder of  comprehensive ROS otherwise negative.   Objective:  Physical Exam BP 116/81   Pulse 79   Ht 4\' 11"  (1.499 m)   Wt 102 lb 9.6 oz (46.5 kg)   BMI 20.72 kg/m  CONSTITUTIONAL: Well-developed, well-nourished female in no acute distress.  HENT:  Normocephalic, atraumatic. External right and left ear normal. Oropharynx is clear and moist EYES: Conjunctivae and EOM are normal. Pupils are equal, round, and reactive to light. No scleral icterus.  NECK: Normal range of motion, supple, no masses SKIN: Skin is warm and dry. No rash noted. Not diaphoretic. No erythema. No pallor. NEUROLOGIC: Alert and oriented to person, place, and time. Normal reflexes, muscle tone coordination. No cranial nerve deficit noted. PSYCHIATRIC: Normal mood and affect. Normal behavior. Normal judgment and thought content. CARDIOVASCULAR: Normal heart rate noted RESPIRATORY: Effort normal, no problems with respiration noted ABDOMEN: Soft, no distention noted.   PELVIC: deferred MUSCULOSKELETAL: Normal range of motion. No edema noted.  Labs and Imaging No results found.  Assessment & Plan:  1. Secondary amenorrhea Labs all within normal limits No bleed with progesterone challenge No bleed with OCPs With h/o 2 D&Cs, suspect intrauterine adhesions, reviewed need for D&C to remove adhesions, recommendation for this to be done with [facility] given that she is desiring pregnancy, she is agreeable, but unsure if insurance will pay for it - refer to [facility], return if insurance does not cover - Ambulatory referral to Endocrinology   Routine preventative health maintenance measures emphasized. Please refer to After Visit Summary for other counseling recommendations.   Return for no follow up  needed at this time.  Total face-to-face time with patient: 22 minutes. Over 50% of encounter was spent on counseling and coordination of care.  Feliz Beam, M.D. Attending Center for [employer] farm manager)

## 2020-02-29 NOTE — Progress Notes (Signed)
Pt states having pain in lower abdomen off & on.

## 2020-04-04 ENCOUNTER — Telehealth: Payer: Self-pay

## 2020-04-04 NOTE — Telephone Encounter (Signed)
Pt called and left VM on nurse line requesting a call back. Pt states this is her 3rd week trying to call the office. Per chart review, no prior telephone encounters documented. Unable to hear complete VM.

## 2020-04-05 NOTE — Telephone Encounter (Signed)
Patient's concerns addressed via mychart 

## 2020-05-17 ENCOUNTER — Encounter: Payer: Self-pay | Admitting: Obstetrics and Gynecology

## 2020-05-17 ENCOUNTER — Ambulatory Visit (INDEPENDENT_AMBULATORY_CARE_PROVIDER_SITE_OTHER): Payer: Medicaid Other | Admitting: Obstetrics and Gynecology

## 2020-05-17 ENCOUNTER — Other Ambulatory Visit: Payer: Self-pay

## 2020-05-17 VITALS — BP 127/84 | HR 85 | Ht <= 58 in | Wt 101.0 lb

## 2020-05-17 DIAGNOSIS — Z23 Encounter for immunization: Secondary | ICD-10-CM

## 2020-05-17 DIAGNOSIS — N911 Secondary amenorrhea: Secondary | ICD-10-CM

## 2020-05-17 NOTE — Progress Notes (Signed)
   GYNECOLOGY OFFICE FOLLOW UP NOTE  History:  29 y.o. [id number] here today for follow up for secondary amenorrhea. Has been referred to [facility] for secondary amenorrhea and presumed intrauterine adhesions. She reports she was told that there would be a huge out of pocket cost and came back here for [facility] for it to be covered by insurance.     Past Medical History:  Diagnosis Date  . Anemia   . Gestational trophoblastic neoplasm   . PICC (peripherally inserted central catheter) in place 12/24/2014  . Preterm labor   . Pyelonephritis     Past Surgical History:  Procedure Laterality Date  . DILATION AND EVACUATION N/A 08/10/2014   Procedure: DILATATION AND EVACUATION;  Surgeon: Lavonia Drafts, MD;  Location: [facility];  Service: Gynecology;  Laterality: N/A;  . DILATION AND EVACUATION N/A 10/08/2017   Procedure: DILATATION AND EVACUATION;  Surgeon: Mora Bellman, MD;  Location: [facility];  Service: Gynecology;  Laterality: N/A;  . HYSTEROSCOPY N/A 08/10/2014   Procedure: HYSTEROSCOPY;  Surgeon: Lavonia Drafts, MD;  Location: [facility];  Service: Gynecology;  Laterality: N/A;     Current Outpatient Medications:  .  pantoprazole (PROTONIX) 20 MG tablet, Take 1 tablet (20 mg total) by mouth daily for 14 days., Disp: 14 tablet, Rfl: 0  The following portions of the patient's history were reviewed and updated as appropriate: allergies, current medications, past family history, past medical history, past social history, past surgical history and problem list.   Review of Systems:  Pertinent items noted in HPI and remainder of comprehensive ROS otherwise negative.   Objective:  Physical Exam BP 127/84   Pulse 85   Ht 4\' 10"  (1.473 m)   Wt 101 lb (45.8 kg)   BMI 21.11 kg/m  CONSTITUTIONAL: Well-developed, well-nourished female in no acute distress.  HENT:  Normocephalic, atraumatic. External right and left ear normal. Oropharynx is clear and moist EYES: Conjunctivae and EOM are normal.  Pupils are equal, round, and reactive to light. No scleral icterus.  NECK: Normal range of motion, supple, no masses SKIN: Skin is warm and dry. No rash noted. Not diaphoretic. No erythema. No pallor. NEUROLOGIC: Alert and oriented to person, place, and time. Normal reflexes, muscle tone coordination. No cranial nerve deficit noted. PSYCHIATRIC: Normal mood and affect. Normal behavior. Normal judgment and thought content. CARDIOVASCULAR: Normal heart rate noted RESPIRATORY: Effort normal, no problems with respiration noted ABDOMEN: Soft, no distention noted.   PELVIC: deferred MUSCULOSKELETAL: Normal range of motion. No edema noted.  Labs and Imaging No results found.  Assessment & Plan:  1. Flu vaccine need - Flu Vaccine QUAD 36+ mos IM  2. Secondary amenorrhea Reviewed D&C for suspected uterine adhesions, risks/benefits. Reviewed there is no guarantee that she will be able to get pregnant. Patient wants guarantee of pregnancy after surgery, I reviewed that if there are adhesions and I am able to remove/resect, there will still be no guarantee of pregnancy. Reviewed she would still need to see REI if she were to not get periods. Patient would like referral back to [facility]I. - Ambulatory referral to Endocrinology   Routine preventative health maintenance measures emphasized. Please refer to After Visit Summary for other counseling recommendations.   Return if symptoms worsen or fail to improve.  Total face-to-face time with patient: 18 minutes. Over 50% of encounter was spent on counseling and coordination of care.  Feliz Beam, M.D. Attending Center for [employer] farm manager)

## 2020-05-24 ENCOUNTER — Ambulatory Visit: Payer: Medicaid Other | Admitting: Medical

## 2020-05-29 ENCOUNTER — Encounter: Payer: Self-pay | Admitting: Medical

## 2020-05-29 ENCOUNTER — Ambulatory Visit: Payer: Medicaid Other | Admitting: Medical

## 2020-05-29 ENCOUNTER — Other Ambulatory Visit: Payer: Self-pay

## 2020-05-29 VITALS — BP 110/72 | HR 81 | Ht <= 58 in | Wt 103.4 lb

## 2020-05-29 DIAGNOSIS — H938X3 Other specified disorders of ear, bilateral: Secondary | ICD-10-CM | POA: Diagnosis not present

## 2020-05-29 DIAGNOSIS — H9193 Unspecified hearing loss, bilateral: Secondary | ICD-10-CM | POA: Insufficient documentation

## 2020-05-29 DIAGNOSIS — H68013 Acute Eustachian salpingitis, bilateral: Secondary | ICD-10-CM | POA: Diagnosis not present

## 2020-05-29 MED ORDER — AMOXICILLIN 875 MG PO TABS
875.0000 mg | ORAL_TABLET | Freq: Two times a day (BID) | ORAL | 0 refills | Status: DC
Start: 2020-05-29 — End: 2021-04-09

## 2020-05-29 NOTE — Progress Notes (Signed)
Subjective:   Shelly Koch is a 29 y.o. female who presents with ear concern.  She feels like her hearing is decreased for the last 2 weeks.  At times a little ear pressure but no significant pain.  She has had some drainage in the back of her throat but no major cough or cold symptoms.  One of her children has had a little cold recently.  No recent loud noises.  No pop in the ear.  No ear drainage.  No fever.  Otherwise normal state of health.  No other aggravating or relieving factors.  No other c/o.  Past Medical History:  Diagnosis Date  . Anemia   . Gestational trophoblastic neoplasm   . PICC (peripherally inserted central catheter) in place 12/24/2014  . Preterm labor   . Pyelonephritis     ROS as in subjective   Objective: BP 110/72   Pulse 81   Ht 4\' 10"  (1.473 m)   Wt 103 lb 6.4 oz (46.9 kg)   SpO2 98%   BMI 21.61 kg/m    General appearance: Alert, WD/WN, no distress, mildly ill appearing                             Skin: warm, no rash                           Head: no sinus tenderness                            Eyes: conjunctiva normal, corneas clear, PERRLA                            Ears: Bilateral TMs somewhat bulging and slight pinkish coloration, external ear canals normal                          Nose: septum midline, turbinates swollen, with erythema and clear discharge             Mouth/throat: MMM, tongue normal, mild pharyngeal erythema                           Neck: supple, no adenopathy, no thyromegaly, nontender             Assessment  Encounter Diagnoses  Name Primary?  . Eustachian salpingitis, acute, bilateral Yes  . Pressure sensation in both ears   . Decreased hearing of both ears       Plan: Hearing screen abnormal but I suspect her symptoms are related to eustachian tube dysfunction.  Recommendations:  Drink plenty of water throughout the day  Swallow regularly   Consider using nasal spray such as Flonase nasal spray over the  counter 1 spray each nostril daily for the next 1- 2 weeks  I gave you samples of Norel AD medication to use 1 tablet twice daily.  This can cause drowsiness, so use either 2 times daily or just in the evening  If not improving in the next 3-4 days or if worse pain, begin Amoxicillin twice daily for 10 days for ear infection  Shelly Koch was seen today for hearing problem.  Diagnoses and all orders for this visit:  Eustachian salpingitis, acute, bilateral  Pressure sensation in both ears  Decreased hearing  of both ears  Other orders -     amoxicillin (AMOXIL) 875 MG tablet; Take 1 tablet (875 mg total) by mouth 2 (two) times daily.   Patient was advised to call or return if worse or not improving in the next few days.    Patient voiced understanding of diagnosis, recommendations, and treatment plan.  After visit summary given.

## 2020-05-29 NOTE — Patient Instructions (Addendum)
Recommendations:  Drink plenty of water throughout the day  Swallow regularly   Consider using nasal spray such as Flonase nasal spray over the counter 1 spray each nostril daily for the next 1- 2 weeks  I gave you samples of Norel AD medication to use 1 tablet twice daily.  This can cause drowsiness, so use either 2 times daily or just in the evening  If not improving in the next 3-4 days or if worse pain, begin Amoxicillin twice daily for 10 days for ear infection    Eustachian Tube Dysfunction  Eustachian tube dysfunction refers to a condition in which a blockage develops in the narrow passage that connects the middle ear to the back of the nose (eustachian tube). The eustachian tube regulates air pressure in the middle ear by letting air move between the ear and nose. It also helps to drain fluid from the middle ear space. Eustachian tube dysfunction can affect one or both ears. When the eustachian tube does not function properly, air pressure, fluid, or both can build up in the middle ear. What are the causes? This condition occurs when the eustachian tube becomes blocked or cannot open normally. Common causes of this condition include:  Ear infections.  Colds and other infections that affect the nose, mouth, and throat (upper respiratory tract).  Allergies.  Irritation from cigarette smoke.  Irritation from stomach acid coming up into the esophagus (gastroesophageal reflux). The esophagus is the tube that carries food from the mouth to the stomach.  Sudden changes in air pressure, such as from descending in an airplane or scuba diving.  Abnormal growths in the nose or throat, such as: ? Growths that line the nose (nasal polyps). ? Abnormal growth of cells (tumors). ? Enlarged tissue at the back of the throat (adenoids). What increases the risk? You are more likely to develop this condition if:  You smoke.  You are overweight.  You are a child who has: ? Certain  birth defects of the mouth, such as cleft palate. ? Large tonsils or adenoids. What are the signs or symptoms? Common symptoms of this condition include:  A feeling of fullness in the ear.  Ear pain.  Clicking or popping noises in the ear.  Ringing in the ear.  Hearing loss.  Loss of balance.  Dizziness. Symptoms may get worse when the air pressure around you changes, such as when you travel to an area of high elevation, fly on an airplane, or go scuba diving. How is this diagnosed? This condition may be diagnosed based on:  Your symptoms.  A physical exam of your ears, nose, and throat.  Tests, such as those that measure: ? The movement of your eardrum (tympanogram). ? Your hearing (audiometry). How is this treated? Treatment depends on the cause and severity of your condition.  In mild cases, you may relieve your symptoms by moving air into your ears. This is called "popping the ears."  In more severe cases, or if you have symptoms of fluid in your ears, treatment may include: ? Medicines to relieve congestion (decongestants). ? Medicines that treat allergies (antihistamines). ? Nasal sprays or ear drops that contain medicines that reduce swelling (steroids). ? A procedure to drain the fluid in your eardrum (myringotomy). In this procedure, a small tube is placed in the eardrum to:  Drain the fluid.  Restore the air in the middle ear space. ? A procedure to insert a balloon device through the nose to inflate the opening  of the eustachian tube (balloon dilation). Follow these instructions at home: Lifestyle  Do not do any of the following until your health care provider approves: ? Travel to high altitudes. ? Fly in airplanes. ? Work in a Pension scheme manager or room. ? Scuba dive.  Do not use any products that contain nicotine or tobacco, such as cigarettes and e-cigarettes. If you need help quitting, ask your health care provider.  Keep your ears dry. Wear  fitted earplugs during showering and bathing. Dry your ears completely after. General instructions  Take over-the-counter and prescription medicines only as told by your health care provider.  Use techniques to help pop your ears as recommended by your health care provider. These may include: ? Chewing gum. ? Yawning. ? Frequent, forceful swallowing. ? Closing your mouth, holding your nose closed, and gently blowing as if you are trying to blow air out of your nose.  Keep all follow-up visits as told by your health care provider. This is important. Contact a health care provider if:  Your symptoms do not go away after treatment.  Your symptoms come back after treatment.  You are unable to pop your ears.  You have: ? A fever. ? Pain in your ear. ? Pain in your head or neck. ? Fluid draining from your ear.  Your hearing suddenly changes.  You become very dizzy.  You lose your balance. Summary  Eustachian tube dysfunction refers to a condition in which a blockage develops in the eustachian tube.  It can be caused by ear infections, allergies, inhaled irritants, or abnormal growths in the nose or throat.  Symptoms include ear pain, hearing loss, or ringing in the ears.  Mild cases are treated with maneuvers to unblock the ears, such as yawning or ear popping.  Severe cases are treated with medicines. Surgery may also be done (rare). This information is not intended to replace advice given to you by your health care provider. Make sure you discuss any questions you have with your health care provider. Document Revised: 10/28/2017 Document Reviewed: 10/28/2017 Elsevier Patient Education  [employer].

## 2020-06-05 ENCOUNTER — Other Ambulatory Visit: Payer: Self-pay | Admitting: Obstetrics and Gynecology

## 2020-06-05 MED ORDER — MEDROXYPROGESTERONE ACETATE 10 MG PO TABS
10.0000 mg | ORAL_TABLET | Freq: Every day | ORAL | 2 refills | Status: DC
Start: 2020-06-05 — End: 2021-04-09

## 2020-06-05 NOTE — Progress Notes (Signed)
Provera sent to pharmacy

## 2020-09-05 DIAGNOSIS — Z3141 Encounter for fertility testing: Secondary | ICD-10-CM | POA: Diagnosis not present

## 2020-09-05 DIAGNOSIS — N912 Amenorrhea, unspecified: Secondary | ICD-10-CM | POA: Diagnosis not present

## 2021-01-23 ENCOUNTER — Telehealth: Payer: Self-pay

## 2021-01-23 NOTE — Telephone Encounter (Signed)
Pt. Called stating she wanted to schedule a Depo shot here. She stated she used to get it here just been a while. I told her I would have to run that by you first before scheduling a depo shot.

## 2021-01-23 NOTE — Telephone Encounter (Signed)
She sees obgyn so obgyn would need to take this over correct?

## 2021-01-24 NOTE — Telephone Encounter (Signed)
Left message for pt to call back and I also sent mychart message

## 2021-01-25 ENCOUNTER — Other Ambulatory Visit: Payer: Self-pay | Admitting: Obstetrics and Gynecology

## 2021-03-07 ENCOUNTER — Ambulatory Visit (INDEPENDENT_AMBULATORY_CARE_PROVIDER_SITE_OTHER): Payer: Medicaid Other | Admitting: Family Medicine

## 2021-03-07 ENCOUNTER — Encounter: Payer: Self-pay | Admitting: Family Medicine

## 2021-03-07 ENCOUNTER — Other Ambulatory Visit: Payer: Self-pay

## 2021-03-07 VITALS — BP 120/78 | HR 65 | Temp 98.6°F | Wt 102.8 lb

## 2021-03-07 DIAGNOSIS — R109 Unspecified abdominal pain: Secondary | ICD-10-CM | POA: Diagnosis not present

## 2021-03-07 DIAGNOSIS — N949 Unspecified condition associated with female genital organs and menstrual cycle: Secondary | ICD-10-CM

## 2021-03-07 DIAGNOSIS — R1032 Left lower quadrant pain: Secondary | ICD-10-CM | POA: Diagnosis not present

## 2021-03-07 LAB — POCT WET PREP (WET MOUNT)
Clue Cells Wet Prep Whiff POC: NEGATIVE
Trichomonas Wet Prep HPF POC: ABSENT

## 2021-03-07 LAB — POCT URINALYSIS DIP (CLINITEK)
Bilirubin, UA: NEGATIVE
Blood, UA: NEGATIVE
Glucose, UA: NEGATIVE mg/dL
Ketones, POC UA: NEGATIVE mg/dL
Leukocytes, UA: NEGATIVE
Nitrite, UA: NEGATIVE
POC PROTEIN,UA: NEGATIVE
Spec Grav, UA: 1.015 (ref 1.010–1.025)
Urobilinogen, UA: 0.2 E.U./dL
pH, UA: 7 (ref 5.0–8.0)

## 2021-03-07 NOTE — Progress Notes (Signed)
Chief Complaint  Patient presents with   other    Possible yeast infection very itchy inside, started last week when she went to the beach. Pain on lt. Side comes and goes    Complaining of some vaginal dryness and itching for 1 week. Denies discharge  She was in the pool at [facility], worried about an infection related to this. Thought water could be dirty, related to the sand.  No change in products, cleaned herself normally (no douching or new products)  It feels a little better since yesterday. Bothers her at night, not as noticeable when working during the day.  Monogamous relationship, married.  No period x 3 years.  Sees GYN, has been evaluated.  Couldn't get in quickly enough with GYN for this concern Not using contraception, no condom use. H/o gestational trophoblastic neoplasm  She is also complaining today of some pain in LLQ x 3-4 weeks which radiates to back--worse if bladder is full. Sometimes doesn't feel like she empties completely Feels like if she holds her urine for too long, it is hard to start the flow. The pain improves some after voiding. Denies dysuria, urgency/frequency.  PMH, PSH, SH reviewed  Outpatient Encounter Medications as of 03/07/2021  Medication Sig   amoxicillin (AMOXIL) 875 MG tablet Take 1 tablet (875 mg total) by mouth 2 (two) times daily. (Patient not taking: Reported on 03/07/2021)   AUROVELA 1.5/30 1.5-30 MG-MCG tablet TAKE 1 TABLET BY MOUTH DAILY (Patient not taking: Reported on 03/07/2021)   medroxyPROGESTERone (PROVERA) 10 MG tablet Take 1 tablet (10 mg total) by mouth daily. Use for ten days (Patient not taking: Reported on 03/07/2021)   No facility-administered encounter medications on file as of 03/07/2021.   Allergies  Allergen Reactions   Okra     Hives and itching   Ondansetron Hives and Itching   Phenergan [Promethazine] Hives and Itching    Note: pt tolerates PO COMPAZINE   Zofran [Ondansetron Hcl] Rash   ROS: no fever,  chills, URI symptoms.  Vaginal itching, dryness, LLQ and back pain per HPI. No n/v/d, hematuria, vaginal discharge.  See HPI.   PHYSICAL EXAM:  BP 120/78   Pulse 65   Temp 98.6 F (37 C)   Wt 102 lb 12.8 oz (46.6 kg)   BMI 21.49 kg/m   Well-appearing female, in no acute distress HEENT: conjunctiva and sclera are clear, EOMI, wearing mask Neck: no lymphadenopathy or mass Heart: regular rate and rhythm Lungs: clear bilaterally Back: no spinal tenderness.  Tender at L CVA  Abdomen: normal bowel sounds, soft.  Tender at LLQ, no rebound tenderness or guarding, no masses. Extremities: no edema Psych: normal mood, affect Neuro: alert and oriented, normal strength, gait  Urine dip: normal Wet prep (KOH and saline)--normal  ASSESSMENT/PLAN:  Left sided abdominal pain - Plan: POCT URINALYSIS DIP (CLINITEK)  LLQ pain - and L CVA tenderness. Worried about hydronephrosis, blockage. Eval with CT. No e/o infection - Plan: CT RENAL ABD W/WO  Left flank pain - and LLQ pain. Worried about hydronephrosis, blockage. Eval with CT. No e/o infection - Plan: CT RENAL ABD W/WO  Vaginal discomfort - reassured no e/o infection .Can try vagisil. expect to gradually improve - Plan: POCT Wet Prep [facility])  Check CT. May need referral to urologist, depending on results.

## 2021-03-07 NOTE — Patient Instructions (Signed)
There was no evidence of infection in your urine. There was no infection in the vaginal discharge--no evidence of yeast or any other infection.  You may use Vagisil for irritation (not the kind for yeast, since no yeast was present), if needed for the discomfort.  We are ordering a CT scan to further evaluate your left flank and lower abdominal pain.    Please seek re-evaluation if you develop blood in the urine, fever, or worsening pain.

## 2021-03-08 ENCOUNTER — Encounter: Payer: Self-pay | Admitting: Family Medicine

## 2021-03-09 NOTE — Progress Notes (Signed)
I got the appt. For her CT moved up to 03/15/21 at 4p.m. I will call the pt. And let her know that we got it moved up.

## 2021-03-14 ENCOUNTER — Telehealth: Payer: Self-pay | Admitting: Internal Medicine

## 2021-03-14 ENCOUNTER — Ambulatory Visit: Payer: Medicaid Other | Admitting: Obstetrics and Gynecology

## 2021-03-14 NOTE — Telephone Encounter (Signed)
[facility] called and insurance has not approved imaging yet so they will have to reschedule pt until imaging is approved. She did pick up contrast already and they wanted me to advise them what pt needed to do about the contrast. I told her I wasn't sure as we don't handle contrast so she was going to reach out to someone else at [facility] about the contrast. JUST FYI its still pending

## 2021-03-15 ENCOUNTER — Inpatient Hospital Stay: Admission: RE | Admit: 2021-03-15 | Payer: Medicaid Other | Source: Ambulatory Visit

## 2021-04-02 ENCOUNTER — Other Ambulatory Visit: Payer: Medicaid Other

## 2021-04-02 ENCOUNTER — Ambulatory Visit
Admission: RE | Admit: 2021-04-02 | Discharge: 2021-04-02 | Disposition: A | Discharge status: Home / Self Care | Payer: Medicaid Other | Source: Ambulatory Visit | Attending: Family Medicine | Admitting: Family Medicine

## 2021-04-02 DIAGNOSIS — R109 Unspecified abdominal pain: Secondary | ICD-10-CM

## 2021-04-02 DIAGNOSIS — R10A2 Flank pain, left side: Secondary | ICD-10-CM

## 2021-04-02 DIAGNOSIS — R1032 Left lower quadrant pain: Secondary | ICD-10-CM

## 2021-04-08 NOTE — Progress Notes (Signed)
Chief Complaint  Patient presents with   Follow-up    Follow up on abdominal pain, still same-no better or worse.     Patient was seen a month ago with complaints of vaginal dryness and itching without discharge.  Wet prep was normal.  She also had been having 3-4 weeks of pain in LLQ.  That pain was worse with full bladder, sometimes didn't feel like she emptied bladder completely, and the discomfort was somewhat improved after voiding.  She wasn't having any dysuria, urgency or frequency. Urine dip was normal.  CT was performed, concern for possible hydronephrosis, blockage.  IMPRESSION: 1. No acute abnormality in the abdomen or pelvis. 2. Normal variant duplication of the IVC. Commented in body of report on scarring of lower pole of L kidney. No stones or hydronephrosis.  She reports her vaginal dryness and itching resolved. LLQ pain persists.  She reports that at her last visit she had much worse pain when she held her urine, when bladder was full. She still has some LLQ pain, but no longer having any pain at L flank. She still gets some pain in the low back (central).  Denies any change in activity.  Works as a Engineer, civil (consulting). Doesn't notice when it she is busy, not bothersome at work. Notices discomfort when her bladder is full. Sometimes it is worse at night, pain is relieved by Tylenol.  She needed tylenol 2 nights ago. Bowels are normal, denies constipation.  Doesn't feel like gas pain  PMH, PSH, SH reviewed  Outpatient Encounter Medications as of 04/09/2021  Medication Sig   acetaminophen (TYLENOL) 500 MG tablet Take 500 mg by mouth every 6 (six) hours as needed. (Patient not taking: Reported on 04/09/2021)   [DISCONTINUED] amoxicillin (AMOXIL) 875 MG tablet Take 1 tablet (875 mg total) by mouth 2 (two) times daily. (Patient not taking: No sig reported)   [DISCONTINUED] AUROVELA 1.5/30 1.5-30 MG-MCG tablet TAKE 1 TABLET BY MOUTH DAILY (Patient not taking: Reported on 03/07/2021)    [DISCONTINUED] medroxyPROGESTERone (PROVERA) 10 MG tablet Take 1 tablet (10 mg total) by mouth daily. Use for ten days (Patient not taking: Reported on 03/07/2021)   No facility-administered encounter medications on file as of 04/09/2021.   Allergies  Allergen Reactions   Okra     Hives and itching   Ondansetron Hives and Itching   Phenergan [Promethazine] Hives and Itching    Note: pt tolerates PO COMPAZINE   Zofran [Ondansetron Hcl] Rash   ROS: no fever, chills, URI symptoms, headaches, dizziness, chest pain. She is amenorrheic. No n/v/d/constipation, dysuria, vaginal discharge. Some LBP. See HPI.   PHYSICAL EXAM:  BP 110/78   Pulse 64   Ht '4\' 10"'$  (1.473 m)   Wt 104 lb 9.6 oz (47.4 kg)   BMI 21.86 kg/m   Wt Readings from Last 3 Encounters:  04/09/21 104 lb 9.6 oz (47.4 kg)  03/07/21 102 lb 12.8 oz (46.6 kg)  05/29/20 103 lb 6.4 oz (46.9 kg)   Well-appearing female, in no acute distress HEENT: conjunctiva and sclera are clear, EOMI, wearing mask Neck: no lymphadenopathy or mass, no thyromegaly Heart: regular rate and rhythm Lungs: clear bilaterally Back: no spinal tenderness. No CVA tenderness Abdomen: normal bowel sounds, soft.  Very mildly tender at LLQ and suprapubic area. No rebound tenderness or guarding, no masses. Extremities: no edema Psych: normal mood, affect Neuro: alert and oriented, normal strength, gait  Urine dip: SG 1.010, negative    ASSESSMENT/PLAN:  Abdominal pain, unspecified  abdominal location - mild at suprapubic area and LLQ. Overall improved, r/b tylenol. Reviewed CT, unclear etiology of pain.  - Plan: POCT Urinalysis DIP (Proadvantage Device)  Need for influenza vaccination - Plan: Flu Vaccine QUAD 6+ mos PF IM (Fluarix Quad PF)  Reviewed CT results, reassured her about what things were ruled out. Consider GYN evaluation of her suprapubic/LLQ discomfort (ddx includes GYN issues, cyst, though not seen at time of CT). Still has amenorrhea  that isn't being addressed. She states doctors have left, needs to get in with new one. She plans to schedule GYN visit. To return here for re-evaluation if worsening symptoms in the interim.  Pt reassured, all questions answered to the best of my ability.   Flu shot (ok for medicaid, right?)  Repeat urine if ongoing abd pain  Labs if worse

## 2021-04-09 ENCOUNTER — Encounter: Payer: Self-pay | Admitting: Family Medicine

## 2021-04-09 ENCOUNTER — Other Ambulatory Visit: Payer: Self-pay

## 2021-04-09 ENCOUNTER — Ambulatory Visit (INDEPENDENT_AMBULATORY_CARE_PROVIDER_SITE_OTHER): Payer: Medicaid Other | Admitting: Family Medicine

## 2021-04-09 VITALS — BP 110/78 | HR 64 | Ht <= 58 in | Wt 104.6 lb

## 2021-04-09 DIAGNOSIS — Z23 Encounter for immunization: Secondary | ICD-10-CM

## 2021-04-09 DIAGNOSIS — R109 Unspecified abdominal pain: Secondary | ICD-10-CM | POA: Diagnosis not present

## 2021-04-09 LAB — POCT URINALYSIS DIP (PROADVANTAGE DEVICE)
Bilirubin, UA: NEGATIVE
Blood, UA: NEGATIVE
Glucose, UA: NEGATIVE mg/dL
Ketones, POC UA: NEGATIVE mg/dL
Leukocytes, UA: NEGATIVE
Nitrite, UA: NEGATIVE
Protein Ur, POC: NEGATIVE mg/dL
Specific Gravity, Urine: 1.01
Urobilinogen, Ur: NEGATIVE
pH, UA: 7 (ref 5.0–8.0)

## 2021-04-09 NOTE — Patient Instructions (Signed)
There was nothing wrong with kidney, bladder, or other abdominal and pelvic  organs on your recent CT scan.   Your urine was normal today.  I am glad that we didn't find anything wrong on the scans, but don't have a good reason for why you still have discomfort. You can try some heat when you have pain. It is fine to continue with tylenol, as needed.  I encourage you to follow up with a gynecologist for routine care, ongoing evaluation of your amenorrhea (lack of menstrual cycles), and also for their input regarding your pelvic discomfort.  If you are much worse--worsening pain, fever, vomiting, etc, please seek re-evaluation.

## 2021-05-21 ENCOUNTER — Other Ambulatory Visit: Payer: Self-pay

## 2021-05-21 ENCOUNTER — Encounter: Payer: Self-pay | Admitting: Family Medicine

## 2021-05-21 ENCOUNTER — Telehealth: Payer: Medicaid Other | Admitting: Family Medicine

## 2021-05-21 VITALS — Temp 100.7°F | Wt 104.0 lb

## 2021-05-21 DIAGNOSIS — J029 Acute pharyngitis, unspecified: Secondary | ICD-10-CM

## 2021-05-21 DIAGNOSIS — G4452 New daily persistent headache (NDPH): Secondary | ICD-10-CM | POA: Diagnosis not present

## 2021-05-21 DIAGNOSIS — R509 Fever, unspecified: Secondary | ICD-10-CM | POA: Diagnosis not present

## 2021-05-21 NOTE — Progress Notes (Signed)
   Subjective:    Patient ID: Shelly Koch, female    DOB: 07/12/1991, 30 y.o.   MRN: [id number]  HPI Documentation for virtual audio and video telecommunications through [place] encounter: The patient was located at home. 2 patient identifiers used.  The provider was located in the office. The patient did consent to this visit and is aware of possible charges through their insurance for this visit. The other persons participating in this telemedicine service were none. Time spent on call was 4 minutes and in review of previous records >10 minutes total for counseling and coordination of care. This virtual service is not related to other E/M service within previous 7 days.  She states that Saturday she developed sore throat, fever up to 100.7, slight headache with nasal congestion and occasional cough.  No smell or taste change.  She does complain of some slight neck pain.  She states that today she does feel slightly better.  She has been alternating ibuprofen and Tylenol every couple hours and does get some relief with that.  Review of Systems     Objective:   Physical Exam Alert and in no distress otherwise not examined       Assessment & Plan:  Fever, unspecified fever cause  Sore throat  New daily persistent headache Since she is feeling slightly better I will treat her conservatively and recommend continue with alternating Tylenol and ibuprofen.  She will call me tomorrow if she is not continuing to improve.  Recommend she do the COVID test and if negative, call for an appointment.  She was comfortable with that.

## 2021-05-28 ENCOUNTER — Ambulatory Visit: Payer: Medicaid Other | Admitting: Family Medicine

## 2021-05-28 ENCOUNTER — Other Ambulatory Visit: Payer: Self-pay

## 2021-05-28 ENCOUNTER — Encounter: Payer: Self-pay | Admitting: Family Medicine

## 2021-05-28 VITALS — BP 110/64 | HR 80 | Temp 99.1°F | Ht <= 58 in | Wt 103.6 lb

## 2021-05-28 DIAGNOSIS — R6884 Jaw pain: Secondary | ICD-10-CM | POA: Diagnosis not present

## 2021-05-28 DIAGNOSIS — R509 Fever, unspecified: Secondary | ICD-10-CM | POA: Diagnosis not present

## 2021-05-28 DIAGNOSIS — K1121 Acute sialoadenitis: Secondary | ICD-10-CM

## 2021-05-28 DIAGNOSIS — H9202 Otalgia, left ear: Secondary | ICD-10-CM | POA: Diagnosis not present

## 2021-05-28 LAB — CBC WITH DIFFERENTIAL/PLATELET
Basophils Absolute: 0 10*3/uL (ref 0.0–0.2)
Basos: 0 %
EOS (ABSOLUTE): 0.2 10*3/uL (ref 0.0–0.4)
Eos: 2 %
Hematocrit: 34.1 % (ref 34.0–46.6)
Hemoglobin: 11.4 g/dL (ref 11.1–15.9)
Immature Grans (Abs): 0 10*3/uL (ref 0.0–0.1)
Immature Granulocytes: 0 %
Lymphocytes Absolute: 2.9 10*3/uL (ref 0.7–3.1)
Lymphs: 31 %
MCH: 31.1 pg (ref 26.6–33.0)
MCHC: 33.4 g/dL (ref 31.5–35.7)
MCV: 93 fL (ref 79–97)
Monocytes Absolute: 0.6 10*3/uL (ref 0.1–0.9)
Monocytes: 7 %
Neutrophils Absolute: 5.4 10*3/uL (ref 1.4–7.0)
Neutrophils: 60 %
Platelets: 199 10*3/uL (ref 150–450)
RBC: 3.67 x10E6/uL — ABNORMAL LOW (ref 3.77–5.28)
RDW: 11.3 % — ABNORMAL LOW (ref 11.7–15.4)
WBC: 9.1 10*3/uL (ref 3.4–10.8)

## 2021-05-28 MED ORDER — AMOXICILLIN-POT CLAVULANATE 875-125 MG PO TABS: 1.0000 | ORAL_TABLET | Freq: Two times a day (BID) | ORAL | 0 refills | Status: DC

## 2021-05-28 NOTE — Progress Notes (Signed)
Chief Complaint  Patient presents with   Ear Pain    Did virtual visit with J[facility] last week. L ear pain is worsening and her glad is swollen. Has fever due to ear/swollen gland. No body aches or chills. No cough or runny nose. Has HA and dizziness.     Patient presents with complaint of L ear pain, swollen gland, pain in neck  Had virtual visit 1 week ago with Dr. Redmond School. She had fever, ST, headache, congestion and cough. She was already feeling a little better on day of visit.  She was told to do a COVID test.  She reports it was negative. Her ears were bothering her some then, L>R.  These symptoms had resolved, even the fever.  L ear has been itching for the last few days, and since yesterday she developed L ear pain, and swelling in the L neck and side of her face. R ear discomfort resolved. Fever had resolved, but recurred on Friday 11/4. Head hurts on the left side, no other area of headache.  Slight lightheadedness. No vertigo. Denies decrease in hearing.   PMH, PSH, SH reviewed  Outpatient Encounter Medications as of 05/28/2021  Medication Sig Note   acetaminophen (TYLENOL) 500 MG tablet Take 500 mg by mouth every 6 (six) hours as needed. 05/28/2021: Last dose 10:45am   ibuprofen (ADVIL) 200 MG tablet Take 600 mg by mouth every 6 (six) hours as needed. 05/28/2021: Last dose 3am   No facility-administered encounter medications on file as of 05/28/2021.   Allergies  Allergen Reactions   Okra     Hives and itching   Ondansetron Hives and Itching   Phenergan [Promethazine] Hives and Itching    Note: pt tolerates PO COMPAZINE   Zofran [Ondansetron Hcl] Rash    ROS:  Denies n/v/d. +fever and L ear pain, neck swelling per HPI. Denies myalgias, chills, cough, runny nose. She feels a little lightheaded. No chest pain, shortness of breath. No other concerns except as noted in HPI.   PHYSICAL EXAM:  BP 110/64   Pulse 80   Temp 99.1 F (37.3 C) (Tympanic)   Ht 4\' 10"  (1.473 m)    Wt 103 lb 9.6 oz (47 kg)   BMI 21.65 kg/m   Pleasant female, appears to be in moderate distress, tearful, during palpation of painful/swollen area near ear. Otherwise appeared comfortable throughout the visit. HEENT: conjunctiva and sclera are clear, EOMI.  TM's and EAC's normal (perhaps slightly yellow in the left, mild effusion, but no bulging or erythema).  EAC normal. OP is clear. She is nontender at TMJ, no clicking/popping.  She is tender just below this, in the lower jaw, extending posteriorly to behind there ear with associated STS. No overlying erythema or warmth.  Firm, no fluctuance, very tender. She is tender along anterior and posterior neck, but has FROM, and no palpable LAD. Heart: regular rate and rhythm Lungs: clear Neuro: alert and oriented, cranial nerves grossly intact, normal gait Psych: normal mood, affect, hygiene and grooming.  ASSESSMENT/PLAN:  Parotitis, acute - suspected, though swelling extends behind ear, ?mastoiditis. Will cover with augmentin, close f/u. May need CT if not responding (vs ENT) - Plan: CBC with Differential/Platelet, amoxicillin-clavulanate (AUGMENTIN) 875-125 MG tablet  Fever, unspecified fever cause - Plan: CBC with Differential/Platelet  Left ear pain - no e/o acute otitis--pain is more inferior (in jaw) and posterior  Jaw pain  Fever, with pain and swelling at L jaw and ear.  ?parotitis vs mastoiditis. No  e/o bacterial otitis.  Swelling seems a little more inferior than typical parotid, and extending posteriorly. Will cover with augmentin, check CBC and f/u in 2-3 days.  May need imaging (and/or referral) if not responding.   Augmentin, CBC Fu 2-3d--pt also seen briefly by [facility] today, can f/u with either of [place] for recheck.

## 2021-05-28 NOTE — Patient Instructions (Signed)
I suspect that you have an infection in your salivary gland, though I'm concerned also about the swelling over the mastoid bone (behind your left ear). You do not appear to have an ear infection.  We are going to start you on an antibiotic, and have you return in 2-3 days for a recheck. If your fever persists, and swelling gets worse, you will need to have a CT scan to better look at the area.  Continue to use ibuprofen and tylenol for the pain.   Parotitis Parotitis is inflammation of one or both of the parotid glands. These glands produce saliva. They are found on each side of the face, below and in front of the earlobes. The saliva that they produce comes out of tiny openings (ducts) inside the cheeks. Parotitis may cause sudden swelling and pain (acute parotitis). It can also cause repeated episodes of swelling and pain or continued swelling that may or may not be painful (chronic parotitis). What are the causes? This condition may be caused by: Infections from bacteria. Infections from viruses, such as mumps or HIV. Blockage (obstruction) of saliva flow through the parotid glands. This can be from a stone, scar tissue, or a tumor. Diseases that cause your body's defense system (immune system) to attack healthy cells in your salivary glands. These are called autoimmune diseases. What increases the risk? You are more likely to develop this condition if: You are 44 years old or older. You do not drink enough fluids (are dehydrated). You drink too much alcohol. You have: A dry mouth. Diabetes. Gout. A long-term illness. You do not take good care of your mouth and teeth (poor oral hygiene). You have had radiation treatments to the head and neck. You take certain medicines. What are the signs or symptoms? Symptoms of this condition depend on the cause. Symptoms may include: Swelling under and in front of the ear. This may get worse after eating. Pain and tenderness over the parotid  gland. This may get worse after eating. Redness and warmth of the skin over the parotid gland. Fever or chills. Pus coming from the ducts inside the mouth. Dry mouth. A bad taste in the mouth. How is this diagnosed? This condition may be diagnosed based on: Your medical history. A physical exam. Tests to find the cause of the parotitis. These may include: Doing blood tests to check for an autoimmune disease or infections from a virus. Taking a fluid sample from the parotid gland and testing it for infection. Injecting the ducts of the parotid gland with a dye and then taking X-rays (sialogram). Having other imaging tests of the gland, such as X-rays, ultrasound, MRI, or CT scan. Checking the opening of the gland for a stone or obstruction. Placing a needle into the gland to remove tissue for a biopsy (fine needle aspiration). How is this treated? Treatment for this condition depends on the cause. Treatment may include: Antibiotic medicine for a bacterial infection. NSAIDs, such as ibuprofen, to treat pain and swelling. Drinking more fluids. Removing a stone or obstruction. Treating an underlying disease that is causing parotitis. Surgery to drain an infection, remove a growth, or remove the whole gland (parotidectomy). Treatment may not be needed if parotid swelling goes away with home care. Follow these instructions at home: Medicines  Take over-the-counter and prescription medicines only as told by your health care provider. If you were prescribed an antibiotic medicine, take it as told by your health care provider. Do not stop taking the antibiotic  even if you start to feel better. Managing pain and swelling If directed, apply heat to the affected area as often as told by your health care provider. Use the heat source that your health care provider recommends, such as a moist heat pack or a heating pad. Place a towel between your skin and the heat source. Leave the heat on for  20-30 minutes. Remove the heat if your skin turns bright red. This is especially important if you are unable to feel pain, heat, or cold. You have a greater risk of getting burned. Gargle with a mixture of salt and water 3-4 times a day or as needed. To make salt water, completely dissolve -1 tsp (3-6 g) of salt in 1 cup (237 mL) of warm water. Gently massage the parotid glands as told by your health care provider. General instructions  Drink enough fluid to keep your urine pale yellow. Keep your mouth clean and moist. Try sucking on sour candy. This may help to make your mouth less dry by stimulating the flow of saliva. Maintain good oral health. Brush your teeth at least two times a day. Floss your teeth every day. See your dentist regularly. Do not use any products that contain nicotine or tobacco. These products include cigarettes, chewing tobacco, and vaping devices, such as e-cigarettes. If you need help quitting, ask your health care provider. Do not drink alcohol. Keep all follow-up visits. This is important. Contact a health care provider if: You have a fever or chills. You have new symptoms. Your symptoms get worse. Your symptoms do not improve with treatment. Get help right away if: You have difficulty breathing or swallowing because of the swollen gland. These symptoms may represent a serious problem that is an emergency. Do not wait to see if the symptoms will go away. Get medical help right away. Call your local emergency services (911 in the [place] Do not drive yourself to the hospital. Summary Parotitis is inflammation of one or both of the parotid glands. Symptoms include pain and swelling under and in front of the ear. They may also include a fever and a bad taste in your mouth. This condition may be treated with antibiotics, NSAIDs, increasing fluids, or surgery. In some cases, parotitis may go away on its own without treatment. You should drink plenty of fluids,  maintain good oral health, and do not use products that contain nicotine or tobacco. This information is not intended to replace advice given to you by your health care provider. Make sure you discuss any questions you have with your health care provider. Document Revised: 11/17/2020 Document Reviewed: 11/17/2020   Elsevier Patient Education  [place].

## 2021-05-30 NOTE — Progress Notes (Signed)
Chief Complaint  Patient presents with   Follow-up    Patient is here for follow up, states she feel so much better. Only having a little trouble turning her head but otherwise good.    Patient presents for 3 d f/u on L ear/jaw pain and swelling. She was started on Augmentin.  Today she reports that the swelling is significantly improved, but still has some pain (but less) Has some pain with turning her head to the left.  She took Tylenol for pain today, but denied any fever today prior to taking.  She still had LG fever yesterday.  She reports having some slight itchiness in throat when she took the augmentin, worse on the first day, less the second.  This only lasted for about 15 minutes.  She just took the medicine, and hasn't noticed this yet.  She is having some throat-clearing during visit.  Notes a change in the taste Denies any rash.  She had been constipated, is now having good bowel movements.  Denies diarrhea.  PMH, PSH, SH reviewed  Outpatient Encounter Medications as of 05/31/2021  Medication Sig Note   acetaminophen (TYLENOL) 500 MG tablet Take 500 mg by mouth every 6 (six) hours as needed. 05/31/2021: Last dose 6am   amoxicillin-clavulanate (AUGMENTIN) 875-125 MG tablet Take 1 tablet by mouth 2 (two) times daily.    ibuprofen (ADVIL) 200 MG tablet Take 600 mg by mouth every 6 (six) hours as needed. (Patient not taking: Reported on 05/31/2021)    No facility-administered encounter medications on file as of 05/31/2021.   Allergies  Allergen Reactions   Okra     Hives and itching   Ondansetron Hives and Itching   Phenergan [Promethazine] Hives and Itching    Note: pt tolerates PO COMPAZINE   Zofran [Ondansetron Hcl] Rash   ROS:  fever improving, no URI symptoms, some PND/throat-clearing.  Denies sore throat. Nausea, vomiting, diarrhea, rash or other concerns.   PHYSICAL EXAM:  BP 110/64   Pulse 72   Temp 97.7 F (36.5 C) (Tympanic)   Ht 4\' 10"  (1.473 m)   Wt 103  lb 9.6 oz (47 kg)   BMI 21.65 kg/m   Well-appearing, pleasant female, in good spirits, in no distress. Frequent throat-clearing during visit There is no visible swelling of the left neck today. HEENT: conjunctiva and sclera are clear, EOMI, TM's and EAC's normal.nasal mucosa is mildly edematous, slight clear drainage on the left.  OP is clear Neck: there is one small, tender LN just below/anterior to mastoid, below and slightly inferior to the L ear.  No other lymphadenopathy is noted.  Some discomfort with looking to L, but good ROM. Neuro: alert and oriented, cranial nerves intact, normal gait.   ASSESSMENT/PLAN:  Parotitis, acute - with neck swelling and posterior ear/mastoid swelling.  Significant improvement noted with 3d of ABX, continue  Seasonal allergies - this may account for her intermittent throat itching and throat clearing (no e/o allergic reaction to ABX). Can try antihistamine prn.  Complete the course of antibiotics. I'm so glad that it improved so quickly. The residual swollen gland can take a few weeks to completely resolve.  If you develop diarrhea, you can try taking a probiotic.  You can try taking an antihistamine (ie claritin or allegra or zyrtec) if the postnasal drainage/throat-clearing is bothersome.  Return if symptoms recur or never completely resolve.

## 2021-05-31 ENCOUNTER — Other Ambulatory Visit: Payer: Self-pay

## 2021-05-31 ENCOUNTER — Ambulatory Visit: Payer: Medicaid Other | Admitting: Family Medicine

## 2021-05-31 ENCOUNTER — Encounter: Payer: Self-pay | Admitting: Family Medicine

## 2021-05-31 VITALS — BP 110/64 | HR 72 | Temp 97.7°F | Ht <= 58 in | Wt 103.6 lb

## 2021-05-31 DIAGNOSIS — K1121 Acute sialoadenitis: Secondary | ICD-10-CM | POA: Diagnosis not present

## 2021-05-31 DIAGNOSIS — J302 Other seasonal allergic rhinitis: Secondary | ICD-10-CM

## 2021-05-31 NOTE — Patient Instructions (Signed)
  Complete the course of antibiotics. I'm so glad that it improved so quickly. The residual swollen gland can take a few weeks to completely resolve.  If you develop diarrhea, you can try taking a probiotic.  You can try taking an antihistamine (ie claritin or allegra or zyrtec) if the postnasal drainage/throat-clearing is bothersome.  Return if symptoms recur or never completely resolve.

## 2021-08-29 DIAGNOSIS — R509 Fever, unspecified: Secondary | ICD-10-CM | POA: Diagnosis not present

## 2021-08-29 DIAGNOSIS — Z20822 Contact with and (suspected) exposure to covid-19: Secondary | ICD-10-CM | POA: Diagnosis not present

## 2021-08-29 DIAGNOSIS — J020 Streptococcal pharyngitis: Secondary | ICD-10-CM | POA: Diagnosis not present

## 2021-09-12 ENCOUNTER — Other Ambulatory Visit: Payer: Self-pay

## 2021-09-12 ENCOUNTER — Ambulatory Visit (HOSPITAL_COMMUNITY)
Admission: EM | Admit: 2021-09-12 | Discharge: 2021-09-12 | Disposition: A | Discharge status: Home / Self Care | Payer: Medicaid Other | Attending: Family Medicine | Admitting: Family Medicine

## 2021-09-12 ENCOUNTER — Encounter (HOSPITAL_COMMUNITY): Payer: Self-pay | Admitting: Emergency Medicine

## 2021-09-12 DIAGNOSIS — J020 Streptococcal pharyngitis: Secondary | ICD-10-CM

## 2021-09-12 LAB — POCT RAPID STREP A, ED / UC: Streptococcus, Group A Screen (Direct): POSITIVE — AB

## 2021-09-12 MED ORDER — AMOXICILLIN 875 MG PO TABS: 875.0000 mg | ORAL_TABLET | Freq: Two times a day (BID) | ORAL | 0 refills | Status: DC

## 2021-09-12 MED ORDER — AMOXICILLIN 875 MG PO TABS: 875.0000 mg | ORAL_TABLET | Freq: Two times a day (BID) | ORAL | 0 refills | Status: AC

## 2021-09-12 MED ORDER — IBUPROFEN 800 MG PO TABS: 800.0000 mg | ORAL_TABLET | Freq: Three times a day (TID) | ORAL | 0 refills | Status: DC | PRN

## 2021-09-12 NOTE — ED Provider Notes (Addendum)
[facility]    CSN: [id number] Arrival date & time: 09/12/21  1927      History   Chief Complaint Chief Complaint  Patient presents with   Sore Throat    HPI Shelly Koch is a 31 y.o. female.    Sore Throat  Here for a 1 day history of sore throat and fever.  She has had no cough or rhinorrhea.  She also has pain in her anterior neck.  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/29/2020   Concern about eye disease without diagnosis 11/01/2019   Pelvic pain 11/24/2018   Nausea 11/24/2018   Dysuria 11/23/2018   Chronic bilateral low back pain without sciatica 11/23/2018   Secondary amenorrhea 11/23/2018   Screening for cervical cancer 11/23/2018   Symptomatic anemia 10/08/2017   Indication for care in labor or delivery 09/17/2017   SVD (spontaneous vaginal delivery) 09/17/2017   Uterine size date discrepancy 09/13/2017   Poor weight gain of pregnancy, unspecified trimester 04/17/2017   Iron deficiency anemia during pregnancy 04/17/2017   Supervision of high risk pregnancy, antepartum 03/05/2017   History of gestational hypertension 03/05/2017   Lower abdominal pain 12/14/2014   Encounter for contraceptive management 11/23/2014   Environmental and seasonal allergies 10/18/2014   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   History of poor fetal growth 06/04/2014   Elevated DSR     Past Surgical History:  Procedure Laterality Date   DILATION AND EVACUATION N/A 08/10/2014   Procedure: DILATATION AND EVACUATION;  Surgeon: Lavonia Drafts, MD;   Location: [facility];  Service: Gynecology;  Laterality: N/A;   DILATION AND EVACUATION N/A 10/08/2017   Procedure: DILATATION AND EVACUATION;  Surgeon: Mora Bellman, MD;  Location: [facility];  Service: Gynecology;  Laterality: N/A;   HYSTEROSCOPY N/A 08/10/2014   Procedure: HYSTEROSCOPY;  Surgeon: Lavonia Drafts, MD;  Location: [facility];  Service: Gynecology;  Laterality: N/A;    OB History     Gravida  2   Para  2   Term  2   Preterm      AB      Living  2      SAB      IAB      Ectopic      Multiple  0   Live Births  2            Home Medications    Prior to Admission medications   Medication Sig Start Date End Date Taking? Authorizing Provider  amoxicillin (AMOXIL) 875 MG tablet Take 1 tablet (875 mg total) by mouth 2 (two) times daily for 10 days. 09/12/21 09/22/21  Barrett Henle, MD  ibuprofen (ADVIL) 800 MG tablet Take 1 tablet (800 mg total) by mouth every 8 (eight) hours as needed (pain). 09/12/21   Barrett Henle, MD    Family History Family History  Problem Relation Age of Onset   Alcohol abuse Neg Hx     Social History Social History   Tobacco Use   Smoking status: Never   Smokeless  tobacco: Never  Vaping Use   Vaping Use: Never used  Substance Use Topics   Alcohol use: No   Drug use: No     Allergies   Okra, Ondansetron, Phenergan [promethazine], and Zofran [ondansetron hcl]   Review of Systems Review of Systems   Physical Exam Triage Vital Signs ED Triage Vitals  Enc Vitals Group     BP 09/12/21 1952 107/77     Pulse Rate 09/12/21 1952 76     Resp 09/12/21 1952 18     Temp 09/12/21 1952 98.1 F (36.7 C)     Temp Source 09/12/21 1952 Oral     SpO2 09/12/21 1952 99 %     Weight 09/12/21 1951 103 lb 9.9 oz (47 kg)     Height 09/12/21 1951 4\' 10"  (1.473 m)     Head Circumference --      Peak Flow --      Pain Score 09/12/21 1950 8     Pain Loc --      Pain Edu? --      Excl. in [place]? --    No data  found.  Updated Vital Signs BP 107/77 (BP Location: Left Arm)    Pulse 76    Temp 98.1 F (36.7 C) (Oral)    Resp 18    Ht 4\' 10"  (1.473 m)    Wt 47 kg    SpO2 99%    BMI 21.66 kg/m   Visual Acuity Right Eye Distance:   Left Eye Distance:   Bilateral Distance:    Right Eye Near:   Left Eye Near:    Bilateral Near:     Physical Exam Vitals reviewed.  Constitutional:      General: She is not in acute distress.    Appearance: She is not toxic-appearing.  HENT:     Right Ear: Tympanic membrane and ear canal normal.     Left Ear: Tympanic membrane and ear canal normal.     Nose: Nose normal.     Mouth/Throat:     Mouth: Mucous membranes are moist.     Comments: There is clear exudate and erythema of the posterior oropharynx. No asymmetry of the OP/palate. Eyes:     Extraocular Movements: Extraocular movements intact.     Conjunctiva/sclera: Conjunctivae normal.     Pupils: Pupils are equal, round, and reactive to light.  Cardiovascular:     Rate and Rhythm: Normal rate and regular rhythm.     Heart sounds: No murmur heard. Pulmonary:     Effort: Pulmonary effort is normal. No respiratory distress.     Breath sounds: No wheezing, rhonchi or rales.  Musculoskeletal:     Cervical back: Neck supple.  Lymphadenopathy:     Cervical: No cervical adenopathy.  Skin:    Capillary Refill: Capillary refill takes less than 2 seconds.     Coloration: Skin is not jaundiced or pale.  Neurological:     General: No focal deficit present.     Mental Status: She is alert and oriented to person, place, and time.  Psychiatric:        Behavior: Behavior normal.     UC Treatments / Results  Labs (all labs ordered are listed, but only abnormal results are displayed) Labs Reviewed  POCT RAPID STREP A, ED / UC - Abnormal; Notable for the following components:      Result Value   Streptococcus, Group A Screen (Direct) POSITIVE (*)    All other components  within normal limits     EKG   Radiology No results found.  Procedures Procedures (including critical care time)  Medications Ordered in UC Medications - No data to display  Initial Impression / Assessment and Plan / UC Course  I have reviewed the triage vital signs and the nursing notes.  Pertinent labs & imaging results that were available during my care of the patient were reviewed by me and considered in my medical decision making (see chart for details).     Rapid strep test is positive.  We will treat with amoxicillin.  And ibuprofen as needed for pain.  Discussed that if she has for 5 episodes of strep in a year she may need to consider going to an ear nose and throat doctor Final Clinical Impressions(s) / UC Diagnoses   Final diagnoses:  Strep pharyngitis     Discharge Instructions      Your strep test was positive.  Take amoxicillin 875 mg 1 tab twice daily for 10 days.  Take ibuprofen 800 mg 1 every 8 hours as needed for pain.     ED Prescriptions     Medication Sig Dispense Auth. Provider   amoxicillin (AMOXIL) 875 MG tablet  (Status: Discontinued) Take 1 tablet (875 mg total) by mouth 2 (two) times daily for 10 days. 20 tablet Barrett Henle, MD   ibuprofen (ADVIL) 800 MG tablet  (Status: Discontinued) Take 1 tablet (800 mg total) by mouth every 8 (eight) hours as needed (pain). 21 tablet Catheline Hixon, Gwenlyn Perking, MD   amoxicillin (AMOXIL) 875 MG tablet Take 1 tablet (875 mg total) by mouth 2 (two) times daily for 10 days. 20 tablet Barrett Henle, MD   ibuprofen (ADVIL) 800 MG tablet Take 1 tablet (800 mg total) by mouth every 8 (eight) hours as needed (pain). 21 tablet Syvanna Ciolino, Gwenlyn Perking, MD      PDMP not reviewed this encounter.   Barrett Henle, MD 09/12/21 2045    Barrett Henle, MD 09/12/21 2045

## 2021-09-12 NOTE — Discharge Instructions (Addendum)
Your strep test was positive.  Take amoxicillin 875 mg 1 tab twice daily for 10 days.  Take ibuprofen 800 mg 1 every 8 hours as needed for pain.

## 2021-09-12 NOTE — ED Triage Notes (Signed)
Pt reports sore throat x 1 day. Pt states had strep 1 month ago.

## 2021-10-12 ENCOUNTER — Encounter (HOSPITAL_COMMUNITY): Payer: Self-pay | Admitting: Emergency Medicine

## 2021-10-12 ENCOUNTER — Other Ambulatory Visit: Payer: Self-pay

## 2021-10-12 ENCOUNTER — Ambulatory Visit (HOSPITAL_COMMUNITY)
Admission: EM | Admit: 2021-10-12 | Discharge: 2021-10-12 | Disposition: A | Discharge status: Home / Self Care | Payer: Medicaid Other | Attending: Internal Medicine | Admitting: Internal Medicine

## 2021-10-12 DIAGNOSIS — T1591XA Foreign body on external eye, part unspecified, right eye, initial encounter: Secondary | ICD-10-CM | POA: Diagnosis not present

## 2021-10-12 DIAGNOSIS — H18891 Other specified disorders of cornea, right eye: Secondary | ICD-10-CM

## 2021-10-12 MED ORDER — ERYTHROMYCIN 5 MG/GM OP OINT: TOPICAL_OINTMENT | OPHTHALMIC | 0 refills | Status: DC

## 2021-10-12 MED ORDER — FLUORESCEIN SODIUM 1 MG OP STRP
ORAL_STRIP | OPHTHALMIC | Status: AC
  Filled 2021-10-12: qty 1

## 2021-10-12 MED ORDER — TETRACAINE HCL 0.5 % OP SOLN
OPHTHALMIC | Status: AC
  Filled 2021-10-12: qty 4

## 2021-10-12 MED ORDER — EYE WASH OPHTH SOLN
OPHTHALMIC | Status: AC
  Filled 2021-10-12: qty 118

## 2021-10-12 NOTE — Discharge Instructions (Signed)
It appears that you have something in your eye that was washed out.  You have been prescribed antibiotic ointment to take to help prevent infection.  Please follow-up with provided contact information for ophthalmologist on Monday if symptoms persist or worsen. ?

## 2021-10-12 NOTE — ED Provider Notes (Signed)
?[facility] ? ? ? ?CSN: [id number] ?Arrival date & time: 10/12/21  1936 ? ? ?  ? ?History   ?Chief Complaint ?Chief Complaint  ?Patient presents with  ? Eye Pain  ? ? ?HPI ?Shelly Koch is a 31 y.o. female.  ? ?Patient presents with right eye pain and redness that started this morning upon awakening.  She notes purulent drainage from the eye.  Denies trauma or foreign body to the eye.  Denies any associated upper respiratory symptoms or known sick contacts.  Denies fever.  Denies blurry vision.  Patient does not wear contacts. ? ? ?Eye Pain ? ? ?Past Medical History:  ?Diagnosis Date  ? Anemia   ? Gestational trophoblastic neoplasm   ? PICC (peripherally inserted central catheter) in place 12/24/2014  ? Preterm labor   ? Pyelonephritis   ? ? ?Patient Active Problem List  ? Diagnosis Date Noted  ? Eustachian salpingitis, acute, bilateral 05/29/2020  ? Pressure sensation in both ears 05/29/2020  ? Decreased hearing of both ears 05/29/2020  ? Concern about eye disease without diagnosis 11/01/2019  ? Pelvic pain 11/24/2018  ? Nausea 11/24/2018  ? Dysuria 11/23/2018  ? Chronic bilateral low back pain without sciatica 11/23/2018  ? Secondary amenorrhea 11/23/2018  ? Screening for cervical cancer 11/23/2018  ? Symptomatic anemia 10/08/2017  ? Indication for care in labor or delivery 09/17/2017  ? SVD (spontaneous vaginal delivery) 09/17/2017  ? Uterine size date discrepancy 09/13/2017  ? Poor weight gain of pregnancy, unspecified trimester 04/17/2017  ? Iron deficiency anemia during pregnancy 04/17/2017  ? Supervision of high risk pregnancy, antepartum 03/05/2017  ? History of gestational hypertension 03/05/2017  ? Lower abdominal pain 12/14/2014  ? Encounter for contraceptive management 11/23/2014  ? Environmental and seasonal allergies 10/18/2014  ? 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 History of poor fetal growth 06/04/2014  ? Elevated DSR   ? ? ?Past Surgical History:  ?Procedure Laterality Date  ?  DILATION AND EVACUATION N/A 08/10/2014  ? Procedure: DILATATION AND EVACUATION;  Surgeon: Lavonia Drafts, MD;  Location: [facility];  Service: Gynecology;  Laterality: N/A;  ? DILATION AND EVACUATION N/A 10/08/2017  ? Procedure: DILATATION AND EVACUATION;  Surgeon: Mora Bellman, MD;  Location: [facility];  Service: Gynecology;  Laterality: N/A;  ? HYSTEROSCOPY N/A 08/10/2014  ? Procedure: HYSTEROSCOPY;  Surgeon: Lavonia Drafts, MD;  Location: [facility];  Service: Gynecology;  Laterality: N/A;  ? ? ?OB History   ? ? Gravida  ?2  ? Para  ?2  ? Term  ?2  ? Preterm  ?   ? AB  ?   ? Living  ?2  ?  ? ? SAB  ?   ? IAB  ?   ? Ectopic  ?   ? Multiple  ?0  ? Live Births  ?2  ?   ?  ?  ? ? ? ?Home Medications   ? ?Prior to Admission medications   ?Medication Sig Start Date End Date Taking? Authorizing Provider  ?erythromycin ophthalmic ointment Place a 1/2 inch ribbon of ointment into the lower eyelid 4 times daily for 7 days. 10/12/21  Yes , Michele Rockers, FNP  ?ibuprofen (ADVIL) 800 MG tablet Take 1 tablet (800 mg total) by mouth every 8 (eight) hours as needed (pain). 09/12/21   Barrett Henle, MD  ? ? ?Family History ?Family History  ?Problem Relation Age of Onset  ? Alcohol abuse Neg Hx   ? ? ?Social History ?Social  History  ? ?Tobacco Use  ? Smoking status: Never  ? Smokeless tobacco: Never  ?Vaping Use  ? Vaping Use: Never used  ?Substance Use Topics  ? Alcohol use: No  ? Drug use: No  ? ? ? ?Allergies   ?Okra, Ondansetron, Phenergan [promethazine], and Zofran [ondansetron hcl] ? ? ?Review of Systems ?Review of Systems ?Per HPI ? ?Physical Exam ?Triage Vital Signs ?ED Triage Vitals  ?Enc Vitals Group  ?   BP 10/12/21 1956 126/85  ?   Pulse Rate 10/12/21 1956 90  ?   Resp 10/12/21 1956 16  ?   Temp 10/12/21 1956 98.1 ?F (36.7 ?C)  ?   Temp Source 10/12/21 1956 Oral  ?   SpO2 10/12/21 1956 98 %  ?   Weight 10/12/21 1954 103 lb 9.9 oz (47 kg)  ?   Height 10/12/21 1954 '4\' 10"'$  (1.473 m)  ?   Head Circumference --   ?    Peak Flow --   ?   Pain Score 10/12/21 1954 7  ?   Pain Loc --   ?   Pain Edu? --   ?   Excl. in [place]? --   ? ?No data found. ? ?Updated Vital Signs ?BP 126/85 (BP Location: Right Arm)   Pulse 90   Temp 98.1 ?F (36.7 ?C) (Oral)   Resp 16   Ht '4\' 10"'$  (1.473 m)   Wt 103 lb 9.9 oz (47 kg)   SpO2 98%   BMI 21.66 kg/m?  ? ?Visual Acuity ?Right Eye Distance: 20/30 ?Left Eye Distance: 20/40 ?Bilateral Distance: 20/25 ? ?Right Eye Near:   ?Left Eye Near:    ?Bilateral Near:    ? ?Physical Exam ?Constitutional:   ?   General: She is not in acute distress. ?   Appearance: Normal appearance. She is not toxic-appearing or diaphoretic.  ?HENT:  ?   Head: Normocephalic and atraumatic.  ?Eyes:  ?   General: Lids are normal. Vision grossly intact. Gaze aligned appropriately.  ?   Extraocular Movements: Extraocular movements intact.  ?   Conjunctiva/sclera:  ?   Right eye: Right conjunctiva is injected. No chemosis, exudate or hemorrhage. ?   Left eye: Left conjunctiva is not injected. No chemosis, exudate or hemorrhage. ?   Pupils: Pupils are equal, round, and reactive to light.  ?   Comments: No obvious foreign body on initial exam.  Fluorescein stain completed that showed mobile foreign body of unknown material in right eye.  Eye was then flushed out with removal of foreign body.  No obvious corneal abrasion noted afterwards.  No dendritic lesions present.  ?Pulmonary:  ?   Effort: Pulmonary effort is normal.  ?Neurological:  ?   General: No focal deficit present.  ?   Mental Status: She is alert and oriented to person, place, and time. Mental status is at baseline.  ?Psychiatric:     ?   Mood and Affect: Mood normal.     ?   Behavior: Behavior normal.     ?   Thought Content: Thought content normal.     ?   Judgment: Judgment normal.  ? ? ? ?UC Treatments / Results  ?Labs ?(all labs ordered are listed, but only abnormal results are displayed) ?Labs Reviewed - No data to display ? ?EKG ? ? ?Radiology ?No results  found. ? ?Procedures ?Procedures (including critical care time) ? ?Medications Ordered in UC ?Medications - No data to display ? ?Initial Impression /  Assessment and Plan / UC Course  ?I have reviewed the triage vital signs and the nursing notes. ? ?Pertinent labs & imaging results that were available during my care of the patient were reviewed by me and considered in my medical decision making (see chart for details). ? ?  ? ?Foreign body was removed.  Suspect this is the cause of patient's corneal irritation.  Although, there is no corneal abrasion that was obvious on exam.  Will still treat with erythromycin ointment to cover for infection of the eye.  Visual acuity appears fairly normal on exam.  Advised patient to follow-up with provided contact for patient for ophthalmology for further evaluation and management.  Discussed return precautions.  Patient verbalized understanding and was agreeable with plan. ?Final Clinical Impressions(s) / UC Diagnoses  ? ?Final diagnoses:  ?Foreign body of right eye, initial encounter  ?Corneal irritation of right eye  ? ? ? ?Discharge Instructions   ? ?  ?It appears that you have something in your eye that was washed out.  You have been prescribed antibiotic ointment to take to help prevent infection.  Please follow-up with provided contact information for ophthalmologist on Monday if symptoms persist or worsen. ? ? ? ?ED Prescriptions   ? ? Medication Sig Dispense Auth. Provider  ? erythromycin ophthalmic ointment Place a 1/2 inch ribbon of ointment into the lower eyelid 4 times daily for 7 days. 3.5 g Teodora Medici, [place]  ? ?  ? ?PDMP not reviewed this encounter. ?  ?Teodora Medici, [place] ?10/12/21 2037 ? ?

## 2021-10-12 NOTE — ED Triage Notes (Signed)
Pt reports right eye pain since this morning. Pt states she woke up with eye red, itchy and swollen.  ?

## 2021-11-14 DIAGNOSIS — A084 Viral intestinal infection, unspecified: Secondary | ICD-10-CM | POA: Diagnosis not present

## 2021-11-14 DIAGNOSIS — Z20822 Contact with and (suspected) exposure to covid-19: Secondary | ICD-10-CM | POA: Diagnosis not present

## 2021-11-14 DIAGNOSIS — Z3202 Encounter for pregnancy test, result negative: Secondary | ICD-10-CM | POA: Diagnosis not present

## 2022-03-27 ENCOUNTER — Encounter: Payer: Self-pay | Admitting: Internal Medicine

## 2022-04-12 ENCOUNTER — Ambulatory Visit: Payer: Medicaid Other | Admitting: Family Medicine

## 2022-04-17 ENCOUNTER — Other Ambulatory Visit: Payer: Self-pay

## 2022-04-17 ENCOUNTER — Ambulatory Visit (INDEPENDENT_AMBULATORY_CARE_PROVIDER_SITE_OTHER): Payer: Medicaid Other | Admitting: Obstetrics and Gynecology

## 2022-04-17 ENCOUNTER — Other Ambulatory Visit (HOSPITAL_COMMUNITY)
Admission: RE | Admit: 2022-04-17 | Discharge: 2022-04-17 | Disposition: A | Discharge status: Home / Self Care | Payer: Medicaid Other | Source: Ambulatory Visit | Attending: Family Medicine | Admitting: Family Medicine

## 2022-04-17 VITALS — BP 125/88 | HR 83 | Ht <= 58 in | Wt 105.0 lb

## 2022-04-17 DIAGNOSIS — N911 Secondary amenorrhea: Secondary | ICD-10-CM | POA: Diagnosis not present

## 2022-04-17 DIAGNOSIS — Z01419 Encounter for gynecological examination (general) (routine) without abnormal findings: Secondary | ICD-10-CM | POA: Insufficient documentation

## 2022-04-17 DIAGNOSIS — R1030 Lower abdominal pain, unspecified: Secondary | ICD-10-CM

## 2022-04-17 NOTE — Progress Notes (Signed)
Pt reports pain for a long while, was told prev that she has a cyst.

## 2022-04-17 NOTE — Progress Notes (Signed)
31 yo G2P2 GTN 7 years ago, 8 months of chemo 2016   Had heavy bleeding 3 weeks after delivery  Amenorrhea since then  3 years of abdominal, once a month Dull pain , constant  Tylrnol and ibuprofen work No n/v , no dysuria Back pain after interourse the day after    Subjective:    Patient ID: Shelly Koch, female    DOB: 02-22-1991, 31 y.o.   MRN: [id number]  Gynecologic Exam    31 yo G2P2 seen for discussion of amenorrhea and abdominal pain.  Review of chart and patient's hx showed history of postpartum hemorrhage, D and C x 2 and treatment of gestational trophoblastic neoplasm.  Pt was worked up previously for amenorrhea.  She states she had heavy bleeding 3 weeks after her last pregnancy and has never had a menses again.  Hormone panel was previously normal.  She did not respond to OCP or provera challenge.  Op notes for D and C were reviewed which were suspicious for retained POC.  There was concern for intrauterine adhesions.  Pt was sent to [facility] for further evaluation and treatment of this condition.  No records of these treatment were available for review.  Pt did not want to go back to them due to cost considerations.    Pt also noted 3 years of abdominal pain that usually occurs once a month.  Pt notes a dull constant pain when it is present.  Tylenol and ibuprofen have been effective in treating the pain.  She denies nausea and dysuria.  The patient did note occasional back pain after intercourse, but not during intercourse.  Review of Systems     Objective:   Physical Exam Constitutional:      Appearance: Normal appearance. She is normal weight.  HENT:     Head: Normocephalic and atraumatic.  Cardiovascular:     Rate and Rhythm: Normal rate and regular rhythm.     Heart sounds: Normal heart sounds.  Pulmonary:     Effort: Pulmonary effort is normal.     Breath sounds: Normal breath sounds.  Abdominal:     General: Abdomen is flat. There is no distension.      Palpations: Abdomen is soft. There is no mass.     Tenderness: There is abdominal tenderness.     Comments: Abdomen is diffusely tender in bilateral lower quadrants  Genitourinary:    Comments: SSE:  cervix WNL.  SVE: no CMT, no adnexal masses, no abnl discharge  Neurological:     Mental Status: She is alert.    Vitals:   04/17/22 0852 04/17/22 0855  BP: (!) 127/100 125/88  Pulse: 82 83         Assessment & Plan:   1. Well female exam with routine gynecological exam  - Cytology - PAP( [facility])  2. Secondary amenorrhea This appears to be a long acting problem with normal labs. Will request operative and procedure reports from [facility] to see if anything was found before considering new treatment  3. Lower abdominal pain Pain is very nonspecific.  Unsure if it has gyn etiology Will check pelvic ultrasound for any new findings. Pt may benefit from consult with [facility]/pelvic pain specialist  - US PELVIC COMPLETE WITH TRANSVAGINAL; Future  F/u in 5-6 weeks  Griffin Basil, MD Faculty Attending, Center for [facility]

## 2022-04-17 NOTE — Progress Notes (Signed)
Called Pt to [employer] [place] ON 04/22/22@ 1:30, no answer, left VM.

## 2022-04-22 ENCOUNTER — Ambulatory Visit (HOSPITAL_COMMUNITY)
Admission: RE | Admit: 2022-04-22 | Discharge: 2022-04-22 | Disposition: A | Discharge status: Home / Self Care | Payer: Medicaid Other | Source: Ambulatory Visit | Attending: Obstetrics and Gynecology | Admitting: Obstetrics and Gynecology

## 2022-04-22 DIAGNOSIS — R1030 Lower abdominal pain, unspecified: Secondary | ICD-10-CM | POA: Insufficient documentation

## 2022-04-22 DIAGNOSIS — R102 Pelvic and perineal pain: Secondary | ICD-10-CM | POA: Diagnosis not present

## 2022-04-22 LAB — CYTOLOGY - PAP
Chlamydia: NEGATIVE
Comment: NEGATIVE
Comment: NEGATIVE
Comment: NEGATIVE
Comment: NORMAL
Diagnosis: NEGATIVE
High risk HPV: NEGATIVE
Neisseria Gonorrhea: NEGATIVE
Trichomonas: NEGATIVE

## 2022-04-23 ENCOUNTER — Telehealth: Payer: Self-pay | Admitting: General Practice

## 2022-04-23 NOTE — Telephone Encounter (Signed)
Patient called and left message on nurse voicemail line requesting a call back.   Called patient and she states she had an ultrasound yesterday and thinks her period might be starting and isn't sure if it could be from the ultrasound or not. Told patient if she is having spots that is likely from the ultrasound but if she is bleeding heavy like a period then it sounds like her period has started. Patient verbalized understanding and asked if she needed to do anything or take anything if her period has started. Told patient she does not and we would plan on following up with her in November. Patient verbalized understanding.

## 2022-04-30 ENCOUNTER — Encounter: Payer: Self-pay | Admitting: Internal Medicine

## 2022-05-20 ENCOUNTER — Encounter: Payer: Self-pay | Admitting: *Deleted

## 2022-06-07 ENCOUNTER — Ambulatory Visit (INDEPENDENT_AMBULATORY_CARE_PROVIDER_SITE_OTHER): Payer: Medicaid Other | Admitting: Obstetrics and Gynecology

## 2022-06-07 ENCOUNTER — Encounter: Payer: Self-pay | Admitting: Obstetrics and Gynecology

## 2022-06-07 ENCOUNTER — Other Ambulatory Visit: Payer: Self-pay

## 2022-06-07 VITALS — BP 115/83 | HR 96 | Ht <= 58 in | Wt 107.6 lb

## 2022-06-07 DIAGNOSIS — N911 Secondary amenorrhea: Secondary | ICD-10-CM

## 2022-06-07 DIAGNOSIS — N856 Intrauterine synechiae: Secondary | ICD-10-CM | POA: Diagnosis not present

## 2022-06-07 NOTE — Progress Notes (Signed)
  CC: ultrasound follow up, amenorrhea Subjective:    Patient ID: Shelly Koch, female    DOB: 1990-10-14, 31 y.o.   MRN: [id number]  HPI Pt seen for u/s follow up and discussion of amenorrhea.  Records received from [facility] showing extensive workup and treatment this past summer.  Pt had hysteroscopy along with HSG x 2.  Of note , s/p the HSG, pt has had 2  spontaneous menses 10/3 and 11/4 both lasting 4-5 days.  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  HSG post procedure showed recurrence of spotting and blockage of the left fallopian tube.Pt otherwise has been doing well, but was unsure what to do next since she was paying out of pocket for the [facility] services.   Review of Systems     Objective:   Physical Exam Vitals:   06/07/22 0846  BP: 115/83  Pulse: 96         Assessment & Plan:   1. Secondary amenorrhea At least at this point has resolved.  Pt has had menses x 2 recently.  She was advised to let Fertility medicine be aware of return of menses.  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 Pt made away that due to resumption of menses and patent right tube that she may be able to get pregnant.  There would be increased risk of ectopic pregnancy.  Discussed with patient MD unsure what would happen if egg was fertilized regarding implantation.  Pt again advised to speak with fertility specialist in consultation regarding potential risk of abnormal placentation due to this level of uterine scarring.  I spent 30 minutes dedicated to the care of this patient including previsit review of records, face to face time with the patient discussing operative findings and treatment                   and post visit testing.     Griffin Basil, MD Faculty Attending, Center for [facility]

## 2022-06-07 NOTE — Progress Notes (Signed)
Pt reports that she had a period on 04/23/22 & 05/25/22, has not had a cycle in 4 yrs prior.

## 2022-06-25 ENCOUNTER — Encounter (HOSPITAL_COMMUNITY): Payer: Self-pay

## 2022-06-25 ENCOUNTER — Ambulatory Visit (HOSPITAL_COMMUNITY)
Admission: EM | Admit: 2022-06-25 | Discharge: 2022-06-25 | Disposition: A | Discharge status: Home / Self Care | Payer: Medicaid Other | Attending: Emergency Medicine | Admitting: Emergency Medicine

## 2022-06-25 ENCOUNTER — Encounter: Payer: Self-pay | Admitting: Obstetrics and Gynecology

## 2022-06-25 DIAGNOSIS — Z1152 Encounter for screening for COVID-19: Secondary | ICD-10-CM | POA: Insufficient documentation

## 2022-06-25 DIAGNOSIS — B349 Viral infection, unspecified: Secondary | ICD-10-CM | POA: Insufficient documentation

## 2022-06-25 LAB — RESP PANEL BY RT-PCR (FLU A&B, COVID) ARPGX2
Influenza A by PCR: NEGATIVE
Influenza B by PCR: NEGATIVE
SARS Coronavirus 2 by RT PCR: NEGATIVE

## 2022-06-25 LAB — POCT RAPID STREP A, ED / UC: Streptococcus, Group A Screen (Direct): NEGATIVE

## 2022-06-25 LAB — POC INFLUENZA A AND B ANTIGEN (URGENT CARE ONLY)
INFLUENZA A ANTIGEN, POC: NEGATIVE
INFLUENZA B ANTIGEN, POC: NEGATIVE

## 2022-06-25 NOTE — ED Provider Notes (Signed)
[facility]    CSN: [id number] Arrival date & time: 06/25/22  1805      History   Chief Complaint Chief Complaint  Patient presents with   Sore Throat   Generalized Body Aches    HPI Shelly Koch is a 31 y.o. female.  Patient presents complaining of sore throat and generalized body aches for the past 3 days.  Patient reports painful swallowing.  Patient reports a subjective fever.  Patient states last week on Thursday she had some sore throat that occurred and she took some leftover antibiotic that she had, her throat improved and then these symptoms occurred 3 days ago.  Patient reports a history of strep throat.  Patient denies any recent exposure to a viral illness or any other illness that she is aware of. Patient has taken ibuprofen with minimal relief of symptoms.    Sore Throat Pertinent negatives include no chest pain and no shortness of breath.    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   Eustachian salpingitis, acute, bilateral 05/29/2020   Pressure sensation in both ears 05/29/2020   Decreased hearing of both ears 05/29/2020   Concern about eye disease without diagnosis 11/01/2019   Pelvic pain 11/24/2018   Nausea 11/24/2018   Dysuria 11/23/2018   Chronic bilateral low back pain without sciatica 11/23/2018   Secondary amenorrhea 11/23/2018   Screening for cervical cancer 11/23/2018   Symptomatic anemia 10/08/2017   Indication for care in labor or delivery 09/17/2017   SVD (spontaneous vaginal delivery) 09/17/2017   Uterine size date discrepancy 09/13/2017   Poor weight gain of pregnancy, unspecified trimester 04/17/2017   Iron deficiency anemia during pregnancy 04/17/2017   Supervision of high risk pregnancy,  antepartum 03/05/2017   History of gestational hypertension 03/05/2017   Lower abdominal pain 12/14/2014   Encounter for contraceptive management 11/23/2014   Environmental and seasonal allergies 10/18/2014   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   History of poor fetal growth 06/04/2014   Elevated DSR     Past Surgical History:  Procedure Laterality Date   DILATION AND EVACUATION N/A 08/10/2014   Procedure: DILATATION AND EVACUATION;  Surgeon: Lavonia Drafts, MD;  Location: [facility];  Service: Gynecology;  Laterality: N/A;   DILATION AND EVACUATION N/A 10/08/2017   Procedure: DILATATION AND EVACUATION;  Surgeon: Mora Bellman, MD;  Location: [facility];  Service: Gynecology;  Laterality: N/A;   HYSTEROSCOPY N/A 08/10/2014   Procedure: HYSTEROSCOPY;  Surgeon: Lavonia Drafts, MD;  Location: [facility];  Service: Gynecology;  Laterality: N/A;    OB History     Gravida  2   Para  2   Term  2   Preterm      AB      Living  2      SAB      IAB      Ectopic      Multiple  0   Live Births  2            Home Medications    Prior to Admission medications   Medication Sig Start Date End Date Taking? Authorizing Provider  erythromycin ophthalmic ointment Place a 1/2 inch ribbon of ointment into the lower eyelid 4 times daily for  7 days. 10/12/21   Teodora Medici, FNP  ibuprofen (ADVIL) 800 MG tablet Take 1 tablet (800 mg total) by mouth every 8 (eight) hours as needed (pain). 09/12/21   Barrett Henle, MD    Family History Family History  Problem Relation Age of Onset   Alcohol abuse Neg Hx     Social History Social History   Tobacco Use   Smoking status: Never   Smokeless tobacco: Never  Vaping Use   Vaping Use: Never used  Substance Use Topics   Alcohol use: No   Drug use: No     Allergies   Okra, Ondansetron, Phenergan [promethazine], and Zofran [ondansetron hcl]   Review of Systems Review of Systems  Constitutional:   Positive for chills, fatigue and fever. Negative for activity change and appetite change.  HENT:  Positive for rhinorrhea and sore throat. Negative for congestion, ear discharge, ear pain, postnasal drip, sinus pressure, sinus pain, sneezing and trouble swallowing.   Eyes: Negative.   Respiratory:  Negative for cough, chest tightness, shortness of breath and wheezing.   Cardiovascular:  Negative for chest pain and palpitations.  Gastrointestinal: Negative.   Musculoskeletal:  Positive for myalgias.     Physical Exam Triage Vital Signs ED Triage Vitals  Enc Vitals Group     BP 06/25/22 2021 (!) 122/90     Pulse Rate 06/25/22 2021 94     Resp 06/25/22 2021 16     Temp 06/25/22 2021 98.1 F (36.7 C)     Temp Source 06/25/22 2021 Oral     SpO2 06/25/22 2021 100 %     Weight --      Height --      Head Circumference --      Peak Flow --      Pain Score 06/25/22 2020 9     Pain Loc --      Pain Edu? --      Excl. in [place]? --    No data found.  Updated Vital Signs BP (!) 122/90 (BP Location: Left Arm)   Pulse 94   Temp 98.1 F (36.7 C) (Oral)   Resp 16   LMP 06/17/2022 (Approximate)   SpO2 100%     Physical Exam Vitals and nursing note reviewed.  Constitutional:      Appearance: She is well-developed.  HENT:     Right Ear: Hearing, tympanic membrane, ear canal and external ear normal.     Left Ear: Hearing, tympanic membrane, ear canal and external ear normal.     Nose: Rhinorrhea present. No congestion. Rhinorrhea is clear.     Right Turbinates: Not enlarged, swollen or pale.     Left Turbinates: Not enlarged, swollen or pale.     Right Sinus: No maxillary sinus tenderness or frontal sinus tenderness.     Left Sinus: No maxillary sinus tenderness or frontal sinus tenderness.     Mouth/Throat:     Mouth: Mucous membranes are moist.     Pharynx: Posterior oropharyngeal erythema present. No pharyngeal swelling, oropharyngeal exudate or uvula swelling.     Tonsils: No  tonsillar exudate or tonsillar abscesses. 0 on the right. 0 on the left.  Cardiovascular:     Rate and Rhythm: Normal rate and regular rhythm.     Heart sounds: Normal heart sounds, S1 normal and S2 normal.  Pulmonary:     Effort: Pulmonary effort is normal.     Breath sounds: Normal breath sounds and air entry. No  decreased breath sounds, wheezing, rhonchi or rales.  Lymphadenopathy:     Head:     Right side of head: No submandibular or tonsillar adenopathy.     Left side of head: No submandibular or tonsillar adenopathy.     Cervical: No cervical adenopathy.  Neurological:     Mental Status: She is alert.      UC Treatments / Results  Labs (all labs ordered are listed, but only abnormal results are displayed) Labs Reviewed  CULTURE, GROUP A STREP ([place])  RESP PANEL BY RT-PCR (FLU A&B, COVID) ARPGX2  POCT RAPID STREP A, ED / UC  POC INFLUENZA A AND B ANTIGEN (URGENT CARE ONLY)    EKG   Radiology No results found.  Procedures Procedures (including critical care time)  Medications Ordered in UC Medications - No data to display  Initial Impression / Assessment and Plan / UC Course  I have reviewed the triage vital signs and the nursing notes.  Pertinent labs & imaging results that were available during my care of the patient were reviewed by me and considered in my medical decision making (see chart for details).     Patient was evaluated for viral illness.  POC strep test was negative.  POC influenza test was negative.  COVID and flu test was sent and is pending, flu test was resent to verify no influenza.  Strep culture is pending. Patient was concerned for possible strep, due to negative POC test, presentation in office, and physical assessment, this writer explained to the patient that currently there is a low suspicion of strep.  Due to the patient taking a few leftover antibiotics last week, it is uncertain whether those antibiotics are masking the strep physical  presentation.  This Probation officer made patient aware to follow-up if symptoms are not improving.  Patient was made aware of symptom management of a viral illness.  Patient made aware of timeline for symptom resolution and when follow-up would be necessary.  Patient made aware of results reporting protocol and MyChart.  Patient verbalized understanding of instructions.    Charting was provided using a a verbal dictation system, charting was proofread for errors, errors may occur which could change the meaning of the information charted.   Final Clinical Impressions(s) / UC Diagnoses   Final diagnoses:  Viral illness     Discharge Instructions      We will call you if any of your test results warrant a change in your plan of care.   I am treating you for a viral illness today.  Viral illnesses usually takes 7 to 10 days to resolve.  Using over-the-counter medications can help treat the symptoms.  You may rotate Tylenol and ibuprofen every 4-6 hours.  For example, take Tylenol now and then 4 to 6 hours later take ibuprofen.  You can use Tylenol for fever and moderate pain, you can take this medication every 4-6 hours, please do not take more than 3000 mg in a 24-hour day.  You can take ibuprofen every 6 hours, do not take more than 2400 mg in a 24-hour day.  I advised that you do not take ibuprofen on an empty stomach, ibuprofen can cause GI problems such as GI bleeding.  For nasal congestion, you can buy Flonase nasal spray over-the-counter, this is an intranasal steroid.  You will put 2 sprays in each nostril during application.  I suggest that you would use Flonase upon waking in the morning and in the evening.  Mucinex  is an expectorant that can be purchased over-the-counter, this can help clear congestion from your respiratory tract.  For Mucinex dosage, please refer to the back of the box for dosage instructions, this can vary depending on the brand of medication.  Chloraseptic throat spray and  lozenges can be used for sore throat.  You may use warm liquids and honey for symptom management.  Maintaining hydration status is very important, please drink at least 8 cups of water daily.  Please try to intake nutrient dense meals.     ED Prescriptions   None    PDMP not reviewed this encounter.   Flossie Dibble, NP 06/25/22 2111

## 2022-06-25 NOTE — ED Triage Notes (Signed)
Chief Complaint: sore throat. Body aches as well. Hurts to swallow.   Onset: 3 days  OTC medications tried: Yes- ibuprofen     with mild relief  Sick exposure: No  New foods or medications: No  Recent Travel: No

## 2022-06-25 NOTE — Discharge Instructions (Addendum)
We will call you if any of your test results warrant a change in your plan of care.   I am treating you for a viral illness today.  Viral illnesses usually takes 7 to 10 days to resolve.  Using over-the-counter medications can help treat the symptoms.  You may rotate Tylenol and ibuprofen every 4-6 hours.  For example, take Tylenol now and then 4 to 6 hours later take ibuprofen.  You can use Tylenol for fever and moderate pain, you can take this medication every 4-6 hours, please do not take more than 3000 mg in a 24-hour day.  You can take ibuprofen every 6 hours, do not take more than 2400 mg in a 24-hour day.  I advised that you do not take ibuprofen on an empty stomach, ibuprofen can cause GI problems such as GI bleeding.  For nasal congestion, you can buy Flonase nasal spray over-the-counter, this is an intranasal steroid.  You will put 2 sprays in each nostril during application.  I suggest that you would use Flonase upon waking in the morning and in the evening.  Mucinex is an expectorant that can be purchased over-the-counter, this can help clear congestion from your respiratory tract.  For Mucinex dosage, please refer to the back of the box for dosage instructions, this can vary depending on the brand of medication.  Chloraseptic throat spray and lozenges can be used for sore throat.  You may use warm liquids and honey for symptom management.  Maintaining hydration status is very important, please drink at least 8 cups of water daily.  Please try to intake nutrient dense meals.

## 2022-06-27 ENCOUNTER — Telehealth (HOSPITAL_COMMUNITY): Payer: Self-pay | Admitting: Emergency Medicine

## 2022-06-27 LAB — CULTURE, GROUP A STREP (THRC)

## 2022-06-27 MED ORDER — AMOXICILLIN 500 MG PO CAPS: 500.0000 mg | ORAL_CAPSULE | Freq: Two times a day (BID) | ORAL | 0 refills | Status: AC

## 2022-07-03 ENCOUNTER — Other Ambulatory Visit: Payer: Self-pay | Admitting: Obstetrics and Gynecology

## 2022-07-03 DIAGNOSIS — Z3041 Encounter for surveillance of contraceptive pills: Secondary | ICD-10-CM

## 2022-07-03 MED ORDER — NORETHINDRONE ACET-ETHINYL EST 1-20 MG-MCG PO TABS: 1.0000 | ORAL_TABLET | Freq: Every day | ORAL | 11 refills | Status: DC

## 2022-07-03 NOTE — Progress Notes (Signed)
Rx for loestrin 1/20 placed

## 2022-07-22 NOTE — L&D Delivery Note (Cosign Needed)
Labor Course: Shelly Koch is a 32 y.o. 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  She progressed with augmentation to complete and pushed ~ 30 minutes to deliver.  Cord clamping delayed by 1+ minutes then clamped by CNM and cut by pt husband.  Placenta retained > 30 minutes. Dr Charlotta Newton to room and placenta manual removed.  Manual removal of placental fragments after placenta removed, then curettage at bedside by Dr Charlotta Newton with additional placental fragments removed.  Cytotec 1000 mg given rectally.  Bleeding stabilized.   Mom and baby stable prior to transfer to postpartum. She plans on bottlefeeding. She requests IP Nexplanon for birth control.   Delivery Note At 5:25 AM a viable and healthy female was delivered via Vaginal, Spontaneous (Presentation: Right Occiput Anterior).  APGAR: 8, 9; weight 6 lb 5.9 oz (2890 g).   Placenta status: Manual removal;Pathology, Adherent.  Cord: 3 vessels with the following complications: None.    Anesthesia: Epidural Episiotomy: None Lacerations:  first degree perineal Suture Repair: 3.0 vicryl Est. Blood Loss (mL): 1743  Mom to postpartum.  Baby to Couplet care / Skin to Skin.  [name] 03/16/2023, 7:05 AM

## 2022-08-12 ENCOUNTER — Inpatient Hospital Stay (HOSPITAL_COMMUNITY)
Admission: AD | Admit: 2022-08-12 | Discharge: 2022-08-12 | Disposition: A | Discharge status: Home / Self Care | Payer: Medicaid Other | Attending: Family Medicine | Admitting: Family Medicine

## 2022-08-12 ENCOUNTER — Encounter (HOSPITAL_COMMUNITY): Payer: Self-pay | Admitting: Obstetrics and Gynecology

## 2022-08-12 ENCOUNTER — Inpatient Hospital Stay (HOSPITAL_COMMUNITY): Payer: Medicaid Other

## 2022-08-12 DIAGNOSIS — O208 Other hemorrhage in early pregnancy: Secondary | ICD-10-CM | POA: Diagnosis not present

## 2022-08-12 DIAGNOSIS — O418X10 Other specified disorders of amniotic fluid and membranes, first trimester, not applicable or unspecified: Secondary | ICD-10-CM

## 2022-08-12 DIAGNOSIS — Z3A01 Less than 8 weeks gestation of pregnancy: Secondary | ICD-10-CM | POA: Diagnosis not present

## 2022-08-12 LAB — CBC
HCT: 34 % — ABNORMAL LOW (ref 36.0–46.0)
Hemoglobin: 11.5 g/dL — ABNORMAL LOW (ref 12.0–15.0)
MCH: 31.6 pg (ref 26.0–34.0)
MCHC: 33.8 g/dL (ref 30.0–36.0)
MCV: 93.4 fL (ref 80.0–100.0)
Platelets: 171 10*3/uL (ref 150–400)
RBC: 3.64 MIL/uL — ABNORMAL LOW (ref 3.87–5.11)
RDW: 12.5 % (ref 11.5–15.5)
WBC: 7.3 10*3/uL (ref 4.0–10.5)
nRBC: 0 % (ref 0.0–0.2)

## 2022-08-12 LAB — URINALYSIS, ROUTINE W REFLEX MICROSCOPIC
Bacteria, UA: NONE SEEN
Bilirubin Urine: NEGATIVE
Glucose, UA: NEGATIVE mg/dL
Ketones, ur: NEGATIVE mg/dL
Leukocytes,Ua: NEGATIVE
Nitrite: NEGATIVE
Protein, ur: NEGATIVE mg/dL
Specific Gravity, Urine: 1.009 (ref 1.005–1.030)
pH: 7 (ref 5.0–8.0)

## 2022-08-12 LAB — WET PREP, GENITAL
Clue Cells Wet Prep HPF POC: NONE SEEN
Sperm: NONE SEEN
Trich, Wet Prep: NONE SEEN
WBC, Wet Prep HPF POC: >=10 — AB (ref <10)
Yeast Wet Prep HPF POC: NONE SEEN

## 2022-08-12 LAB — HCG, QUANTITATIVE, PREGNANCY: hCG, Beta Chain, Quant, S: 145982 m[IU]/mL — ABNORMAL HIGH (ref <5)

## 2022-08-12 LAB — HIV ANTIBODY (ROUTINE TESTING W REFLEX): HIV Screen 4th Generation wRfx: NONREACTIVE

## 2022-08-12 LAB — POCT PREGNANCY, URINE: Preg Test, Ur: POSITIVE — AB

## 2022-08-12 MED ORDER — DOXYLAMINE-PYRIDOXINE 10-10 MG PO TBEC: 2.0000 | DELAYED_RELEASE_TABLET | Freq: Every day | ORAL | 5 refills | Status: DC

## 2022-08-12 MED ORDER — FAMOTIDINE 20 MG PO TABS: 20.0000 mg | ORAL_TABLET | Freq: Two times a day (BID) | ORAL | 3 refills | Status: DC

## 2022-08-12 NOTE — MAU Note (Signed)
.  Shelly Koch is a 32 y.o. here in MAU reporting: lower abdominal pain and spotting that started x2 weeks. She states the pain feels like stretching and is mostly on her left side. She also was supposed to have a hysteroscopy of her left tube because it is blocked.  LMP: 06/16/22 Onset of complaint: x2 weeks ago Pain score: 7 Vitals:   08/12/22 0724  BP: 127/71  Pulse: 89  Resp: 16  Temp: 98.4 F (36.9 C)  SpO2: 100%   Lab orders placed from triage:  UPT, UA if pos

## 2022-08-12 NOTE — MAU Provider Note (Signed)
History     CSN: [id number]  Arrival date and time: 08/12/22 0709     Chief Complaint  Patient presents with   Vaginal Bleeding   HPI Patient is a 32 year old G3 P2-0-0-2 at 8 weeks and 1 day.  She has a history of Asherman syndrome.  She has had a hysteroscopy with [facility] which showed that her left tube is occluded.  She has been spotting for 3 weeks and has been having pelvic pain left greater than right for the past 3 days. No palliating or provoking factors.  No increasing in bleeding.  OB History     Gravida  3   Para  2   Term  2   Preterm      AB      Living  2      SAB      IAB      Ectopic      Multiple  0   Live Births  2           Past Medical History:  Diagnosis Date   Anemia    Gestational trophoblastic neoplasm    PICC (peripherally inserted central catheter) in place 12/24/2014   Preterm labor    Pyelonephritis     Past Surgical History:  Procedure Laterality Date   DILATION AND EVACUATION N/A 08/10/2014   Procedure: DILATATION AND EVACUATION;  Surgeon: Lavonia Drafts, MD;  Location: [facility];  Service: Gynecology;  Laterality: N/A;   DILATION AND EVACUATION N/A 10/08/2017   Procedure: DILATATION AND EVACUATION;  Surgeon: Mora Bellman, MD;  Location: [facility];  Service: Gynecology;  Laterality: N/A;   HYSTEROSCOPY N/A 08/10/2014   Procedure: HYSTEROSCOPY;  Surgeon: Lavonia Drafts, MD;  Location: [facility];  Service: Gynecology;  Laterality: N/A;    Family History  Problem Relation Age of Onset   Alcohol abuse Neg Hx     Social History   Tobacco Use   Smoking status: Never   Smokeless tobacco: Never  Vaping Use   Vaping Use: Never used  Substance Use Topics   Alcohol use: No   Drug use: No    Allergies:  Allergies  Allergen Reactions   Okra     Hives and itching   Ondansetron Hives and Itching   Phenergan [Promethazine] Hives and Itching    Note: pt tolerates PO COMPAZINE   Zofran [Ondansetron Hcl] Rash     Medications Prior to Admission  Medication Sig Dispense Refill Last Dose   acetaminophen (TYLENOL) 500 MG tablet Take 500 mg by mouth every 6 (six) hours as needed.   08/11/2022 at 2300   erythromycin ophthalmic ointment Place a 1/2 inch ribbon of ointment into the lower eyelid 4 times daily for 7 days. 3.5 g 0    ibuprofen (ADVIL) 800 MG tablet Take 1 tablet (800 mg total) by mouth every 8 (eight) hours as needed (pain). 21 tablet 0    norethindrone-ethinyl estradiol (LOESTRIN 1/20, 21,) 1-20 MG-MCG tablet Take 1 tablet by mouth daily. 28 tablet 11     Review of Systems Physical Exam   Blood pressure 127/71, pulse 89, temperature 98.4 F (36.9 C), temperature source Oral, resp. rate 16, height '4\' 10"'$  (1.473 m), weight 49.1 kg, last menstrual period 06/16/2022, SpO2 100 %.  Physical Exam Vitals and nursing note reviewed.  Constitutional:      Appearance: Normal appearance.  Cardiovascular:     Rate and Rhythm: Normal rate.  Pulmonary:     Effort: Pulmonary effort is  normal.  Abdominal:     General: Abdomen is flat. There is no distension.     Palpations: There is no mass.     Tenderness: There is abdominal tenderness (LLQ pain > RLQ pain). There is no guarding or rebound.  Skin:    Capillary Refill: Capillary refill takes less than 2 seconds.  Neurological:     General: No focal deficit present.     Mental Status: She is alert.  Psychiatric:        Mood and Affect: Mood normal.        Behavior: Behavior normal.        Thought Content: Thought content normal.        Judgment: Judgment normal.   Results for orders placed or performed during the hospital encounter of 08/12/22 (from the past 24 hour(s))  Pregnancy, urine POC     Status: Abnormal   Collection Time: 08/12/22  7:26 AM  Result Value Ref Range   Preg Test, Ur POSITIVE (A) NEGATIVE  Urinalysis, Routine w reflex microscopic Urine, Clean Catch     Status: Abnormal   Collection Time: 08/12/22  7:33 AM  Result  Value Ref Range   Color, Urine STRAW (A) YELLOW   APPearance CLEAR CLEAR   Specific Gravity, Urine 1.009 1.005 - 1.030   pH 7.0 5.0 - 8.0   Glucose, UA NEGATIVE NEGATIVE mg/dL   Hgb urine dipstick SMALL (A) NEGATIVE   Bilirubin Urine NEGATIVE NEGATIVE   Ketones, ur NEGATIVE NEGATIVE mg/dL   Protein, ur NEGATIVE NEGATIVE mg/dL   Nitrite NEGATIVE NEGATIVE   Leukocytes,Ua NEGATIVE NEGATIVE   RBC / HPF 0-5 0 - 5 RBC/hpf   WBC, UA 0-5 0 - 5 WBC/hpf   Bacteria, UA NONE SEEN NONE SEEN   Squamous Epithelial / HPF 0-5 0 - 5 /HPF  hCG, quantitative, pregnancy     Status: Abnormal   Collection Time: 08/12/22  8:20 AM  Result Value Ref Range   hCG, Beta Chain, Quant, S 145,982 (H) <5 mIU/mL  CBC     Status: Abnormal   Collection Time: 08/12/22  8:20 AM  Result Value Ref Range   WBC 7.3 4.0 - 10.5 K/uL   RBC 3.64 (L) 3.87 - 5.11 MIL/uL   Hemoglobin 11.5 (L) 12.0 - 15.0 g/dL   HCT 34.0 (L) 36.0 - 46.0 %   MCV 93.4 80.0 - 100.0 fL   MCH 31.6 26.0 - 34.0 pg   MCHC 33.8 30.0 - 36.0 g/dL   RDW 12.5 11.5 - 15.5 %   Platelets 171 150 - 400 K/uL   nRBC 0.0 0.0 - 0.2 %  Wet prep, genital     Status: Abnormal   Collection Time: 08/12/22  8:24 AM   Specimen: Vaginal  Result Value Ref Range   Yeast Wet Prep HPF POC NONE SEEN NONE SEEN   Trich, Wet Prep NONE SEEN NONE SEEN   Clue Cells Wet Prep HPF POC NONE SEEN NONE SEEN   WBC, Wet Prep HPF POC >=10 (A) <10   Sperm NONE SEEN    US OB LESS THAN 14 WEEKS WITH OB TRANSVAGINAL  Result Date: 08/12/2022 CLINICAL DATA:  Vaginal bleeding, lower abdominal pain for 2 weeks. EXAM: OBSTETRIC <14 WK [place] AND TRANSVAGINAL OB US TECHNIQUE: Both transabdominal and transvaginal ultrasound examinations were performed for complete evaluation of the gestation as well as the maternal uterus, adnexal regions, and pelvic cul-de-sac. Transvaginal technique was performed to assess early pregnancy. COMPARISON:  April 22, 2022. FINDINGS: Intrauterine gestational sac: Single  Yolk sac:  Visualized. Embryo:  Visualized. Cardiac Activity: Visualized. Heart Rate: 125 bpm CRL: 8.4 mm 6 w 5 d [place] EDC: April 01, 2022. Subchorionic hemorrhage:  Small subchorionic hemorrhage is noted. Maternal uterus/adnexae: Probable corpus luteum cyst seen in right ovary. Left ovary is unremarkable. No free fluid is noted. IMPRESSION: Single live intrauterine gestation of 6 weeks 5 days. Small subchorionic hemorrhage is noted. Electronically Signed   By: Marijo Conception M.D.   On: 08/12/2022 09:15    MAU Course  Procedures  MDM  Assessment and Plan   1. [redacted] weeks gestation of pregnancy   2. Subchorionic hematoma in first trimester, single or unspecified fetus    Results given to patient.  Pelvic rest.  Return precautions given.  Follow-up with OB.  Truett Mainland 08/12/2022, 8:18 AM

## 2022-08-13 LAB — GC/CHLAMYDIA PROBE AMP (~~LOC~~) NOT AT ARMC
Chlamydia: NEGATIVE
Comment: NEGATIVE
Comment: NORMAL
Neisseria Gonorrhea: NEGATIVE

## 2022-08-23 ENCOUNTER — Encounter (HOSPITAL_COMMUNITY): Payer: Self-pay | Admitting: Family Medicine

## 2022-08-23 ENCOUNTER — Inpatient Hospital Stay (HOSPITAL_COMMUNITY)
Admission: AD | Admit: 2022-08-23 | Discharge: 2022-08-24 | Disposition: A | Discharge status: Home / Self Care | Payer: Medicaid Other | Attending: Family Medicine | Admitting: Family Medicine

## 2022-08-23 DIAGNOSIS — Z3A08 8 weeks gestation of pregnancy: Secondary | ICD-10-CM

## 2022-08-23 DIAGNOSIS — Z3A31 31 weeks gestation of pregnancy: Secondary | ICD-10-CM | POA: Insufficient documentation

## 2022-08-23 DIAGNOSIS — O26891 Other specified pregnancy related conditions, first trimester: Secondary | ICD-10-CM

## 2022-08-23 DIAGNOSIS — O26893 Other specified pregnancy related conditions, third trimester: Secondary | ICD-10-CM | POA: Insufficient documentation

## 2022-08-23 DIAGNOSIS — O26851 Spotting complicating pregnancy, first trimester: Secondary | ICD-10-CM | POA: Insufficient documentation

## 2022-08-23 DIAGNOSIS — R109 Unspecified abdominal pain: Secondary | ICD-10-CM | POA: Insufficient documentation

## 2022-08-23 DIAGNOSIS — R102 Pelvic and perineal pain: Secondary | ICD-10-CM | POA: Insufficient documentation

## 2022-08-23 NOTE — MAU Note (Signed)
Pt says has vag spotting- when she wipes started on 07-21-2022 Cramping started yesterday- and has gotten worse 10/10- took  9pm-XS Tyl 1 tab

## 2022-08-24 ENCOUNTER — Inpatient Hospital Stay (HOSPITAL_COMMUNITY): Payer: Medicaid Other

## 2022-08-24 ENCOUNTER — Encounter (HOSPITAL_COMMUNITY): Payer: Self-pay | Admitting: Family Medicine

## 2022-08-24 DIAGNOSIS — Z3A31 31 weeks gestation of pregnancy: Secondary | ICD-10-CM | POA: Diagnosis not present

## 2022-08-24 DIAGNOSIS — R109 Unspecified abdominal pain: Secondary | ICD-10-CM | POA: Diagnosis not present

## 2022-08-24 DIAGNOSIS — Z3A08 8 weeks gestation of pregnancy: Secondary | ICD-10-CM

## 2022-08-24 DIAGNOSIS — O26891 Other specified pregnancy related conditions, first trimester: Secondary | ICD-10-CM | POA: Diagnosis not present

## 2022-08-24 DIAGNOSIS — R102 Pelvic and perineal pain: Secondary | ICD-10-CM | POA: Diagnosis not present

## 2022-08-24 DIAGNOSIS — O26851 Spotting complicating pregnancy, first trimester: Secondary | ICD-10-CM | POA: Diagnosis not present

## 2022-08-24 DIAGNOSIS — R12 Heartburn: Secondary | ICD-10-CM

## 2022-08-24 DIAGNOSIS — O26893 Other specified pregnancy related conditions, third trimester: Secondary | ICD-10-CM | POA: Diagnosis present

## 2022-08-24 LAB — URINALYSIS, ROUTINE W REFLEX MICROSCOPIC
Bilirubin Urine: NEGATIVE
Glucose, UA: NEGATIVE mg/dL
Hgb urine dipstick: NEGATIVE
Ketones, ur: NEGATIVE mg/dL
Leukocytes,Ua: NEGATIVE
Nitrite: NEGATIVE
Protein, ur: NEGATIVE mg/dL
Specific Gravity, Urine: 1.002 — ABNORMAL LOW (ref 1.005–1.030)
pH: 6 (ref 5.0–8.0)

## 2022-08-24 LAB — WET PREP, GENITAL
Clue Cells Wet Prep HPF POC: NONE SEEN
Sperm: NONE SEEN
Trich, Wet Prep: NONE SEEN
WBC, Wet Prep HPF POC: >=10 — AB (ref <10)
Yeast Wet Prep HPF POC: NONE SEEN

## 2022-08-24 MED ORDER — DICYCLOMINE HCL 10 MG PO CAPS
10.0000 mg | ORAL_CAPSULE | Freq: Once | ORAL | Status: AC
  Administered 2022-08-24: 10 mg via ORAL
  Filled 2022-08-24: qty 1

## 2022-08-24 MED ORDER — HYDROMORPHONE HCL 1 MG/ML IJ SOLN
1.0000 mg | Freq: Once | INTRAMUSCULAR | Status: AC
  Administered 2022-08-24: 1 mg via INTRAMUSCULAR
  Filled 2022-08-24: qty 1

## 2022-08-24 MED ORDER — ALUM & MAG HYDROXIDE-SIMETH 200-200-20 MG/5ML PO SUSP
30.0000 mL | Freq: Once | ORAL | Status: AC
  Administered 2022-08-24: 30 mL via ORAL
  Filled 2022-08-24: qty 30

## 2022-08-24 MED ORDER — OMEPRAZOLE 20 MG PO CPDR: 20.0000 mg | DELAYED_RELEASE_CAPSULE | Freq: Every day | ORAL | 3 refills | Status: DC

## 2022-08-24 NOTE — Discharge Instructions (Signed)

## 2022-08-24 NOTE — MAU Provider Note (Addendum)
History     CSN: [id number]  Arrival date and time: 08/23/22 2317   Event Date/Time   First Provider Initiated Contact with Patient 08/24/22 0017      Chief Complaint  Patient presents with   Vaginal Bleeding   Abdominal Pain   HPI [name] is a 32 y.o. year old G33P2002 female at 87w3dweeks gestation who presents to MAU reporting vaginal spotting with wiping since 07/21/2022. She reports cramping started yesterday and has increased in intensity. She rates the pain 10/10. She describes the pain as sharp and lower pelvis R>L. Movement makes the pain worse. [facility]; next appt is 09/27/2022. Her spouse is present and contributing to the history taking.   OB History     Gravida  3   Para  2   Term  2   Preterm      AB      Living  2      SAB      IAB      Ectopic      Multiple  0   Live Births  2           Past Medical History:  Diagnosis Date   Anemia    Gestational trophoblastic neoplasm    PICC (peripherally inserted central catheter) in place 12/24/2014   Preterm labor    Pyelonephritis     Past Surgical History:  Procedure Laterality Date   DILATION AND EVACUATION N/A 08/10/2014   Procedure: DILATATION AND EVACUATION;  Surgeon: CLavonia Drafts MD;  [facility];  Service: Gynecology;  Laterality: N/A;   DILATION AND EVACUATION N/A 10/08/2017   Procedure: DILATATION AND EVACUATION;  Surgeon: CMora Bellman MD;  [facility];  Service: Gynecology;  Laterality: N/A;   HYSTEROSCOPY N/A 08/10/2014   Procedure: HYSTEROSCOPY;  Surgeon: CLavonia Drafts MD;  [facility];  Service: Gynecology;  Laterality: N/A;    Family History  Problem Relation Age of Onset   Alcohol abuse Neg Hx     Social History   Tobacco Use   Smoking status: Never   Smokeless tobacco: Never  Vaping Use   Vaping Use: Never used  Substance Use Topics   Alcohol use: No   Drug use: No    Allergies:  Allergies  Allergen  Reactions   Okra     Hives and itching   Ondansetron Hives and Itching   Phenergan [Promethazine] Hives and Itching    Note: pt tolerates PO COMPAZINE   Zofran [Ondansetron Hcl] Rash    Medications Prior to Admission  Medication Sig Dispense Refill Last Dose   acetaminophen (TYLENOL) 500 MG tablet Take 500 mg by mouth every 6 (six) hours as needed.   08/23/2022   Doxylamine-Pyridoxine (DICLEGIS) 10-10 MG TBEC Take 2 tablets by mouth at bedtime. If symptoms persist, add one tablet in the morning and one in the afternoon 100 tablet 5 08/23/2022   famotidine (PEPCID) 20 MG tablet Take 1 tablet (20 mg total) by mouth 2 (two) times daily. 60 tablet 3 none    Review of Systems  Constitutional: Negative.   HENT: Negative.    Eyes: Negative.   Respiratory: Negative.    Cardiovascular: Negative.   Gastrointestinal: Negative.   Endocrine: Negative.   Genitourinary:  Positive for pelvic pain (cramping) and vaginal bleeding.  Musculoskeletal: Negative.   Skin: Negative.   Allergic/Immunologic: Negative.   Neurological: Negative.   Hematological: Negative.   Psychiatric/Behavioral: Negative.  Physical Exam   Blood pressure 124/76, pulse 87, temperature 98 F (36.7 C), temperature source Oral, resp. rate 18, height '4\' 10"'$  (1.473 m), weight 49.8 kg, last menstrual period 06/16/2022.  Physical Exam Vitals and nursing note reviewed.  Constitutional:      Appearance: Normal appearance.  Cardiovascular:     Rate and Rhythm: Normal rate.  Pulmonary:     Effort: Pulmonary effort is normal.  Abdominal:     Palpations: Abdomen is soft.  Neurological:     Mental Status: She is alert and oriented to person, place, and time.  Psychiatric:        Mood and Affect: Mood normal.        Behavior: Behavior normal.        Thought Content: Thought content normal.        Judgment: Judgment normal.    MAU Course  Procedures  MDM CCUA Wet Prep GC/CT -- Results pending OB Transvaginal  U/S  Results for orders placed or performed during the hospital encounter of 08/23/22 (from the past 24 hour(s))  Urinalysis, Routine w reflex microscopic -Urine, Clean Catch     Status: Abnormal   Collection Time: 08/24/22 12:01 AM  Result Value Ref Range   Color, Urine COLORLESS (A) YELLOW   APPearance CLEAR CLEAR   Specific Gravity, Urine 1.002 (L) 1.005 - 1.030   pH 6.0 5.0 - 8.0   Glucose, UA NEGATIVE NEGATIVE mg/dL   Hgb urine dipstick NEGATIVE NEGATIVE   Bilirubin Urine NEGATIVE NEGATIVE   Ketones, ur NEGATIVE NEGATIVE mg/dL   Protein, ur NEGATIVE NEGATIVE mg/dL   Nitrite NEGATIVE NEGATIVE   Leukocytes,Ua NEGATIVE NEGATIVE  Wet prep, genital     Status: Abnormal   Collection Time: 08/24/22 12:39 AM  Result Value Ref Range   Yeast Wet Prep HPF POC NONE SEEN NONE SEEN   Trich, Wet Prep NONE SEEN NONE SEEN   Clue Cells Wet Prep HPF POC NONE SEEN NONE SEEN   WBC, Wet Prep HPF POC >=10 (A) <10   Sperm NONE SEEN    US OB Comp Less 14 Wks  Result Date: 08/24/2022 CLINICAL DATA:  Vaginal bleeding during pregnancy. Spotting since 12/31. Estimated gestational age by LMP is 9 weeks 6 days. Quantitative beta HCG was not drawn. EXAM: OBSTETRIC <14 WK [place] AND TRANSVAGINAL OB US TECHNIQUE: Both transabdominal and transvaginal ultrasound examinations were performed for complete evaluation of the gestation as well as the maternal uterus, adnexal regions, and pelvic cul-de-sac. Transvaginal technique was performed to assess early pregnancy. COMPARISON:  08/12/2022 FINDINGS: Intrauterine gestational sac: A single intrauterine pregnancy is identified. Yolk sac:  Yolk sac is visualized. Embryo:  Fetal pole is present. Cardiac Activity: Fetal cardiac activity is observed. Heart Rate: 171 bpm CRL:  19.7 mm   8 w   4 d                  [place] EDC: 04/01/2023 Subchorionic hemorrhage:  None visualized. Maternal uterus/adnexae: Uterus is anteverted. No myometrial mass lesions are identified. Right ovary is  visualized and appears normal. Left ovary is not seen. No free fluid in the pelvis. IMPRESSION: Single intrauterine pregnancy. Estimated gestational age by LMP is 8 weeks 4 days, representing appropriate interval growth since prior study. No acute complication is demonstrated sonographically. Electronically Signed   By: Lucienne Capers M.D.   On: 08/24/2022 01:43     Report given to and care assumed by Gavin Pound, CNM @ [place], CNM  08/24/2022, 12:17 AM  Assessment and Plan  Reassessment (2:07 AM)  -Patient reports relief of pain. -Discussed RLP and care measures. -Informed of US findings and reassured normal. -Patient to initiate care as appropriate. -Precautions given. -Discharged to home in improved condition.  Maryann Conners MSN, CNM Advanced Practice Provider, [facility]

## 2022-08-26 LAB — GC/CHLAMYDIA PROBE AMP (~~LOC~~) NOT AT ARMC
Chlamydia: NEGATIVE
Comment: NEGATIVE
Comment: NORMAL
Neisseria Gonorrhea: NEGATIVE

## 2022-09-18 ENCOUNTER — Telehealth: Payer: Medicaid Other

## 2022-09-19 ENCOUNTER — Telehealth (INDEPENDENT_AMBULATORY_CARE_PROVIDER_SITE_OTHER): Payer: Medicaid Other

## 2022-09-19 DIAGNOSIS — O0990 Supervision of high risk pregnancy, unspecified, unspecified trimester: Secondary | ICD-10-CM | POA: Insufficient documentation

## 2022-09-19 DIAGNOSIS — Z3689 Encounter for other specified antenatal screening: Secondary | ICD-10-CM

## 2022-09-19 DIAGNOSIS — Z3480 Encounter for supervision of other normal pregnancy, unspecified trimester: Secondary | ICD-10-CM | POA: Insufficient documentation

## 2022-09-19 MED ORDER — BLOOD PRESSURE MONITORING DEVI: 1.0000 | 0 refills | Status: DC

## 2022-09-19 NOTE — Patient Instructions (Addendum)
[place] [phone]) [facility] Owens Shark, MD; Erin Hearing, MD; Gwendlyn Deutscher, MD; Andria Frames, MD; McDiarmid, MD; Dutch Quint, [address] [place], [place] [address] [phone] Mon-Fri 8:30-12:30, 1:30-5:00  Providers come to see babies during newborn hospitalization Only accepting infants of Mother's who are seen at [facility] or have siblings seen at   [place] Medicaid - Yes; [facility], MD [address] [place], [place] [address] [phone] Mon, Tue, Thur, Fri 8:30-5:00, Wed 10:00-7:00 (closed 1-2pm daily for lunch) [facility] residents with no insurance.  [place] only with Medicaid/insurance; Tricare - no  [facility] for [facility]) - [facility], MD; Owens Shark, MD; Tamera Punt, MD; Doneen Poisson, MD; Fatima Sanger, MD; Lindwood Qua, MD; Thornell Sartorius, MD; Ronnald Ramp,  MD; Wynetta Emery, MD; Jess Barters, MD; Tami Ribas, MD; Derrell Lolling, MD; Dorothyann Peng, MD; Lucious Groves, NP [address], [place], [place] [address] [phone] Mon, Tue, Thur, Fri 8:30-5:30, Wed 9:30-5:30, Sat 8:30-12:30 Only accepting infants of first-time parents or siblings of current patients Hospital discharge coordinator will make follow-up appointment Medicaid - yes; Tricare - yes  [place] [phone]) [facility] Garnette Czech, MD; Rosana Hoes, MD; Servando Salina, MD; Rose Fillers, MD; Corinna Capra, MD; [facility], MD; Javier Glazier, MD; Janann Colonel, MD; Jimmye Norman, [place], [place], [place] [address] [phone] Mon-Fri 8:30-5:00, closed for lunch 12:30-1:30; Sat-Sun 10:00-1:00 Accepting Newborns with commercial insurance only, must call prior to delivery to be accepted into  practice.  Medicaid - no, Tricare - yes   [place] Start, [place] [address] [phone] or [phone] Mon to Fri 8am to 10pm, Sat 8am to 1pm  (virtual only on weekends) Only accepts Medicaid Healthy Blue pts  [facility] ([facility]) - Pediatrics at Rogelia Boga, MD; Vilma Prader, MD; Vanita Panda, MD; Roxanne Mins, NP; Nestor Lewandowsky, MD; Ronney Lion, MD [place]., [place], [place] [address] [phone] Mon-Fri 8:30-5:30 Medicaid - yes, Tricare - yes  [place] [phone]) [facility] Burn[facility]ette Pop, MD [address], [place], [place] [address] [phone] Sammuel Cooper, Wed Fri 8:30-5:00, Sat 8:30-12:00, Closed Thursdays Accepting siblings of established patients and first time mom's if you call prenatally Medicaid- yes; Tricare - yes  [place] at Arlina Robes, [place]; Mannie Stabile, MD; Jerald Kief; [place], [place]; Nancy Fetter, MD; Moreen Fowler, MD;  [address], [place], [place] [address] [phone] Mon-Fri 8:30-5:00, closed for lunch 1-2 Only accepting newborns of established patients Medicaid- no; Tricare - yes  [facility] [phone]) [place] at Leane Para, MD; [place], [place], [place] [address] [phone] Mon-Fri 8:00-5:00 Medicaid - No; Tricare - Yes  [place] at [facility], [place]; [place], [place], [place], [place] [address] [phone] Mon-Fri 8:00-5:00 Medicaid - No, Tricare - Yes  [facility] Abner Greenspan, MD; Sheran Lawless, MD; [place], [place] [address] [place] [address] [place], [place] [address] [phone]  Mon-Fri 8:00-5:00 Medicaid - No; Tricare - Yes  [address] [place], [place] [address] [phone] Mon-Fri 8:30-5:00 (lunch 12:00-1:00) Medicaid -Yes; [place] at [place], MD [place], [place], [place] [address] [phone] Mon-Fri 8:00-5:00 Seeing newborns of current patients only. No new patients Medicaid - No, Tricare - yes  Therapist, music at [place], [place]  [place]., [place],  [address] [phone] Mon-Fri 8:00-5:00 Medicaid -yes as secondary coverage only;  Tricare - yes  [place], [place]; [place], [place]; Albertina Parr, MD; Frederic Jericho, MD; Ronney Lion, MD; [place], [place]; Smoot, NP; Corinna Lines, MD; [place], [address]., [place], [place] [address] [phone] Mon-Fri 8:30-5:00, Sat 9:00-11:00 Accepts commercial insurance ONLY. Offers free prenatal information sessions for families. Medicaid - No, Tricare - Call first  [place], MD; [place], [place]; [place], [place]; [place], [address] [place] [address] [phone] Mon-Fri 7:30-5:30 Medicaid - Yes; Marchelle Gearing yes  [place] [phone] & [phone])  [facility], [place]., [place], [place] [address] [phone] Mon-Thur 8:00-6:00, closed for lunch 12-2, closed Fridays Medicaid - yes; Tricare - no  [place], NP; Melford Aase, MD; [place], [place]; [place], [place] [address] [place], [place], [place] [address] [phone] Mon-Fri 7:30-4:30 Medicaid - yes, Tricare - yes  [facility]  Carolynn Sayers, MD; Cristino Martes, NP; Gertie Baron, MD; Debara Pickett, NP [place] [address], [place], [place] [address] [phone] Mon-Fri 8:30-5:00, closed for lunch 1-2, Sat 8:30-12:00 - sick visits only Providers come to see babies at [facility] Only accepting newborns of siblings and first time parents ONLY if who have met with office prior to delivery Medicaid -Yes; Tricare - yes  [place], [place]; Fredderick Severance, NP; Juleen China, MD; Clydene Laming, MD:  [place] [address], [place], [place] [address] [phone] Mon- Fri 8:00-5:00, Sat 9:00-12:00 - sick visits only Accepting siblings of established patients and first time mom/baby Medicaid - Yes; Tricare - yes Patients must have vaccinations (baby vaccines)  [place]west [place] [phone] &  [phone])  [place] at [address]., [place], [place] [address] [phone] Mon-Fri 8:00-5:00 Medicaid - no; Tricare - yes  [place]ok Highland, MD; [place], [place]; [place], [place] [address], [place], [place] [address] [phone] Mon-Fri 8:00-5:00 Medicaid- yes; Tricare - yes  [place], MD; Ronnald Ramp, NP; [place], [place] [address] [place], [place], [place] [address] [phone] Mon-Fri 8:00-5:00 Medicaid - Yes; Tricare - yes  [address] [place] [phone])  [place], [place]; [place], [place]; Maisie Fus, MD; Charlesetta Garibaldi, MD; Shepherd, NP; Isenhour, DO; [place], [place]; Jeannine Kitten, MD; Sheila Oats, MD; Hardin Negus, MD; [place], [place]; [place], [place]; [place], [place] [address] [place], [place], [place] [address] [phone] Mon-Fri 8:30-5:00, Sat 9:00-12:00 - sick only Please register online [email or web address] then schedule online or call office Medicaid-Yes; Tricare -yes  [facility] - Premier  Dabrusco, MD; Delora Fuel, MD; [place], MD; Denhoff, NP; [place], [place]; Everette Rank, MD; Radford Pax, NP; Melina Modena, MD [address] [place], [place], [place] [address] [phone] Mon-Fri 8:00-5:30, Sat&Sun by appointment (phones open at 8:30) Medicaid - Yes; Tricare - yes  [place] [phone] & [phone]) [place], CPNP; [place], MD; Araceli Bouche, MD; Jerelene Redden, NP; [place], DO [address], [place], [place] [address] [phone] M-F 8:00 - 5:15, Sat/Sun 9-12 sick visits only Medicaid - No; Tricare - yes  [place], PA-C; [place], PA-C; Aristocrat Ranche[place], DO; Blair, PA-C; [place], PA-C; Vassie Moselle, [place]., [place], [place] [address] [phone] Mon-Thur 8:00-7:00, Fri 8:00-5:00 Accepting Medicaid for 13 and under only   [facility] Medicine  at [place] [facility]) [place], [place]; List, FNP; Selinda Eon, MD; [place], PA-C; Scholer,  MD; Modena Nunnery, FNP; Everlean Patterson, FNP; Tempie Donning, MD; Selinda Eon, [place] [address], [place], [place] [address] [phone] Mon-Fri 8:30-5:30 Medicaid - Yes; Tricare - yes  [place], [place]; Rolla Plate, MD; Carola Rhine, MD; Tyron Russell, MD; [place], NP [address], [place], [place] [address] [phone] Mon-Thur 8:00-5:30, Fri 8:00-5:00, Sat 9:00-12:00 Medicaid - yes, Tricare - yes  [place] [phone])  [place] at [facility], [place]; Olen Pel, MD; [place], [place], [place], [place] [address] [phone] Mon-Fri 8:00-5:00, closed for lunch 12-1 Medicaid - No; [place] - yes  Therapist, music at [facility], [place], [place], [place] [address] [phone] Mon-Fri 8:00-5:00 Medicaid - No; Tricare - yes  [facility], MD; Joelene Millin, MD; Teryl Lucy, MD; Ronnald Ramp, MD [address], [place], [place] [address] [phone] Mon-Fri 8:00-5:00 Medicaid- Yes; Tricare - yes  [place] [phone])  [place] at [facility], [place]; Jew[place], MD [address] [place] [address] [place], [place], [place] [address] [phone] Mon-Fri 8:00-5:00 Medicaid - No; [place] - yes  [place] [address] [place], [place], [place] [address] [phone] Mon-Weds 8:00-6:00, Thurs-Fri 8:00-5:00, Sat 9:00-12:00 Medicaid - yes; Tricare - yes   [place], MD; [place], [place] [address] [place], [place] [address] [phone] Mon-Fri 8:00-5:00 Medicaid - yes; [place] - yes  [facility] Pediatric Providers  [facility] [address] [place], [place] [address] [phone] Gentry Roch: 8am -8pm, Tues, Weds: 8am - 5pm; Fri: 8-1 Medicaid - Yes; Tricare -  yes  [facility] Ilda Mori, MD; Wynetta Emery, MD; Rock Nephew, MD; [place], [place]; [place], [place] [address], [place], [place] [address] [phone] M-F 8:30 - 5:00 Medicaid - Call office; Tricare -yes  [facility] Fredderick Erb, MD; Wynelle Cleveland, MD, Cherylann Banas, MD; Koren Bound, PNP; Terrial Rhodes, [place] [address], [place], [place] [address] [phone] M-F 8:30 - 5:00, Sat/Sun 8:30 - 12:30 (sick visits) Medicaid - Call office; Tricare -yes  [facility] Bobby Rumpf, MD; Wynetta Emery, PNP; Jaynie Crumble, MD; [place], [place]; [place], NP; Orson Aloe [address] [place], [place], [place] [address] [phone] M-F 8:30 - 5:00 Medicaid - Call office; Tricare - yes  [facility] Collene Leyden, MD; Marney Doctor, MD; Melburn Hake, MD; Franki Cabot, MD; Nogo, MD [phone] S[address] [place], [place] [address] [phone] M-Thur: 8:00 - 5:00; Fri: 8:00 - 4:00 Medicaid - yes; Tricare - yes  [facility] [address] [place], [place] [address] [phone] M-F: 8:30- 5:00, closed for lunch 12:30 - 1:00 Medicaid - yes; Tricare -yes  [place] [address] [place], [place] [id number] [phone] M-F 8:00 - 5:00 Medicaid - yes; Tricare - yes  [place], DO; [place], DO; [place], [place] [address] [place], [place] [address] [phone] M-F 8:00 - 5:00, Closed 12-1 for lunch Medicaid - Call; Tricare - yes  [place], MD [address], [place], [place] [address] [phone] M-F: 8:00-5:00, Sat: 8:00 - noon Medicaid - call; [place] -yes  [facility]ar, FNP-C [address] [place], [place],  [address] [phone] M-W: 8:00-5:00, Thur: 8:00 - 7:00, Fri: 8:00 - noon Medicaid - yes; Tricare - yes  [place], [place], [place],  [place] [address] [phone] M-F 8:00 - 5:00, Closed for lunch 12-1 Medicaid -  yes; [place] - yes  [facility] Pediatric Providers  [facility], [place], Trilby Drummer, MD, [place], [place], [facility], [place], [place], [place] [address] [phone] M-T 8:00-5:00, Wed-Fri 7:00-6:00 Medicaid - Yes; Tricare -yes  [place] at [facility], [place]; [address], [place], [place], [place] [address] [phone] M-F 8:00 - 5:00, closed for lunch 12-1 Medicaid - Yes; Tricare - yes  [facility]  Drema Dallas, MD; Marilynn Rail, MD; Kerby Less, MD; Margaretmary Eddy, MD; Damita Dunnings, MD; Deidre Ala, MD; Arrie Eastern, MD, Leward Quan, MD; Sherren Mocha, MD; Emmit Pomfret, MD; Morton Stall, MD; Clydene Laming, MD [address] [place], [place] [address] [phone] M-F 8:00-5:00 Medicaid - yes; Tricare - yes  [facility] [place], MD (speaks [place] and [place]) [address] [place], [place] [address] [phone] M-F: 8:30 - 5:00, closed 12:30 - 1 for lunch Medicaid - Yes; [place] -yes  [facility] Pediatric Providers  [facility] Delice Lesch, MD; Lindell Noe, MD; Lavone Neri, [place], [place], [place] [address] [phone] M-Th: 8:00 - 5:30, Fri: 8:00 - 12:00 Medicaid - yes; Tricare - yes  [facility] at [facility], NP; Verlee Monte, MD; Nadeen Landau, MD [place] [address], [place], [place] [address] [phone] M-F: 8:00 - 5:00 Medicaid - yes; Tricare - yes  [place], NP; Deon Pilling, NP; Angelica Pou, NP; Manya Silvas, MD; Jimmye Norman, MD, [place], NP, Glo Herring, MD; Cathleen Fears [address] [place], [place] [address] [phone] M-F: 8:30 - 5:30p Medicaid - yes; Tricare - yes Other locations available as well  [facility], MD; Redmond Pulling, MD; Marcine Matar, [place], [place], [place] [address] [phone] M-W: 8:00am - 7:00pm, Thurs: 8:00am - 8:00pm; Fri: 8:00am -  5:00pm, closed daily from 12-1 for lunch Medicaid - yes; [place] - yes  [facility] Pediatric Providers  [facility] at Robb Matar, MD; Donella Stade, [place]; Carrolyn Meiers, MD; Johnnette Litter, MD; [place], [place]; Leonie Man; [place], [place]; Alcario Drought, MD;  [address] [place], [place] [address] [phone] Jerilynn Mages - Ludwig Clarks: [place], Sat 9-noon Medicaid - Yes; Tricare -yes  [facility] at Smitty Knudsen, MD; Ronnald Ramp, FNP; Sherryle Lis, MD; Teryl Lucy, [place]. Sharyl Nimrod, [phone] M-F 8:00 - 5:00 Medicaid - call; Tricare - yes  [facility]- Lollie Sails, MD; [place], [place]; Lawana Chambers, MD; Ouida Sills, MD; Red Christians, MD; Marzetta Board, MD; Emiliano Dyer; Randolm Idol, MD; Derald Macleod, MD; [place], [place], [place], [place] [address] [phone] M-F 8:00am - 5:00pm; Sat. 9:00 - 11:00 Medicaid - yes; Tricare - yes  [facility] at [facility], MD [address], [place],  [address] [phone] M-F 8:00 - 5:00 Medicaid -  Medicaid only; Tricare - yes  [facility] - Signa Kell, MD; Rosana Hoes, [place]; Gordan Payment, [place], [place],  [address] [phone] M-F 8:00 - 5:00 Medicaid - yes; Tricare - yes  [facility] [address] Pediatrics - Francine Graven, MD; Owens Shark, MD, [facility], MD, Des [place], MD; Bulpitt, MD; Tamala Julian, MD; [address] Nyoka Lint [place],  [address] [phone] M-F: 8-5 Medicaid - yes; [place] - yes  [place], [place]; [place], MD; [address] [place],  [address] [phone] M-F 8-5 Medicaid - yes; Tricare - yes  [address] [place] Joycelyn Rua, MD; Joelene Millin, MD;  [place]-Court[place], MD; Pellam-Palmer, DNP; V[place], [place], [place], [place] [address] [phone] M-F 8-5 Medicaid - yes; Tricare - yes  [facility] Danelle Earthly, MD;  [place] Moselle; Lucie Leather, MD [address], [place], [place] [id number] [phone] M-F 7am - 5 pm Medicaid - call; Tricare - yes  [place], [place]; Louanne Skye, MD; Quentin Cornwall, [place], [place], [place] [address] [phone] M-F 8-5 Medicaid - yes; Tricare - yes  [facility] Pascal Lux, MD; Suzan Slick, MD; Jimmye Norman, [place]; Rolena Infante, [place]; Thornton Papas, [place]; Robinette Haines; [facility] - FNP [address], [place], [place] [address] [phone] M-F 8-5 Medicaid- yes; Tricare - yes  [facility] - [place] Sellers, Lively and Willa Frater, MD; Dianna Rossetti, MD; Frances Maywood, MD; Nicole Kindred, MD; [place], [place]; Marijean Bravo, MD; Louanne Skye, MD; Benjamine Mola, MD [address], [place], [place] [address] [phone] M-F: 8-5, Sat: 9-4, Sun 9-12 Medicaid - yes; Tricare - yes  [place], PNP; Rosana Hoes, [place] [address] Ny[place] Lint [place], [place] [address] [phone] M-F 8 - 5, closed 12-1 for lunch Medicaid - yes; Tricare - yes  [place], MD; Enid Derry, MD; Benjamine Mola, MD; [place], [place], [place], [place] [address] [phone] M-F 8- 5:30 Medicaid - yes; Tricare - yes  [place], MD; Jeanell Sparrow, MD; Bartholome Bill, Terry England, [place], [place] [address] [phone] Jerilynn Mages: Sharyne Richters; Tues-Fri: 8-5; Sat: 9-12 Medicaid - yes; Tricare - yes  Signa Kell [facility] - Myrene Buddy, MD; Bjorn Loser, MD; Carlis Abbott, MD; Claudean Kinds, MD; Erik Obey, MD [address] [place], [place] [address] [phone] Jerilynn MagesMarland Kitchen Sharyne RichtersDarrin Luis: 8-5; Sat: 8:30-12:30 Medicaid - yes; Tricare - yes  Dareen Piano [place], MD; [place], [place], [place], [place] [address] [phone] Mon-Fri: 8-5 Medicaid - yes;  Tricare - yes  [facility] - [place] Beasley, CPNP; [place], [place]; Benjamine Mola, MD; Donivan Scull, MD; Noel Journey, MD; [address], [place], [place] [address] [phone] M-F: 8-5, closed 1-2 for lunch Medicaid - yes; Tricare - yes  [place], [place]; [place], [place]; Tamala Julian, MD; [place], CPNP; [place], [place]; [place], MD; Juleen China, MD [address] [place], [place], [place] [address] [phone] M-Thurs: Sharyne Richters; Fri: 8-6; Sat: 9-12; Sun 2-4 Medicaid - yes; Tricare - yes  Signa Kell [facility] Cripp[place], MD; Vernard Gambles, MD; Carol Ada, FNP; Marjorie Smolder, DO; [address] [place], [place] [address] [phone] M-F: 8-5 Medicaid - yes; [place] - yes  [facility] Pediatric Providers  [place], MD; Marshall, [place], [place], [place] [address] [phone] M - Fri: 8am - 5pm, closed for lunch 12-1 Medicaid - Yes; Tricare - yes  [facility] [place], MD; Ovid Curd, MD; Sanger, DO; Vinocur, MD;Hall, PA; Volanda Napoleon, [place]; Megan Salon, NP [phone] [place], [place] [address] [phone] M-F 8:00 - 5:00, Sat 8:00 - 11:30 Medicaid - yes; Tricare - yes  [facility] Humphrey Rolls, MD; [place] of Dan, MD, [address], MD, T[place], MD, [place]ih[place], MD; Crowell, NP; [place], [place];  [address], [place], [place] [address] [phone] M-F 8:10am - 5:00pm Medicaid - yes; Tricare - yes  [place], MD; [place], NP [address], [place], [place] [address] [phone] M-F 8:00 - 5:00 Medicaid - [place] Medicaid only; Tricare - yes  [place], [place]; [place]ils, NP [place], [place], [place] [address] [phone] M-F 8:00 - 5:00; Closed for lunch 12 - 1:00 Medicaid - yes;  Tricare - yes  [place], MD; Darius Bump, [place], [place], [place] [address] [phone] Mon 9-5; Tues/Wed 10-5; Thurs 8:30-5; Fri: 8-12:30 Medicaid - yes; Tricare - yes  [facility] Pediatric Providers  [facility]  Cou[place]and, MD; [place], [place] [address], [place], [place] [address] [phone] phone [phone] fax M-F 7:15 - 4:30 Medicaid - yes; Tricare - yes  [place] [facility] Anastasio Champion, MD; [place], DO [address], [place], [place] [address] [phone] M-Fri: 8:30 - 5:00, closed for lunch everyday noon - 1pm Medicaid - Yes; Tricare - yes  [facility] Burdine, MD; Quillian Quince, MD; Nadara Mustard, MD; Quintin Alto, MD; [place], [place]; Antony Blackbird, [place]; [place], [place]; [place], [place]; [place], [place] [address] [place], [place] [address] [phone] M-Thurs: 7:30am - 7:00pm; Friday 7:30am - 4pm; Sat: 8:00 - 1:00 Medicaid - Yes; Tricare - yes  [place] [facility] Freida Busman, MD; Lanny Cramp, MD; Janit Bern, MD; [place], [place]. [address] [place], [place], [place] [address] [phone] M-Thur: 8:00 - 5:00, Fri: 8:00 - Noon Medicaid - yes; Tricare - yes No [place] Dettinger, MD; Lajuana Ripple, DO; [place], NP; Hassell Done, NP; Lilia Pro, NP; Jeneen [place], NP; Thayer Ohm, NP; Livia Snellen, MD; Whitney, [place] [address], [place], [place] [address] [phone] M-F 8:00 - 5:00 Medicaid - yes; Tricare - yes  [place] Collins, FNP-C; [place], FNP-C [address]i Slicker, [place] [address] [phone] M, W, R 8:00-5:00, Tues: 8:00am - 7:00pm; Fri 8:00 - noon Medicaid - Yes; Tricare - yes  [facility], MD [place], [place] [address] [phone]  M-Thurs 8:30-5:30, Fri: 8:30-12:30pm Medicaid - Yes; Tricare - N   Safe Medications in Pregnancy   Acne:  Benzoyl Peroxide  Salicylic Acid    Backache/Headache:  Tylenol: 2 regular strength every 4 hours OR               2 Extra strength every 6 hours   Colds/Coughs/Allergies:  Benadryl (alcohol free) 25 mg every 6 hours as needed  Breath right strips  Claritin  Cepacol throat lozenges  Chloraseptic throat spray  Cold-Eeze- up to three times per day  Cough drops, alcohol free  Flonase (by prescription only)  Guaifenesin  Mucinex  Robitussin DM (plain only, alcohol free)  Saline nasal spray/drops  Sudafed (pseudoephedrine) & Actifed * use only after [redacted] weeks gestation and if you do not have high blood pressure  Tylenol  Vicks Vaporub  Zinc lozenges  Zyrtec   Constipation:  Colace  Ducolax suppositories  Fleet enema  Glycerin suppositories  Metamucil  Milk of magnesia  Miralax  Senokot  Smooth move tea   Diarrhea:  Kaopectate  Imodium A-D   *NO pepto Bismol   Hemorrhoids:  Anusol  Anusol HC  Preparation H  Tucks   Indigestion:  Tums  Maalox  Mylanta  Zantac  Pepcid   Insomnia:  Benadryl (alcohol free) '25mg'$  every 6 hours as needed  Tylenol PM  Unisom,  no Gelcaps   Leg Cramps:  Tums  MagGel   Nausea/Vomiting:  Bonine  Dramamine  Emetrol  Ginger extract  Sea bands  Meclizine  Nausea medication to take during pregnancy:  Unisom (doxylamine succinate 25 mg tablets) Take one tablet daily at bedtime. If symptoms are not adequately controlled, the dose can be increased to a maximum recommended dose of two tablets daily (1/2 tablet in the morning, 1/2 tablet mid-afternoon and one at bedtime).  Vitamin B6 '100mg'$  tablets. Take one tablet twice a day (up to 200 mg per day).   Skin Rashes:  Aveeno products  Benadryl cream or '25mg'$  every 6 hours as needed  Calamine Lotion  1% cortisone cream   Yeast infection:  Gyne-lotrimin 7  Monistat 7    **If taking multiple medications, please check labels to avoid duplicating the same active ingredients  **take medication as directed on the label   ** Do not exceed 4000 mg of tylenol in 24 hours  **Do not take medications that contain aspirin or ibuprofen

## 2022-09-19 NOTE — Progress Notes (Signed)
New OB Intake  I connected with Shelly Koch  on 09/19/22 at  9:15 AM EST by MyChart Video Visit and verified that I am speaking with the correct person using two identifiers. Nurse is located at [facility] and pt is located at home.  I discussed the limitations, risks, security and privacy concerns of performing an evaluation and management service by telephone and the availability of in person appointments. I also discussed with the patient that there may be a patient responsible charge related to this service. The patient expressed understanding and agreed to proceed.  I explained I am completing New OB Intake today. We discussed EDD of 04/02/23 that is based on [place] on 08/12/22 . Pt is G3/P2. I reviewed her allergies, medications, Medical/Surgical/OB history, and appropriate screenings. I informed her of [facility] services. [facility] information placed in AVS. Based on history, this is a high risk pregnancy.  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   Eustachian salpingitis, acute, bilateral 05/29/2020   Pressure sensation in both ears 05/29/2020   Decreased hearing of both ears 05/29/2020   Concern about eye disease without diagnosis 11/01/2019   Pelvic pain 11/24/2018   Nausea 11/24/2018   Dysuria 11/23/2018   Chronic bilateral low back pain without sciatica 11/23/2018   Secondary amenorrhea 11/23/2018   Screening for cervical cancer 11/23/2018   Symptomatic anemia 10/08/2017   Indication for care in labor or delivery 09/17/2017   SVD (spontaneous vaginal delivery) 09/17/2017   Uterine size date discrepancy 09/13/2017   Poor weight gain of pregnancy, unspecified trimester 04/17/2017   Iron deficiency anemia during pregnancy 04/17/2017   Supervision of high risk pregnancy, antepartum 03/05/2017   History of gestational hypertension 03/05/2017   Lower abdominal pain 12/14/2014   Encounter for contraceptive management 11/23/2014   Environmental and  seasonal allergies 10/18/2014   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 Patient given information for [facility] Healthy Baby website for more information about [place] [facility] [place]. Patient is not interested in water birth. Offered upcoming OB visit with CNM to discuss further.  MyChart/Babyscripts MyChart access verified. I explained pt will have some visits in office and some virtually. Babyscripts instructions given and order placed. Patient verifies receipt of registration text/e-mail. Account successfully created and app downloaded.  Blood Pressure Cuff/Weight Scale Blood pressure cuff ordered for patient to pick-up from [employer]. Explained after first prenatal appt pt will check weekly and document in 76. Patient does have weight scale.  Anatomy US Explained first scheduled [place] will be around 19 weeks. Anatomy US scheduled for 11/11/22 at 0830a. Pt notified to arrive at 0815a.  Labs Discussed Johnsie Cancel genetic screening with patient. Would like both Panorama and Horizon drawn at new OB visit. Routine prenatal labs needed.  COVID Vaccine Patient has had COVID vaccine.   Is patient a CenteringPregnancy candidate?  Not a Candidate  Not a candidate due to Complex coordination of care needed     Is patient a Mom+Baby Combined Care candidate?  Not a candidate    Social Determinants of Health Food Insecurity: Patient denies food insecurity. WIC Referral: Patient is interested in referral to [facility].  Transportation: Patient denies transportation needs. Childcare: Discussed no children allowed at ultrasound appointments. Offered childcare services; patient declines childcare services at this time.  Interested in [place]? If yes,  send referral.   First visit review I reviewed new OB appt with patient. Explained pt will be seen by  Manya Silvas, CNM at first visit; encounter routed to appropriate provider. Explained that patient will be seen by pregnancy navigator following visit with provider.   Shelly Koch, [place] 09/19/2022  9:24 AM

## 2022-09-23 ENCOUNTER — Inpatient Hospital Stay (HOSPITAL_COMMUNITY)
Admission: AD | Admit: 2022-09-23 | Discharge: 2022-09-23 | Disposition: A | Discharge status: Home / Self Care | Payer: Medicaid Other | Attending: Family Medicine | Admitting: Family Medicine

## 2022-09-23 ENCOUNTER — Encounter (HOSPITAL_COMMUNITY): Payer: Self-pay | Admitting: Family Medicine

## 2022-09-23 DIAGNOSIS — O99891 Other specified diseases and conditions complicating pregnancy: Secondary | ICD-10-CM | POA: Insufficient documentation

## 2022-09-23 DIAGNOSIS — N3000 Acute cystitis without hematuria: Secondary | ICD-10-CM | POA: Insufficient documentation

## 2022-09-23 DIAGNOSIS — Z3A12 12 weeks gestation of pregnancy: Secondary | ICD-10-CM | POA: Diagnosis not present

## 2022-09-23 DIAGNOSIS — O2310 Infections of bladder in pregnancy, unspecified trimester: Secondary | ICD-10-CM | POA: Diagnosis not present

## 2022-09-23 DIAGNOSIS — O26891 Other specified pregnancy related conditions, first trimester: Secondary | ICD-10-CM | POA: Insufficient documentation

## 2022-09-23 DIAGNOSIS — O2311 Infections of bladder in pregnancy, first trimester: Secondary | ICD-10-CM | POA: Insufficient documentation

## 2022-09-23 HISTORY — DX: Unspecified asthma, uncomplicated: J45.909

## 2022-09-23 HISTORY — DX: Headache, unspecified: R51.9

## 2022-09-23 HISTORY — DX: Urinary tract infection, site not specified: N39.0

## 2022-09-23 LAB — URINALYSIS, ROUTINE W REFLEX MICROSCOPIC
Bilirubin Urine: NEGATIVE
Glucose, UA: NEGATIVE mg/dL
Ketones, ur: NEGATIVE mg/dL
Nitrite: NEGATIVE
Protein, ur: NEGATIVE mg/dL
Specific Gravity, Urine: 1.006 (ref 1.005–1.030)
pH: 7 (ref 5.0–8.0)

## 2022-09-23 MED ORDER — CEFADROXIL 500 MG PO CAPS: 500.0000 mg | ORAL_CAPSULE | Freq: Two times a day (BID) | ORAL | 0 refills | Status: DC

## 2022-09-23 MED ORDER — PHENAZOPYRIDINE HCL 200 MG PO TABS: 200.0000 mg | ORAL_TABLET | Freq: Three times a day (TID) | ORAL | 0 refills | Status: AC

## 2022-09-23 NOTE — MAU Provider Note (Signed)
History     CSN: [id number]  Arrival date and time: 09/23/22 1217   Event Date/Time   First Provider Initiated Contact with Patient 09/23/22 1616      Chief Complaint  Patient presents with   Abdominal Pain   Dysuria   HPI  Shelly Koch is a 32 y.o. G3P2002 at 76w5dwho presents for evaluation of lower abdominal pain and dysuria. Patient reports painful urination and sharp pains in her bladder. She reports the symptoms started yesterday. Patient rates the pain as a 7/10 when they come and has not tried anything for the pain. She denies any vaginal bleeding, discharge, and leaking of fluid. Denies any constipation, diarrhea or any urinary complaints.   OB History     Gravida  3   Para  2   Term  2   Preterm      AB      Living  2      SAB      IAB      Ectopic      Multiple  0   Live Births  2        Obstetric Comments  2015- started as twin preg, GDM         Past Medical History:  Diagnosis Date   Anemia    Asthma    as child   Gestational diabetes    first preg   Gestational trophoblastic neoplasm    Headache    PICC (peripherally inserted central catheter) in place 12/24/2014   Preterm labor    Pyelonephritis    UTI (urinary tract infection)     Past Surgical History:  Procedure Laterality Date   DILATION AND EVACUATION N/A 08/10/2014   Procedure: DILATATION AND EVACUATION;  Surgeon: CLavonia Drafts MD;  [place] [facility];  Service: Gynecology;  Laterality: N/A;   DILATION AND EVACUATION N/A 10/08/2017   Procedure: DILATATION AND EVACUATION;  Surgeon: CMora Bellman MD;  [facility];  Service: Gynecology;  Laterality: N/A;   HYSTEROSCOPY N/A 08/10/2014   Procedure: HYSTEROSCOPY;  Surgeon: CLavonia Drafts MD;  [place];  Service: Gynecology;  Laterality: N/A;    Family History  Problem Relation Age of Onset   Healthy Mother    Healthy Father    Alcohol abuse Neg Hx     Social History   Tobacco Use    Smoking status: Never   Smokeless tobacco: Never  Vaping Use   Vaping Use: Never used  Substance Use Topics   Alcohol use: No   Drug use: No    Allergies:  Allergies  Allergen Reactions   Okra     Hives and itching   Ondansetron Hives and Itching   Phenergan [Promethazine] Hives and Itching    Note: pt tolerates PO COMPAZINE   Zofran [Ondansetron Hcl] Rash    Medications Prior to Admission  Medication Sig Dispense Refill Last Dose   acetaminophen (TYLENOL) 500 MG tablet Take 500 mg by mouth every 6 (six) hours as needed.   09/23/2022 at 1100   Doxylamine-Pyridoxine (DICLEGIS) 10-10 MG TBEC Take 2 tablets by mouth at bedtime. If symptoms persist, add one tablet in the morning and one in the afternoon 100 tablet 5 Past Month   omeprazole (PRILOSEC) 20 MG capsule Take 1 capsule (20 mg total) by mouth daily. 30 capsule 3 09/22/2022   prenatal vitamin w/FE, FA (PRENATAL 1 + 1) 27-1 MG TABS tablet Take 1 tablet by mouth daily at 12 noon.  09/22/2022   Blood Pressure Monitoring DEVI 1 each by Does not apply route once a week. 1 each 0     Review of Systems  Constitutional: Negative.  Negative for fatigue and fever.  HENT: Negative.    Respiratory: Negative.  Negative for shortness of breath.   Cardiovascular: Negative.  Negative for chest pain.  Gastrointestinal: Negative.  Negative for abdominal pain, constipation, diarrhea, nausea and vomiting.  Genitourinary:  Positive for dysuria, frequency, pelvic pain and urgency. Negative for vaginal bleeding and vaginal discharge.  Neurological: Negative.  Negative for dizziness and headaches.   Physical Exam   Blood pressure 110/78, pulse 79, temperature 98.1 F (36.7 C), resp. rate 16, height '4\' 10"'$  (1.473 m), weight 46.7 kg, last menstrual period 06/16/2022, SpO2 100 %.  Patient Vitals for the past 24 hrs:  BP Temp Pulse Resp SpO2 Height Weight  09/23/22 1601 110/78 98.1 F (36.7 C) 79 16 100 % -- --  09/23/22 1306 109/73 98.5 F (36.9  C) (!) 108 18 100 % '4\' 10"'$  (1.473 m) 46.7 kg    Physical Exam Vitals and nursing note reviewed.  Constitutional:      General: She is not in acute distress.    Appearance: She is well-developed.  HENT:     Head: Normocephalic.  Eyes:     Pupils: Pupils are equal, round, and reactive to light.  Cardiovascular:     Rate and Rhythm: Normal rate and regular rhythm.     Heart sounds: Normal heart sounds.  Pulmonary:     Effort: Pulmonary effort is normal. No respiratory distress.     Breath sounds: Normal breath sounds.  Abdominal:     General: Bowel sounds are normal. There is no distension.     Palpations: Abdomen is soft.     Tenderness: There is no abdominal tenderness.  Skin:    General: Skin is warm and dry.  Neurological:     Mental Status: She is alert and oriented to person, place, and time.  Psychiatric:        Mood and Affect: Mood normal.        Behavior: Behavior normal.        Thought Content: Thought content normal.        Judgment: Judgment normal.     FHT: 169 bpm   MAU Course  Procedures  Results for orders placed or performed during the hospital encounter of 09/23/22 (from the past 24 hour(s))  Urinalysis, Routine w reflex microscopic -Urine, Clean Catch     Status: Abnormal   Collection Time: 09/23/22  1:37 PM  Result Value Ref Range   Color, Urine YELLOW YELLOW   APPearance HAZY (A) CLEAR   Specific Gravity, Urine 1.006 1.005 - 1.030   pH 7.0 5.0 - 8.0   Glucose, UA NEGATIVE NEGATIVE mg/dL   Hgb urine dipstick MODERATE (A) NEGATIVE   Bilirubin Urine NEGATIVE NEGATIVE   Ketones, ur NEGATIVE NEGATIVE mg/dL   Protein, ur NEGATIVE NEGATIVE mg/dL   Nitrite NEGATIVE NEGATIVE   Leukocytes,Ua MODERATE (A) NEGATIVE   RBC / HPF 0-5 0 - 5 RBC/hpf   WBC, UA 6-10 0 - 5 WBC/hpf   Bacteria, UA MANY (A) NONE SEEN   Squamous Epithelial / HPF 6-10 0 - 5 /HPF   Mucus PRESENT    Sperm, UA PRESENT     MDM Labs ordered and reviewed.   UA, UC Cervix  closed  Given patient symptomatic with large leukocytes on UA, will treat for  acute cystitis  Assessment and Plan   1. Acute cystitis during pregnancy, antepartum   2. [redacted] weeks gestation of pregnancy    -Discharge home in stable condition -Rx for duricef and pyridium sent to pharmacy -UTI precautions discussed -Patient advised to follow-up with OB as scheduled for prenatal care -Patient may return to MAU as needed or if her condition were to change or worsen  Wende Mott, CNM 09/23/2022, 4:16 PM

## 2022-09-23 NOTE — MAU Note (Signed)
.  Shelly Koch is a 32 y.o. at 31w5dhere in MAU reporting: c/o pain in her lower abd that is sharp and intermittent. Also c/o burning with urination.that started yesterday.  Has had the pain for a while but got worse yesterday .  LMP:  Onset of complaint: yesterday Pain score: 7 Vitals:   09/23/22 1306  BP: 109/73  Pulse: (!) 108  Resp: 18  Temp: 98.5 F (36.9 C)  SpO2: 100%     FHT:169 Lab orders placed from triage:

## 2022-09-23 NOTE — Discharge Instructions (Signed)

## 2022-09-23 NOTE — MAU Note (Addendum)
Increase in frequency with urination. Sharp pain and burning with urination.  Reports heavy, thick yellow vag d/c  yesterday.  Sometimes the pain feels like a ctx. Hx of migraines, had HA(mostly in back of head) started yesterday, some nausea.

## 2022-09-24 ENCOUNTER — Encounter: Payer: Medicaid Other | Admitting: Obstetrics and Gynecology

## 2022-09-27 ENCOUNTER — Other Ambulatory Visit: Payer: Self-pay

## 2022-09-27 ENCOUNTER — Encounter: Payer: Self-pay | Admitting: Advanced Practice Midwife

## 2022-09-27 ENCOUNTER — Other Ambulatory Visit (HOSPITAL_COMMUNITY)
Admission: RE | Admit: 2022-09-27 | Discharge: 2022-09-27 | Disposition: A | Discharge status: Home / Self Care | Payer: Medicaid Other | Source: Ambulatory Visit | Attending: Obstetrics and Gynecology | Admitting: Obstetrics and Gynecology

## 2022-09-27 ENCOUNTER — Ambulatory Visit (INDEPENDENT_AMBULATORY_CARE_PROVIDER_SITE_OTHER): Payer: Medicaid Other | Admitting: Advanced Practice Midwife

## 2022-09-27 VITALS — BP 114/74 | HR 88 | Ht <= 58 in | Wt 106.1 lb

## 2022-09-27 DIAGNOSIS — Z8759 Personal history of other complications of pregnancy, childbirth and the puerperium: Secondary | ICD-10-CM

## 2022-09-27 DIAGNOSIS — Z3480 Encounter for supervision of other normal pregnancy, unspecified trimester: Secondary | ICD-10-CM | POA: Insufficient documentation

## 2022-09-27 DIAGNOSIS — Z3A13 13 weeks gestation of pregnancy: Secondary | ICD-10-CM | POA: Diagnosis not present

## 2022-09-27 DIAGNOSIS — O2341 Unspecified infection of urinary tract in pregnancy, first trimester: Secondary | ICD-10-CM | POA: Diagnosis not present

## 2022-09-27 DIAGNOSIS — Z87898 Personal history of other specified conditions: Secondary | ICD-10-CM | POA: Diagnosis not present

## 2022-09-27 DIAGNOSIS — O0991 Supervision of high risk pregnancy, unspecified, first trimester: Secondary | ICD-10-CM

## 2022-09-27 DIAGNOSIS — Z23 Encounter for immunization: Secondary | ICD-10-CM

## 2022-09-27 DIAGNOSIS — Z87448 Personal history of other diseases of urinary system: Secondary | ICD-10-CM

## 2022-09-27 DIAGNOSIS — O0990 Supervision of high risk pregnancy, unspecified, unspecified trimester: Secondary | ICD-10-CM

## 2022-09-27 MED ORDER — PNV-SELECT 27-0.6-0.4 MG PO TABS: 1.0000 | ORAL_TABLET | Freq: Every day | ORAL | 12 refills | Status: AC

## 2022-09-27 NOTE — Progress Notes (Unsigned)
INITIAL PRENATAL VISIT  Subjective:   Shelly Koch is 32 y.o. G45P2002 female being seen today for her first obstetrical visit. She has not received other prenatal care previously this pregnancy. This is not a planned pregnancy. This is a desired pregnancy but she is nervous about it. Was seeing RE and planned gender selection.  She is at 23w2dgestation by [place] obstetrical history is significant for  GTN, GHTN . Relationship with FOB: spouse, living together. Patient  is not sure if she  intends to breast feed. Pregnancy history fully reviewed.  Review of Systems:   ROS  dysuria . On ABX for UTI. Was not able to fill   Objective:    Obstetric History OB History  Gravida Para Term Preterm AB Living  '3 2 2     2  '$ SAB IAB Ectopic Multiple Live Births        0 2    # Outcome Date GA Lbr Len/2nd Weight Sex Delivery Anes PTL Lv  3 Current           2 Term 09/17/17 39w6d9:30 / 00:20 6 lb 6.3 oz (2.9 kg) F Vag-Spont None  LIV  1 Term 06/05/14 3712w5d:58 / 00:45 4 lb 10.4 oz (2.11 kg) F Vag-Spont Local  LIV     Birth Comments: NONE    Obstetric Comments  2015- started as twin preg, GDM    Past Medical History:  Diagnosis Date   Anemia    Asthma    as child   Gestational diabetes    first preg   Gestational trophoblastic neoplasm    Headache    PICC (peripherally inserted central catheter) in place 12/24/2014   Preterm labor    Pyelonephritis    UTI (urinary tract infection)     Past Surgical History:  Procedure Laterality Date   DILATION AND EVACUATION N/A 08/10/2014   Procedure: DILATATION AND EVACUATION;  Surgeon: CarLavonia DraftsD;  [facility];  Service: Gynecology;  Laterality: N/A;   DILATION AND EVACUATION N/A 10/08/2017   Procedure: DILATATION AND EVACUATION;  Surgeon: ConMora BellmanD;  [place] [facility];  Service: Gynecology;  Laterality: N/A;   HYSTEROSCOPY N/A 08/10/2014   Procedure: HYSTEROSCOPY;  Surgeon: CarLavonia DraftsD;   [place] [facility];  Service: Gynecology;  Laterality: N/A;    Current Outpatient Medications on File Prior to Visit  Medication Sig Dispense Refill   acetaminophen (TYLENOL) 500 MG tablet Take 500 mg by mouth every 6 (six) hours as needed.     Blood Pressure Monitoring DEVI 1 each by Does not apply route once a week. 1 each 0   cefadroxil (DURICEF) 500 MG capsule Take 1 capsule (500 mg total) by mouth 2 (two) times daily. 14 capsule 0   Doxylamine-Pyridoxine (DICLEGIS) 10-10 MG TBEC Take 2 tablets by mouth at bedtime. If symptoms persist, add one tablet in the morning and one in the afternoon 100 tablet 5   omeprazole (PRILOSEC) 20 MG capsule Take 1 capsule (20 mg total) by mouth daily. 30 capsule 3   phenazopyridine (PYRIDIUM) 200 MG tablet Take 1 tablet (200 mg total) by mouth 3 (three) times daily for 7 days. 21 tablet 0   prenatal vitamin w/FE, FA (PRENATAL 1 + 1) 27-1 MG TABS tablet Take 1 tablet by mouth daily at 12 noon.     No current facility-administered medications on file prior to visit.    Allergies  Allergen Reactions   Okra  Hives and itching   Ondansetron Hives and Itching   Phenergan [Promethazine] Hives and Itching    Note: pt tolerates PO COMPAZINE   Zofran [Ondansetron Hcl] Rash    Social History:  reports that she has never smoked. She has never used smokeless tobacco. She reports that she does not drink alcohol and does not use drugs.  Family History  Problem Relation Age of Onset   Healthy Mother    Healthy Father    Alcohol abuse Neg Hx     The following portions of the patient's history were reviewed and updated as appropriate: allergies, current medications, past family history, past medical history, past social history, past surgical history and problem list.  Physical Exam:  BP 114/74   Pulse 88   Wt 106 lb 1.6 oz (48.1 kg)   LMP 06/16/2022   BMI 22.17 kg/m  CONSTITUTIONAL: Well-developed, well-nourished female in no acute distress.  HENT:   Normocephalic, atraumatic. Oropharynx is clear and moist EYES: Conjunctivae normal. No scleral icterus.  SKIN: Skin is warm and dry. No rash noted. Not diaphoretic. No erythema. No pallor. MUSCULOSKELETAL: Normal range of motion. No tenderness.  No cyanosis, clubbing, or edema.   NEUROLOGIC: Alert and oriented to person, place, and time. Normal muscle tone coordination.  PSYCHIATRIC: Normal mood and affect. Normal behavior. Normal judgment and thought content. CARDIOVASCULAR: Normal heart rate noted. RESPIRATORY: Effort and rate normal. BREASTS: Declined ABDOMEN: Soft, no distention, tenderness, rebound or guarding. Fundal ht: 13 PELVIC: Normal appearing external genitalia; normal appearing vaginal mucosa and cervix.  No abnormal discharge noted.  Pap smear not obtained.  Uterus S=D, no other palpable masses, no uterine or adnexal tenderness. Fetal Status: Fetal Heart Rate (bpm): 164   Movement: Absent     Indications for ASA therapy (per uptodate) One of the following: Previous pregnancy with preeclampsia, especially early onset and with an adverse outcome {yes/no:20286} Multifetal gestation {yes/no:20286} Chronic hypertension {yes/no:20286} Type 1 or 2 diabetes mellitus {yes/no:20286} Chronic kidney disease {yes/no:20286} Autoimmune disease (antiphospholipid syndrome, systemic lupus erythematosus) {yes/no:20286}  Two or more of the following: Nulliparity {yes/no:20286} Obesity (body mass index >30 kg/m2) {yes/no:20286} Family history of preeclampsia in mother or sister {yes/no:20286} Age ?35 years {yes/no:20286} Sociodemographic characteristics (African American race, low socioeconomic level) {yes/no:20286} Personal risk factors (eg, previous pregnancy with low birth weight or small for gestational age infant, previous adverse pregnancy outcome [eg, stillbirth], interval >10 years between pregnancies) {yes/no:20286}  Indications for early GDM screening  First-degree relative with  diabetes {yes/no:20286} BMI >30kg/m2 {yes/no:20286} Age > 35 {yes/no:20286} Previous birth of an infant weighing ?4000 g {yes/no:20286} Gestational diabetes mellitus in a previous pregnancy {yes/no:20286} Glycated hemoglobin ?5.7 percent (39 mmol/mol), impaired glucose tolerance, or impaired fasting glucose on previous testing {yes/no:20286} High-risk race/ethnicity (eg, African American, Latino, Native American, [place] American, [place] Islander) {yes/no:20286} Previous stillbirth of unknown cause {yes/no:20286} Maternal birthweight > 9 lbs {yes/no:20286} History of cardiovascular disease {yes/no:20286} Hypertension or on therapy for hypertension {yes/no:20286} High-density lipoprotein cholesterol level <35 mg/dL (0.90 mmol/L) and/or a triglyceride level >250 mg/dL (2.82 mmol/L) {yes/no:20286} Polycystic ovary syndrome {yes/no:20286} Physical inactivity {yes/no:20286} Other clinical condition associated with insulin resistance (eg, severe obesity, acanthosis nigricans) {yes/no:20286} Current use of glucocorticoids {yes/no:20286}  Early screening tests: FBS, A1C, Random CBG, glucose challenge   Assessment:   Pregnancy: G3P2002 1. Supervision of other normal pregnancy, antepartum  - Culture, OB Urine - GC/Chlamydia probe amp ([place])not at [facility] - Hemoglobin A1c - CBC/D/Plt+RPR+Rh+ABO+RubIgG... - Protein / creatinine ratio, urine - CMP14+EGFR  2. Need for immunization against influenza  - Flu Vaccine QUAD 29moIM (Fluarix, Fluzone & Alfiuria Quad PF)  3. History of poor fetal growth - Growth   4. [redacted] weeks gestation of pregnancy ***    Plan:  Initial labs drawn/reviewed. Prenatal vitamins. Rx ASA for reduction of risk for preeclampsia.  Problem list reviewed and updated. Genetic screening discussed: NIPS/First trimester screen/Quad/AFP ordered. Role of ultrasound in pregnancy discussed; Anatomy [place] requested. Amniocentesis discussed: not indicated. Follow up in 4  weeks. (Traditional/Centering/Mom-Baby Combined Care) Discussed clinic routines, schedule of care and testing, genetic screening options, involvement of students and residents under the direct supervision of APPs and doctors and presence of female providers. Pt verbalized understanding.  [place] CNM 09/27/2022 11:11 AM

## 2022-09-27 NOTE — Patient Instructions (Addendum)
Azo for UTI pain. 200 mg three times per day.    CenteringPregnancy is a model of prenatal care that started 30 years ago and is used in about 600 practices around the [place]. You meet with a group of 8-12 women due around the same time as you. In Centering you will have individual time with the provider and meet as a group. There's much more time for discussion and learning. You will actually have much more time with your provider in Centering than in traditional prenatal care.? You will come directly into the Centering room and will not wait in the lobby so there is no wasted time. You will have 2-hour visits every 4 weeks then every 2 weeks. You will know your Centering prenatal appointments in advance. In your last month of pregnancy, you may also come in for some individual visits. Additional appointments can be scheduled if you need more care. Studies have shown that CenteringPregnancy improves birth outcomes. We have seen especially big improvements in fewer Black women delivering babies who are too small or born too early. Visit the website [facility]Healthcare for more information. Let your provider or clinic staff know if you want to sign up.

## 2022-09-28 LAB — CMP14+EGFR
ALT: 14 IU/L (ref 0–32)
AST: 13 IU/L (ref 0–40)
Albumin/Globulin Ratio: 1.4 (ref 1.2–2.2)
Albumin: 3.8 g/dL — ABNORMAL LOW (ref 3.9–4.9)
Alkaline Phosphatase: 48 IU/L (ref 44–121)
BUN/Creatinine Ratio: 8 — ABNORMAL LOW (ref 9–23)
BUN: 3 mg/dL — ABNORMAL LOW (ref 6–20)
Bilirubin Total: <0.2 mg/dL (ref 0.0–1.2)
CO2: 21 mmol/L (ref 20–29)
Calcium: 9.2 mg/dL (ref 8.7–10.2)
Chloride: 101 mmol/L (ref 96–106)
Creatinine, Ser: 0.4 mg/dL — ABNORMAL LOW (ref 0.57–1.00)
Globulin, Total: 2.7 g/dL (ref 1.5–4.5)
Glucose: 114 mg/dL — ABNORMAL HIGH (ref 70–99)
Potassium: 3.9 mmol/L (ref 3.5–5.2)
Sodium: 137 mmol/L (ref 134–144)
Total Protein: 6.5 g/dL (ref 6.0–8.5)
eGFR: 134 mL/min/{1.73_m2} (ref >59)

## 2022-09-28 LAB — CBC/D/PLT+RPR+RH+ABO+RUBIGG...
Antibody Screen: NEGATIVE
Basophils Absolute: 0 10*3/uL (ref 0.0–0.2)
Basos: 0 %
EOS (ABSOLUTE): 0.1 10*3/uL (ref 0.0–0.4)
Eos: 2 %
HCV Ab: NONREACTIVE
HIV Screen 4th Generation wRfx: NONREACTIVE
Hematocrit: 35.4 % (ref 34.0–46.6)
Hemoglobin: 11.4 g/dL (ref 11.1–15.9)
Hepatitis B Surface Ag: NEGATIVE
Immature Grans (Abs): 0 10*3/uL (ref 0.0–0.1)
Immature Granulocytes: 0 %
Lymphocytes Absolute: 2.8 10*3/uL (ref 0.7–3.1)
Lymphs: 38 %
MCH: 31.8 pg (ref 26.6–33.0)
MCHC: 32.2 g/dL (ref 31.5–35.7)
MCV: 99 fL — ABNORMAL HIGH (ref 79–97)
Monocytes Absolute: 0.4 10*3/uL (ref 0.1–0.9)
Monocytes: 6 %
Neutrophils Absolute: 4 10*3/uL (ref 1.4–7.0)
Neutrophils: 54 %
Platelets: 181 10*3/uL (ref 150–450)
RBC: 3.59 x10E6/uL — ABNORMAL LOW (ref 3.77–5.28)
RDW: 12 % (ref 11.7–15.4)
RPR Ser Ql: NONREACTIVE
Rh Factor: POSITIVE
Rubella Antibodies, IGG: 1.11 index (ref >0.99)
WBC: 7.4 10*3/uL (ref 3.4–10.8)

## 2022-09-28 LAB — HCV INTERPRETATION

## 2022-09-28 LAB — HEMOGLOBIN A1C
Est. average glucose Bld gHb Est-mCnc: 114 mg/dL
Hgb A1c MFr Bld: 5.6 % (ref 4.8–5.6)

## 2022-09-30 LAB — GC/CHLAMYDIA PROBE AMP (~~LOC~~) NOT AT ARMC
Chlamydia: NEGATIVE
Comment: NEGATIVE
Comment: NORMAL
Neisseria Gonorrhea: NEGATIVE

## 2022-09-30 LAB — URINE CULTURE, OB REFLEX: Organism ID, Bacteria: NO GROWTH

## 2022-09-30 LAB — PROTEIN / CREATININE RATIO, URINE
Creatinine, Urine: 16.4 mg/dL
Protein, Ur: <4 mg/dL

## 2022-09-30 LAB — CULTURE, OB URINE

## 2022-10-01 ENCOUNTER — Encounter: Payer: Self-pay | Admitting: Advanced Practice Midwife

## 2022-10-01 DIAGNOSIS — Z87448 Personal history of other diseases of urinary system: Secondary | ICD-10-CM | POA: Insufficient documentation

## 2022-10-02 ENCOUNTER — Encounter: Payer: Self-pay | Admitting: *Deleted

## 2022-10-06 LAB — HORIZON CUSTOM: REPORT SUMMARY: NEGATIVE

## 2022-10-06 LAB — PANORAMA PRENATAL TEST FULL PANEL:PANORAMA TEST PLUS 5 ADDITIONAL MICRODELETIONS: FETAL FRACTION: 17.6

## 2022-10-16 ENCOUNTER — Encounter: Payer: Self-pay | Admitting: Advanced Practice Midwife

## 2022-10-28 ENCOUNTER — Ambulatory Visit (INDEPENDENT_AMBULATORY_CARE_PROVIDER_SITE_OTHER): Payer: Medicaid Other | Admitting: Advanced Practice Midwife

## 2022-10-28 VITALS — BP 118/79 | HR 99 | Wt 110.2 lb

## 2022-10-28 DIAGNOSIS — Z3A18 18 weeks gestation of pregnancy: Secondary | ICD-10-CM

## 2022-10-28 DIAGNOSIS — Z3482 Encounter for supervision of other normal pregnancy, second trimester: Secondary | ICD-10-CM | POA: Diagnosis not present

## 2022-10-28 DIAGNOSIS — Z3480 Encounter for supervision of other normal pregnancy, unspecified trimester: Secondary | ICD-10-CM

## 2022-10-28 DIAGNOSIS — Z8759 Personal history of other complications of pregnancy, childbirth and the puerperium: Secondary | ICD-10-CM

## 2022-10-30 ENCOUNTER — Encounter: Payer: Self-pay | Admitting: *Deleted

## 2022-10-30 NOTE — Progress Notes (Signed)
       PRENATAL VISIT NOTE- Centering Pregnancy Cycle 12, Session # 1  Subjective:  Shelly Koch is a 32 y.o. G3P2002 at [redacted]w[redacted]d being seen today for ongoing prenatal care through Centering Pregnancy.  She is currently monitored for the following issues for this low-risk pregnancy and has History of poor fetal growth; Gestational trophoblastic neoplasm; History of gestational hypertension; Chronic bilateral low back pain without sciatica; Decreased hearing of both ears; Asherman's syndrome; Supervision of high risk pregnancy, antepartum; and History of pyelonephritis on their problem list.  Patient reports no complaints.   . Vag. Bleeding: None.  Movement: Present. Denies leaking of fluid/ROM.   The following portions of the patient's history were reviewed and updated as appropriate: allergies, current medications, past family history, past medical history, past social history, past surgical history and problem list. Problem list updated.  Objective:   Vitals:   10/28/22 1030  BP: 118/79  Pulse: 99  Weight: 110 lb 3.2 oz (50 kg)    Fetal Status: Fetal Heart Rate (bpm): 155   Movement: Present     General:  Alert, oriented and cooperative. Patient is in no acute distress.  Skin: Skin is warm and dry. No rash noted.   Cardiovascular: Normal heart rate noted  Respiratory: Normal respiratory effort, no problems with respiration noted  Abdomen: Soft, gravid, appropriate for gestational age.        Pelvic: Cervical exam deferred        Extremities: Normal range of motion.  Edema: None  Mental Status: Normal mood and affect. Normal behavior. Normal judgment and thought content.   Assessment and Plan:  Pregnancy: G3P2002 at [redacted]w[redacted]d  1. Supervision of other normal pregnancy, antepartum   Centering Pregnancy, Session#1: Introduction to model of care. Group determined rules for self-governance and closing phrase. Oriented group to space and mother's notebook.   Facilitated discussion  today:  common discomforts, When to call practice  Mindfulness activity completed as well as introduction to deep breathing for childbirth preparation- Centering 3 Breaths  Fundal height and FHR appropriate today unless noted otherwise in plan of care. Patient to continue group care.    2. [redacted] weeks gestation of pregnancy   3. History of gestational trophoblastic disease     Preterm labor symptoms and general obstetric precautions including but not limited to vaginal bleeding, contractions, leaking of fluid and fetal movement were reviewed in detail with the patient. Please refer to After Visit Summary for other counseling recommendations.  No follow-ups on file.  Future Appointments  Date Time Provider Department Center  11/11/2022  8:15 AM [facility] NURSE [facility]  11/11/2022  8:30 AM [facility] US3 [facility]  11/25/2022  9:00 AM CENTERING PROVIDER [facility]  12/23/2022  9:00 AM CENTERING PROVIDER [facility]  01/06/2023  9:00 AM CENTERING PROVIDER [facility]  01/20/2023  9:00 AM CENTERING PROVIDER [facility]  02/03/2023  9:00 AM CENTERING PROVIDER [facility]  02/17/2023  9:00 AM CENTERING PROVIDER [facility]  03/03/2023  9:00 AM CENTERING PROVIDER [facility]  03/17/2023  9:00 AM CENTERING PROVIDER [facility]  03/31/2023  9:00 AM CENTERING PROVIDER [facility]    Sharen Counter, CNM

## 2022-11-04 DIAGNOSIS — O09299 Supervision of pregnancy with other poor reproductive or obstetric history, unspecified trimester: Secondary | ICD-10-CM | POA: Insufficient documentation

## 2022-11-06 ENCOUNTER — Inpatient Hospital Stay (HOSPITAL_COMMUNITY)
Admission: AD | Admit: 2022-11-06 | Discharge: 2022-11-06 | Disposition: A | Discharge status: Home / Self Care | Payer: Medicaid Other | Attending: Obstetrics and Gynecology | Admitting: Obstetrics and Gynecology

## 2022-11-06 ENCOUNTER — Other Ambulatory Visit: Payer: Self-pay

## 2022-11-06 DIAGNOSIS — Z1152 Encounter for screening for COVID-19: Secondary | ICD-10-CM | POA: Insufficient documentation

## 2022-11-06 DIAGNOSIS — M549 Dorsalgia, unspecified: Secondary | ICD-10-CM | POA: Insufficient documentation

## 2022-11-06 DIAGNOSIS — R1030 Lower abdominal pain, unspecified: Secondary | ICD-10-CM | POA: Insufficient documentation

## 2022-11-06 DIAGNOSIS — J020 Streptococcal pharyngitis: Secondary | ICD-10-CM | POA: Diagnosis not present

## 2022-11-06 DIAGNOSIS — Z3A19 19 weeks gestation of pregnancy: Secondary | ICD-10-CM | POA: Diagnosis not present

## 2022-11-06 DIAGNOSIS — O26892 Other specified pregnancy related conditions, second trimester: Secondary | ICD-10-CM | POA: Insufficient documentation

## 2022-11-06 DIAGNOSIS — O99512 Diseases of the respiratory system complicating pregnancy, second trimester: Secondary | ICD-10-CM | POA: Insufficient documentation

## 2022-11-06 LAB — CBC
HCT: 30.5 % — ABNORMAL LOW (ref 36.0–46.0)
Hemoglobin: 10.1 g/dL — ABNORMAL LOW (ref 12.0–15.0)
MCH: 32.5 pg (ref 26.0–34.0)
MCHC: 33.1 g/dL (ref 30.0–36.0)
MCV: 98.1 fL (ref 80.0–100.0)
Platelets: 161 10*3/uL (ref 150–400)
RBC: 3.11 MIL/uL — ABNORMAL LOW (ref 3.87–5.11)
RDW: 12.6 % (ref 11.5–15.5)
WBC: 15.6 10*3/uL — ABNORMAL HIGH (ref 4.0–10.5)
nRBC: 0 % (ref 0.0–0.2)

## 2022-11-06 LAB — COMPREHENSIVE METABOLIC PANEL
ALT: 13 U/L (ref 0–44)
AST: 18 U/L (ref 15–41)
Albumin: 2.9 g/dL — ABNORMAL LOW (ref 3.5–5.0)
Alkaline Phosphatase: 55 U/L (ref 38–126)
Anion gap: 11 (ref 5–15)
BUN: <5 mg/dL — ABNORMAL LOW (ref 6–20)
CO2: 24 mmol/L (ref 22–32)
Calcium: 9.5 mg/dL (ref 8.9–10.3)
Chloride: 100 mmol/L (ref 98–111)
Creatinine, Ser: 0.43 mg/dL — ABNORMAL LOW (ref 0.44–1.00)
GFR, Estimated: >60 mL/min (ref >60)
Glucose, Bld: 88 mg/dL (ref 70–99)
Potassium: 3.6 mmol/L (ref 3.5–5.1)
Sodium: 135 mmol/L (ref 135–145)
Total Bilirubin: 0.6 mg/dL (ref 0.3–1.2)
Total Protein: 6.5 g/dL (ref 6.5–8.1)

## 2022-11-06 LAB — URINALYSIS, ROUTINE W REFLEX MICROSCOPIC
Bilirubin Urine: NEGATIVE
Glucose, UA: NEGATIVE mg/dL
Hgb urine dipstick: NEGATIVE
Ketones, ur: NEGATIVE mg/dL
Leukocytes,Ua: NEGATIVE
Nitrite: NEGATIVE
Protein, ur: NEGATIVE mg/dL
Specific Gravity, Urine: 1.004 — ABNORMAL LOW (ref 1.005–1.030)
pH: 7 (ref 5.0–8.0)

## 2022-11-06 LAB — GROUP A STREP BY PCR: Group A Strep by PCR: DETECTED — AB

## 2022-11-06 LAB — RESP PANEL BY RT-PCR (RSV, FLU A&B, COVID)  RVPGX2
Influenza A by PCR: NEGATIVE
Influenza B by PCR: NEGATIVE
Resp Syncytial Virus by PCR: NEGATIVE
SARS Coronavirus 2 by RT PCR: NEGATIVE

## 2022-11-06 MED ORDER — METOCLOPRAMIDE HCL 10 MG PO TABS: 10.0000 mg | ORAL_TABLET | Freq: Four times a day (QID) | ORAL | 0 refills | Status: DC

## 2022-11-06 MED ORDER — LACTATED RINGERS IV BOLUS
1000.0000 mL | Freq: Once | INTRAVENOUS | Status: AC
  Administered 2022-11-06: 1000 mL via INTRAVENOUS

## 2022-11-06 MED ORDER — METOCLOPRAMIDE HCL 5 MG/ML IJ SOLN
10.0000 mg | Freq: Once | INTRAMUSCULAR | Status: AC
  Administered 2022-11-06: 10 mg via INTRAVENOUS
  Filled 2022-11-06: qty 2

## 2022-11-06 MED ORDER — SCOPOLAMINE 1 MG/3DAYS TD PT72
1.0000 | MEDICATED_PATCH | TRANSDERMAL | Status: DC
  Administered 2022-11-06: 1.5 mg via TRANSDERMAL
  Filled 2022-11-06: qty 1

## 2022-11-06 MED ORDER — AMOXICILLIN 500 MG PO CAPS: 500.0000 mg | ORAL_CAPSULE | Freq: Two times a day (BID) | ORAL | 0 refills | Status: AC

## 2022-11-06 MED ORDER — KETOROLAC TROMETHAMINE 30 MG/ML IJ SOLN
30.0000 mg | Freq: Once | INTRAMUSCULAR | Status: AC
  Administered 2022-11-06: 30 mg via INTRAVENOUS
  Filled 2022-11-06: qty 1

## 2022-11-06 NOTE — Discharge Instructions (Signed)

## 2022-11-06 NOTE — MAU Provider Note (Signed)
History     CSN: [id number]  Arrival date and time: 11/06/22 1250   Event Date/Time   First Provider Initiated Contact with Patient 11/06/22 1530      Chief Complaint  Patient presents with   Fever   Sore Throat   Abdominal Pain   Back Pain   HPI  Shelly Koch is a 32 y.o. G3P2002 at [redacted]w[redacted]d who presents for evaluation of sore throat, fever and nausea and vomiting. Patient reports she has felt generally unwell for the last 2 days. She denies any sick contacts. She reports at home she had a fever of 102 and took tylenol which helped. She reports lower abdominal pain that she rates a 4/10 and has not tried anything for the pain.   She denies any vaginal bleeding, discharge, and leaking of fluid. Denies any constipation, diarrhea or any urinary complaints. Reports normal fetal movement.   OB History     Gravida  3   Para  2   Term  2   Preterm      AB      Living  2      SAB      IAB      Ectopic      Multiple  0   Live Births  2        Obstetric Comments  2015- started as twin preg, GDM         Past Medical History:  Diagnosis Date   Anemia    Asherman syndrome    Asthma    as child   Chronic bilateral low back pain without sciatica 11/23/2018   Gestational diabetes    first preg   Gestational trophoblastic neoplasm    Headache    Leukopenia due to antineoplastic chemotherapy    PICC (peripherally inserted central catheter) in place 12/24/2014   Pyelonephritis    UTI (urinary tract infection)     Past Surgical History:  Procedure Laterality Date   DILATION AND EVACUATION N/A 08/10/2014   Procedure: DILATATION AND EVACUATION;  Surgeon: Willodean Rosenthal, MD;  Location: [facility];  Service: Gynecology;  Laterality: N/A;   DILATION AND EVACUATION N/A 10/08/2017   Procedure: DILATATION AND EVACUATION;  Surgeon: Catalina Antigua, MD;  Location: [facility];  Service: Gynecology;  Laterality: N/A;   HYSTEROSCOPY N/A 08/10/2014   Procedure:  HYSTEROSCOPY;  Surgeon: Willodean Rosenthal, MD;  Location: [facility];  Service: Gynecology;  Laterality: N/A;    Family History  Problem Relation Age of Onset   Healthy Mother    Healthy Father    Alcohol abuse Neg Hx     Social History   Tobacco Use   Smoking status: Never   Smokeless tobacco: Never  Vaping Use   Vaping Use: Never used  Substance Use Topics   Alcohol use: No   Drug use: No    Allergies:  Allergies  Allergen Reactions   Okra     Hives and itching   Ondansetron Hives and Itching   Phenergan [Promethazine] Hives and Itching    Note: pt tolerates PO COMPAZINE   Zofran [Ondansetron Hcl] Rash    Medications Prior to Admission  Medication Sig Dispense Refill Last Dose   acetaminophen (TYLENOL) 500 MG tablet Take 500 mg by mouth every 6 (six) hours as needed.   11/06/2022 at 1130   Blood Pressure Monitoring DEVI 1 each by Does not apply route once a week. 1 each 0    cefadroxil (DURICEF) 500 MG capsule Take  1 capsule (500 mg total) by mouth 2 (two) times daily. 14 capsule 0    Doxylamine-Pyridoxine (DICLEGIS) 10-10 MG TBEC Take 2 tablets by mouth at bedtime. If symptoms persist, add one tablet in the morning and one in the afternoon 100 tablet 5    omeprazole (PRILOSEC) 20 MG capsule Take 1 capsule (20 mg total) by mouth daily. 30 capsule 3    Prenatal Vit w/Fe-Methylfol-FA (PNV-SELECT) 27-0.6-0.4 MG TABS Take 1 tablet by mouth daily. 30 tablet 12    prenatal vitamin w/FE, FA (PRENATAL 1 + 1) 27-1 MG TABS tablet Take 1 tablet by mouth daily at 12 noon.       Review of Systems  Constitutional:  Positive for fatigue and fever.  HENT:  Positive for sore throat.   Respiratory: Negative.  Negative for shortness of breath.   Cardiovascular: Negative.  Negative for chest pain.  Gastrointestinal:  Positive for abdominal pain, nausea and vomiting. Negative for constipation and diarrhea.  Genitourinary: Negative.  Negative for dysuria and vaginal discharge.   Musculoskeletal:  Positive for myalgias.  Neurological: Negative.  Negative for dizziness and headaches.   Physical Exam   Blood pressure 110/64, pulse (!) 121, temperature 98.4 F (36.9 C), temperature source Oral, resp. rate 20, last menstrual period 06/16/2022, SpO2 100 %.  Patient Vitals for the past 24 hrs:  BP Temp Temp src Pulse Resp SpO2  11/06/22 1532 110/64 -- -- (!) 121 -- --  11/06/22 1448 119/75 98.4 F (36.9 C) Oral (!) 116 20 100 %    Physical Exam Vitals and nursing note reviewed.  Constitutional:      General: She is not in acute distress.    Appearance: She is well-developed.  HENT:     Head: Normocephalic.  Eyes:     Pupils: Pupils are equal, round, and reactive to light.  Cardiovascular:     Rate and Rhythm: Normal rate and regular rhythm.     Heart sounds: Normal heart sounds.  Pulmonary:     Effort: Pulmonary effort is normal. No respiratory distress.     Breath sounds: Normal breath sounds.  Abdominal:     General: Bowel sounds are normal. There is no distension.     Palpations: Abdomen is soft.     Tenderness: There is no abdominal tenderness.  Skin:    General: Skin is warm and dry.  Neurological:     Mental Status: She is alert and oriented to person, place, and time.  Psychiatric:        Mood and Affect: Mood normal.        Behavior: Behavior normal.        Thought Content: Thought content normal.        Judgment: Judgment normal.     FHT: 154 bpm  Cervix: closed/thick/posterior  MAU Course  Procedures  Results for orders placed or performed during the hospital encounter of 11/06/22 (from the past 24 hour(s))  Urinalysis, Routine w reflex microscopic -Urine, Clean Catch     Status: Abnormal   Collection Time: 11/06/22  2:30 PM  Result Value Ref Range   Color, Urine STRAW (A) YELLOW   APPearance CLEAR CLEAR   Specific Gravity, Urine 1.004 (L) 1.005 - 1.030   pH 7.0 5.0 - 8.0   Glucose, UA NEGATIVE NEGATIVE mg/dL   Hgb urine  dipstick NEGATIVE NEGATIVE   Bilirubin Urine NEGATIVE NEGATIVE   Ketones, ur NEGATIVE NEGATIVE mg/dL   Protein, ur NEGATIVE NEGATIVE mg/dL   Nitrite NEGATIVE  NEGATIVE   Leukocytes,Ua NEGATIVE NEGATIVE  Resp panel by RT-PCR (RSV, Flu A&B, Covid) Anterior Nasal Swab     Status: None   Collection Time: 11/06/22  3:16 PM   Specimen: Anterior Nasal Swab  Result Value Ref Range   SARS Coronavirus 2 by RT PCR NEGATIVE NEGATIVE   Influenza A by PCR NEGATIVE NEGATIVE   Influenza B by PCR NEGATIVE NEGATIVE   Resp Syncytial Virus by PCR NEGATIVE NEGATIVE  Group A Strep by PCR     Status: Abnormal   Collection Time: 11/06/22  3:16 PM   Specimen: Anterior Nasal Swab; Sterile Swab  Result Value Ref Range   Group A Strep by PCR DETECTED (A) NOT DETECTED  CBC     Status: Abnormal   Collection Time: 11/06/22  4:33 PM  Result Value Ref Range   WBC 15.6 (H) 4.0 - 10.5 K/uL   RBC 3.11 (L) 3.87 - 5.11 MIL/uL   Hemoglobin 10.1 (L) 12.0 - 15.0 g/dL   HCT 16.1 (L) 09.6 - 04.5 %   MCV 98.1 80.0 - 100.0 fL   MCH 32.5 26.0 - 34.0 pg   MCHC 33.1 30.0 - 36.0 g/dL   RDW 40.9 81.1 - 91.4 %   Platelets 161 150 - 400 K/uL   nRBC 0.0 0.0 - 0.2 %    MDM Labs ordered and reviewed.   UA CBC, CMP Resp Panel Group A strep  LR bolus Scop patch Reglan Toradol IV  Patient reports resolution of pain and nausea. No episodes of vomiting in MAU  Reviewed positive strep test and recommendations to prevent spread at home. RX sent to pharmacy for treatment.   Assessment and Plan   1. Strep throat   2. [redacted] weeks gestation of pregnancy     -Discharge home in stable condition -Rx for amoxicillin and antiemetics sent to pharmacy -Second trimester precautions discussed -Patient advised to follow-up with OB as scheduled for prenatal care -Patient may return to MAU as needed or if her condition were to change or worsen  Rolm Bookbinder, CNM 11/06/2022, 3:30 PM

## 2022-11-06 NOTE — MAU Note (Signed)
Called Main Lab and requested blood to be ran.

## 2022-11-06 NOTE — MAU Note (Signed)
Shelly Koch is a 32 y.o. at [redacted]w[redacted]d here in MAU reporting: she has lower abdominal and back pain as well as sore throat and fever.  States symptoms began yesterday, temp yesterday was 100.6 and earlier today 102.4. Reports took Tylenol @ 1130.  Denies VB or LOF.  Reports +FM LMP: NA Onset of complaint: yesterday Pain score: 7 Vitals:   11/06/22 1448  BP: 119/75  Pulse: (!) 116  Resp: 20  Temp: 98.4 F (36.9 C)  SpO2: 100%     FHT:154 bpm Lab orders placed from triage:   UA

## 2022-11-11 ENCOUNTER — Other Ambulatory Visit: Payer: Self-pay | Admitting: *Deleted

## 2022-11-11 ENCOUNTER — Ambulatory Visit: Payer: Medicaid Other | Attending: Advanced Practice Midwife

## 2022-11-11 ENCOUNTER — Encounter: Payer: Self-pay | Admitting: *Deleted

## 2022-11-11 ENCOUNTER — Ambulatory Visit: Payer: Medicaid Other | Admitting: *Deleted

## 2022-11-11 VITALS — BP 115/61 | HR 99

## 2022-11-11 DIAGNOSIS — O0990 Supervision of high risk pregnancy, unspecified, unspecified trimester: Secondary | ICD-10-CM

## 2022-11-11 DIAGNOSIS — Z3480 Encounter for supervision of other normal pregnancy, unspecified trimester: Secondary | ICD-10-CM | POA: Diagnosis not present

## 2022-11-11 DIAGNOSIS — Z363 Encounter for antenatal screening for malformations: Secondary | ICD-10-CM | POA: Diagnosis present

## 2022-11-11 DIAGNOSIS — Z8632 Personal history of gestational diabetes: Secondary | ICD-10-CM | POA: Insufficient documentation

## 2022-11-11 DIAGNOSIS — O358XX0 Maternal care for other (suspected) fetal abnormality and damage, not applicable or unspecified: Secondary | ICD-10-CM | POA: Insufficient documentation

## 2022-11-11 DIAGNOSIS — Z362 Encounter for other antenatal screening follow-up: Secondary | ICD-10-CM

## 2022-11-11 DIAGNOSIS — Z3A19 19 weeks gestation of pregnancy: Secondary | ICD-10-CM | POA: Insufficient documentation

## 2022-11-11 DIAGNOSIS — Z8759 Personal history of other complications of pregnancy, childbirth and the puerperium: Secondary | ICD-10-CM

## 2022-11-11 DIAGNOSIS — O321XX0 Maternal care for breech presentation, not applicable or unspecified: Secondary | ICD-10-CM | POA: Insufficient documentation

## 2022-11-11 DIAGNOSIS — O09299 Supervision of pregnancy with other poor reproductive or obstetric history, unspecified trimester: Secondary | ICD-10-CM | POA: Insufficient documentation

## 2022-11-21 ENCOUNTER — Encounter (HOSPITAL_COMMUNITY): Payer: Self-pay | Admitting: Obstetrics & Gynecology

## 2022-11-21 ENCOUNTER — Inpatient Hospital Stay (HOSPITAL_COMMUNITY)
Admission: AD | Admit: 2022-11-21 | Discharge: 2022-11-21 | Disposition: A | Discharge status: Home / Self Care | Payer: Medicaid Other | Attending: Obstetrics & Gynecology | Admitting: Obstetrics & Gynecology

## 2022-11-21 ENCOUNTER — Other Ambulatory Visit: Payer: Self-pay

## 2022-11-21 DIAGNOSIS — O99012 Anemia complicating pregnancy, second trimester: Secondary | ICD-10-CM

## 2022-11-21 DIAGNOSIS — R519 Headache, unspecified: Secondary | ICD-10-CM | POA: Diagnosis not present

## 2022-11-21 DIAGNOSIS — O26892 Other specified pregnancy related conditions, second trimester: Secondary | ICD-10-CM | POA: Insufficient documentation

## 2022-11-21 DIAGNOSIS — Z87440 Personal history of urinary (tract) infections: Secondary | ICD-10-CM | POA: Diagnosis not present

## 2022-11-21 DIAGNOSIS — Z3A21 21 weeks gestation of pregnancy: Secondary | ICD-10-CM | POA: Diagnosis not present

## 2022-11-21 DIAGNOSIS — R55 Syncope and collapse: Secondary | ICD-10-CM | POA: Diagnosis not present

## 2022-11-21 DIAGNOSIS — O0990 Supervision of high risk pregnancy, unspecified, unspecified trimester: Secondary | ICD-10-CM

## 2022-11-21 LAB — URINALYSIS, ROUTINE W REFLEX MICROSCOPIC
Bilirubin Urine: NEGATIVE
Glucose, UA: NEGATIVE mg/dL
Hgb urine dipstick: NEGATIVE
Ketones, ur: NEGATIVE mg/dL
Leukocytes,Ua: NEGATIVE
Nitrite: NEGATIVE
Protein, ur: NEGATIVE mg/dL
Specific Gravity, Urine: 1 — ABNORMAL LOW (ref 1.005–1.030)
pH: 7 (ref 5.0–8.0)

## 2022-11-21 LAB — CBC WITH DIFFERENTIAL/PLATELET
Abs Immature Granulocytes: 0.2 10*3/uL — ABNORMAL HIGH (ref 0.00–0.07)
Basophils Absolute: 0 10*3/uL (ref 0.0–0.1)
Basophils Relative: 0 %
Eosinophils Absolute: 0.2 10*3/uL (ref 0.0–0.5)
Eosinophils Relative: 1 %
HCT: 31.1 % — ABNORMAL LOW (ref 36.0–46.0)
Hemoglobin: 10 g/dL — ABNORMAL LOW (ref 12.0–15.0)
Immature Granulocytes: 2 %
Lymphocytes Relative: 27 %
Lymphs Abs: 2.9 10*3/uL (ref 0.7–4.0)
MCH: 32.1 pg (ref 26.0–34.0)
MCHC: 32.2 g/dL (ref 30.0–36.0)
MCV: 99.7 fL (ref 80.0–100.0)
Monocytes Absolute: 0.7 10*3/uL (ref 0.1–1.0)
Monocytes Relative: 6 %
Neutro Abs: 6.9 10*3/uL (ref 1.7–7.7)
Neutrophils Relative %: 64 %
Platelets: 176 10*3/uL (ref 150–400)
RBC: 3.12 MIL/uL — ABNORMAL LOW (ref 3.87–5.11)
RDW: 12.6 % (ref 11.5–15.5)
WBC: 10.9 10*3/uL — ABNORMAL HIGH (ref 4.0–10.5)
nRBC: 0 % (ref 0.0–0.2)

## 2022-11-21 LAB — BASIC METABOLIC PANEL
Anion gap: 10 (ref 5–15)
BUN: <5 mg/dL — ABNORMAL LOW (ref 6–20)
CO2: 22 mmol/L (ref 22–32)
Calcium: 9 mg/dL (ref 8.9–10.3)
Chloride: 103 mmol/L (ref 98–111)
Creatinine, Ser: 0.36 mg/dL — ABNORMAL LOW (ref 0.44–1.00)
GFR, Estimated: >60 mL/min (ref >60)
Glucose, Bld: 87 mg/dL (ref 70–99)
Potassium: 3.5 mmol/L (ref 3.5–5.1)
Sodium: 135 mmol/L (ref 135–145)

## 2022-11-21 LAB — GLUCOSE, CAPILLARY: Glucose-Capillary: 92 mg/dL (ref 70–99)

## 2022-11-21 MED ORDER — FERROUS SULFATE 325 (65 FE) MG PO TBEC: 325.0000 mg | DELAYED_RELEASE_TABLET | ORAL | 1 refills | Status: DC

## 2022-11-21 MED ORDER — ACETAMINOPHEN 325 MG PO TABS
650.0000 mg | ORAL_TABLET | Freq: Once | ORAL | Status: AC
  Administered 2022-11-21: 650 mg via ORAL
  Filled 2022-11-21: qty 2

## 2022-11-21 NOTE — MAU Note (Signed)
Main Lab reached at this time, labs in process at this time.

## 2022-11-21 NOTE — MAU Note (Signed)
Shelly Koch is a 32 y.o. at [redacted]w[redacted]d here in MAU reporting: she passed out while at work today at approximately 1300.  States she was sitting and didn't fall.  Reports she doesn't recall hitting abdomen.  Denies VB or LOF.  Reports +FM. LMP: NA Onset of complaint: today Pain score: 7 Vitals:   11/21/22 1527  BP: 112/69  Pulse: 94  Resp: 18  Temp: 98 F (36.7 C)  SpO2: 100%     FHT:169 bpm Lab orders placed from triage:   UA

## 2022-11-21 NOTE — MAU Provider Note (Signed)
History     CSN: [id number]  Arrival date and time: 11/21/22 1444   Event Date/Time   First Provider Initiated Contact with Patient 11/21/22 1628      Chief Complaint  Patient presents with   Dizzy   Syncope    Shelly Koch is a 32 y.o. G3P2002 at [redacted]w[redacted]d who receives care at [facility].  She presents today for syncope.  She states she was working and passed out.  She reports that she loss consciousness for "a couple of minutes," but is unsure of how long.  She states she felt hot and dizzy prior to the incident.  She reports abdominal pain that she describes as sharp, but is unsure of where it is located.  She goes on to state it is on her left side just below her belly button. She states the pain is intermittent and rates it a 7/10.  She states it has no improving or worsening factors.  She also reports HA that is located in the frontal area that is worse with sitting up and improved with laying down. She rates the pain a 7-8/10.  She reports history of migraines. She endorses fetal movement and denies vaginal concerns.   [employer]: 4 bottles thus far  OB History     Gravida  3   Para  2   Term  2   Preterm      AB      Living  2      SAB      IAB      Ectopic      Multiple  0   Live Births  2        Obstetric Comments  2015- started as twin preg, GDM         Past Medical History:  Diagnosis Date   Anemia    Asherman syndrome    Asthma    as child   Chronic bilateral low back pain without sciatica 11/23/2018   Gestational diabetes    first preg   Gestational trophoblastic neoplasm    Headache    Leukopenia due to antineoplastic chemotherapy (HCC)    PICC (peripherally inserted central catheter) in place 12/24/2014   Pyelonephritis    UTI (urinary tract infection)     Past Surgical History:  Procedure Laterality Date   DILATION AND EVACUATION N/A 08/10/2014   Procedure: DILATATION AND EVACUATION;   Surgeon: Willodean Rosenthal, MD;  Location: [facility];  Service: Gynecology;  Laterality: N/A;   DILATION AND EVACUATION N/A 10/08/2017   Procedure: DILATATION AND EVACUATION;  Surgeon: Catalina Antigua, MD;  Location: [facility];  Service: Gynecology;  Laterality: N/A;   HYSTEROSCOPY N/A 08/10/2014   Procedure: HYSTEROSCOPY;  Surgeon: Willodean Rosenthal, MD;  Location: [facility];  Service: Gynecology;  Laterality: N/A;    Family History  Problem Relation Age of Onset   Healthy Mother    Healthy Father    Alcohol abuse Neg Hx     Social History   Tobacco Use   Smoking status: Never   Smokeless tobacco: Never  Vaping Use   Vaping Use: Never used  Substance Use Topics   Alcohol use: No   Drug use: No    Allergies:  Allergies  Allergen Reactions   Okra     Hives and itching   Ondansetron Hives and Itching   Phenergan [Promethazine] Hives and Itching    Note: pt tolerates PO COMPAZINE   Zofran [place]  Hcl] Rash    Medications Prior to Admission  Medication Sig Dispense Refill Last Dose   Prenatal Vit w/Fe-Methylfol-FA (PNV-SELECT) 27-0.6-0.4 MG TABS Take 1 tablet by mouth daily. 30 tablet 12 11/20/2022   acetaminophen (TYLENOL) 500 MG tablet Take 500 mg by mouth every 6 (six) hours as needed.      Blood Pressure Monitoring DEVI 1 each by Does not apply route once a week. 1 each 0    Doxylamine-Pyridoxine (DICLEGIS) 10-10 MG TBEC Take 2 tablets by mouth at bedtime. If symptoms persist, add one tablet in the morning and one in the afternoon (Patient not taking: Reported on 11/11/2022) 100 tablet 5    metoCLOPramide (REGLAN) 10 MG tablet Take 1 tablet (10 mg total) by mouth every 6 (six) hours. (Patient not taking: Reported on 11/11/2022) 30 tablet 0    omeprazole (PRILOSEC) 20 MG capsule Take 1 capsule (20 mg total) by mouth daily. (Patient not taking: Reported on 11/11/2022) 30 capsule 3    prenatal vitamin w/FE, FA (PRENATAL 1 + 1) 27-1 MG TABS tablet Take 1 tablet by mouth daily  at 12 noon.       Review of Systems  Gastrointestinal:  Positive for abdominal pain. Negative for constipation, diarrhea, nausea and vomiting.  Genitourinary:  Positive for dysuria (Burning since yesterday). Negative for difficulty urinating, vaginal bleeding and vaginal discharge.  Neurological:  Positive for dizziness, light-headedness and headaches.   Physical Exam   Blood pressure 112/69, pulse 94, temperature 98 F (36.7 C), temperature source Oral, resp. rate 18, last menstrual period 06/16/2022, SpO2 100 %.  Physical Exam Vitals reviewed.  Constitutional:      Appearance: Normal appearance.  HENT:     Head: Normocephalic and atraumatic.  Eyes:     Conjunctiva/sclera: Conjunctivae normal.  Cardiovascular:     Rate and Rhythm: Normal rate.  Pulmonary:     Effort: Pulmonary effort is normal. No respiratory distress.  Abdominal:     Palpations: Abdomen is soft.     Tenderness: There is no abdominal tenderness.  Musculoskeletal:     Cervical back: Normal range of motion.  Skin:    General: Skin is warm and dry.  Neurological:     Mental Status: She is alert and oriented to person, place, and time.  Psychiatric:        Mood and Affect: Mood normal.        Behavior: Behavior normal.    MAU Course  Procedures Results for orders placed or performed during the hospital encounter of 11/21/22 (from the past 24 hour(s))  Glucose, capillary     Status: None   Collection Time: 11/21/22  4:20 PM  Result Value Ref Range   Glucose-Capillary 92 70 - 99 mg/dL   Comment 1 Notify RN    Comment 2 Document in Chart   Urinalysis, Routine w reflex microscopic -Urine, Clean Catch     Status: Abnormal   Collection Time: 11/21/22  5:05 PM  Result Value Ref Range   Color, Urine COLORLESS (A) YELLOW   APPearance CLEAR CLEAR   Specific Gravity, Urine 1.000 (L) 1.005 - 1.030   pH 7.0 5.0 - 8.0   Glucose, UA NEGATIVE NEGATIVE mg/dL   Hgb urine dipstick NEGATIVE NEGATIVE   Bilirubin  Urine NEGATIVE NEGATIVE   Ketones, ur NEGATIVE NEGATIVE mg/dL   Protein, ur NEGATIVE NEGATIVE mg/dL   Nitrite NEGATIVE NEGATIVE   Leukocytes,Ua NEGATIVE NEGATIVE    MDM EKG Labs: CBC/D, BMP, CBG Pain Medication Assessment and  Plan  32 year old, G3P2002  SIUP at 21.1 weeks Syncope Episode Headache  -EKG ordered and completed. Results reviewed by Dr. Otelia Limes who notes normal findings.  -Reviewed POC with patient. -Exam performed and findings discussed.  -Pain medication offered for HA and patient declines Excedrin. However patient agreeable to tylenol dosing. -Discussed protein rich diet as well as increasing potassium with c/o leg cramps.  -Reviewed EKG findings and CBG. -Will collect CBC and BMP to evaluate. -Discussed known anemia and will start on iron supplement. -Patient agreeable. -Await results.   Cherre Robins 11/21/2022, 4:28 PM   Reassessment (6:38 PM) -Results as above. -No significant findings -Discharge to home with iron supplement prescription. -Information for iron and potassium rich intake placed in AVS. -Encouraged to call primary office or return to MAU if symptoms worsen or with the onset of new symptoms. -Discharged to home in stable condition.  Cherre Robins MSN, CNM Advanced Practice Provider, [facility] [employer]

## 2022-11-21 NOTE — MAU Note (Signed)
This RN attempted x2 to call Main Lab for update on lab work. Lab did not pick up at this time.

## 2022-11-24 NOTE — Progress Notes (Unsigned)
       PRENATAL VISIT NOTE- Centering Pregnancy Cycle {NUMBERS:20191}, Session # {NUMBERS:20191}  Subjective:  Shelly Koch is a 32 y.o. G3P2002 at [redacted]w[redacted]d being seen today for ongoing prenatal care through Centering Pregnancy.  She is currently monitored for the following issues for this {Blank single:19197::"high-risk","low-risk"} pregnancy and has History of poor fetal growth; Gestational trophoblastic neoplasm; History of gestational hypertension; Decreased hearing of both ears; Asherman's syndrome; Supervision of high risk pregnancy, antepartum; History of pyelonephritis; History of gestational diabetes mellitus (GDM) in prior pregnancy, currently pregnant; and Anemia during pregnancy in second trimester on their problem list.  Patient reports {sx:14538}.   .  .   . ***Denies leaking of fluid/ROM.   The following portions of the patient's history were reviewed and updated as appropriate: allergies, current medications, past family history, past medical history, past social history, past surgical history and problem list. Problem list updated.  Objective:  There were no vitals filed for this visit.  Fetal Status:           General:  Alert, oriented and cooperative. Patient is in no acute distress.  Skin: Skin is warm and dry. No rash noted.   Cardiovascular: Normal heart rate noted  Respiratory: Normal respiratory effort, no problems with respiration noted  Abdomen: Soft, gravid, appropriate for gestational age.        Pelvic: {Blank single:19197::"Cervical exam performed","Cervical exam deferred"}        Extremities: Normal range of motion.     Mental Status: Normal mood and affect. Normal behavior. Normal judgment and thought content.   Assessment and Plan:  Pregnancy: G3P2002 at [redacted]w[redacted]d  1. Supervision of other normal pregnancy, antepartum ***  2. History of gestational trophoblastic disease ***  3. Syncope, unspecified syncope type ***  4. [redacted] weeks gestation of  pregnancy ***   *** add [facility] dot phrase for session   {Blank single:19197::"Term","Preterm"} labor symptoms and general obstetric precautions including but not limited to vaginal bleeding, contractions, leaking of fluid and fetal movement were reviewed in detail with the patient. Please refer to After Visit Summary for other counseling recommendations.  No follow-ups on file.  Future Appointments  Date Time Provider Department Center  11/25/2022  9:00 AM CENTERING PROVIDER [facility]  12/17/2022 10:30 AM [facility] NURSE [facility]  12/17/2022 10:45 AM [facility] US6 [facility]  12/23/2022  9:00 AM CENTERING PROVIDER [facility]  01/06/2023  9:00 AM CENTERING PROVIDER [facility]  01/20/2023  9:00 AM CENTERING PROVIDER [facility]  02/03/2023  9:00 AM CENTERING PROVIDER [facility]  02/17/2023  9:00 AM CENTERING PROVIDER [facility]  03/03/2023  9:00 AM CENTERING PROVIDER [facility]  03/17/2023  9:00 AM CENTERING PROVIDER [facility]  03/31/2023  9:00 AM CENTERING PROVIDER [facility]    Sharen Counter, CNM

## 2022-11-25 ENCOUNTER — Ambulatory Visit: Payer: Medicaid Other | Admitting: Advanced Practice Midwife

## 2022-11-25 VITALS — BP 106/69 | HR 101 | Wt 112.2 lb

## 2022-11-25 DIAGNOSIS — Z3A21 21 weeks gestation of pregnancy: Secondary | ICD-10-CM

## 2022-11-25 DIAGNOSIS — Z3480 Encounter for supervision of other normal pregnancy, unspecified trimester: Secondary | ICD-10-CM

## 2022-11-25 DIAGNOSIS — Z8759 Personal history of other complications of pregnancy, childbirth and the puerperium: Secondary | ICD-10-CM

## 2022-11-25 DIAGNOSIS — R55 Syncope and collapse: Secondary | ICD-10-CM

## 2022-12-17 ENCOUNTER — Ambulatory Visit: Payer: Medicaid Other | Attending: Obstetrics

## 2022-12-17 ENCOUNTER — Other Ambulatory Visit: Payer: Self-pay | Admitting: *Deleted

## 2022-12-17 ENCOUNTER — Ambulatory Visit: Payer: Medicaid Other | Admitting: *Deleted

## 2022-12-17 VITALS — BP 110/73 | HR 100

## 2022-12-17 DIAGNOSIS — Z8632 Personal history of gestational diabetes: Secondary | ICD-10-CM | POA: Insufficient documentation

## 2022-12-17 DIAGNOSIS — O09299 Supervision of pregnancy with other poor reproductive or obstetric history, unspecified trimester: Secondary | ICD-10-CM | POA: Insufficient documentation

## 2022-12-17 DIAGNOSIS — O99012 Anemia complicating pregnancy, second trimester: Secondary | ICD-10-CM

## 2022-12-17 DIAGNOSIS — O0990 Supervision of high risk pregnancy, unspecified, unspecified trimester: Secondary | ICD-10-CM | POA: Diagnosis present

## 2022-12-17 DIAGNOSIS — Z3A24 24 weeks gestation of pregnancy: Secondary | ICD-10-CM | POA: Diagnosis not present

## 2022-12-17 DIAGNOSIS — D649 Anemia, unspecified: Secondary | ICD-10-CM | POA: Diagnosis not present

## 2022-12-17 DIAGNOSIS — Z362 Encounter for other antenatal screening follow-up: Secondary | ICD-10-CM | POA: Insufficient documentation

## 2022-12-17 DIAGNOSIS — Z8759 Personal history of other complications of pregnancy, childbirth and the puerperium: Secondary | ICD-10-CM

## 2022-12-17 DIAGNOSIS — Z3689 Encounter for other specified antenatal screening: Secondary | ICD-10-CM

## 2022-12-17 DIAGNOSIS — O09292 Supervision of pregnancy with other poor reproductive or obstetric history, second trimester: Secondary | ICD-10-CM | POA: Diagnosis not present

## 2022-12-20 ENCOUNTER — Other Ambulatory Visit: Payer: Self-pay

## 2022-12-20 ENCOUNTER — Inpatient Hospital Stay (HOSPITAL_COMMUNITY)
Admission: AD | Admit: 2022-12-20 | Discharge: 2022-12-20 | Disposition: A | Discharge status: Home / Self Care | Payer: Medicaid Other | Attending: Obstetrics and Gynecology | Admitting: Obstetrics and Gynecology

## 2022-12-20 ENCOUNTER — Encounter (HOSPITAL_COMMUNITY): Payer: Self-pay | Admitting: Obstetrics and Gynecology

## 2022-12-20 DIAGNOSIS — O0990 Supervision of high risk pregnancy, unspecified, unspecified trimester: Secondary | ICD-10-CM

## 2022-12-20 DIAGNOSIS — R102 Pelvic and perineal pain: Secondary | ICD-10-CM | POA: Insufficient documentation

## 2022-12-20 DIAGNOSIS — R350 Frequency of micturition: Secondary | ICD-10-CM | POA: Diagnosis not present

## 2022-12-20 DIAGNOSIS — O99891 Other specified diseases and conditions complicating pregnancy: Secondary | ICD-10-CM

## 2022-12-20 DIAGNOSIS — Z3A25 25 weeks gestation of pregnancy: Secondary | ICD-10-CM | POA: Diagnosis not present

## 2022-12-20 DIAGNOSIS — O26892 Other specified pregnancy related conditions, second trimester: Secondary | ICD-10-CM | POA: Diagnosis present

## 2022-12-20 DIAGNOSIS — O0992 Supervision of high risk pregnancy, unspecified, second trimester: Secondary | ICD-10-CM | POA: Insufficient documentation

## 2022-12-20 DIAGNOSIS — M549 Dorsalgia, unspecified: Secondary | ICD-10-CM

## 2022-12-20 LAB — URINALYSIS, ROUTINE W REFLEX MICROSCOPIC
Bilirubin Urine: NEGATIVE
Glucose, UA: NEGATIVE mg/dL
Hgb urine dipstick: NEGATIVE
Ketones, ur: NEGATIVE mg/dL
Leukocytes,Ua: NEGATIVE
Nitrite: NEGATIVE
Protein, ur: NEGATIVE mg/dL
Specific Gravity, Urine: 1.004 — ABNORMAL LOW (ref 1.005–1.030)
pH: 7 (ref 5.0–8.0)

## 2022-12-20 LAB — WET PREP, GENITAL
Clue Cells Wet Prep HPF POC: NONE SEEN
Sperm: NONE SEEN
Trich, Wet Prep: NONE SEEN
WBC, Wet Prep HPF POC: <10 (ref <10)
Yeast Wet Prep HPF POC: NONE SEEN

## 2022-12-20 NOTE — MAU Provider Note (Signed)
History     CSN: [id number]  Arrival date and time: 12/20/22 1130   None     Chief Complaint  Patient presents with   Back Pain   Contractions   HPI Shelly Koch is a 32 y.o. G3P2002 at [redacted]w[redacted]d who presents to MAU for back pain, possible contractions, and pelvic pressure. She reports contractions and pelvic pressure started yesterday and back pain started today. She has not timed the contractions. Back pain is constant. She denies leaking fluid, vaginal bleeding, itching/odor. She reports her urine smells bad and she has had an increase in frequency and urinating only small amounts, but no urgency, hematuria, or dysuria. No fever or chills. She is having normal BM's. No recent intercourse. She reports normal fetal movement.   Patient receives [facility] ([facility]), next appointment is on Monday 6/3.    OB History     Gravida  3   Para  2   Term  2   Preterm      AB      Living  2      SAB      IAB      Ectopic      Multiple  0   Live Births  2        Obstetric Comments  2015- started as twin preg, GDM         Past Medical History:  Diagnosis Date   Anemia    Asherman syndrome    Asthma    as child   Chronic bilateral low back pain without sciatica 11/23/2018   Gestational diabetes    first preg   Gestational trophoblastic neoplasm    Headache    Leukopenia due to antineoplastic chemotherapy (HCC)    PICC (peripherally inserted central catheter) in place 12/24/2014   Pyelonephritis    UTI (urinary tract infection)     Past Surgical History:  Procedure Laterality Date   DILATION AND EVACUATION N/A 08/10/2014   Procedure: DILATATION AND EVACUATION;  Surgeon: Willodean Rosenthal, MD;  Location: [facility];  Service: Gynecology;  Laterality: N/A;   DILATION AND EVACUATION N/A 10/08/2017   Procedure: DILATATION AND EVACUATION;  Surgeon: Catalina Antigua, MD;  Location: [facility];  Service: Gynecology;  Laterality: N/A;   HYSTEROSCOPY N/A 08/10/2014    Procedure: HYSTEROSCOPY;  Surgeon: Willodean Rosenthal, MD;  Location: [facility];  Service: Gynecology;  Laterality: N/A;    Family History  Problem Relation Age of Onset   Healthy Mother    Healthy Father    Alcohol abuse Neg Hx    Asthma Neg Hx    Cancer Neg Hx    Diabetes Neg Hx    Heart disease Neg Hx    Hypertension Neg Hx     Social History   Tobacco Use   Smoking status: Never   Smokeless tobacco: Never  Vaping Use   Vaping Use: Never used  Substance Use Topics   Alcohol use: Never   Drug use: Never    Allergies:  Allergies  Allergen Reactions   Okra Itching    Hives and itching   Ondansetron Hives and Itching   Phenergan [Promethazine] Hives and Itching    Note: pt tolerates PO COMPAZINE   Zofran [Ondansetron Hcl] Rash    No medications prior to admission.   Review of Systems  Constitutional: Negative.   Gastrointestinal: Negative.   Genitourinary:  Positive for frequency and pelvic pain.  Musculoskeletal:  Positive for back pain.  All other systems  reviewed and are negative.  Physical Exam   Blood pressure 100/69, pulse 96, temperature 98 F (36.7 C), temperature source Oral, resp. rate 18, height 4\' 10"  (1.473 m), weight 51.4 kg, last menstrual period 06/16/2022, SpO2 100 %.  Physical Exam Vitals and nursing note reviewed. Exam conducted with a chaperone present.  Constitutional:      General: She is not in acute distress. Eyes:     Extraocular Movements: Extraocular movements intact.     Pupils: Pupils are equal, round, and reactive to light.  Cardiovascular:     Rate and Rhythm: Normal rate.  Pulmonary:     Effort: Pulmonary effort is normal. No respiratory distress.  Abdominal:     Palpations: Abdomen is soft.     Tenderness: There is abdominal tenderness in the suprapubic area. There is no right CVA tenderness or left CVA tenderness.     Comments: Gravid   Musculoskeletal:        General: Normal range of motion.     Cervical back:  Normal range of motion.  Skin:    General: Skin is warm and dry.  Neurological:     General: No focal deficit present.     Mental Status: She is alert and oriented to person, place, and time.  Psychiatric:        Mood and Affect: Mood normal.        Behavior: Behavior normal.   Dilation: Closed Effacement (%): Thick Cervical Position: Posterior Station:  (high) Exam by:: Lodema Hong, CNM   Results for orders placed or performed during the hospital encounter of 12/20/22 (from the past 24 hour(s))  Urinalysis, Routine w reflex microscopic -Urine, Clean Catch     Status: Abnormal   Collection Time: 12/20/22 11:55 AM  Result Value Ref Range   Color, Urine STRAW (A) YELLOW   APPearance CLEAR CLEAR   Specific Gravity, Urine 1.004 (L) 1.005 - 1.030   pH 7.0 5.0 - 8.0   Glucose, UA NEGATIVE NEGATIVE mg/dL   Hgb urine dipstick NEGATIVE NEGATIVE   Bilirubin Urine NEGATIVE NEGATIVE   Ketones, ur NEGATIVE NEGATIVE mg/dL   Protein, ur NEGATIVE NEGATIVE mg/dL   Nitrite NEGATIVE NEGATIVE   Leukocytes,Ua NEGATIVE NEGATIVE  Wet prep, genital     Status: None   Collection Time: 12/20/22 12:40 PM   Specimen: Vaginal  Result Value Ref Range   Yeast Wet Prep HPF POC NONE SEEN NONE SEEN   Trich, Wet Prep NONE SEEN NONE SEEN   Clue Cells Wet Prep HPF POC NONE SEEN NONE SEEN   WBC, Wet Prep HPF POC <10 <10   Sperm NONE SEEN    NST FHR: 145 bpm, moderate variability, +15x15 accels, +occasional variable (appropriate for gestational age) Toco: occasional  MAU Course  Procedures  MDM UA, culture pending Wet prep, GC/CT NST  UA negative, however given patient's symptoms and suprapubic tenderness, will add urine culture. NST reassuring for gestational age. Toco with few occasional contractions. Patient was offered Tylenol/Flexeril but declines. She does accept a heating pad.   On reassessment, patient noted to be sleeping. She reports heating pad helped with pain. Wet prep negative, GC/CT  pending.   Assessment and Plan   1. Supervision of high risk pregnancy, antepartum   2. [redacted] weeks gestation of pregnancy   3. Back pain affecting pregnancy in second trimester   4. Pelvic pain affecting pregnancy in second trimester, antepartum   5. Urinary frequency    - Discharge home in stable condition -  Return precautions given. Return to MAU as needed - Keep OB appointment as scheduled on Monday 6/3  Brand Males, CNM 12/20/2022, 1:53 PM

## 2022-12-20 NOTE — MAU Note (Addendum)
Denies pain with urination.  Asked about using restroom again, this would be 3rd time since she was triaged.  (<20 min) States feeling urge and pressure, but not going much when she goes. Hx of UA and pyelo. Urine sent to lab.  Pt just notified  me that her daughter had crawler into bed at 0400 yesterday morning, when she got into bed, she banged her head back into the patients abd.  It hurt so hard she didn't go to work yesterday or today.

## 2022-12-20 NOTE — MAU Note (Signed)
Shelly Koch is a 32 y.o. at [redacted]w[redacted]d here in MAU reporting: she's having ctxs and lower back pain that began yesterday.  Reports back pain is intermittent and sharp.  Also c/o pelvic pressure.  Denies VB or LOF.  Endorses +FM. LMP: NA Onset of complaint: yesterday Pain score: 7 Vitals:   12/20/22 1143  BP: 100/69  Pulse: 96  Resp: 18  Temp: 98 F (36.7 C)  SpO2: 100%     FHT: 151 bpm Lab orders placed from triage:   UA

## 2022-12-21 LAB — CULTURE, OB URINE: Culture: NO GROWTH

## 2022-12-23 ENCOUNTER — Other Ambulatory Visit: Payer: Self-pay

## 2022-12-23 ENCOUNTER — Ambulatory Visit (INDEPENDENT_AMBULATORY_CARE_PROVIDER_SITE_OTHER): Payer: Medicaid Other | Admitting: Advanced Practice Midwife

## 2022-12-23 VITALS — BP 113/75 | HR 112 | Wt 117.0 lb

## 2022-12-23 DIAGNOSIS — O26893 Other specified pregnancy related conditions, third trimester: Secondary | ICD-10-CM

## 2022-12-23 DIAGNOSIS — O99012 Anemia complicating pregnancy, second trimester: Secondary | ICD-10-CM

## 2022-12-23 DIAGNOSIS — R55 Syncope and collapse: Secondary | ICD-10-CM

## 2022-12-23 DIAGNOSIS — Z3A26 26 weeks gestation of pregnancy: Secondary | ICD-10-CM

## 2022-12-23 DIAGNOSIS — R12 Heartburn: Secondary | ICD-10-CM

## 2022-12-23 DIAGNOSIS — Z3480 Encounter for supervision of other normal pregnancy, unspecified trimester: Secondary | ICD-10-CM

## 2022-12-23 DIAGNOSIS — Z8759 Personal history of other complications of pregnancy, childbirth and the puerperium: Secondary | ICD-10-CM

## 2022-12-23 LAB — GC/CHLAMYDIA PROBE AMP (~~LOC~~) NOT AT ARMC
Chlamydia: NEGATIVE
Comment: NEGATIVE
Comment: NORMAL
Neisseria Gonorrhea: NEGATIVE

## 2022-12-29 MED ORDER — OMEPRAZOLE 40 MG PO CPDR: 40.0000 mg | DELAYED_RELEASE_CAPSULE | Freq: Every day | ORAL | 5 refills | Status: DC

## 2022-12-29 NOTE — Progress Notes (Unsigned)
       PRENATAL VISIT NOTE- [facility] Pregnancy Cycle 12, Session # 3  Subjective:  Shelly Koch is a 32 y.o. G3P2002 at [redacted]w[redacted]d being seen today for ongoing prenatal care through [facility].  She is currently monitored for the following issues for this low-risk pregnancy and has History of poor fetal growth; Gestational trophoblastic neoplasm; History of gestational hypertension; Decreased hearing of both ears; Asherman's syndrome; Supervision of high risk pregnancy, antepartum; History of pyelonephritis; History of gestational diabetes mellitus (GDM) in prior pregnancy, currently pregnant; and Anemia during pregnancy in second trimester on their problem list.  Patient reports  episodes of dizziness, near syncope continue .   . Vag. Bleeding: None.  Movement: Present. Denies leaking of fluid/ROM.   The following portions of the patient's history were reviewed and updated as appropriate: allergies, current medications, past family history, past medical history, past social history, past surgical history and problem list. Problem list updated.  Objective:   Vitals:   12/23/22 0909  BP: 113/75  Pulse: (!) 112  Weight: 117 lb (53.1 kg)    Fetal Status: Fetal Heart Rate (bpm): 138 Fundal Height: 24 cm Movement: Present     General:  Alert, oriented and cooperative. Patient is in no acute distress.  Skin: Skin is warm and dry. No rash noted.   Cardiovascular: Normal heart rate noted  Respiratory: Normal respiratory effort, no problems with respiration noted  Abdomen: Soft, gravid, appropriate for gestational age.  Pain/Pressure: Absent     Pelvic: Cervical exam deferred        Extremities: Normal range of motion.  Edema: None  Mental Status: Normal mood and affect. Normal behavior. Normal judgment and thought content.   Assessment and Plan:  Pregnancy: G3P2002 at [redacted]w[redacted]d  1. Supervision of other normal pregnancy, antepartum  [facility] Pregnancy, Session#3: Reviewed resources  in [employer].   Facilitated discussion today:  Stress/Stress reduction, breastfeeding/infant nutrition, oral health, mindfulness activity completed as well as deep breathing/relaxation/centering  Fundal height and FHR appropriate today unless noted otherwise in plan. Patient to continue group care.     2. History of gestational trophoblastic disease   3. Syncope, unspecified syncope type --Hgb 10.0 on 5/2, repeat with 28 week labs on 6/17 --Refer to cardio obstetrics  4. Anemia during pregnancy in second trimester --Hgb 10.0 on 5/2, see above   5. Heartburn during pregnancy in third trimester --Prilosec 20 mg daily not effective, hx acid reflux prior to pregnancy --Rx for Prilosec 40 mg daily    Preterm labor symptoms and general obstetric precautions including but not limited to vaginal bleeding, contractions, leaking of fluid and fetal movement were reviewed in detail with the patient. Please refer to After Visit Summary for other counseling recommendations.  No follow-ups on file.  Future Appointments  Date Time Provider Department Center  01/06/2023  9:00 AM CENTER[facility] PROVIDER [facility]  01/06/2023 10:00 AM [facility] LAB [facility]  01/20/2023  9:00 AM CENTERING PROVIDER [facility]  02/03/2023  9:00 AM CENTERING PROVIDER [facility]  02/14/2023  3:00 PM Meriam Sprague, MD [facility] None  02/17/2023  9:00 AM CENTERING PROVIDER [facility]  02/17/2023 11:15 AM [facility] NURSE [facility]  02/17/2023 11:30 AM [facility] US2 [facility]  03/03/2023  9:00 AM CENTERING PROVIDER [facility]  03/17/2023  9:00 AM CENTERING PROVIDER [facility]  03/31/2023  9:00 AM CENTERING PROVIDER [facility]    Sharen Counter, CNM

## 2023-01-05 NOTE — Progress Notes (Unsigned)
       PRENATAL VISIT NOTE- [facility] Pregnancy Cycle {NUMBERS:20191}, Session # {NUMBERS:20191}  Subjective:  Shelly Koch is a 32 y.o. G3P2002 at [redacted]w[redacted]d being seen today for ongoing prenatal care through [facility].  She is currently monitored for the following issues for this {Blank single:19197::"high-risk","low-risk"} pregnancy and has History of poor fetal growth; Gestational trophoblastic neoplasm; History of gestational hypertension; Decreased hearing of both ears; Asherman's syndrome; Supervision of high risk pregnancy, antepartum; History of pyelonephritis; History of gestational diabetes mellitus (GDM) in prior pregnancy, currently pregnant; and Anemia during pregnancy in second trimester on their problem list.  Patient reports {sx:14538}.   .  .   . ***Denies leaking of fluid/ROM.   The following portions of the patient's history were reviewed and updated as appropriate: allergies, current medications, past family history, past medical history, past social history, past surgical history and problem list. Problem list updated.  Objective:  There were no vitals filed for this visit.  Fetal Status:           General:  Alert, oriented and cooperative. Patient is in no acute distress.  Skin: Skin is warm and dry. No rash noted.   Cardiovascular: Normal heart rate noted  Respiratory: Normal respiratory effort, no problems with respiration noted  Abdomen: Soft, gravid, appropriate for gestational age.        Pelvic: {Blank single:19197::"Cervical exam performed","Cervical exam deferred"}        Extremities: Normal range of motion.     Mental Status: Normal mood and affect. Normal behavior. Normal judgment and thought content.   Assessment and Plan:  Pregnancy: G3P2002 at [redacted]w[redacted]d  1. Supervision of other normal pregnancy, antepartum ***  [facility] Pregnancy, Session#4: Reviewed resources in [employer].   Facilitated discussion today:  Family planning intimate  partner violence, and preterm labor Mindfulness activity completed.   Fundal height and FHR appropriate today unless noted otherwise in plan. Patient to continue group care.   --GTT today  2. Syncope, unspecified syncope type ***  3. Anemia during pregnancy in second trimester *** --Hgb 10.0 on 5/2 --28 week labs today  4. Heartburn during pregnancy in third trimester ***  5. [redacted] weeks gestation of pregnancy ***   *** add [facility] dot phrase for session   {Blank single:19197::"Term","Preterm"} labor symptoms and general obstetric precautions including but not limited to vaginal bleeding, contractions, leaking of fluid and fetal movement were reviewed in detail with the patient. Please refer to After Visit Summary for other counseling recommendations.  No follow-ups on file.  Future Appointments  Date Time Provider Department Center  01/06/2023  9:00 AM CENTERING PROVIDER [facility]  01/06/2023 10:00 AM [facility] LAB [facility]  01/20/2023  9:00 AM CENTERING PROVIDER [facility]  02/03/2023  9:00 AM CENTERING PROVIDER [facility]  02/14/2023  3:00 PM Meriam Sprague, MD [facility] None  02/17/2023  9:00 AM CENTERING PROVIDER [facility]  02/17/2023 11:15 AM [facility] NURSE [facility]  02/17/2023 11:30 AM [facility] US2 [facility]  03/03/2023  9:00 AM CENTERING PROVIDER [facility]  03/17/2023  9:00 AM CENTERING PROVIDER [facility]  03/31/2023  9:00 AM CENTERING PROVIDER [facility]    Sharen Counter, CNM

## 2023-01-06 ENCOUNTER — Ambulatory Visit (INDEPENDENT_AMBULATORY_CARE_PROVIDER_SITE_OTHER): Payer: Medicaid Other | Admitting: Advanced Practice Midwife

## 2023-01-06 ENCOUNTER — Other Ambulatory Visit: Payer: Medicaid Other

## 2023-01-06 VITALS — BP 121/77 | HR 108 | Wt 115.0 lb

## 2023-01-06 DIAGNOSIS — R12 Heartburn: Secondary | ICD-10-CM

## 2023-01-06 DIAGNOSIS — R55 Syncope and collapse: Secondary | ICD-10-CM

## 2023-01-06 DIAGNOSIS — O26893 Other specified pregnancy related conditions, third trimester: Secondary | ICD-10-CM

## 2023-01-06 DIAGNOSIS — Z3480 Encounter for supervision of other normal pregnancy, unspecified trimester: Secondary | ICD-10-CM

## 2023-01-06 DIAGNOSIS — O26892 Other specified pregnancy related conditions, second trimester: Secondary | ICD-10-CM

## 2023-01-06 DIAGNOSIS — O99012 Anemia complicating pregnancy, second trimester: Secondary | ICD-10-CM

## 2023-01-06 DIAGNOSIS — Z3A27 27 weeks gestation of pregnancy: Secondary | ICD-10-CM

## 2023-01-06 MED ORDER — OMEPRAZOLE 40 MG PO CPDR: 40.0000 mg | DELAYED_RELEASE_CAPSULE | Freq: Every day | ORAL | 5 refills | Status: DC

## 2023-01-07 ENCOUNTER — Encounter: Payer: Self-pay | Admitting: Advanced Practice Midwife

## 2023-01-07 LAB — CBC
Hematocrit: 31 % — ABNORMAL LOW (ref 34.0–46.6)
Hemoglobin: 10.2 g/dL — ABNORMAL LOW (ref 11.1–15.9)
MCH: 32 pg (ref 26.6–33.0)
MCHC: 32.9 g/dL (ref 31.5–35.7)
MCV: 97 fL (ref 79–97)
Platelets: 141 10*3/uL — ABNORMAL LOW (ref 150–450)
RBC: 3.19 x10E6/uL — ABNORMAL LOW (ref 3.77–5.28)
RDW: 12.6 % (ref 11.7–15.4)
WBC: 8.9 10*3/uL (ref 3.4–10.8)

## 2023-01-07 LAB — RPR: RPR Ser Ql: NONREACTIVE

## 2023-01-07 LAB — HIV ANTIBODY (ROUTINE TESTING W REFLEX): HIV Screen 4th Generation wRfx: NONREACTIVE

## 2023-01-07 LAB — GLUCOSE TOLERANCE, 2 HOURS W/ 1HR
Glucose, 1 hour: 159 mg/dL (ref 70–179)
Glucose, 2 hour: 155 mg/dL — ABNORMAL HIGH (ref 70–152)
Glucose, Fasting: 82 mg/dL (ref 70–91)

## 2023-01-15 ENCOUNTER — Telehealth: Payer: Self-pay

## 2023-01-15 DIAGNOSIS — O24419 Gestational diabetes mellitus in pregnancy, unspecified control: Secondary | ICD-10-CM

## 2023-01-15 MED ORDER — ACCU-CHEK GUIDE VI STRP: ORAL_STRIP | 12 refills | Status: DC

## 2023-01-15 MED ORDER — ACCU-CHEK SOFTCLIX LANCETS MISC: 12 refills | Status: DC

## 2023-01-15 MED ORDER — ACCU-CHEK GUIDE W/DEVICE KIT: 1.0000 | PACK | 0 refills | Status: DC | PRN

## 2023-01-15 NOTE — Progress Notes (Signed)
Your platelet level is slightly low. Platelets help your blood clot normally. Your level isn't low enough to cause clotting problems. This can sometimes just happen due to pregnancy. Shelly Koch may suggest checking later in pregnancy and we will check again at the time if delivery to make sure your platelet level isn't getting too low.

## 2023-01-15 NOTE — Telephone Encounter (Addendum)
-----   Message from [place] sent at 01/15/2023  2:45 PM EDT ----- Please order testing supplies and schedule with Marylene Land if pt wants. Danelle Berry since she's in your Cent[facility] group)  Notified pt results and the need for diabetes education.  Pt reports that she is going to be leaving out of town on Friday for a few weeks.  I explained to the pt that we can have her start checking her BS.  Pt requests to be shown how to utilize the supplies.  Pt agreed to 01/16/23 @ 0840 to be shown how to utilize the diabetes testing supplies.   Leonette Nutting  01/15/23

## 2023-01-16 ENCOUNTER — Ambulatory Visit (INDEPENDENT_AMBULATORY_CARE_PROVIDER_SITE_OTHER): Payer: Medicaid Other

## 2023-01-16 ENCOUNTER — Other Ambulatory Visit: Payer: Self-pay

## 2023-01-16 VITALS — BP 107/77 | HR 88 | Ht <= 58 in | Wt 115.5 lb

## 2023-01-16 DIAGNOSIS — Z3A28 28 weeks gestation of pregnancy: Secondary | ICD-10-CM

## 2023-01-16 DIAGNOSIS — O24419 Gestational diabetes mellitus in pregnancy, unspecified control: Secondary | ICD-10-CM

## 2023-01-16 NOTE — Progress Notes (Signed)
Pt here today to demonstrate how to use CBG testing supplies.   Pt informed how to obtain CBG's.  Pt able to demonstrate in return effectively.  Pt provided with blood sugar log and advised when to check BS.  Pt encouraged to begin obtaining her blood sugars as she reports that she will be out of town for at least a couple of weeks.  Pt informed to please bring her values to her DM appt on 02/12/23 and to call the office with concerns.  Pt verbalized understanding with no further questions.   Leonette Nutting  01/16/23

## 2023-01-25 ENCOUNTER — Encounter: Payer: Self-pay | Admitting: Advanced Practice Midwife

## 2023-01-25 DIAGNOSIS — O99119 Other diseases of the blood and blood-forming organs and certain disorders involving the immune mechanism complicating pregnancy, unspecified trimester: Secondary | ICD-10-CM | POA: Insufficient documentation

## 2023-01-30 NOTE — H&P (Signed)
 ------------------------------------------------------------------------------- Attestation signed by Hadassah Provost, DO at 01/30/2023  6:11 PM I was physically present for the evaluation and management service provided.  I personally saw and examined the patient on rounds with my team today.  Management was discussed with Dr. Valerie (PGY2) and I supervised the plan of care.     I have reviewed and agree with the key parts of the resident evaluation including:  Subjective Information  Objective findings on physical exam  Impression and plan  Abdominal discomfort worse with walking, bilateral lower abdomen, left worse than right. No urinary symptoms, UAM resulted as non-infectious. No discharge, vaginitis panel pending. No leaking, no concern for pPROM. 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 Three contractions noted on monitor, she has not felt these. NST reassuring and reactive. Low suspicion for preterm labor at this time. Most consistent with round ligament pain, recommended continued use of maternity belt. Can try Tylenol  as well.   Hadassah Provost, DO  -------------------------------------------------------------------------------  Labor and Delivery Triage Note  Subjective:    This is a 32 y.o. G45P2002 female at [redacted]w[redacted]d with an Estimated Date of Delivery: 04/02/2023 c/b 9w US .  Chief Complaint: abdominal pain  History of Present Illness:  Is visiting from out of town [place], [place]) visiting mother. Flying back on Sunday.  Has been in [place] for 10 days, reports constant left lower abdominal pain and supapubic pressure for 1 day. Denies trauma but has been walking a lot, traveling, and doing a lot for other 2 children. Pain worsened by physical exertion. Mild to moderate relief with Tylenol . Does not know if she's been having contraction.   She denies VB, LOF, contractions and reports good FM. Says FM has been more than before.  Denies  dysuria, hematuria, constipation. Has regular bm every day.  Denies chest pain, SOB, vision changes, LH, N/V. Denies drug use.  PNC: [place], [place]  Medications Prenatal vitamin  Iron Stool softener  Current pregnancy history: gDM Gestational thrombocytopenia  Syncope, unspecified  Anemia during 2nd trimester Asherman's syndrome (05/2022) Postpartum GTN (09/20/2014, s/p MTX and acinomycin) Hx of gHTN (2018) Hx of IUGR (P1, 4lbs10oz)  Prenatals:  Type and screen AB pos Antibody screen neg HepBsAg neg HepC NR Syphilis NR HIV NR Rubella immune Varicella not visualized  GC/CT neg GCT not visualized; fasting glucose 01/06/23 was 82 GBS not done Aneuploidy screening low risk cfDNA Anatomy u/s: 11/11/22 fetal growth and amniotic fluid   level were appropriate for her gestational age.   There were no obvious fetal anomalies  OB History  Gravida Para Term Preterm AB Living  3 2 2     2   SAB IAB Ectopic Multiple Live Births          2    # Outcome Date GA Lbr Len/2nd Weight Sex Delivery Anes PTL Lv  3 Current           2 Term 09/17/17 [redacted]w[redacted]d   F Vag-Spont   LIV  1 Term 06/05/14 [redacted]w[redacted]d   F Vag-Spont   LIV    GYN Hx: Denies history of STIs. No abnormal paps.  Past Medical History:  Diagnosis Date  . Anemia   . Gestational diabetes   . Gestational hypertension     History reviewed. No pertinent surgical history.  Social History   Socioeconomic History  . Marital status: Single    Spouse name: Not on file  . Number of children: 2  . Years of education:  Not on file  . Highest education level: Not on file  Occupational History  . Not on file  Tobacco Use  . Smoking status: Never    Passive exposure: Never  . Smokeless tobacco: Never  Vaping Use  . Vaping status: Not on file  Substance and Sexual Activity  . Alcohol use: Never  . Drug use: Never  . Sexual activity: Defer  Other Topics Concern  . Not on file  Social History Narrative  . Not on  file    Allergies  Allergen Reactions  . Ondansetron  Hives, Itching and Rash  . Promethazine  Hives, Itching and Rash    Note: pt tolerates PO COMPAZINE     Medications Prior to Admission  Medication Sig Dispense Refill Last Dose  . acetaminophen  (TYLENOL ) 500 mg tablet Take 2 tablets (1,000 mg total) by mouth every 6 (six) hours if needed for mild pain.   01/29/2023  . docusate sodium  (COLACE) 50 mg capsule Take 1 capsule (50 mg total) by mouth 2 (two) times a day if needed for constipation.   01/29/2023  . ferrous sulfate  325 mg (65 mg iron) EC tablet Take 1 tablet (325 mg total) by mouth 1 (one) time each day with breakfast.   01/30/2023  . Prenatal Vitamin 27 mg iron- 0.8 mg per tablet Take 1 tablet by mouth 1 (one) time each day.   01/29/2023     Objective:     Physical Exam:   Vitals:   01/30/23 1629  BP: 139/78  Pulse: 110  Resp: 16  Temp: 36.8 C (98.2 F)  TempSrc: Oral  SpO2: 100%  Weight: 54.8 kg  Height: 1.473 m (58)   General:   Alert, no acute distress  Abdomen:  Soft, gravid, tender to palpation LLQ, L groin > R groin   Extremities:   extremities normal, atraumatic, no cyanosis or edema  Neurologic:   Grossly normal  Psychiatric:   normal mood, behavior, speech, dress, and thought processes   Pelvic Exam:  Closed thick and high  External Monitoring:  FHT: 140s BPM, mod variability, + accels, - decels TOCO: quiet  Labs:  UA Vaginitis panel pending  Assessment and Plan  This is a 32 y.o. [id number] [redacted]w[redacted]d who presents with abdominal pain and vaginal pressure.   Abdominal pain and pressure Round ligament pain - pain likely RLP given exacerbation with physical exertion and positional changes  - Differential includes PTL, PPROM, placental abruption, UTI, vaginitis - Working up infection with UA and vaginitis panel  - reassuring FHT and closed cervical exam rule out PTL/abruption or PPROM given no leaking - Counseled patient regarding conservative management  with abdominal belt, Tylenol , heating pads, and rest - Followup with OB/GYN [facility]   FWB - cat 1 tracing  Dispo: home   Discussed with Dr. Grayce  Discussed with and seen by Dr. Grayce Loni Russian, MD [facility] Medicine, PGY-2

## 2023-01-31 ENCOUNTER — Ambulatory Visit: Payer: Medicaid Other | Admitting: Cardiology

## 2023-02-03 ENCOUNTER — Ambulatory Visit: Payer: Medicaid Other | Admitting: Advanced Practice Midwife

## 2023-02-03 DIAGNOSIS — O24419 Gestational diabetes mellitus in pregnancy, unspecified control: Secondary | ICD-10-CM

## 2023-02-03 DIAGNOSIS — Z3A31 31 weeks gestation of pregnancy: Secondary | ICD-10-CM

## 2023-02-03 DIAGNOSIS — Z3480 Encounter for supervision of other normal pregnancy, unspecified trimester: Secondary | ICD-10-CM

## 2023-02-03 DIAGNOSIS — O99012 Anemia complicating pregnancy, second trimester: Secondary | ICD-10-CM

## 2023-02-11 NOTE — Progress Notes (Signed)
Cardio-Obstetrics Clinic  New Evaluation  Date:  02/14/2023   ID:  Shelly Koch, DOB 01-25-1991, MRN [id number]  PCP:  Genia Del   [facility] Providers Cardiologist:  None  Electrophysiologist:  None       Referring MD: Jac Canavan, PA-C   Chief Complaint: syncope  History of Present Illness:    Shelly Koch is a 32 y.o. female [G3P2002] who is being seen today for the evaluation of syncope at the request of Genia Del.   Has history of IUGR, gestational trophoblastic neoplasm, gestational HTN, Asherman's syndrome and gestational DMII.  Patient seen by Sharen Counter on 01/06/23 where she reported an episode of syncope prompting referral to Cardiology for further evaluation.  Today, the patient is currently [redacted]w[redacted]d pregnant. States she has had two episode of syncope during her pregnancy. One episode occurred at work where she suddenly felt very hot with blurred vision and then she passed out. Had one other episode with similar symptoms while at the store.  Notably, she has episode of syncope as a child in the past with similar feeling of being warm, lightheaded prior to syncopizing. Symptoms previously triggered by the sight of blood.   She also states that when she lays on her left hand side she feels short of breath, stands up quickly and then feels lightheaded and her heart pounds. She has not syncopized when this occurs but feels very close to passing out. Takes about 5-6minutes for her to recover after this episode.   Otherwise, no chest pain, orthopnea, LE edema, or PND.   Prior CV Studies Reviewed: The following studies were reviewed today: As per HPI  Past Medical History:  Diagnosis Date   Anemia    Asherman syndrome    Asthma    as child   Chronic bilateral low back pain without sciatica 11/23/2018   Gestational diabetes    first preg   Gestational trophoblastic neoplasm    Headache    Leukopenia due to  antineoplastic chemotherapy (HCC)    PICC (peripherally inserted central catheter) in place 12/24/2014   Pyelonephritis    UTI (urinary tract infection)     Past Surgical History:  Procedure Laterality Date   DILATION AND EVACUATION N/A 08/10/2014   Procedure: DILATATION AND EVACUATION;  Surgeon: Willodean Rosenthal, MD;  Location: [facility];  Service: Gynecology;  Laterality: N/A;   DILATION AND EVACUATION N/A 10/08/2017   Procedure: DILATATION AND EVACUATION;  Surgeon: Catalina Antigua, MD;  Location: [facility];  Service: Gynecology;  Laterality: N/A;   HYSTEROSCOPY N/A 08/10/2014   Procedure: HYSTEROSCOPY;  Surgeon: Willodean Rosenthal, MD;  Location: [facility];  Service: Gynecology;  Laterality: N/A;      OB History     Gravida  3   Para  2   Term  2   Preterm      AB      Living  2      SAB      IAB      Ectopic      Multiple  0   Live Births  2        Obstetric Comments  2015- started as twin preg, GDM             Current Medications: Current Meds  Medication Sig   Accu-Chek Softclix Lancets lancets Use as instructed   acetaminophen (TYLENOL) 500 MG tablet Take 500 mg by mouth every 6 (six) hours as needed.   Blood  Glucose Monitoring Suppl (ACCU-CHEK GUIDE) w/Device KIT 1 Device by Does not apply route as needed.   Blood Pressure Monitoring DEVI 1 each by Does not apply route once a week.   docusate sodium (COLACE) 50 MG capsule Take 50 mg by mouth daily as needed for mild constipation.   ferrous sulfate 325 (65 FE) MG EC tablet Take 1 tablet (325 mg total) by mouth every other day.   glucose blood (ACCU-CHEK GUIDE) test strip Use as instructed   metFORMIN (GLUCOPHAGE) 500 MG tablet Take 1 tablet (500 mg total) by mouth every 12 (twelve) hours.   omeprazole (PRILOSEC) 40 MG capsule Take 1 capsule (40 mg total) by mouth daily.   Prenatal Vit w/Fe-Methylfol-FA (PNV-SELECT) 27-0.6-0.4 MG TABS Take 1 tablet by mouth daily.     Allergies:   Okra,  Ondansetron, Phenergan [promethazine], and Zofran Frazier [facility] hcl]   Social History   Socioeconomic History   Marital status: Single    Spouse name: Not on file   Number of children: Not on file   Years of education: Not on file   Highest education level: Not on file  Occupational History   Not on file  Tobacco Use   Smoking status: Never   Smokeless tobacco: Never  Vaping Use   Vaping status: Never Used  Substance and Sexual Activity   Alcohol use: Never   Drug use: Never   Sexual activity: Yes    Partners: Male    Birth control/protection: Pill    Comment: patient reports IC within past week  Other Topics Concern   Not on file  Social History Narrative   Works as a Radio broadcast assistant   Social Determinants of Corporate investment banker Strain: Not on file  Food Insecurity: No Food Insecurity (09/27/2022)   Hunger Vital Sign    Worried About Running Out of Food in the Last Year: Never true    Ran Out of Food in the Last Year: Never true  Transportation Needs: No Transportation Needs (09/27/2022)   PRAPARE - Administrator, Civil Service (Medical): No    Lack of Transportation (Non-Medical): No  Physical Activity: Not on file  Stress: Not on file  Social Connections: Not on file      Family History  Problem Relation Age of Onset   Healthy Mother    Healthy Father    Alcohol abuse Neg Hx    Asthma Neg Hx    Cancer Neg Hx    Diabetes Neg Hx    Heart disease Neg Hx    Hypertension Neg Hx       ROS:   Please see the history of present illness.     All other systems reviewed and are negative.   Labs/EKG Reviewed:    EKG:   EKG on 02/13/23 shows NSR, HR 95bpm  Recent Labs: 01/06/2023: Hemoglobin 10.2; Platelets 141 02/13/2023: ALT 11; BUN <5; Creatinine, Ser 0.36; Potassium 3.5; Sodium 137   Recent Lipid Panel No results found for: "CHOL", "TRIG", "HDL", "CHOLHDL", "LDLCALC", "LDLDIRECT"  Physical Exam:    VS:  BP 104/68   Pulse (!) 102   Ht 4'  10" (1.473 m)   Wt 120 lb 1.6 oz (54.5 kg)   LMP 06/16/2022   SpO2 99%   BMI 25.10 kg/m     Wt Readings from Last 3 Encounters:  02/14/23 120 lb 1.6 oz (54.5 kg)  02/13/23 121 lb (54.9 kg)  02/12/23 117 lb 14.4 oz (53.5 kg)  GEN:  Well nourished, well developed in no acute distress HEENT: Normal NECK: No JVD; No carotid bruits CARDIAC: RRR, no murmurs, rubs, gallops RESPIRATORY:  Clear to auscultation without rales, wheezing or rhonchi  ABDOMEN: Gravid, nontender MUSCULOSKELETAL:  No edema; No deformity  SKIN: Warm and dry NEUROLOGIC:  Alert and oriented x 3 PSYCHIATRIC:  Normal affect    Risk Assessment/Risk Calculators:     ASSESSMENT & PLAN:    #Syncope -History very consistent with vasovagal symptoms with history of vasovagal syncope as child -Also with orthostatic symptoms with rapid position changes -Discussed conservative management at length today including hydration, slow position changes, electrolyte replacement and avoiding heat as able -Declined zio for now but will call if symptoms worsen  #Gestational DMII: -Monitoring BG at home -Not currently on medications  #History of Gestational HTN: -BP currently controlled -Continue to monitor at home  Patient Instructions  Medication Instructions:   Your physician recommends that you continue on your current medications as directed. Please refer to the Current Medication list given to you today.  *If you need a refill on your cardiac medications before your next appointment, please call your pharmacy*    Follow-Up:  AS NEEDED WITH DR. KARDIE TOBB EITHER HERE AT [facility] OR OUR [facility] OFFICE    Dispo:  No follow-ups on file.   Medication Adjustments/Labs and Tests Ordered: Current medicines are reviewed at length with the patient today.  Concerns regarding medicines are outlined above.  Tests Ordered: No orders of the defined types were placed in this encounter.  Medication  Changes: No orders of the defined types were placed in this encounter.

## 2023-02-12 ENCOUNTER — Encounter: Payer: Self-pay | Admitting: Skilled Nursing Facility1

## 2023-02-12 ENCOUNTER — Encounter: Payer: Medicaid Other | Attending: Advanced Practice Midwife | Admitting: Skilled Nursing Facility1

## 2023-02-12 VITALS — Wt 117.9 lb

## 2023-02-12 DIAGNOSIS — O24419 Gestational diabetes mellitus in pregnancy, unspecified control: Secondary | ICD-10-CM | POA: Diagnosis present

## 2023-02-12 NOTE — Progress Notes (Signed)
       PRENATAL VISIT NOTE- Cent[facility] Pregnancy Cycle 12, Session # 6  Subjective:  Shelly Koch is a 32 y.o. G3P2002 at [redacted]w[redacted]d being seen today for ongoing prenatal care through [facility]regnancy.  She is currently monitored for the following issues for this low-risk pregnancy and has History of poor fetal growth; Gestational trophoblastic neoplasm; History of gestational hypertension; Decreased hearing of both ears; Asherman's syndrome; Supervision of high risk pregnancy, antepartum; History of pyelonephritis; History of gestational diabetes mellitus (GDM) in prior pregnancy, currently pregnant; Anemia during pregnancy in second trimester; and Thrombocytopenia affecting pregnancy (HCC) on their problem list.  Patient reports no complaints.  Contractions: Not present. Vag. Bleeding: None.  Movement: Present. Denies leaking of fluid/ROM.   The following portions of the patient's history were reviewed and updated as appropriate: allergies, current medications, past family history, past medical history, past social history, past surgical history and problem list. Problem list updated.  Objective:  There were no vitals filed for this visit.  Fetal Status: Fetal Heart Rate (bpm): 150 Fundal Height: 31 cm Movement: Present     General:  Alert, oriented and cooperative. Patient is in no acute distress.  Skin: Skin is warm and dry. No rash noted.   Cardiovascular: Normal heart rate noted  Respiratory: Normal respiratory effort, no problems with respiration noted  Abdomen: Soft, gravid, appropriate for gestational age.  Pain/Pressure: Absent     Pelvic: Cervical exam deferred        Extremities: Normal range of motion.  Edema: None  Mental Status: Normal mood and affect. Normal behavior. Normal judgment and thought content.   Assessment and Plan:  Pregnancy: G3P2002 at [redacted]w[redacted]d  1. Supervision of other normal pregnancy, antepartum --Anticipatory guidance about next visits/weeks of pregnancy  given.    Centering Pregnancy, Session#6: Reviewed resources in [employer].  Facilitated discussion today: stages of labor, coping strategies, newborn care/safety   Mindfulness activity with positive affirmations   Fundal height and FHR appropriate today unless noted otherwise in plan. Patient to continue group care.    2. Gestational diabetes mellitus (GDM) in third trimester, gestational diabetes method of control unspecified *** --Pt to send blood sugar log via MyChart  3. Anemia during pregnancy in second trimester --Taking oral iron  4. [redacted] weeks gestation of pregnancy     Preterm labor symptoms and general obstetric precautions including but not limited to vaginal bleeding, contractions, leaking of fluid and fetal movement were reviewed in detail with the patient. Please refer to After Visit Summary for other counseling recommendations.  No follow-ups on file.  Future Appointments  Date Time Provider Department Center  02/14/2023  3:00 PM Meriam Sprague, MD [facility] None  02/17/2023  9:00 AM CENTERING PROVIDER [facility]  02/17/2023 11:15 AM [facility] NURSE [facility]  02/17/2023 11:30 AM [facility] US2 [facility]  03/03/2023  9:00 AM CENTERING PROVIDER [facility]  03/17/2023  9:00 AM CENTERING PROVIDER [facility]  03/31/2023  9:00 AM CENTERING PROVIDER [facility]    Sharen Counter, CNM

## 2023-02-12 NOTE — Progress Notes (Signed)
Patient was seen on 02/12/2023 for Gestational Diabetes self-management class at the [facility]. The following learning objectives were met by the patient during this course:  States the definition of Gestational Diabetes States why dietary management is important in controlling blood glucose Describes the effects each nutrient has on blood glucose levels Demonstrates ability to create a balanced meal plan Demonstrates carbohydrate counting  States when to check blood glucose levels Demonstrates proper blood glucose monitoring techniques States the effect of stress and exercise on blood glucose levels States the importance of limiting caffeine and abstaining from alcohol and smoking  Pt arrives drinking 2 bottles of water ad weight loss: Dietitian educated pt on ketone testing and importance of eat carbohydrates throughout the day  Patient instructed to monitor glucose levels: FBS: 60 - <90 1 hour: <140 2 hour: <120  *Patient received handouts: Nutrition Diabetes and Pregnancy Carbohydrate Counting List  Patient will be seen for follow-up as needed.

## 2023-02-13 ENCOUNTER — Encounter (HOSPITAL_COMMUNITY): Payer: Self-pay | Admitting: Obstetrics and Gynecology

## 2023-02-13 ENCOUNTER — Inpatient Hospital Stay (HOSPITAL_COMMUNITY)
Admission: AD | Admit: 2023-02-13 | Discharge: 2023-02-13 | Disposition: A | Discharge status: Home / Self Care | Payer: Medicaid Other | Attending: Obstetrics and Gynecology | Admitting: Obstetrics and Gynecology

## 2023-02-13 DIAGNOSIS — R000 Tachycardia, unspecified: Secondary | ICD-10-CM | POA: Diagnosis not present

## 2023-02-13 DIAGNOSIS — R42 Dizziness and giddiness: Secondary | ICD-10-CM

## 2023-02-13 DIAGNOSIS — O0990 Supervision of high risk pregnancy, unspecified, unspecified trimester: Secondary | ICD-10-CM

## 2023-02-13 DIAGNOSIS — O24415 Gestational diabetes mellitus in pregnancy, controlled by oral hypoglycemic drugs: Secondary | ICD-10-CM | POA: Insufficient documentation

## 2023-02-13 DIAGNOSIS — O99891 Other specified diseases and conditions complicating pregnancy: Secondary | ICD-10-CM | POA: Diagnosis not present

## 2023-02-13 DIAGNOSIS — O99119 Other diseases of the blood and blood-forming organs and certain disorders involving the immune mechanism complicating pregnancy, unspecified trimester: Secondary | ICD-10-CM

## 2023-02-13 DIAGNOSIS — Z3A33 33 weeks gestation of pregnancy: Secondary | ICD-10-CM | POA: Insufficient documentation

## 2023-02-13 DIAGNOSIS — O24419 Gestational diabetes mellitus in pregnancy, unspecified control: Secondary | ICD-10-CM | POA: Diagnosis present

## 2023-02-13 LAB — COMPREHENSIVE METABOLIC PANEL
ALT: 11 U/L (ref 0–44)
AST: 18 U/L (ref 15–41)
Albumin: 2.4 g/dL — ABNORMAL LOW (ref 3.5–5.0)
Alkaline Phosphatase: 111 U/L (ref 38–126)
Anion gap: 13 (ref 5–15)
BUN: <5 mg/dL — ABNORMAL LOW (ref 6–20)
CO2: 23 mmol/L (ref 22–32)
Calcium: 8.7 mg/dL — ABNORMAL LOW (ref 8.9–10.3)
Chloride: 101 mmol/L (ref 98–111)
Creatinine, Ser: 0.36 mg/dL — ABNORMAL LOW (ref 0.44–1.00)
GFR, Estimated: >60 mL/min (ref >60)
Glucose, Bld: 121 mg/dL — ABNORMAL HIGH (ref 70–99)
Potassium: 3.5 mmol/L (ref 3.5–5.1)
Sodium: 137 mmol/L (ref 135–145)
Total Bilirubin: 0.6 mg/dL (ref 0.3–1.2)
Total Protein: 5.7 g/dL — ABNORMAL LOW (ref 6.5–8.1)

## 2023-02-13 LAB — URINALYSIS, ROUTINE W REFLEX MICROSCOPIC
Bilirubin Urine: NEGATIVE
Glucose, UA: NEGATIVE mg/dL
Hgb urine dipstick: NEGATIVE
Ketones, ur: NEGATIVE mg/dL
Leukocytes,Ua: NEGATIVE
Nitrite: NEGATIVE
Protein, ur: NEGATIVE mg/dL
Specific Gravity, Urine: 1.006 (ref 1.005–1.030)
pH: 7 (ref 5.0–8.0)

## 2023-02-13 MED ORDER — LACTATED RINGERS IV BOLUS
1000.0000 mL | Freq: Once | INTRAVENOUS | Status: AC
  Administered 2023-02-13: 1000 mL via INTRAVENOUS

## 2023-02-13 MED ORDER — METFORMIN HCL 500 MG PO TABS: 500.0000 mg | ORAL_TABLET | Freq: Two times a day (BID) | ORAL | 1 refills | Status: DC

## 2023-02-13 NOTE — MAU Provider Note (Addendum)
History     CSN: [id number]  Arrival date and time: 02/13/23 1837   Event Date/Time   First Provider Initiated Contact with Patient 02/13/23 1944      Chief Complaint  Patient presents with   Tachycardia   HPI  [name] is a 32 y.o. female G3P2002 @ [redacted]w[redacted]d here in MAU with complaints of  dizziness and tachycardia. Symptoms started today. She has GDM,  diagnosed with traditional 2 hour GTT at 28 weeks. She has been checking her BS at home fasting and 2 hours after each meal. She has a complete BS log today from 7/1-7/15. The rest of her log is at home written on a piece of paper. She reports most of her fasting numbers are 150/160's.   She is scheduled with Dr. Servando Salina tomorrow with cardiology.   OB History     Gravida  3   Para  2   Term  2   Preterm      AB      Living  2      SAB      IAB      Ectopic      Multiple  0   Live Births  2        Obstetric Comments  2015- started as twin preg, GDM         Past Medical History:  Diagnosis Date   Anemia    Asherman syndrome    Asthma    as child   Chronic bilateral low back pain without sciatica 11/23/2018   Gestational diabetes    first preg   Gestational trophoblastic neoplasm    Headache    Leukopenia due to antineoplastic chemotherapy (HCC)    PICC (peripherally inserted central catheter) in place 12/24/2014   Pyelonephritis    UTI (urinary tract infection)     Past Surgical History:  Procedure Laterality Date   DILATION AND EVACUATION N/A 08/10/2014   Procedure: DILATATION AND EVACUATION;  Surgeon: Willodean Rosenthal, MD;  Location: [facility];  Service: Gynecology;  Laterality: N/A;   DILATION AND EVACUATION N/A 10/08/2017   Procedure: DILATATION AND EVACUATION;  Surgeon: Catalina Antigua, MD;  Location: [facility];  Service: Gynecology;  Laterality: N/A;   HYSTEROSCOPY N/A 08/10/2014   Procedure: HYSTEROSCOPY;  Surgeon: Willodean Rosenthal, MD;  Location: [facility];  Service: Gynecology;   Laterality: N/A;    Family History  Problem Relation Age of Onset   Healthy Mother    Healthy Father    Alcohol abuse Neg Hx    Asthma Neg Hx    Cancer Neg Hx    Diabetes Neg Hx    Heart disease Neg Hx    Hypertension Neg Hx     Social History   Tobacco Use   Smoking status: Never   Smokeless tobacco: Never  Vaping Use   Vaping status: Never Used  Substance Use Topics   Alcohol use: Never   Drug use: Never    Allergies:  Allergies  Allergen Reactions   Okra Itching    Hives and itching   Ondansetron Hives and Itching   Phenergan [Promethazine] Hives and Itching    Note: pt tolerates PO COMPAZINE   Zofran [Ondansetron Hcl] Rash    Medications Prior to Admission  Medication Sig Dispense Refill Last Dose   docusate sodium (COLACE) 50 MG capsule Take 50 mg by mouth daily as needed for mild constipation.   02/12/2023   ferrous sulfate 325 (65 FE) MG EC  tablet Take 1 tablet (325 mg total) by mouth every other day. 45 tablet 1 02/12/2023   omeprazole (PRILOSEC) 40 MG capsule Take 1 capsule (40 mg total) by mouth daily. 30 capsule 5 02/12/2023   Prenatal Vit w/Fe-Methylfol-FA (PNV-SELECT) 27-0.6-0.4 MG TABS Take 1 tablet by mouth daily. 30 tablet 12 02/12/2023   Accu-Chek Softclix Lancets lancets Use as instructed 100 each 12    acetaminophen (TYLENOL) 500 MG tablet Take 500 mg by mouth every 6 (six) hours as needed.      Blood Glucose Monitoring Suppl (ACCU-CHEK GUIDE) w/Device KIT 1 Device by Does not apply route as needed. 1 kit 0    Blood Pressure Monitoring DEVI 1 each by Does not apply route once a week. 1 each 0    glucose blood (ACCU-CHEK GUIDE) test strip Use as instructed 100 each 12    Results for orders placed or performed during the hospital encounter of 02/13/23 (from the past 48 hour(s))  Urinalysis, Routine w reflex microscopic -Urine, Clean Catch     Status: Abnormal   Collection Time: 02/13/23  7:27 PM  Result Value Ref Range   Color, Urine STRAW (A) YELLOW    APPearance CLEAR CLEAR   Specific Gravity, Urine 1.006 1.005 - 1.030   pH 7.0 5.0 - 8.0   Glucose, UA NEGATIVE NEGATIVE mg/dL   Hgb urine dipstick NEGATIVE NEGATIVE   Bilirubin Urine NEGATIVE NEGATIVE   Ketones, ur NEGATIVE NEGATIVE mg/dL   Protein, ur NEGATIVE NEGATIVE mg/dL   Nitrite NEGATIVE NEGATIVE   Leukocytes,Ua NEGATIVE NEGATIVE    Comment: Performed at [facility], [address], [place], [place] [address]    Review of Systems  Constitutional:  Positive for fatigue.  Cardiovascular:  Positive for palpitations.  Gastrointestinal:  Negative for abdominal pain.  Genitourinary:  Negative for vaginal bleeding.   Physical Exam   Blood pressure 125/71, pulse 97, temperature 98 F (36.7 C), resp. rate 18, height 4\' 10"  (1.473 m), weight 54.9 kg, last menstrual period 06/16/2022, SpO2 100%.  Physical Exam Vitals and nursing note reviewed.  Constitutional:      General: She is not in acute distress.    Appearance: Normal appearance. She is not ill-appearing, toxic-appearing or diaphoretic.  Cardiovascular:     Rate and Rhythm: Normal rate and regular rhythm.  Pulmonary:     Effort: Pulmonary effort is normal.  Skin:    General: Skin is warm.  Neurological:     Mental Status: She is alert and oriented to person, place, and time.  Psychiatric:        Behavior: Behavior normal.    Fetal Tracing: Baseline: 140 bpm Variability: Moderate  Accelerations: 15x15 Decelerations: None Toco: Occasional   MAU Course  Procedures  MDM  Bs log from 7/1-7/15 shows fasting's 84-104 and two outliers 123 and 109. 2 hour post meal readings show 31/36 readings >120 CMP and LR bolus ordered Orthostatic vitals: Normal  EKG: Normal sinus rhythm, reviewed by Dr. Shawnie Pons.  Discussed metformin vs insulin. Might be best to initiate metformin and let her follow up with OB. Report given to M. Mayford Knife CNM who resumes care of the patient. CMP pending LR bolus infusing.   Results  for orders placed or performed during the hospital encounter of 02/13/23 (from the past 24 hour(s))  Urinalysis, Routine w reflex microscopic -Urine, Clean Catch     Status: Abnormal   Collection Time: 02/13/23  7:27 PM  Result Value Ref Range   Color, Urine  STRAW (A) YELLOW   APPearance CLEAR CLEAR   Specific Gravity, Urine 1.006 1.005 - 1.030   pH 7.0 5.0 - 8.0   Glucose, UA NEGATIVE NEGATIVE mg/dL   Hgb urine dipstick NEGATIVE NEGATIVE   Bilirubin Urine NEGATIVE NEGATIVE   Ketones, ur NEGATIVE NEGATIVE mg/dL   Protein, ur NEGATIVE NEGATIVE mg/dL   Nitrite NEGATIVE NEGATIVE   Leukocytes,Ua NEGATIVE NEGATIVE  Comprehensive metabolic panel     Status: Abnormal   Collection Time: 02/13/23  8:46 PM  Result Value Ref Range   Sodium 137 135 - 145 mmol/L   Potassium 3.5 3.5 - 5.1 mmol/L   Chloride 101 98 - 111 mmol/L   CO2 23 22 - 32 mmol/L   Glucose, Bld 121 (H) 70 - 99 mg/dL   BUN <5 (L) 6 - 20 mg/dL   Creatinine, Ser 1.47 (L) 0.44 - 1.00 mg/dL   Calcium 8.7 (L) 8.9 - 10.3 mg/dL   Total Protein 5.7 (L) 6.5 - 8.1 g/dL   Albumin 2.4 (L) 3.5 - 5.0 g/dL   AST 18 15 - 41 U/L   ALT 11 0 - 44 U/L   Alkaline Phosphatase 111 38 - 126 U/L   Total Bilirubin 0.6 0.3 - 1.2 mg/dL   GFR, Estimated >82 >95 mL/min   Anion gap 13 5 - 15   Felt better after IV fluids Discussed Metformin and how to take Warned it may cause temporary loose stools Has followup scheduled early next week  Assessment and Plan  A;   Single IUP at [redacted]w[redacted]d        Dizziness        Tachycardia        New Gestational Diabetes  P: Discharge home      Rx Metformin for Diabetes      Push PO fluids     Has appt with Dr Servando Salina tomorrow     Encouraged to return if she develops worsening of symptoms, increase in pain, fever, or other concerning symptoms.   Aviva Signs, CNM

## 2023-02-13 NOTE — MAU Note (Addendum)
.  Shelly Koch is a 32 y.o. at [redacted]w[redacted]d here in MAU reporting dizziness since this am and feels that her heart was beating fast. Thinks she is having some ctxs. GDM and states her sugar today was 166. (Diet control)Reports good FM. Denies LOF or VB. Later in Triage reports mild h/a all day since being dizzy  Onset of complaint: this am Pain score: 7 Vitals:   02/13/23 1923 02/13/23 1926  BP:  116/79  Pulse: 100   Resp: 18   Temp: 98 F (36.7 C)   SpO2: 100%      FHT:155 Lab orders placed from triage:  u/a

## 2023-02-13 NOTE — MAU Note (Signed)
PT standing at bedside for orthostatic v/s.

## 2023-02-13 NOTE — Progress Notes (Signed)
Baby very active 

## 2023-02-14 ENCOUNTER — Ambulatory Visit (INDEPENDENT_AMBULATORY_CARE_PROVIDER_SITE_OTHER): Payer: Medicaid Other | Admitting: Cardiology

## 2023-02-14 ENCOUNTER — Encounter: Payer: Self-pay | Admitting: Cardiology

## 2023-02-14 VITALS — BP 104/68 | HR 102 | Ht <= 58 in | Wt 120.1 lb

## 2023-02-14 DIAGNOSIS — Z8632 Personal history of gestational diabetes: Secondary | ICD-10-CM

## 2023-02-14 DIAGNOSIS — O09299 Supervision of pregnancy with other poor reproductive or obstetric history, unspecified trimester: Secondary | ICD-10-CM

## 2023-02-14 DIAGNOSIS — R55 Syncope and collapse: Secondary | ICD-10-CM | POA: Diagnosis not present

## 2023-02-14 DIAGNOSIS — Z3A33 33 weeks gestation of pregnancy: Secondary | ICD-10-CM

## 2023-02-14 DIAGNOSIS — Z8759 Personal history of other complications of pregnancy, childbirth and the puerperium: Secondary | ICD-10-CM | POA: Diagnosis not present

## 2023-02-14 NOTE — Patient Instructions (Signed)
Medication Instructions:   Your physician recommends that you continue on your current medications as directed. Please refer to the Current Medication list given to you today.  *If you need a refill on your cardiac medications before your next appointment, please call your pharmacy*    Follow-Up:  AS NEEDED WITH DR. KARDIE TOBB EITHER HERE AT [facility] OR OUR [facility] OFFICE

## 2023-02-17 ENCOUNTER — Ambulatory Visit: Payer: Medicaid Other | Attending: Obstetrics and Gynecology

## 2023-02-17 ENCOUNTER — Ambulatory Visit: Payer: Medicaid Other

## 2023-02-17 ENCOUNTER — Encounter: Payer: Self-pay | Admitting: *Deleted

## 2023-02-17 ENCOUNTER — Other Ambulatory Visit: Payer: Self-pay | Admitting: *Deleted

## 2023-02-17 ENCOUNTER — Other Ambulatory Visit: Payer: Self-pay | Admitting: Obstetrics and Gynecology

## 2023-02-17 ENCOUNTER — Ambulatory Visit: Payer: Medicaid Other | Admitting: *Deleted

## 2023-02-17 VITALS — BP 113/71 | HR 111

## 2023-02-17 VITALS — BP 116/75 | HR 103 | Wt 119.0 lb

## 2023-02-17 DIAGNOSIS — O99119 Other diseases of the blood and blood-forming organs and certain disorders involving the immune mechanism complicating pregnancy, unspecified trimester: Secondary | ICD-10-CM | POA: Diagnosis present

## 2023-02-17 DIAGNOSIS — O24415 Gestational diabetes mellitus in pregnancy, controlled by oral hypoglycemic drugs: Secondary | ICD-10-CM

## 2023-02-17 DIAGNOSIS — D649 Anemia, unspecified: Secondary | ICD-10-CM

## 2023-02-17 DIAGNOSIS — O0990 Supervision of high risk pregnancy, unspecified, unspecified trimester: Secondary | ICD-10-CM

## 2023-02-17 DIAGNOSIS — O0993 Supervision of high risk pregnancy, unspecified, third trimester: Secondary | ICD-10-CM | POA: Diagnosis not present

## 2023-02-17 DIAGNOSIS — Z8759 Personal history of other complications of pregnancy, childbirth and the puerperium: Secondary | ICD-10-CM

## 2023-02-17 DIAGNOSIS — Z3689 Encounter for other specified antenatal screening: Secondary | ICD-10-CM

## 2023-02-17 DIAGNOSIS — Z3A33 33 weeks gestation of pregnancy: Secondary | ICD-10-CM | POA: Diagnosis not present

## 2023-02-17 DIAGNOSIS — O09293 Supervision of pregnancy with other poor reproductive or obstetric history, third trimester: Secondary | ICD-10-CM | POA: Diagnosis not present

## 2023-02-17 DIAGNOSIS — O99013 Anemia complicating pregnancy, third trimester: Secondary | ICD-10-CM

## 2023-02-17 DIAGNOSIS — R42 Dizziness and giddiness: Secondary | ICD-10-CM

## 2023-02-17 DIAGNOSIS — D696 Thrombocytopenia, unspecified: Secondary | ICD-10-CM | POA: Insufficient documentation

## 2023-02-17 NOTE — Progress Notes (Signed)
PRENATAL VISIT NOTE- Centering Pregnancy Cycle 12, Session # 7  Subjective:  Shelly Koch is a 32 y.o. G3P2002 at [redacted]w[redacted]d being seen today for ongoing prenatal care through Centering Pregnancy.  She is currently monitored for the following issues for this high-risk pregnancy and has History of poor fetal growth; Gestational trophoblastic neoplasm; History of gestational hypertension; Supervision of high risk pregnancy, antepartum; History of pyelonephritis; History of gestational diabetes mellitus (GDM) in prior pregnancy, currently pregnant; Anemia during pregnancy in second trimester; Thrombocytopenia affecting pregnancy (HCC); and Gestational diabetes on their problem list.  Patient reports no complaints.  Contractions: Not present. Vag. Bleeding: None.  Movement: Present. Denies leaking of fluid/ROM.   The following portions of the patient's history were reviewed and updated as appropriate: allergies, current medications, past family history, past medical history, past social history, past surgical history and problem list. Problem list updated.  Objective:   Vitals:   02/17/23 0907  BP: 116/75  Pulse: (!) 103  Weight: 119 lb (54 kg)    Fetal Status: Fetal Heart Rate (bpm): 145 Fundal Height: 32 cm Movement: Present     General:  Alert, oriented and cooperative. Patient is in no acute distress.  Skin: Skin is warm and dry. No rash noted.   Cardiovascular: Normal heart rate noted  Respiratory: Normal respiratory effort, no problems with respiration noted  Abdomen: Soft, gravid, appropriate for gestational age.  Pain/Pressure: Absent     Pelvic: Cervical exam deferred        Extremities: Normal range of motion.  Edema: None  Mental Status: Normal mood and affect. Normal behavior. Normal judgment and thought content.   Assessment and Plan:  Pregnancy: G3P2002 at [redacted]w[redacted]d  1. Supervision of high risk pregnancy, antepartum --Anticipatory guidance about next visits/weeks of  pregnancy given.    Centering Pregnancy, Session#7: Reviewed resources in [employer].   Facilitated discussion today:  parenting, newborn safety, breastfeeding   Fundal height and FHR appropriate today unless noted otherwise in plan. Patient to continue group care.   2. Dizziness --MAU visit 7/25, improved after IV fluids.  Hgb 10.2 on 6/17.   --Cardiology f/u 7/26, likely vasovagal symptoms, conservative management with hydration, slow position changes, electrolyte replacement and avoiding heat as able per Dr Shari Prows.  3. Gestational diabetes mellitus (GDM) in third trimester controlled on oral hypoglycemic drug -Reviewed glucose log.  Pt had MAU visit on 7/25 for dizziness, had elevated glucose and Metformin was initiated.  --Fasting values 85-123 with 9 out of 25 elevated.  PP values 90-166 with 49 out of 75 elevated.    --Pt started Metformin last night, and has taken 2 doses. --Continue Metformin 500 mg BID, send glucose log to CNM in 1 week.  4. [redacted] weeks gestation of pregnancy    Preterm labor symptoms and general obstetric precautions including but not limited to vaginal bleeding, contractions, leaking of fluid and fetal movement were reviewed in detail with the patient. Please refer to After Visit Summary for other counseling recommendations.  No follow-ups on file.  Future Appointments  Date Time Provider Department Center  02/25/2023  9:30 AM [facility] NURSE [facility] [place]  02/25/2023  9:45 AM [facility] US7 [facility]  03/03/2023  9:00 AM CENTERING PROVIDER [facility]  03/04/2023  8:30 AM [facility] NURSE [facility]  03/04/2023  8:45 AM [facility] US6 [facility]  03/10/2023  1:30 PM [facility] NURSE [facility]  03/10/2023  1:45 PM [facility] US4 [facility]  03/17/2023  9:00  AM CENTERING PROVIDER [facility]  03/17/2023 12:30 PM [facility] NURSE [facility]  03/17/2023 12:45 PM [facility] US5 [facility]  03/25/2023 10:30 AM [facility] NURSE [facility]  03/25/2023 10:45 AM [facility] US4  [facility]  03/31/2023  9:00 AM CENTERING PROVIDER [facility]    Sharen Counter, CNM

## 2023-02-24 ENCOUNTER — Other Ambulatory Visit: Payer: Self-pay | Admitting: Advanced Practice Midwife

## 2023-02-24 MED ORDER — METFORMIN HCL 1000 MG PO TABS: 1000.0000 mg | ORAL_TABLET | Freq: Two times a day (BID) | ORAL | 1 refills | Status: DC

## 2023-02-25 ENCOUNTER — Ambulatory Visit: Payer: Medicaid Other | Attending: Obstetrics

## 2023-02-25 ENCOUNTER — Other Ambulatory Visit: Payer: Self-pay | Admitting: Obstetrics and Gynecology

## 2023-02-25 ENCOUNTER — Ambulatory Visit (INDEPENDENT_AMBULATORY_CARE_PROVIDER_SITE_OTHER): Payer: Medicaid Other | Admitting: Obstetrics and Gynecology

## 2023-02-25 ENCOUNTER — Ambulatory Visit: Payer: Medicaid Other | Admitting: *Deleted

## 2023-02-25 VITALS — BP 111/77 | HR 117 | Wt 118.4 lb

## 2023-02-25 VITALS — BP 115/72 | HR 105

## 2023-02-25 DIAGNOSIS — O0990 Supervision of high risk pregnancy, unspecified, unspecified trimester: Secondary | ICD-10-CM

## 2023-02-25 DIAGNOSIS — O99119 Other diseases of the blood and blood-forming organs and certain disorders involving the immune mechanism complicating pregnancy, unspecified trimester: Secondary | ICD-10-CM | POA: Diagnosis present

## 2023-02-25 DIAGNOSIS — D696 Thrombocytopenia, unspecified: Secondary | ICD-10-CM | POA: Diagnosis present

## 2023-02-25 DIAGNOSIS — O99013 Anemia complicating pregnancy, third trimester: Secondary | ICD-10-CM

## 2023-02-25 DIAGNOSIS — O24415 Gestational diabetes mellitus in pregnancy, controlled by oral hypoglycemic drugs: Secondary | ICD-10-CM | POA: Insufficient documentation

## 2023-02-25 DIAGNOSIS — K219 Gastro-esophageal reflux disease without esophagitis: Secondary | ICD-10-CM

## 2023-02-25 DIAGNOSIS — Z8759 Personal history of other complications of pregnancy, childbirth and the puerperium: Secondary | ICD-10-CM | POA: Diagnosis present

## 2023-02-25 DIAGNOSIS — Z3A34 34 weeks gestation of pregnancy: Secondary | ICD-10-CM

## 2023-02-25 DIAGNOSIS — D649 Anemia, unspecified: Secondary | ICD-10-CM

## 2023-02-25 DIAGNOSIS — O09293 Supervision of pregnancy with other poor reproductive or obstetric history, third trimester: Secondary | ICD-10-CM | POA: Diagnosis not present

## 2023-02-25 DIAGNOSIS — O0993 Supervision of high risk pregnancy, unspecified, third trimester: Secondary | ICD-10-CM

## 2023-02-25 MED ORDER — FAMOTIDINE 20 MG PO TABS: 20.0000 mg | ORAL_TABLET | Freq: Every day | ORAL | 0 refills | Status: DC

## 2023-02-25 NOTE — Progress Notes (Unsigned)
PRENATAL VISIT NOTE  Subjective:  Shelly Koch is a 32 y.o. G3P2002 at [redacted]w[redacted]d being seen today for ongoing prenatal care.  She is currently monitored for the following issues for this high-risk pregnancy and has History of poor fetal growth; Gestational trophoblastic neoplasm; History of gestational hypertension; Supervision of high risk pregnancy, antepartum; History of pyelonephritis; History of gestational diabetes mellitus (GDM) in prior pregnancy, currently pregnant; Anemia during pregnancy in second trimester; Thrombocytopenia affecting pregnancy (HCC); and Gestational diabetes on their problem list.  Patient reports Passed out on 8/3, woke up and checked blood sugar it was 166. Reports sitting on the floor and then woke up after she passed out, partner states she just fell over. Contractions: Irritability. Vag. Bleeding: None.  Movement: (!) Decreased. Denies leaking of fluid.   The following portions of the patient's history were reviewed and updated as appropriate: allergies, current medications, past family history, past medical history, past social history, past surgical history and problem list.   Objective:   Vitals:   02/25/23 1057  BP: 111/77  Pulse: (!) 117  Weight: 118 lb 6.4 oz (53.7 kg)    Fetal Status: Fetal Heart Rate (bpm): 154   Movement: (!) Decreased     General:  Alert, oriented and cooperative. Patient is in no acute distress.  Skin: Skin is warm and dry. No rash noted.   Cardiovascular: Normal heart rate noted  Respiratory: Normal respiratory effort, no problems with respiration noted  Abdomen: Soft, gravid, appropriate for gestational age.  Pain/Pressure: Present     Pelvic: Cervical exam deferred        Extremities: Normal range of motion.  Edema: None  Mental Status: Normal mood and affect. Normal behavior. Normal judgment and thought content.   Assessment and Plan:  Pregnancy: G3P2002 at [redacted]w[redacted]d 1. Supervision of high risk pregnancy, antepartum Just  had visit upstairs with MFM, BPP reassuring, they encouraged her to do kick counts.  BP and FHR normal Feeling movement, strict precautions when to follow up   2. Gestational diabetes mellitus (GDM) in third trimester controlled on oral hypoglycemic drug Sent results through chart yesterday, provider increased dose to 1000mg  BID, has not started this does yet. I reviewed log today and encouraged patient to start increased dose vs. Insulin, but we definitely need one or the other. She will start the increased dose.   3. Gastroesophageal reflux disease without esophagitis Still having heartburn, doubling omeprazole. D/C omeprazole and trial famotidine.   4. [redacted] weeks gestation of pregnancy Discussed syncopal episode, tachycardia, role of GDM, discussed diet and hydration. Symptomatic treatment that cardiology discussed. Pt had checked bg at time of syncopal episode and was not hypoglycemic. Had previously declined zio patch at cariology visit and discussed it might be worth trying to assess symptoms further. Sent a message to Dr. Shari Prows letting them know she is interested in the zio, if they could set that up.  Strict precautions discussed when to go to hospital   Preterm labor symptoms and general obstetric precautions including but not limited to vaginal bleeding, contractions, leaking of fluid and fetal movement were reviewed in detail with the patient. Please refer to After Visit Summary for other counseling recommendations.   Return in one week for Centering  Future Appointments  Date Time Provider Department Center  03/03/2023  9:00 AM CENTERING PROVIDER [facility]  03/04/2023  8:30 AM [facility] NURSE [facility]  03/04/2023  8:45 AM [facility] US6 [facility]  03/10/2023  1:30 PM [facility] NURSE [facility]  [facility]  03/10/2023  1:45 PM [facility] US4 [facility]  03/17/2023  9:00 AM CENTERING PROVIDER [facility]  03/17/2023 12:30 PM [facility] NURSE [facility]  03/17/2023 12:45 PM [facility] US5 [facility]  [facility]  03/25/2023 10:30 AM [facility] NURSE [facility]  03/25/2023 10:45 AM [facility] US4 [facility]  03/31/2023  9:00 AM CENTERING PROVIDER [facility]    Albertine Grates, FNP

## 2023-02-26 ENCOUNTER — Telehealth: Payer: Self-pay

## 2023-02-26 ENCOUNTER — Ambulatory Visit: Payer: Medicaid Other | Attending: Cardiology

## 2023-02-26 DIAGNOSIS — R42 Dizziness and giddiness: Secondary | ICD-10-CM

## 2023-02-26 NOTE — Telephone Encounter (Signed)
-----   Message from Meriam Sprague sent at 02/26/2023  1:12 PM EDT ----- Regarding: RE: Heart monitor Sure!  Triage team, do you mind sending her a 7 day zio to be read by Dr. Servando Salina since I am no longer there.  Thank you!! ----- Message ----- From: Sue Lush, FNP Sent: 02/25/2023   1:32 PM EDT To: Meriam Sprague, MD Subject: Heart monitor                                  Hello, I just saw this patient for an OB visit and she reports continued dizziness. I know she saw you on the 26th for a visit where you discussed vasovagal and symptomatic treatment. She had another syncopal episode on 8/3. Blood sugar was 166, still reporting tachycardia. We discussed the symptomatic treatment that you had discussed with her at her visit and she is willing do to the zio monitor you discussed with her. Would you be able to set that up with her?   Thank you, Albertine Grates, FNP

## 2023-02-26 NOTE — Telephone Encounter (Signed)
Zio order placed ?

## 2023-02-26 NOTE — Progress Notes (Unsigned)
Enrolled for [employer] to mail a ZIO XT long term holter monitor to the patients address on file.   Dr. Harriet Masson to read.

## 2023-03-03 ENCOUNTER — Ambulatory Visit: Payer: Medicaid Other | Admitting: Family Medicine

## 2023-03-03 ENCOUNTER — Other Ambulatory Visit: Payer: Self-pay | Admitting: Lactation Services

## 2023-03-03 ENCOUNTER — Other Ambulatory Visit (HOSPITAL_COMMUNITY)
Admission: RE | Admit: 2023-03-03 | Discharge: 2023-03-03 | Disposition: A | Discharge status: Home / Self Care | Payer: Medicaid Other | Source: Ambulatory Visit | Attending: Family Medicine | Admitting: Family Medicine

## 2023-03-03 VITALS — BP 109/74 | HR 92 | Wt 120.2 lb

## 2023-03-03 DIAGNOSIS — R42 Dizziness and giddiness: Secondary | ICD-10-CM | POA: Diagnosis not present

## 2023-03-03 DIAGNOSIS — O99119 Other diseases of the blood and blood-forming organs and certain disorders involving the immune mechanism complicating pregnancy, unspecified trimester: Secondary | ICD-10-CM

## 2023-03-03 DIAGNOSIS — O99013 Anemia complicating pregnancy, third trimester: Secondary | ICD-10-CM

## 2023-03-03 DIAGNOSIS — O0993 Supervision of high risk pregnancy, unspecified, third trimester: Secondary | ICD-10-CM

## 2023-03-03 DIAGNOSIS — O0990 Supervision of high risk pregnancy, unspecified, unspecified trimester: Secondary | ICD-10-CM

## 2023-03-03 DIAGNOSIS — O24415 Gestational diabetes mellitus in pregnancy, controlled by oral hypoglycemic drugs: Secondary | ICD-10-CM

## 2023-03-03 DIAGNOSIS — D696 Thrombocytopenia, unspecified: Secondary | ICD-10-CM

## 2023-03-03 DIAGNOSIS — Z8759 Personal history of other complications of pregnancy, childbirth and the puerperium: Secondary | ICD-10-CM

## 2023-03-03 DIAGNOSIS — O99012 Anemia complicating pregnancy, second trimester: Secondary | ICD-10-CM

## 2023-03-03 DIAGNOSIS — Z3A35 35 weeks gestation of pregnancy: Secondary | ICD-10-CM

## 2023-03-03 DIAGNOSIS — O99113 Other diseases of the blood and blood-forming organs and certain disorders involving the immune mechanism complicating pregnancy, third trimester: Secondary | ICD-10-CM

## 2023-03-03 MED ORDER — GLYBURIDE 2.5 MG PO TABS
2.5000 mg | ORAL_TABLET | Freq: Two times a day (BID) | ORAL | 0 refills | Status: DC
Start: 2023-03-03 — End: 2023-03-19

## 2023-03-03 MED ORDER — GLYBURIDE 2.5 MG PO TABS: 2.5000 mg | ORAL_TABLET | Freq: Two times a day (BID) | ORAL | 1 refills | Status: DC

## 2023-03-03 NOTE — Progress Notes (Unsigned)
   PRENATAL VISIT NOTE: Centering Pregnancy Group 12, Session 8   Subjective:  Shelly Koch is a 32 y.o. G3P2002 at [redacted]w[redacted]d being seen today for ongoing prenatal care.  She is currently monitored for the following issues for this high-risk pregnancy and has History of poor fetal growth; Gestational trophoblastic neoplasm; History of gestational hypertension; Supervision of high risk pregnancy, antepartum; History of pyelonephritis; History of gestational diabetes mellitus (GDM) in prior pregnancy, currently pregnant; Anemia during pregnancy in second trimester; Thrombocytopenia affecting pregnancy (HCC); and Gestational diabetes on their problem list.  Patient reports no complaints.  Contractions: Not present. Vag. Bleeding: None.  Movement: Present. Denies leaking of fluid.   The following portions of the patient's history were reviewed and updated as appropriate: allergies, current medications, past family history, past medical history, past social history, past surgical history and problem list.   Objective:   Vitals:   03/03/23 0908  BP: 109/74  Pulse: 92  Weight: 120 lb 3.2 oz (54.5 kg)    Fetal Status: Fetal Heart Rate (bpm): 155 Fundal Height: 25 cm Movement: Present  Presentation: Vertex  General:  Alert, oriented and cooperative. Patient is in no acute distress.  Skin: Skin is warm and dry. No rash noted.   Cardiovascular: Normal heart rate noted  Respiratory: Normal respiratory effort, no problems with respiration noted  Abdomen: Soft, gravid, appropriate for gestational age.  Pain/Pressure: Absent     Pelvic: Cervical exam deferred Dilation: 1 Effacement (%): Thick Station: -3  Extremities: Normal range of motion.     Mental Status: Normal mood and affect. Normal behavior. Normal judgment and thought content.   Assessment and Plan:  Pregnancy: G3P2002 at [redacted]w[redacted]d  1. Gestational diabetes mellitus (GDM) in third trimester controlled on oral hypoglycemic drug BG log reviews and  > 75% of values elevated.   Taking Metformin 1000mg  BID Given review of log I recommend starting insulin. Patient prefers to not do this and wants oral medications. Reviewed starting glyburide. Also discussed IOL for uncontrolled GDM. Patient agrees Plan for IOL at [redacted]w[redacted]d-- 8/23  2. Thrombocytopenia affecting pregnancy (HCC) Last valute 141  3. Anemia during pregnancy in second trimester Lab Results  Component Value Date   HGB 10.2 (L) 01/06/2023   HGB 10.0 (L) 11/21/2022  No recently values,   4. History of gestational hypertension BP WNL  5. Supervision of high risk pregnancy, antepartum   Centering Pregnancy, Session#8: Reviewed resources in [employer].   Facilitated discussion today:  breastfeeding  Fundal height and FHR appropriate today unless noted otherwise in plan. Patient to continue group care.   - Culture, beta strep (group b only) - GC/Chlamydia probe amp (Early)not at [facility]   Preterm labor symptoms and general obstetric precautions including but not limited to vaginal bleeding, contractions, leaking of fluid and fetal movement were reviewed in detail with the patient. Please refer to After Visit Summary for other counseling recommendations.   Return in about 2 weeks (around 03/17/2023) for Centering Pregnancy.  Future Appointments  Date Time Provider Department Center  03/10/2023  1:30 PM [facility] NURSE [facility]  03/10/2023  1:45 PM [facility] US4 [facility]  03/14/2023  7:15 AM MC-LD SCHED ROOM [facility]-INDC None  03/17/2023  9:00 AM CENTERING PROVIDER [facility]  03/31/2023  9:00 AM CENTERING PROVIDER [facility]    Federico Flake, MD

## 2023-03-03 NOTE — Progress Notes (Signed)
Received request for 90 day supply per Pharmacy. New RX sent.

## 2023-03-04 ENCOUNTER — Ambulatory Visit: Payer: Medicaid Other | Attending: Obstetrics

## 2023-03-04 ENCOUNTER — Ambulatory Visit: Payer: Medicaid Other | Admitting: *Deleted

## 2023-03-04 ENCOUNTER — Other Ambulatory Visit: Payer: Self-pay | Admitting: *Deleted

## 2023-03-04 VITALS — BP 105/67 | HR 90

## 2023-03-04 DIAGNOSIS — O0990 Supervision of high risk pregnancy, unspecified, unspecified trimester: Secondary | ICD-10-CM

## 2023-03-04 DIAGNOSIS — O24415 Gestational diabetes mellitus in pregnancy, controlled by oral hypoglycemic drugs: Secondary | ICD-10-CM | POA: Insufficient documentation

## 2023-03-04 DIAGNOSIS — D696 Thrombocytopenia, unspecified: Secondary | ICD-10-CM

## 2023-03-04 DIAGNOSIS — O09293 Supervision of pregnancy with other poor reproductive or obstetric history, third trimester: Secondary | ICD-10-CM

## 2023-03-04 DIAGNOSIS — Z3A35 35 weeks gestation of pregnancy: Secondary | ICD-10-CM

## 2023-03-04 DIAGNOSIS — D649 Anemia, unspecified: Secondary | ICD-10-CM

## 2023-03-04 DIAGNOSIS — O99013 Anemia complicating pregnancy, third trimester: Secondary | ICD-10-CM

## 2023-03-04 DIAGNOSIS — O99119 Other diseases of the blood and blood-forming organs and certain disorders involving the immune mechanism complicating pregnancy, unspecified trimester: Secondary | ICD-10-CM | POA: Diagnosis present

## 2023-03-04 DIAGNOSIS — O24419 Gestational diabetes mellitus in pregnancy, unspecified control: Secondary | ICD-10-CM

## 2023-03-04 DIAGNOSIS — Z8759 Personal history of other complications of pregnancy, childbirth and the puerperium: Secondary | ICD-10-CM | POA: Diagnosis present

## 2023-03-07 ENCOUNTER — Telehealth (HOSPITAL_COMMUNITY): Payer: Self-pay | Admitting: *Deleted

## 2023-03-07 ENCOUNTER — Encounter (HOSPITAL_COMMUNITY): Payer: Self-pay

## 2023-03-07 NOTE — Telephone Encounter (Signed)
Preadmission screen  

## 2023-03-10 ENCOUNTER — Other Ambulatory Visit (HOSPITAL_COMMUNITY): Payer: Self-pay | Admitting: Advanced Practice Midwife

## 2023-03-10 ENCOUNTER — Encounter: Payer: Self-pay | Admitting: *Deleted

## 2023-03-10 ENCOUNTER — Encounter (HOSPITAL_COMMUNITY): Payer: Self-pay | Admitting: *Deleted

## 2023-03-10 ENCOUNTER — Ambulatory Visit: Payer: Medicaid Other | Attending: Obstetrics

## 2023-03-10 ENCOUNTER — Ambulatory Visit: Payer: Medicaid Other | Admitting: *Deleted

## 2023-03-10 VITALS — BP 107/75 | HR 94

## 2023-03-10 DIAGNOSIS — O09293 Supervision of pregnancy with other poor reproductive or obstetric history, third trimester: Secondary | ICD-10-CM

## 2023-03-10 DIAGNOSIS — O24415 Gestational diabetes mellitus in pregnancy, controlled by oral hypoglycemic drugs: Secondary | ICD-10-CM

## 2023-03-10 DIAGNOSIS — O99013 Anemia complicating pregnancy, third trimester: Secondary | ICD-10-CM

## 2023-03-10 DIAGNOSIS — Z8759 Personal history of other complications of pregnancy, childbirth and the puerperium: Secondary | ICD-10-CM | POA: Diagnosis present

## 2023-03-10 DIAGNOSIS — O0990 Supervision of high risk pregnancy, unspecified, unspecified trimester: Secondary | ICD-10-CM

## 2023-03-10 DIAGNOSIS — D696 Thrombocytopenia, unspecified: Secondary | ICD-10-CM

## 2023-03-10 DIAGNOSIS — O99119 Other diseases of the blood and blood-forming organs and certain disorders involving the immune mechanism complicating pregnancy, unspecified trimester: Secondary | ICD-10-CM | POA: Insufficient documentation

## 2023-03-10 DIAGNOSIS — D649 Anemia, unspecified: Secondary | ICD-10-CM

## 2023-03-10 DIAGNOSIS — Z3A36 36 weeks gestation of pregnancy: Secondary | ICD-10-CM

## 2023-03-11 ENCOUNTER — Other Ambulatory Visit: Payer: Self-pay | Admitting: Family Medicine

## 2023-03-11 DIAGNOSIS — O24415 Gestational diabetes mellitus in pregnancy, controlled by oral hypoglycemic drugs: Secondary | ICD-10-CM

## 2023-03-12 ENCOUNTER — Other Ambulatory Visit: Payer: Self-pay | Admitting: Advanced Practice Midwife

## 2023-03-13 ENCOUNTER — Inpatient Hospital Stay (HOSPITAL_COMMUNITY)
Admission: AD | Admit: 2023-03-13 | Discharge: 2023-03-13 | Disposition: A | Discharge status: Home / Self Care | Payer: Medicaid Other | Attending: Obstetrics and Gynecology | Admitting: Obstetrics and Gynecology

## 2023-03-13 ENCOUNTER — Encounter (HOSPITAL_COMMUNITY): Payer: Self-pay | Admitting: Obstetrics and Gynecology

## 2023-03-13 DIAGNOSIS — O479 False labor, unspecified: Secondary | ICD-10-CM | POA: Diagnosis not present

## 2023-03-13 DIAGNOSIS — O471 False labor at or after 37 completed weeks of gestation: Secondary | ICD-10-CM | POA: Insufficient documentation

## 2023-03-13 DIAGNOSIS — Z3A37 37 weeks gestation of pregnancy: Secondary | ICD-10-CM | POA: Insufficient documentation

## 2023-03-13 NOTE — MAU Note (Signed)
..  Shelly Koch is a 32 y.o. at [redacted]w[redacted]d here in MAU reporting: contractions since this morning that are minutes apart. Denies vaginal bleeding or leaking of fluid.

## 2023-03-13 NOTE — MAU Provider Note (Signed)
S: [name] is a 31 y.o. G3P2002 at [redacted]w[redacted]d  who presents to MAU today complaining of frequent contractions since this morning. She denies vaginal bleeding. She denies LOF. She reports normal fetal movement.    O: BP 116/74 (BP Location: Right Arm)   Pulse 100   Temp 98.3 F (36.8 C) (Oral)   Resp 19   Ht 4\' 10"  (1.473 m)   Wt 55.8 kg   LMP 06/16/2022   SpO2 99%   BMI 25.73 kg/m  GENERAL: Well-developed, well-nourished female in no acute distress.  HEAD: Normocephalic, atraumatic.  CHEST: Normal effort of breathing, regular heart rate ABDOMEN: Soft, nontender, gravid  Cervical exam:  Dilation: 1.5 Effacement (%): 40 Station: -3 Presentation: Vertex Exam by:: [employer], RN   Fetal Monitoring: Baseline: 135 Variability: mod Accelerations: present Decelerations: absent Contractions: q12 min   A/P SIUP at [redacted]w[redacted]d presenting in early latent labor. Cervical exam 1.5cm. Pt given option for admission for latent labor or to return for her IOL tomorrow vs if labor progresses. She opted to go home at this time. Labor and precautions reviewed. Stable for d/c.    Sundra Aland, MD 03/13/2023 2:20 AM

## 2023-03-14 ENCOUNTER — Inpatient Hospital Stay (HOSPITAL_COMMUNITY): Payer: Medicaid Other

## 2023-03-15 ENCOUNTER — Inpatient Hospital Stay (HOSPITAL_COMMUNITY)
Admission: RE | Admit: 2023-03-15 | Discharge: 2023-03-19 | DRG: 768 | Disposition: A | Discharge status: Home / Self Care | Payer: Medicaid Other | Attending: Obstetrics & Gynecology | Admitting: Obstetrics & Gynecology

## 2023-03-15 ENCOUNTER — Encounter (HOSPITAL_COMMUNITY): Payer: Self-pay | Admitting: Family Medicine

## 2023-03-15 ENCOUNTER — Other Ambulatory Visit: Payer: Self-pay

## 2023-03-15 DIAGNOSIS — O24425 Gestational diabetes mellitus in childbirth, controlled by oral hypoglycemic drugs: Principal | ICD-10-CM | POA: Diagnosis present

## 2023-03-15 DIAGNOSIS — O134 Gestational [pregnancy-induced] hypertension without significant proteinuria, complicating childbirth: Secondary | ICD-10-CM | POA: Diagnosis present

## 2023-03-15 DIAGNOSIS — D696 Thrombocytopenia, unspecified: Secondary | ICD-10-CM | POA: Diagnosis present

## 2023-03-15 DIAGNOSIS — Z30017 Encounter for initial prescription of implantable subdermal contraceptive: Secondary | ICD-10-CM | POA: Diagnosis not present

## 2023-03-15 DIAGNOSIS — D62 Acute posthemorrhagic anemia: Secondary | ICD-10-CM | POA: Diagnosis not present

## 2023-03-15 DIAGNOSIS — O411230 Chorioamnionitis, third trimester, not applicable or unspecified: Secondary | ICD-10-CM | POA: Diagnosis present

## 2023-03-15 DIAGNOSIS — Z3A37 37 weeks gestation of pregnancy: Secondary | ICD-10-CM | POA: Diagnosis not present

## 2023-03-15 DIAGNOSIS — O9081 Anemia of the puerperium: Secondary | ICD-10-CM | POA: Diagnosis not present

## 2023-03-15 DIAGNOSIS — O24419 Gestational diabetes mellitus in pregnancy, unspecified control: Secondary | ICD-10-CM | POA: Diagnosis present

## 2023-03-15 DIAGNOSIS — O24415 Gestational diabetes mellitus in pregnancy, controlled by oral hypoglycemic drugs: Secondary | ICD-10-CM

## 2023-03-15 DIAGNOSIS — O9912 Other diseases of the blood and blood-forming organs and certain disorders involving the immune mechanism complicating childbirth: Secondary | ICD-10-CM | POA: Diagnosis present

## 2023-03-15 DIAGNOSIS — Z23 Encounter for immunization: Secondary | ICD-10-CM | POA: Diagnosis not present

## 2023-03-15 DIAGNOSIS — O24424 Gestational diabetes mellitus in childbirth, insulin controlled: Secondary | ICD-10-CM | POA: Diagnosis not present

## 2023-03-15 LAB — CBC
HCT: 34.5 % — ABNORMAL LOW (ref 36.0–46.0)
Hemoglobin: 11.4 g/dL — ABNORMAL LOW (ref 12.0–15.0)
MCH: 31.7 pg (ref 26.0–34.0)
MCHC: 33 g/dL (ref 30.0–36.0)
MCV: 95.8 fL (ref 80.0–100.0)
Platelets: 146 10*3/uL — ABNORMAL LOW (ref 150–400)
RBC: 3.6 MIL/uL — ABNORMAL LOW (ref 3.87–5.11)
RDW: 13.2 % (ref 11.5–15.5)
WBC: 9.7 10*3/uL (ref 4.0–10.5)
nRBC: 0 % (ref 0.0–0.2)

## 2023-03-15 LAB — GLUCOSE, CAPILLARY
Glucose-Capillary: 107 mg/dL — ABNORMAL HIGH (ref 70–99)
Glucose-Capillary: 106 mg/dL — ABNORMAL HIGH (ref 70–99)
Glucose-Capillary: 78 mg/dL (ref 70–99)
Glucose-Capillary: 80 mg/dL (ref 70–99)
Glucose-Capillary: 79 mg/dL (ref 70–99)

## 2023-03-15 LAB — RPR: RPR Ser Ql: NONREACTIVE

## 2023-03-15 MED ORDER — LACTATED RINGERS IV SOLN
500.0000 mL | INTRAVENOUS | Status: DC | PRN
  Administered 2023-03-16 (×2): 1000 mL via INTRAVENOUS

## 2023-03-15 MED ORDER — ACETAMINOPHEN 325 MG PO TABS
650.0000 mg | ORAL_TABLET | ORAL | Status: DC | PRN
  Administered 2023-03-15: 650 mg via ORAL
  Filled 2023-03-15: qty 2

## 2023-03-15 MED ORDER — OXYTOCIN BOLUS FROM INFUSION
333.0000 mL | Freq: Once | INTRAVENOUS | Status: AC
  Administered 2023-03-16: 333 mL via INTRAVENOUS

## 2023-03-15 MED ORDER — MISOPROSTOL 50MCG HALF TABLET
50.0000 ug | ORAL_TABLET | Freq: Once | ORAL | Status: AC
  Administered 2023-03-15: 50 ug via ORAL
  Filled 2023-03-15: qty 1

## 2023-03-15 MED ORDER — LACTATED RINGERS IV SOLN: INTRAVENOUS | Status: DC

## 2023-03-15 MED ORDER — OXYCODONE-ACETAMINOPHEN 5-325 MG PO TABS: 1.0000 | ORAL_TABLET | ORAL | Status: DC | PRN

## 2023-03-15 MED ORDER — OXYCODONE-ACETAMINOPHEN 5-325 MG PO TABS: 2.0000 | ORAL_TABLET | ORAL | Status: DC | PRN

## 2023-03-15 MED ORDER — SOD CITRATE-CITRIC ACID 500-334 MG/5ML PO SOLN: 30.0000 mL | ORAL | Status: DC | PRN

## 2023-03-15 MED ORDER — OXYTOCIN-SODIUM CHLORIDE 30-0.9 UT/500ML-% IV SOLN
2.5000 [IU]/h | INTRAVENOUS | Status: DC
  Administered 2023-03-16: 2.5 [IU]/h via INTRAVENOUS
  Filled 2023-03-15: qty 500

## 2023-03-15 MED ORDER — LIDOCAINE HCL (PF) 1 % IJ SOLN: 30.0000 mL | INTRAMUSCULAR | Status: DC | PRN

## 2023-03-15 MED ORDER — TERBUTALINE SULFATE 1 MG/ML IJ SOLN: 0.2500 mg | Freq: Once | INTRAMUSCULAR | Status: DC | PRN

## 2023-03-15 MED ORDER — OXYTOCIN-SODIUM CHLORIDE 30-0.9 UT/500ML-% IV SOLN
1.0000 m[IU]/min | INTRAVENOUS | Status: DC
  Administered 2023-03-15: 22 m[IU]/min via INTRAVENOUS
  Administered 2023-03-15: 2 m[IU]/min via INTRAVENOUS
  Filled 2023-03-15 (×3): qty 500

## 2023-03-15 MED ORDER — MISOPROSTOL 25 MCG QUARTER TABLET
25.0000 ug | ORAL_TABLET | Freq: Once | ORAL | Status: AC
  Administered 2023-03-15: 25 ug via VAGINAL
  Filled 2023-03-15: qty 1

## 2023-03-15 MED ORDER — FENTANYL CITRATE (PF) 100 MCG/2ML IJ SOLN
50.0000 ug | INTRAMUSCULAR | Status: DC | PRN
  Administered 2023-03-15: 100 ug via INTRAVENOUS
  Administered 2023-03-15: 50 ug via INTRAVENOUS
  Administered 2023-03-15 – 2023-03-16 (×3): 100 ug via INTRAVENOUS
  Administered 2023-03-16: 50 ug via INTRAVENOUS
  Filled 2023-03-15 (×6): qty 2

## 2023-03-15 NOTE — Progress Notes (Signed)
Shelly Koch is a 32 y.o. G3P2002 at [redacted]w[redacted]d by ultrasound admitted for induction of labor due to Gestational diabetes.  Subjective: Patient up walking the halls. Reports that contractions are getting stronger and closer together.   Objective: BP 123/80   Pulse (!) 104   Temp 98.4 F (36.9 C) (Oral)   Resp 16   LMP 06/16/2022   SpO2 100%  No intake/output data recorded. No intake/output data recorded.  FHT:  FHR: 135 bpm, variability: moderate,  accelerations:  Present,  decelerations:  Absent UC:   regular, every 2-3 minutes SVE:   Dilation: 3 Effacement (%): 70 Station: 0 Exam by:: Dorathy Daft, CNM  Labs: Lab Results  Component Value Date   WBC 9.7 03/15/2023   HGB 11.4 (L) 03/15/2023   HCT 34.5 (L) 03/15/2023   MCV 95.8 03/15/2023   PLT 146 (L) 03/15/2023  CBG (last 3)  Recent Labs    03/15/23 0829 03/15/23 1209 03/15/23 1602  GLUCAP 106* 78 80     Assessment / Plan: Induction of labor due to gestational diabetes,  progressing well on pitocin. CBGs stable at this time   Labor:  progressing on pit s/p AROM. Continue to titrate upwards as needed.  Fetal Wellbeing:  Category I Pain Control:  Labor support without medications and IV pain meds I/D:   GBS negative  Anticipated MOD:  NSVD  Claudette Head, CNM 03/15/2023, 4:57 PM

## 2023-03-15 NOTE — Progress Notes (Signed)
Shelly Koch is a 32 y.o. G3P2002 at [redacted]w[redacted]d by ultrasound admitted for induction of labor due to Gestational diabetes on Glyburide and Metformin. Hx of gHTN anemia and thrombocytopenia. .  Subjective: Patient up walking around room. Denies pain. Introductions exchanged.   Objective: BP 111/75   Pulse 99   Temp 97.9 F (36.6 C) (Oral)   Resp 16   LMP 06/16/2022   SpO2 100%  No intake/output data recorded. No intake/output data recorded.  FHT:  FHR: 135 bpm, variability: moderate,  accelerations:  Present,  decelerations:  Absent UC:   regular, every 2-3 minutes SVE:   Dilation: 2 Effacement (%): 60 Station: -2 Exam by:: Andrey Campanile, RN  Labs: Lab Results  Component Value Date   WBC 9.7 03/15/2023   HGB 11.4 (L) 03/15/2023   HCT 34.5 (L) 03/15/2023   MCV 95.8 03/15/2023   PLT 146 (L) 03/15/2023   Patient Vitals for the past 24 hrs:  BP Temp Temp src Pulse Resp SpO2  03/15/23 1010 123/73 -- -- 92 -- --  03/15/23 0900 117/78 -- -- (!) 101 -- --  03/15/23 0830 111/75 -- -- 99 -- --  03/15/23 0800 103/60 -- -- (!) 102 -- --  03/15/23 0728 117/75 97.9 F (36.6 C) Oral 98 16 100 %  03/15/23 0409 126/86 98.6 F (37 C) Oral (!) 109 16 --  03/15/23 0123 115/65 98.6 F (37 C) Oral (!) 105 16 --     Assessment / Plan: Induction of labor due to gestational diabetes, s/p FB and dual cytotec with pit.   Labor: Progressing on Pitocin, will continue to increase then AROM Preeclampsia:  labs stable and BP normotensive  Fetal Wellbeing:  Category I Pain Control:  Labor support without medications I/D:   GBS negative  Anticipated MOD:  NSVD  Claudette Head, CNM 03/15/2023, 8:32 AM

## 2023-03-15 NOTE — Progress Notes (Signed)
Late Entry given acuity on the Floor: Shelly Koch is a 32 y.o. G3P2002 at [redacted]w[redacted]d by ultrasound admitted for induction of labor due to Gestational diabetes.  Subjective: Patient doing well.  Objective: BP 122/81   Pulse (!) 102   Temp 98.4 F (36.9 C) (Oral)   Resp 16   LMP 06/16/2022   SpO2 100%  No intake/output data recorded. No intake/output data recorded.  FHT:  FHR: 135 bpm, variability: moderate,  accelerations:  Present,  decelerations:  Absent UC:   regular, every 2-3 minutes SVE:   Dilation: 3 Effacement (%): 70 Station: 0 Exam by:: Dorathy Daft, CNM  Labs: Lab Results  Component Value Date   WBC 9.7 03/15/2023   HGB 11.4 (L) 03/15/2023   HCT 34.5 (L) 03/15/2023   MCV 95.8 03/15/2023   PLT 146 (L) 03/15/2023   CNM to patient bedside to discuss replacing foley balloon given previous cervical exam of 2cm post expulsion. Patient agreed to plan of care. CNM attempted placement, however fetal head well applied and cervix too soft to hold in place. CNM gave the patient the option to AROM instead and patient agreeable to plan. Risks and benefits reviewed prior to AROM. CNM also offered patient IV pain meds and patient agreed to of fentanyl. SVE 2/80/-1/0 S. Suzie Portela CNM. FHT Cat I prior to AROM. AROM successful with clear fluid noted. FHT remained Cat I following  AROM.   Assessment / Plan: Induction of labor due to gestational diabetes,  progressing well on pitocin  Labor: Progressing normally. AROM completed. Continue to titrate pit up 2x2 as needed.  Fetal Wellbeing:  Category I Pain Control:  Labor support without medications and IV pain meds I/D:   GBS Negative  Anticipated MOD:  NSVD  Claudette Head, CNM 03/15/2023, 2:58 PM

## 2023-03-15 NOTE — H&P (Cosign Needed Addendum)
OBSTETRIC ADMISSION HISTORY AND PHYSICAL  Shelly Koch is a 32 y.o. female G3P2002 with IUP at [redacted]w[redacted]d (dated by 6 wk [place], Estimated Date of Delivery: 04/02/23) presenting for IOL for A2GDM. She reports +FMs, No LOF, no VB, no blurry vision, headaches or peripheral edema, and RUQ pain.  She is undecided on feeding plan. She is undecided on birth control.  She received her prenatal care at [facility]   U/S at [redacted]w[redacted]d: EFW 59% 3051gm, AC 79%.  Prenatal History/Complications: Gestational hypertension, A2GDM (on Metformin and Glyburide), Anemia, Thrombocytopenia  Past Medical History: Past Medical History:  Diagnosis Date   Anemia    Asherman syndrome    Asthma    as child   Chronic bilateral low back pain without sciatica 11/23/2018   Gestational diabetes    first preg   Gestational trophoblastic neoplasm    Headache    Irregular heart beat    has a heart monitor   Leukopenia due to antineoplastic chemotherapy (HCC)    PICC (peripherally inserted central catheter) in place 12/24/2014   Pyelonephritis    UTI (urinary tract infection)     Past Surgical History: Past Surgical History:  Procedure Laterality Date   DILATION AND EVACUATION N/A 08/10/2014   Procedure: DILATATION AND EVACUATION;  Surgeon: Willodean Rosenthal, MD;  Location: [facility];  Service: Gynecology;  Laterality: N/A;   DILATION AND EVACUATION N/A 10/08/2017   Procedure: DILATATION AND EVACUATION;  Surgeon: Catalina Antigua, MD;  Location: [facility];  Service: Gynecology;  Laterality: N/A;   HYSTEROSCOPY N/A 08/10/2014   Procedure: HYSTEROSCOPY;  Surgeon: Willodean Rosenthal, MD;  Location: [facility];  Service: Gynecology;  Laterality: N/A;    Obstetrical History: OB History     Gravida  3   Para  2   Term  2   Preterm      AB      Living  2      SAB      IAB      Ectopic      Multiple  0   Live Births  2        Obstetric Comments  76- started as twin preg, GDM         Social History Social  History   Socioeconomic History   Marital status: Single    Spouse name: Not on file   Number of children: Not on file   Years of education: Not on file   Highest education level: Not on file  Occupational History   Not on file  Tobacco Use   Smoking status: Never   Smokeless tobacco: Never  Vaping Use   Vaping status: Never Used  Substance and Sexual Activity   Alcohol use: Never   Drug use: Never   Sexual activity: Yes    Partners: Male    Comment: patient reports IC within past week  Other Topics Concern   Not on file  Social History Narrative   Works as a Radio broadcast assistant   Social Determinants of Corporate investment banker Strain: Not on file  Food Insecurity: No Food Insecurity (03/15/2023)   Hunger Vital Sign    Worried About Running Out of Food in the Last Year: Never true    Ran Out of Food in the Last Year: Never true  Transportation Needs: No Transportation Needs (03/15/2023)   PRAPARE - Administrator, Civil Service (Medical): No    Lack of Transportation (Non-Medical): No  Physical Activity: Not on file  Stress: Not on file  Social Connections: Not on file    Family History: Family History  Problem Relation Age of Onset   Healthy Mother    Healthy Father    Alcohol abuse Neg Hx    Asthma Neg Hx    Cancer Neg Hx    Diabetes Neg Hx    Heart disease Neg Hx    Hypertension Neg Hx     Allergies: Allergies  Allergen Reactions   Okra Itching    Hives and itching   Ondansetron Hives and Itching   Phenergan [Promethazine] Hives and Itching    Note: pt tolerates PO COMPAZINE   Zofran [Ondansetron Hcl] Rash    Medications Prior to Admission  Medication Sig Dispense Refill Last Dose   docusate sodium (COLACE) 50 MG capsule Take 50 mg by mouth daily as needed for mild constipation.   03/14/2023   famotidine (PEPCID) 20 MG tablet TAKE 1 TABLET(20 MG) BY MOUTH AT BEDTIME 90 tablet 2 03/14/2023   ferrous sulfate 325 (65 FE) MG EC tablet Take 1 tablet  (325 mg total) by mouth every other day. 45 tablet 1 03/14/2023   glyBURIDE (DIABETA) 2.5 MG tablet Take 1 tablet (2.5 mg total) by mouth 2 (two) times daily with a meal. 180 tablet 0 Past Week   metFORMIN (GLUCOPHAGE) 1000 MG tablet Take 1 tablet (1,000 mg total) by mouth 2 (two) times daily with a meal. 60 tablet 1 03/14/2023   Prenatal Vit w/Fe-Methylfol-FA (PNV-SELECT) 27-0.6-0.4 MG TABS Take 1 tablet by mouth daily. 30 tablet 12 03/14/2023   Accu-Chek Softclix Lancets lancets Use as instructed 100 each 12    acetaminophen (TYLENOL) 500 MG tablet Take 500 mg by mouth every 6 (six) hours as needed.      Blood Glucose Monitoring Suppl (ACCU-CHEK GUIDE) w/Device KIT 1 Device by Does not apply route as needed. 1 kit 0    Blood Pressure Monitoring DEVI 1 each by Does not apply route once a week. 1 each 0    glucose blood (ACCU-CHEK GUIDE) test strip Use as instructed 100 each 12      Review of Systems  All systems reviewed and negative except as stated in HPI.  Blood pressure 126/86, pulse (!) 109, temperature 98.6 F (37 C), temperature source Oral, resp. rate 16, last menstrual period 06/16/2022. General appearance: alert and cooperative Lungs: breathing comfortably Heart: regular rate and rhythm Abdomen: soft, non-tender; bowel sounds normal Pelvic: cervical os visibly dilated to 1cm on speculum exam Extremities: no edema of bilateral lower extremities Presentation: cephalic Fetal monitoring: 145/mod/+a/-d Uterine activity every 2-5 min Dilation: 1 Effacement (%): Thick Station: -3 Exam by:: Andrey Campanile, RN   Prenatal labs: ABO, Rh: --/--/AB POS (08/24 0134) Antibody: NEG (08/24 0134) Rubella: 1.11 (03/08 1115) RPR: Non Reactive (06/17 0843)  HBsAg: Negative (03/08 1115)  HIV: Non Reactive (06/17 0843)  GBS: Negative/-- (08/12 1135)  2 hr Glucola - GDM Genetic screening  NIPT low risk, female; carrier screen neg Anatomy US normal anatomy Last [place]: At [redacted]w[redacted]d - cephalic  presentation, EFW 3051gm (59 %tile), AC 79%  Prenatal Transfer Tool  Maternal Diabetes: Yes:  Diabetes Type:  Insulin/Medication controlled Genetic Screening: Normal Maternal Ultrasounds/Referrals: Normal Fetal Ultrasounds or other Referrals:  None Maternal Substance Abuse:  No Significant Maternal Medications:  Glyburide, Metformin Significant Maternal Lab Results:  Group B Strep negative Number of Prenatal Visits:greater than 3 verified prenatal visits Other Comments:  None  Results for orders placed or performed during  the hospital encounter of 03/15/23 (from the past 24 hour(s))  Type and screen   Collection Time: 03/15/23  1:34 AM  Result Value Ref Range   ABO/RH(D) AB POS    Antibody Screen NEG    Sample Expiration      03/18/2023,2359 Performed at [facility], [address], [place], [place] [address]   CBC   Collection Time: 03/15/23  1:36 AM  Result Value Ref Range   WBC 9.7 4.0 - 10.5 K/uL   RBC 3.60 (L) 3.87 - 5.11 MIL/uL   Hemoglobin 11.4 (L) 12.0 - 15.0 g/dL   HCT 76.2 (L) 83.1 - 51.7 %   MCV 95.8 80.0 - 100.0 fL   MCH 31.7 26.0 - 34.0 pg   MCHC 33.0 30.0 - 36.0 g/dL   RDW 61.6 07.3 - 71.0 %   Platelets 146 (L) 150 - 400 K/uL   nRBC 0.0 0.0 - 0.2 %  Glucose, capillary   Collection Time: 03/15/23  3:53 AM  Result Value Ref Range   Glucose-Capillary 107 (H) 70 - 99 mg/dL    Patient Active Problem List   Diagnosis Date Noted   Gestational diabetes mellitus (GDM) affecting pregnancy 03/15/2023   Gestational diabetes 02/13/2023   Thrombocytopenia affecting pregnancy (HCC) 01/25/2023   Anemia during pregnancy in second trimester 11/21/2022   History of gestational diabetes mellitus (GDM) in prior pregnancy, currently pregnant 11/04/2022   History of pyelonephritis 10/01/2022   Supervision of high risk pregnancy, antepartum 09/19/2022   History of gestational hypertension 03/05/2017   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   History of poor  fetal growth 06/04/2014    Assessment/Plan:  Shelly Koch is a 32 y.o. G3P2002 at [redacted]w[redacted]d here for IOL due to A2GDM.  #Labor: Cervix 1cm  s/p Cytotec 50/25 on arrival  foley balloon placed - filled to 40cc  anticipate SVD #Pain: IV pain meds #FWB: Cat I strip #ID:  Negative #MOF: formula #MOC: Nexplanon #Circ:  no  Sundra Aland, MD OB Fellow, Faculty Practice [facility], Center for [facility] 03/15/23 4:28 AM

## 2023-03-16 ENCOUNTER — Inpatient Hospital Stay (HOSPITAL_COMMUNITY): Payer: Medicaid Other | Admitting: Anesthesiology

## 2023-03-16 ENCOUNTER — Encounter (HOSPITAL_COMMUNITY): Payer: Self-pay | Admitting: Anesthesiology

## 2023-03-16 ENCOUNTER — Encounter (HOSPITAL_COMMUNITY): Payer: Self-pay | Admitting: Family Medicine

## 2023-03-16 DIAGNOSIS — O9912 Other diseases of the blood and blood-forming organs and certain disorders involving the immune mechanism complicating childbirth: Secondary | ICD-10-CM

## 2023-03-16 DIAGNOSIS — O134 Gestational [pregnancy-induced] hypertension without significant proteinuria, complicating childbirth: Secondary | ICD-10-CM

## 2023-03-16 DIAGNOSIS — O24424 Gestational diabetes mellitus in childbirth, insulin controlled: Secondary | ICD-10-CM

## 2023-03-16 DIAGNOSIS — Z3A37 37 weeks gestation of pregnancy: Secondary | ICD-10-CM

## 2023-03-16 LAB — CBC
HCT: 29.6 % — ABNORMAL LOW (ref 36.0–46.0)
HCT: 27.5 % — ABNORMAL LOW (ref 36.0–46.0)
Hemoglobin: 9.9 g/dL — ABNORMAL LOW (ref 12.0–15.0)
Hemoglobin: 9.3 g/dL — ABNORMAL LOW (ref 12.0–15.0)
MCH: 31.4 pg (ref 26.0–34.0)
MCH: 31.7 pg (ref 26.0–34.0)
MCHC: 33.4 g/dL (ref 30.0–36.0)
MCHC: 33.8 g/dL (ref 30.0–36.0)
MCV: 94 fL (ref 80.0–100.0)
MCV: 93.9 fL (ref 80.0–100.0)
Platelets: 114 10*3/uL — ABNORMAL LOW (ref 150–400)
Platelets: 101 10*3/uL — ABNORMAL LOW (ref 150–400)
RBC: 3.15 MIL/uL — ABNORMAL LOW (ref 3.87–5.11)
RBC: 2.93 MIL/uL — ABNORMAL LOW (ref 3.87–5.11)
RDW: 13.7 % (ref 11.5–15.5)
RDW: 13.9 % (ref 11.5–15.5)
WBC: 18.4 10*3/uL — ABNORMAL HIGH (ref 4.0–10.5)
WBC: 15.1 10*3/uL — ABNORMAL HIGH (ref 4.0–10.5)
nRBC: 0 % (ref 0.0–0.2)
nRBC: 0 % (ref 0.0–0.2)

## 2023-03-16 LAB — DIC (DISSEMINATED INTRAVASCULAR COAGULATION)PANEL
D-Dimer, Quant: 4.3 ug{FEU}/mL — ABNORMAL HIGH (ref 0.00–0.50)
Fibrinogen: 322 mg/dL (ref 210–475)
INR: 1.1 (ref 0.8–1.2)
Platelets: 126 10*3/uL — ABNORMAL LOW (ref 150–400)
Prothrombin Time: 14.8 seconds (ref 11.4–15.2)
Smear Review: NONE SEEN
aPTT: 24 seconds (ref 24–36)

## 2023-03-16 LAB — GLUCOSE, CAPILLARY
Glucose-Capillary: 147 mg/dL — ABNORMAL HIGH (ref 70–99)
Glucose-Capillary: 88 mg/dL (ref 70–99)

## 2023-03-16 LAB — PREPARE RBC (CROSSMATCH)

## 2023-03-16 MED ORDER — AMMONIA AROMATIC IN INHA
RESPIRATORY_TRACT | Status: AC
  Filled 2023-03-16: qty 10

## 2023-03-16 MED ORDER — TETANUS-DIPHTH-ACELL PERTUSSIS 5-2.5-18.5 LF-MCG/0.5 IM SUSY
0.5000 mL | PREFILLED_SYRINGE | Freq: Once | INTRAMUSCULAR | Status: AC
  Administered 2023-03-17: 0.5 mL via INTRAMUSCULAR
  Filled 2023-03-16: qty 0.5

## 2023-03-16 MED ORDER — PHENYLEPHRINE 80 MCG/ML (10ML) SYRINGE FOR IV PUSH (FOR BLOOD PRESSURE SUPPORT)
80.0000 ug | PREFILLED_SYRINGE | INTRAVENOUS | Status: DC | PRN
  Filled 2023-03-16: qty 10

## 2023-03-16 MED ORDER — LIDOCAINE HCL (PF) 1 % IJ SOLN
INTRAMUSCULAR | Status: DC | PRN
  Administered 2023-03-16: 5 mL via EPIDURAL

## 2023-03-16 MED ORDER — GENTAMICIN SULFATE 40 MG/ML IJ SOLN
5.0000 mg/kg | INTRAVENOUS | Status: DC
  Administered 2023-03-16: 280 mg via INTRAVENOUS
  Filled 2023-03-16 (×2): qty 7

## 2023-03-16 MED ORDER — OXYTOCIN-SODIUM CHLORIDE 30-0.9 UT/500ML-% IV SOLN
INTRAVENOUS | Status: AC
  Filled 2023-03-16: qty 500

## 2023-03-16 MED ORDER — CLINDAMYCIN PHOSPHATE 900 MG/50ML IV SOLN
900.0000 mg | Freq: Three times a day (TID) | INTRAVENOUS | Status: DC
  Administered 2023-03-16 – 2023-03-17 (×3): 900 mg via INTRAVENOUS
  Filled 2023-03-16 (×3): qty 50

## 2023-03-16 MED ORDER — FERROUS SULFATE 325 (65 FE) MG PO TABS
325.0000 mg | ORAL_TABLET | ORAL | Status: DC
  Administered 2023-03-16 – 2023-03-18 (×2): 325 mg via ORAL
  Filled 2023-03-16 (×2): qty 1

## 2023-03-16 MED ORDER — WITCH HAZEL-GLYCERIN EX PADS: 1.0000 | MEDICATED_PAD | CUTANEOUS | Status: DC | PRN

## 2023-03-16 MED ORDER — BENZOCAINE-MENTHOL 20-0.5 % EX AERO
1.0000 | INHALATION_SPRAY | CUTANEOUS | Status: DC | PRN
  Administered 2023-03-16: 1 via TOPICAL
  Filled 2023-03-16: qty 56

## 2023-03-16 MED ORDER — TRANEXAMIC ACID-NACL 1000-0.7 MG/100ML-% IV SOLN: 1000.0000 mg | Freq: Once | INTRAVENOUS | Status: DC | PRN

## 2023-03-16 MED ORDER — DIPHENHYDRAMINE HCL 25 MG PO CAPS: 25.0000 mg | ORAL_CAPSULE | Freq: Four times a day (QID) | ORAL | Status: DC | PRN

## 2023-03-16 MED ORDER — EPHEDRINE 5 MG/ML INJ: 10.0000 mg | INTRAVENOUS | Status: DC | PRN

## 2023-03-16 MED ORDER — LACTATED RINGERS IV SOLN
500.0000 mL | Freq: Once | INTRAVENOUS | Status: DC
  Administered 2023-03-16: 1000 mL via INTRAVENOUS

## 2023-03-16 MED ORDER — SENNOSIDES-DOCUSATE SODIUM 8.6-50 MG PO TABS
2.0000 | ORAL_TABLET | Freq: Every day | ORAL | Status: DC
  Administered 2023-03-17 – 2023-03-19 (×3): 2 via ORAL
  Filled 2023-03-16 (×3): qty 2

## 2023-03-16 MED ORDER — TRANEXAMIC ACID-NACL 1000-0.7 MG/100ML-% IV SOLN: 1000.0000 mg | INTRAVENOUS | Status: DC

## 2023-03-16 MED ORDER — METHYLERGONOVINE MALEATE 0.2 MG/ML IJ SOLN
0.2000 mg | INTRAMUSCULAR | Status: DC | PRN
  Administered 2023-03-16: 0.2 mg via INTRAMUSCULAR
  Filled 2023-03-16: qty 1

## 2023-03-16 MED ORDER — MISOPROSTOL 200 MCG PO TABS
1000.0000 ug | ORAL_TABLET | Freq: Once | ORAL | Status: AC
  Administered 2023-03-16: 1000 ug via RECTAL

## 2023-03-16 MED ORDER — CEFAZOLIN SODIUM-DEXTROSE 2-4 GM/100ML-% IV SOLN
2.0000 g | Freq: Once | INTRAVENOUS | Status: AC
  Administered 2023-03-16: 2 g via INTRAVENOUS
  Filled 2023-03-16: qty 100

## 2023-03-16 MED ORDER — OXYCODONE HCL 5 MG PO TABS
5.0000 mg | ORAL_TABLET | ORAL | Status: DC | PRN
  Administered 2023-03-17: 5 mg via ORAL
  Filled 2023-03-16: qty 1

## 2023-03-16 MED ORDER — TRANEXAMIC ACID-NACL 1000-0.7 MG/100ML-% IV SOLN
1000.0000 mg | Freq: Once | INTRAVENOUS | Status: AC
  Administered 2023-03-16: 1000 mg via INTRAVENOUS
  Filled 2023-03-16: qty 100

## 2023-03-16 MED ORDER — SODIUM CHLORIDE 0.9% IV SOLUTION: Freq: Once | INTRAVENOUS | Status: DC

## 2023-03-16 MED ORDER — FENTANYL-BUPIVACAINE-NACL 0.5-0.125-0.9 MG/250ML-% EP SOLN
12.0000 mL/h | EPIDURAL | Status: DC | PRN
  Filled 2023-03-16: qty 250

## 2023-03-16 MED ORDER — SIMETHICONE 80 MG PO CHEW: 80.0000 mg | CHEWABLE_TABLET | ORAL | Status: DC | PRN

## 2023-03-16 MED ORDER — MISOPROSTOL 200 MCG PO TABS
ORAL_TABLET | ORAL | Status: AC
  Filled 2023-03-16: qty 5

## 2023-03-16 MED ORDER — ACETAMINOPHEN 325 MG PO TABS
650.0000 mg | ORAL_TABLET | ORAL | Status: DC | PRN
  Administered 2023-03-16 – 2023-03-19 (×7): 650 mg via ORAL
  Filled 2023-03-16 (×7): qty 2

## 2023-03-16 MED ORDER — OXYCODONE HCL 5 MG PO TABS
10.0000 mg | ORAL_TABLET | ORAL | Status: DC | PRN
  Administered 2023-03-16 – 2023-03-18 (×4): 10 mg via ORAL
  Filled 2023-03-16 (×4): qty 2

## 2023-03-16 MED ORDER — TRANEXAMIC ACID-NACL 1000-0.7 MG/100ML-% IV SOLN
INTRAVENOUS | Status: AC
  Administered 2023-03-16: 1000 mg
  Filled 2023-03-16: qty 100

## 2023-03-16 MED ORDER — FENTANYL-BUPIVACAINE-NACL 0.5-0.125-0.9 MG/250ML-% EP SOLN
EPIDURAL | Status: DC | PRN
  Administered 2023-03-16: 10 mL/h via EPIDURAL

## 2023-03-16 MED ORDER — LACTATED RINGERS IV SOLN: INTRAVENOUS | Status: DC

## 2023-03-16 MED ORDER — ZOLPIDEM TARTRATE 5 MG PO TABS: 5.0000 mg | ORAL_TABLET | Freq: Every evening | ORAL | Status: DC | PRN

## 2023-03-16 MED ORDER — PHENYLEPHRINE 80 MCG/ML (10ML) SYRINGE FOR IV PUSH (FOR BLOOD PRESSURE SUPPORT): 80.0000 ug | PREFILLED_SYRINGE | INTRAVENOUS | Status: DC | PRN

## 2023-03-16 MED ORDER — DIBUCAINE (PERIANAL) 1 % EX OINT: 1.0000 | TOPICAL_OINTMENT | CUTANEOUS | Status: DC | PRN

## 2023-03-16 MED ORDER — DIPHENHYDRAMINE HCL 50 MG/ML IJ SOLN: 12.5000 mg | INTRAMUSCULAR | Status: DC | PRN

## 2023-03-16 MED ORDER — COCONUT OIL OIL: 1.0000 | TOPICAL_OIL | Status: DC | PRN

## 2023-03-16 MED ORDER — IBUPROFEN 600 MG PO TABS
600.0000 mg | ORAL_TABLET | Freq: Four times a day (QID) | ORAL | Status: DC
  Administered 2023-03-16 – 2023-03-19 (×13): 600 mg via ORAL
  Filled 2023-03-16 (×13): qty 1

## 2023-03-16 MED ORDER — METHYLERGONOVINE MALEATE 0.2 MG PO TABS: 0.2000 mg | ORAL_TABLET | ORAL | Status: DC | PRN

## 2023-03-16 MED ORDER — PRENATAL MULTIVITAMIN CH
1.0000 | ORAL_TABLET | Freq: Every day | ORAL | Status: DC
  Administered 2023-03-16 – 2023-03-19 (×4): 1 via ORAL
  Filled 2023-03-16 (×4): qty 1

## 2023-03-16 NOTE — Plan of Care (Signed)
Problem: Health Behavior/Discharge Planning: Goal: Ability to manage health-related needs will improve 03/16/2023 1729 by Alesia Richards, RN Outcome: Progressing 03/16/2023 1458 by Alesia Richards, RN Outcome: Progressing   Problem: Clinical Measurements: Goal: Ability to maintain clinical measurements within normal limits will improve 03/16/2023 1729 by Alesia Richards, RN Outcome: Progressing 03/16/2023 1458 by Alesia Richards, RN Outcome: Progressing Goal: Will remain free from infection 03/16/2023 1729 by Alesia Richards, RN Outcome: Progressing 03/16/2023 1458 by Alesia Richards, RN Outcome: Progressing Goal: Diagnostic test results will improve 03/16/2023 1729 by Alesia Richards, RN Outcome: Progressing 03/16/2023 1458 by Alesia Richards, RN Outcome: Progressing Goal: Respiratory complications will improve 03/16/2023 1729 by Alesia Richards, RN Outcome: Progressing 03/16/2023 1458 by Alesia Richards, RN Outcome: Progressing Goal: Cardiovascular complication will be avoided 03/16/2023 1729 by Alesia Richards, RN Outcome: Progressing 03/16/2023 1458 by Alesia Richards, RN Outcome: Progressing   Problem: Activity: Goal: Risk for activity intolerance will decrease 03/16/2023 1729 by Alesia Richards, RN Outcome: Progressing 03/16/2023 1458 by Alesia Richards, RN Outcome: Progressing   Problem: Nutrition: Goal: Adequate nutrition will be maintained 03/16/2023 1729 by Alesia Richards, RN Outcome: Progressing 03/16/2023 1458 by Alesia Richards, RN Outcome: Progressing   Problem: Coping: Goal: Level of anxiety will decrease 03/16/2023 1729 by Alesia Richards, RN Outcome: Progressing 03/16/2023 1458 by Alesia Richards, RN Outcome: Progressing   Problem: Elimination: Goal: Will not experience complications related to bowel motility 03/16/2023 1729 by Alesia Richards, RN Outcome: Progressing 03/16/2023 1458 by Alesia Richards, RN Outcome: Progressing Goal: Will not experience complications related  to urinary retention 03/16/2023 1729 by Alesia Richards, RN Outcome: Progressing 03/16/2023 1458 by Alesia Richards, RN Outcome: Progressing   Problem: Pain Managment: Goal: General experience of comfort will improve 03/16/2023 1729 by Alesia Richards, RN Outcome: Progressing 03/16/2023 1458 by Alesia Richards, RN Outcome: Progressing   Problem: Safety: Goal: Ability to remain free from injury will improve 03/16/2023 1729 by Alesia Richards, RN Outcome: Progressing 03/16/2023 1458 by Alesia Richards, RN Outcome: Progressing   Problem: Skin Integrity: Goal: Risk for impaired skin integrity will decrease 03/16/2023 1729 by Alesia Richards, RN Outcome: Progressing 03/16/2023 1458 by Alesia Richards, RN Outcome: Progressing   Problem: Education: Goal: Knowledge of condition will improve 03/16/2023 1729 by Alesia Richards, RN Outcome: Progressing 03/16/2023 1458 by Alesia Richards, RN Outcome: Progressing Goal: Individualized Educational Video(s) 03/16/2023 1729 by Alesia Richards, RN Outcome: Progressing 03/16/2023 1458 by Alesia Richards, RN Outcome: Progressing Goal: Individualized Newborn Educational Video(s) 03/16/2023 1729 by Alesia Richards, RN Outcome: Progressing 03/16/2023 1458 by Alesia Richards, RN Outcome: Progressing   Problem: Activity: Goal: Will verbalize the importance of balancing activity with adequate rest periods 03/16/2023 1729 by Alesia Richards, RN Outcome: Progressing 03/16/2023 1458 by Alesia Richards, RN Outcome: Progressing Goal: Ability to tolerate increased activity will improve 03/16/2023 1729 by Alesia Richards, RN Outcome: Progressing 03/16/2023 1458 by Alesia Richards, RN Outcome: Progressing   Problem: Coping: Goal: Ability to identify and utilize available resources and services will improve 03/16/2023 1729 by Alesia Richards, RN Outcome: Progressing 03/16/2023 1458 by Alesia Richards, RN Outcome: Progressing   Problem: Life Cycle: Goal: Chance of risk for  complications during the postpartum period will decrease 03/16/2023 1729 by Alesia Richards, RN Outcome: Progressing 03/16/2023 1458 by Alesia Richards, RN Outcome: Progressing  Problem: Role Relationship: Goal: Ability to demonstrate positive interaction with newborn will improve 03/16/2023 1729 by Alesia Richards, RN Outcome: Progressing 03/16/2023 1458 by Alesia Richards, RN Outcome: Progressing   Problem: Skin Integrity: Goal: Demonstration of wound healing without infection will improve 03/16/2023 1729 by Alesia Richards, RN Outcome: Progressing 03/16/2023 1458 by Alesia Richards, RN Outcome: Progressing

## 2023-03-16 NOTE — Lactation Note (Signed)
This note was copied from a baby's chart. Lactation Consultation Note Mom chooses to formula feed.  Patient Name: Shelly Koch [id number] Date: 03/16/2023 Age:34 hours     Maternal Data    Feeding Nipple Type: Slow - flow  LATCH Score                    Lactation Tools Discussed/Used    Interventions    Discharge    Consult Status Consult Status: Complete    [name] 03/16/2023, 7:06 PM

## 2023-03-16 NOTE — Anesthesia Preprocedure Evaluation (Addendum)
Anesthesia Evaluation  Patient identified by MRN, date of birth, ID band Patient awake    Reviewed: Allergy & Precautions, NPO status , Patient's Chart, lab work & pertinent test results  Airway Mallampati: II  TM Distance: >3 FB Neck ROM: Full    Dental no notable dental hx.    Pulmonary    Pulmonary exam normal breath sounds clear to auscultation       Cardiovascular negative cardio ROS Normal cardiovascular exam Rhythm:Regular Rate:Normal     Neuro/Psych    GI/Hepatic Neg liver ROS,,,  Endo/Other  diabetes, Gestational    Renal/GU negative Renal ROS     Musculoskeletal   Abdominal   Peds  Hematology  (+) Blood dyscrasia Lab Results      Component                Value               Date                      WBC                      9.7                 03/15/2023                HGB                      11.4 (L)            03/15/2023                HCT                      34.5 (L)            03/15/2023                MCV                      95.8                03/15/2023                PLT                      146 (L)             03/15/2023              Anesthesia Other Findings   Reproductive/Obstetrics (+) Pregnancy                             Anesthesia Physical Anesthesia Plan  ASA: 3  Anesthesia Plan: Epidural   Post-op Pain Management:    Induction:   PONV Risk Score and Plan:   Airway Management Planned:   Additional Equipment:   Intra-op Plan:   Post-operative Plan:   Informed Consent: I have reviewed the patients History and Physical, chart, labs and discussed the procedure including the risks, benefits and alternatives for the proposed anesthesia with the patient or authorized representative who has indicated his/her understanding and acceptance.       Plan Discussed with:   Anesthesia Plan Comments: (37.4 wk G3P2 w gest thrombocytopenia for LEA)        Anesthesia Quick Evaluation

## 2023-03-16 NOTE — Anesthesia Procedure Notes (Signed)
Epidural Patient location during procedure: OB Start time: 03/16/2023 1:42 AM End time: 03/16/2023 2:00 AM  Staffing Anesthesiologist: Trevor Iha, MD Performed: anesthesiologist   Preanesthetic Checklist Completed: patient identified, IV checked, site marked, risks and benefits discussed, surgical consent, monitors and equipment checked, pre-op evaluation and timeout performed  Epidural Patient position: sitting Prep: DuraPrep and site prepped and draped Patient monitoring: continuous pulse ox and blood pressure Approach: midline Location: L3-L4 Injection technique: LOR air  Needle:  Needle type: Tuohy  Needle gauge: 17 G Needle length: 9 cm and 9 Needle insertion depth: 4 cm Catheter type: closed end flexible Catheter size: 19 Gauge Catheter at skin depth: 9 cm Test dose: negative  Assessment Events: blood not aspirated, no cerebrospinal fluid, injection not painful, no injection resistance, no paresthesia and negative IV test  Additional Notes Patient identified. Risks/Benefits/Options discussed with patient including but not limited to bleeding, infection, nerve damage, paralysis, failed block, incomplete pain control, headache, blood pressure changes, nausea, vomiting, reactions to medication both or allergic, itching and postpartum back pain. Confirmed with bedside nurse the patient's most recent platelet count. Confirmed with patient that they are not currently taking any anticoagulation, have any bleeding history or any family history of bleeding disorders. Patient expressed understanding and wished to proceed. All questions were answered. Sterile technique was used throughout the entire procedure. Please see nursing notes for vital signs. Test dose was given through epidural needle and negative prior to continuing to dose epidural or start infusion. Warning signs of high block given to the patient including shortness of breath, tingling/numbness in hands, complete motor  block, or any concerning symptoms with instructions to call for help. Patient was given instructions on fall risk and not to get out of bed. All questions and concerns addressed with instructions to call with any issues. 1 Attempt (S) . Patient tolerated procedure well.

## 2023-03-16 NOTE — Progress Notes (Signed)
Faculty Note  S: Patient feeling okay, with some pain.   O: BP 110/68   Pulse (!) 106   Temp 99.7 F (37.6 C) (Oral)   Resp 16   LMP 06/16/2022   SpO2 99%   Breastfeeding Unknown   Gen: alert, oriented Abd: fundus firm below uterus SVE: Jada removed, no bleeding noted     Latest Ref Rng & Units 03/16/2023   12:09 PM 03/16/2023    8:27 AM 03/15/2023    1:36 AM  CBC  WBC 4.0 - 10.5 K/uL 18.4   9.7   Hemoglobin 12.0 - 15.0 g/dL 9.9   16.1   Hematocrit 36.0 - 46.0 % 29.6   34.5   Platelets 150 - 400 K/uL 114  SPECIMEN CLOTTED  146     A/P: Pt is 32 y.o. G3P3003 @ [redacted]w[redacted]d PPD#0 s/p vaginal delivery complicated by retained placenta requiring manual removal with bedside D&C under ultrasound guidance with uterine atony. EBL 1700. mL. Mel Almond now removed with minimal bleeding. S/p 1 unit pRBC  DIC/CBC pending Cont methergine series Cont pitocin Will base further transfusion on labs To OBSC   K. Therese Sarah, MD, [facility] Attending Center for [facility])

## 2023-03-16 NOTE — Progress Notes (Signed)
Shelly Koch is a 32 y.o. G3P2002 at [redacted]w[redacted]d by ultrasound admitted for induction of labor due to Gestational diabetes and Hypertension.  Subjective: Pt is tired, has been laboring for almost 24 hours.  Coping with contractions well with nitrous oxide.  Does not desire epidural at this time but is considering if labor does not progress. Husband at bedside for support.  Objective: BP 98/65   Pulse (!) 101   Temp 98.2 F (36.8 C) (Oral)   Resp 16   LMP 06/16/2022   SpO2 100%  No intake/output data recorded. No intake/output data recorded.  FHT:  FHR: 135 bpm, variability: moderate,  accelerations:  Present,  decelerations:  Absent UC:   regular, every 3 minutes SVE:   Dilation: 6 Effacement (%): 80 Station: -2 Exam by:: L. Leftwich Craige Cotta, CNM  Labs: Lab Results  Component Value Date   WBC 9.7 03/15/2023   HGB 11.4 (L) 03/15/2023   HCT 34.5 (L) 03/15/2023   MCV 95.8 03/15/2023   PLT 146 (L) 03/15/2023    Assessment / Plan: Induction of labor due to gestational hypertension and gestational diabetes  Labor:  Protracted active phase of labor, large amount of caput on exam, and fetal head not well applied to cervix.  OP position confirmed by bedside US. Pt position changed to exaggerated left lateral position to encourage fetal rotation.  Preeclampsia:  labs stable Fetal Wellbeing:  Category I Pain Control:  IV pain meds and Nitrous Oxide I/D:   GBS neg Anticipated MOD:  NSVD  Sharen Counter, CNM 03/16/2023, 12:58 AM

## 2023-03-16 NOTE — Progress Notes (Addendum)
Faculty Note  S: Patient feeling somewhat light-headed and has not been out of bed yet. States she is bleeding about as much as a period.   O: BP (!) 97/50 (BP Location: Right Arm)   Pulse 86   Temp 98.6 F (37 C) (Oral)   Resp 17   LMP 06/16/2022   SpO2 98%   Breastfeeding Unknown   Gen: alert, oriented GU: pad with some bright red blood   A/P: Pt is 32 y.o. G3P3003 @ [redacted]w[redacted]d PPD#0 s/p vaginal delivery complicated by retained placenta requiring manual removal with bedside D&C under ultrasound guidance with uterine atony. EBL 1700. mL. Mel Almond now removed with minimal bleeding. S/p 1 unit pRBC. Patient not feeling well, will obtain stat CBC and likely need another unit blood, to which they are agreeable. Patient febrile and clinda/gent started previously   CBC pending Cont methergine series Cont clinda/gent   K. Therese Sarah, MD, [facility] Attending Center for [facility])

## 2023-03-16 NOTE — Discharge Summary (Signed)
Postpartum Discharge Summary  Date of Service updated: 03/19/23     Patient Name: Shelly Koch DOB: [birth year] MRN: [id number]  Date of admission: 03/15/2023 Delivery date:03/16/2023 Delivering provider: Sharen Counter A Date of discharge: 03/19/2023  Admitting diagnosis: Gestational diabetes mellitus (GDM) affecting pregnancy [O24.419] Intrauterine pregnancy: [redacted]w[redacted]d     Secondary diagnosis:  Principal Problem:   Gestational diabetes mellitus (GDM) affecting pregnancy Active Problems:   Vaginal delivery   Postpartum hemorrhage   Retained placenta  Additional problems:  none   Discharge diagnosis: Term Pregnancy Delivered                                              Post partum procedures:blood transfusion Augmentation: AROM, Pitocin, Cytotec, and IP Foley Complications: Hemorrhage>1039mL  Hospital course: Induction of Labor With Vaginal Delivery   32 y.o. yo G3P3003 at [redacted]w[redacted]d was admitted to the hospital 03/15/2023 for induction of labor.  Indication for induction: A2 GDM.  Patient had an labor course complicated by nothing. Membrane Rupture Time/Date: 1:08 PM,03/15/2023  Delivery Method:Vaginal, Spontaneous Operative Delivery:N/A Episiotomy: None Lacerations:  1st degree;Perineal Details of delivery can be found in separate delivery note.  Patient had a postpartum course complicated by retained placenta with manual extraction, bedside curettage and JADA placement due to postpartum hemorrhage. Patient received 1 unit of PRBC and did well afterwards.  There was some small concern for endometritis so patient was started on oral amoxicillin for coverage. Patient is discharged home 03/19/23.  Newborn Data: Birth date:03/16/2023 Birth time:5:25 AM Gender:Female Living status:Living Apgars:8 ,9  Weight:2890 g  Magnesium Sulfate received: No BMZ received: No Rhophylac:No MMR:No T-DaP:Given prenatally Flu: Yes Transfusion:Yes  Physical exam  Vitals:   03/18/23 1813  03/18/23 1951 03/19/23 0518 03/19/23 0745  BP: (!) 131/94 131/66 122/64 137/70  Pulse: 79 74 65 62  Resp:  16 15 17   Temp: 98.3 F (36.8 C) 98.4 F (36.9 C) 98.3 F (36.8 C) 98.3 F (36.8 C)  TempSrc: Oral Oral Oral Oral  SpO2: 100% 99% 100% 99%   General: alert, cooperative, and no distress Lochia: appropriate Uterine Fundus: firm and mildly tender Incision: N/A DVT Evaluation: No evidence of DVT seen on physical exam. Negative Homan's sign. Labs: Lab Results  Component Value Date   WBC 8.9 03/18/2023   HGB 9.2 (L) 03/18/2023   HCT 27.8 (L) 03/18/2023   MCV 95.9 03/18/2023   PLT 116 (L) 03/18/2023      Latest Ref Rng & Units 02/13/2023    8:46 PM  CMP  Glucose 70 - 99 mg/dL 409   BUN 6 - 20 mg/dL <5   Creatinine 8.11 - 1.00 mg/dL 9.14   Sodium 782 - 956 mmol/L 137   Potassium 3.5 - 5.1 mmol/L 3.5   Chloride 98 - 111 mmol/L 101   CO2 22 - 32 mmol/L 23   Calcium 8.9 - 10.3 mg/dL 8.7   Total Protein 6.5 - 8.1 g/dL 5.7   Total Bilirubin 0.3 - 1.2 mg/dL 0.6   Alkaline Phos 38 - 126 U/L 111   AST 15 - 41 U/L 18   ALT 0 - 44 U/L 11    Edinburgh Score:    03/18/2023    3:35 PM  Edinburgh Postnatal Depression Scale Screening Tool  I have been able to laugh and see the funny side of things. 0  I have looked forward with enjoyment to things. 0  I have blamed myself unnecessarily when things went wrong. 0  I have been anxious or worried for no good reason. 0  I have felt scared or panicky for no good reason. 0  Things have been getting on top of me. 0  I have been so unhappy that I have had difficulty sleeping. 0  I have felt sad or miserable. 0  I have been so unhappy that I have been crying. 0  The thought of harming myself has occurred to me. 0  Edinburgh Postnatal Depression Scale Total 0     After visit meds:  Allergies as of 03/19/2023       Reactions   Okra Itching   Hives and itching   Ondansetron Hives, Itching   Phenergan [promethazine] Hives,  Itching   Note: pt tolerates PO COMPAZINE   Zofran [ondansetron Hcl] Rash        Medication List     STOP taking these medications    Accu-Chek Guide test strip Generic drug: glucose blood   Accu-Chek Guide w/Device Kit   Accu-Chek Softclix Lancets lancets   Blood Pressure Monitoring Devi   ferrous sulfate 325 (65 FE) MG EC tablet   glyBURIDE 2.5 MG tablet Commonly known as: DIABETA   metFORMIN 1000 MG tablet Commonly known as: GLUCOPHAGE       TAKE these medications    acetaminophen 500 MG tablet Commonly known as: TYLENOL Take 500 mg by mouth every 6 (six) hours as needed.   amoxicillin-clavulanate 875-125 MG tablet Commonly known as: AUGMENTIN Take 1 tablet by mouth every 12 (twelve) hours for 10 days.   docusate sodium 50 MG capsule Commonly known as: COLACE Take 50 mg by mouth daily as needed for mild constipation.   famotidine 20 MG tablet Commonly known as: PEPCID TAKE 1 TABLET(20 MG) BY MOUTH AT BEDTIME   ibuprofen 600 MG tablet Commonly known as: ADVIL Take 1 tablet (600 mg total) by mouth every 6 (six) hours.   oxyCODONE 5 MG immediate release tablet Commonly known as: Oxy IR/ROXICODONE Take 1 tablet (5 mg total) by mouth every 4 (four) hours as needed for moderate pain.   PNV-Select 27-0.6-0.4 MG Tabs Take 1 tablet by mouth daily.         Discharge home in stable condition Infant Feeding: Bottle Infant Disposition:home with mother Discharge instruction: per After Visit Summary and Postpartum booklet. Activity: Advance as tolerated. Pelvic rest for 6 weeks.  Diet: carb modified diet Future Appointments:No future appointments. Follow up Visit:  Follow-up Information     Center for [facility] at [facility]. Schedule an appointment as soon as possible for a visit in 1 week(s).   Specialty: Obstetrics and Gynecology Why: BP check/ pain check from endometritis Contact information: [address] [place] [address] [phone]        Center for [employer] at [facility] for [facility]. Schedule an appointment as soon as possible for a visit in 1 month(s).   Specialty: Obstetrics and Gynecology Why: postpartum with follow up GTT Contact information: [address] [place] [address] [phone]                Message sent to [facility] on 03/16/23  Please schedule this patient for a In person postpartum visit in 6 weeks with the following provider:  Sharen Counter if possible, or Centering provider . Additional Postpartum F/U:2 hour  GTT  High risk pregnancy complicated by: GDM Delivery mode:  Vaginal, Spontaneous Anticipated Birth Control:  Nexplanon inpatient   03/19/2023 Warden Fillers, MD

## 2023-03-16 NOTE — Progress Notes (Signed)
Pharmacy Antibiotic Note  Shelly Koch is a 32 y.o. female admitted on 03/15/2023 with  endometritis .  Pharmacy has been consulted for gentamicin dosing.  Plan: Gentamicin 5 mg/kg IV q24h  Clindamycin 900 mg IV q8h     Temp (24hrs), Avg:98.9 F (37.2 C), Min:97.3 F (36.3 C), Max:100.7 F (38.2 C)  Recent Labs  Lab 03/15/23 0136 03/16/23 1209  WBC 9.7 18.4*    CrCl cannot be calculated (Patient's most recent lab result is older than the maximum 21 days allowed.).    Allergies  Allergen Reactions   Okra Itching    Hives and itching   Ondansetron Hives and Itching   Phenergan [Promethazine] Hives and Itching    Note: pt tolerates PO COMPAZINE   Zofran [Ondansetron Hcl] Rash    Antimicrobials this admission: Ancef 8/25 x1 Clindamycin 8/25 >>  Gentamicin 8/25 >>   Thank you for allowing pharmacy to be a part of this patient's care.  Don Broach, PharmD 03/16/2023 2:23 PM

## 2023-03-16 NOTE — Progress Notes (Signed)
Faculty Note  S: Patient sleeping at bedside, feels like her "ovaries are swollen".   O: BP 108/75   Pulse 99   Temp (!) 100.7 F (38.2 C) (Oral)   Resp 16   LMP 06/16/2022   SpO2 99%   Breastfeeding Unknown  CBC    Component Value Date/Time   WBC 9.7 03/15/2023 0136   RBC 3.60 (L) 03/15/2023 0136   HGB 11.4 (L) 03/15/2023 0136   HGB 10.2 (L) 01/06/2023 0843   HGB 12.0 08/28/2015 0944   HCT 34.5 (L) 03/15/2023 0136   HCT 31.0 (L) 01/06/2023 0843   HCT 36.8 08/28/2015 0944   PLT PENDING 03/16/2023 0827   PLT 141 (L) 01/06/2023 0843   MCV 95.8 03/15/2023 0136   MCV 97 01/06/2023 0843   MCV 94.8 08/28/2015 0944   MCH 31.7 03/15/2023 0136   MCHC 33.0 03/15/2023 0136   RDW 13.2 03/15/2023 0136   RDW 12.6 01/06/2023 0843   RDW 12.6 08/28/2015 0944   LYMPHSABS 2.9 11/21/2022 1653   LYMPHSABS 2.8 09/27/2022 1115   LYMPHSABS 3.0 08/28/2015 0944   MONOABS 0.7 11/21/2022 1653   MONOABS 0.3 08/28/2015 0944   EOSABS 0.2 11/21/2022 1653   EOSABS 0.1 09/27/2022 1115   BASOSABS 0.0 11/21/2022 1653   BASOSABS 0.0 09/27/2022 1115   BASOSABS 0.0 08/28/2015 0944     Gen: alert, oriented SVE: Jada in place to wall suction  A/P: Pt is 32 y.o. [id number] @ [redacted]w[redacted]d who is s/p vaginal delivery complicated by retained placenta requiring manual removal with bedside D&C under ultrasound guidance with uterine atony. EBL 1700.  mL. Jada in place x 1.5 hrs, plan to cut wall suction at 2 hrs. Currently getting 1 unit pRBC.  DIC panel pending, CBC clotted and will redraw Start methergine series Give txa now S/p cytotec and continues on pitocin CBC/DIC once blood finishes infusing Nexplanon   K. Therese Sarah, MD, [facility] Attending Center for [facility] Healthcare [facility])

## 2023-03-16 NOTE — Anesthesia Postprocedure Evaluation (Signed)
Anesthesia Post Note  Patient: Shelly Koch  Procedure(s) Performed: AN AD HOC LABOR EPIDURAL     Patient location during evaluation: Mother Baby Anesthesia Type: Epidural Level of consciousness: awake Pain management: satisfactory to patient Vital Signs Assessment: post-procedure vital signs reviewed and stable Respiratory status: spontaneous breathing Cardiovascular status: stable Anesthetic complications: no  No notable events documented.  Last Vitals:  Vitals:   03/16/23 1500 03/16/23 1548  BP:  (!) 98/49  Pulse:  90  Resp:  16  Temp: 36.9 C 37.3 C  SpO2:  95%    Last Pain:  Vitals:   03/16/23 1548  TempSrc: Oral  PainSc:    Pain Goal:                   [employer]

## 2023-03-17 ENCOUNTER — Ambulatory Visit: Payer: Medicaid Other

## 2023-03-17 LAB — CBC
HCT: 23.8 % — ABNORMAL LOW (ref 36.0–46.0)
Hemoglobin: 7.9 g/dL — ABNORMAL LOW (ref 12.0–15.0)
MCH: 31.5 pg (ref 26.0–34.0)
MCHC: 33.2 g/dL (ref 30.0–36.0)
MCV: 94.8 fL (ref 80.0–100.0)
Platelets: 97 10*3/uL — ABNORMAL LOW (ref 150–400)
RBC: 2.51 MIL/uL — ABNORMAL LOW (ref 3.87–5.11)
RDW: 14 % (ref 11.5–15.5)
WBC: 12.2 10*3/uL — ABNORMAL HIGH (ref 4.0–10.5)
nRBC: 0 % (ref 0.0–0.2)

## 2023-03-17 LAB — PREPARE RBC (CROSSMATCH)

## 2023-03-17 LAB — GLUCOSE, CAPILLARY: Glucose-Capillary: 149 mg/dL — ABNORMAL HIGH (ref 70–99)

## 2023-03-17 MED ORDER — SODIUM CHLORIDE 0.9% IV SOLUTION: Freq: Once | INTRAVENOUS | Status: AC

## 2023-03-17 NOTE — Progress Notes (Signed)
POSTPARTUM PROGRESS NOTE  PPD #1  Subjective:  Shelly Koch is a 32 y.o. U9W1191 s/p NSVD complicated by manual extraction of placenta with PPH requiring bedside curettage (under [place]) and JADA.  Today she notes that she is starting to feel better, but notes considerable dizziness with standing and ambulation.  Foley still in place due to limited ambulation. Tolerating po.  Denies nausea or vomiting. She has passed flatus, no BM.  Pain is manageable.  Lochia appropriate Denies fever/chills/chest pain/SOB.    Objective: Blood pressure (!) 89/61, pulse 79, temperature 98.1 F (36.7 C), temperature source Oral, resp. rate 16, last menstrual period 06/16/2022, SpO2 100%, unknown if currently breastfeeding. UOP: 675cc/8hr  Physical Exam:  General: alert, cooperative and no distress Chest: no respiratory distress Heart: regular rate and rhythm Abdomen: soft Uterine Fundus: firm, appropriately tender, below umbilicus DVT Evaluation: No calf swelling or tenderness Extremities: no edema Skin: warm, dry  Results for orders placed or performed during the hospital encounter of 03/15/23 (from the past 24 hour(s))  Prepare RBC (crossmatch)     Status: None   Collection Time: 03/16/23  8:10 AM  Result Value Ref Range   Order Confirmation      ORDER PROCESSED BY BLOOD BANK Performed at [facility], [address] [place], [place] [address]   DIC Panel ONCE - STAT     Status: None   Collection Time: 03/16/23  8:27 AM  Result Value Ref Range   Prothrombin Time SPECIMEN CLOTTED 11.4 - 15.2 seconds   INR SPECIMEN CLOTTED 0.8 - 1.2   aPTT SPECIMEN CLOTTED 24 - 36 seconds   Fibrinogen SPECIMEN CLOTTED 210 - 475 mg/dL   D-Dimer, Quant SPECIMEN CLOTTED 0.00 - 0.50 ug/mL-FEU   Platelets SPECIMEN CLOTTED 150 - 400 K/uL   Smear Review SPECIMEN CLOTTED   CBC     Status: Abnormal   Collection Time: 03/16/23 12:09 PM  Result Value Ref Range   WBC 18.4 (H) 4.0 - 10.5 K/uL   RBC 3.15 (L) 3.87 -  5.11 MIL/uL   Hemoglobin 9.9 (L) 12.0 - 15.0 g/dL   HCT 56.2 (L) 13.0 - 86.5 %   MCV 94.0 80.0 - 100.0 fL   MCH 31.4 26.0 - 34.0 pg   MCHC 33.4 30.0 - 36.0 g/dL   RDW 78.4 69.6 - 29.5 %   Platelets 114 (L) 150 - 400 K/uL   nRBC 0.0 0.0 - 0.2 %  DIC Panel ONCE - STAT     Status: Abnormal   Collection Time: 03/16/23 12:09 PM  Result Value Ref Range   Prothrombin Time 14.8 11.4 - 15.2 seconds   INR 1.1 0.8 - 1.2   aPTT 24 24 - 36 seconds   Fibrinogen 322 210 - 475 mg/dL   D-Dimer, Quant 2.84 (H) 0.00 - 0.50 ug/mL-FEU   Platelets 126 (L) 150 - 400 K/uL   Smear Review NO SCHISTOCYTES SEEN   CBC     Status: Abnormal   Collection Time: 03/16/23  8:18 PM  Result Value Ref Range   WBC 15.1 (H) 4.0 - 10.5 K/uL   RBC 2.93 (L) 3.87 - 5.11 MIL/uL   Hemoglobin 9.3 (L) 12.0 - 15.0 g/dL   HCT 13.2 (L) 44.0 - 10.2 %   MCV 93.9 80.0 - 100.0 fL   MCH 31.7 26.0 - 34.0 pg   MCHC 33.8 30.0 - 36.0 g/dL   RDW 72.5 36.6 - 44.0 %   Platelets 101 (L) 150 - 400  K/uL   nRBC 0.0 0.0 - 0.2 %  CBC     Status: Abnormal   Collection Time: 03/17/23  4:22 AM  Result Value Ref Range   WBC 12.2 (H) 4.0 - 10.5 K/uL   RBC 2.51 (L) 3.87 - 5.11 MIL/uL   Hemoglobin 7.9 (L) 12.0 - 15.0 g/dL   HCT 21.3 (L) 08.6 - 57.8 %   MCV 94.8 80.0 - 100.0 fL   MCH 31.5 26.0 - 34.0 pg   MCHC 33.2 30.0 - 36.0 g/dL   RDW 46.9 62.9 - 52.8 %   Platelets 97 (L) 150 - 400 K/uL   nRBC 0.0 0.0 - 0.2 %    Assessment/Plan: Shelly Koch is a 33 y.o. U1L2440 s/p SVD with complicated by manual extraction of placenta with PPH requiring bedside curettage (under [place]) and JADA  at [redacted]w[redacted]d PPD#1: 1) Heme -s/p 1u pRBC and still with symptomatic anemia.  Plan for transfusion of an additional unit -Lochia appropriate -will continue to closely monitor  2) Postpartum care -plan to discontinue foley today -encourage ambulation as tolerated -pain well controlled -s/p gent/Clinda x 24hr, currently afebrile  Contraception: desires inpatient  Nexplanon Feeding: bottle  Dispo: Continue postpartum care as outlined above   LOS: 2 days   Myna Hidalgo, DO Faculty Attending, Center for [facility] Healthcare 03/17/2023, 7:07 AM

## 2023-03-17 NOTE — Progress Notes (Signed)
Late entry for around 0745  Uterus appeared to be intermittently boggy with occasional episode of increased vaginal bleeding.  JADA was placed without difficulty.  Minimal blood noted in tubing.  Balloon filled with 120cc.  Foley cath also placed for strict I/Os.  Will plan to continue to closely monitor bleeding.  Due to total EBL now >1500cc, CBC ordered, pt consented for 1 u pRBC and will continue to closely monitor need for additional blood products.  IV fluids running @ 125cc/hr.    Myna Hidalgo, DO Attending Obstetrician & Gynecologist, [facility] for [employer], [facility]

## 2023-03-18 ENCOUNTER — Inpatient Hospital Stay (HOSPITAL_COMMUNITY): Payer: Medicaid Other

## 2023-03-18 LAB — BPAM RBC
Blood Product Expiration Date: 202409022359
Blood Product Expiration Date: 202409162359
ISSUE DATE / TIME: 202408250843
ISSUE DATE / TIME: 202408260827
Unit Type and Rh: 6200
Unit Type and Rh: 8400

## 2023-03-18 LAB — CBC
HCT: 27.8 % — ABNORMAL LOW (ref 36.0–46.0)
Hemoglobin: 9.2 g/dL — ABNORMAL LOW (ref 12.0–15.0)
MCH: 31.7 pg (ref 26.0–34.0)
MCHC: 33.1 g/dL (ref 30.0–36.0)
MCV: 95.9 fL (ref 80.0–100.0)
Platelets: 116 10*3/uL — ABNORMAL LOW (ref 150–400)
RBC: 2.9 MIL/uL — ABNORMAL LOW (ref 3.87–5.11)
RDW: 15.3 % (ref 11.5–15.5)
WBC: 8.9 10*3/uL (ref 4.0–10.5)
nRBC: 0 % (ref 0.0–0.2)

## 2023-03-18 LAB — TYPE AND SCREEN
ABO/RH(D): AB POS
Antibody Screen: NEGATIVE
Unit division: 0
Unit division: 0

## 2023-03-18 MED ORDER — AMOXICILLIN-POT CLAVULANATE 875-125 MG PO TABS
1.0000 | ORAL_TABLET | Freq: Two times a day (BID) | ORAL | Status: DC
  Administered 2023-03-18 – 2023-03-19 (×3): 1 via ORAL
  Filled 2023-03-18 (×3): qty 1

## 2023-03-18 MED ORDER — METFORMIN HCL 500 MG PO TABS
500.0000 mg | ORAL_TABLET | Freq: Two times a day (BID) | ORAL | Status: DC
  Administered 2023-03-18 – 2023-03-19 (×2): 500 mg via ORAL
  Filled 2023-03-18 (×2): qty 1

## 2023-03-18 MED ORDER — FAMOTIDINE 20 MG PO TABS
20.0000 mg | ORAL_TABLET | Freq: Every evening | ORAL | Status: DC | PRN
  Administered 2023-03-18: 20 mg via ORAL
  Filled 2023-03-18: qty 1

## 2023-03-18 NOTE — Progress Notes (Signed)
Orthostatic VS obtained: Lying: 119/66, HR 72 Sitting: 125/78, HR 69 Standing: 118/66, HR 72

## 2023-03-18 NOTE — Progress Notes (Addendum)
Daily Postpartum Note  Admission Date: 03/15/2023 Current Date: 03/18/2023 8:34 AM  Shelly Koch is a 32 y.o. G3P3003 PPD#2 s/p SVD1st degree (repaired) at [redacted]w[redacted]d. Delivery c/b adherent placenta, need for manual extraction and bedside uterine curettage done with bedside u/s.   Pregnancy complicated by: Patient Active Problem List   Diagnosis Date Noted   Postpartum hemorrhage 03/16/2023   Retained placenta 03/16/2023   Gestational diabetes mellitus (GDM) affecting pregnancy 03/15/2023   Gestational diabetes 02/13/2023   Thrombocytopenia affecting pregnancy (HCC) 01/25/2023   Anemia during pregnancy in second trimester 11/21/2022   History of gestational diabetes mellitus (GDM) in prior pregnancy, currently pregnant 11/04/2022   History of pyelonephritis 10/01/2022   Supervision of high risk pregnancy, antepartum 09/19/2022   History of gestational hypertension 03/05/2017   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 She is ambulating to the bathroom but states that she needs help to ambulate and doesn't feel comfortable going by herself.  VB/lochia seems normal. No f/c/n/v  Objective:    Current Vital Signs 24h Vital Sign Ranges  T 98.2 F (36.8 C) Temp  Avg: 98 F (36.7 C)  Min: 97.8 F (36.6 C)  Max: 98.2 F (36.8 C)  BP (!) 102/54 BP  Min: 96/73  Max: 113/78  HR 85 Pulse  Avg: 84.2  Min: 80  Max: 91  RR 16 Resp  Avg: 17.2  Min: 16  Max: 18  SaO2 100 % Room Air SpO2  Avg: 99.2 %  Min: 97 %  Max: 100 %       24 Hour I/O Current Shift I/O  Time Ins Outs 08/26 0701 - 08/27 0700 In: 575 [P.O.:240] Out: 4275 [Urine:4275] No intake/output data recorded.   Physical exam: General: Well nourished, well  developed female laying in bed and looks anxious.  Abdomen: non distended, mildly ttp in the suprapubic area GU: pads in bathroom with normal lochia appearance and amount Back: no CVAT Respiratory: no respiratory distress Extremities: no clubbing, cyanosis or edema Skin: Warm and dry.   Medications: Current Facility-Administered Medications  Medication Dose Route Frequency Provider Last Rate Last Admin   0.9 %  sodium chloride infusion (Manually program via Guardrails IV Fluids)   Intravenous Once Myna Hidalgo, DO       0.9 %  sodium chloride infusion (Manually program via Guardrails IV Fluids)   Intravenous Once Conan Bowens, MD       acetaminophen (TYLENOL) tablet 650 mg  650 mg Oral Q4H PRN Hurshel Party, CNM   650 mg at 03/18/23 0309   benzocaine-Menthol (DERMOPLAST) 20-0.5 % topical spray 1 Application  1 Application Topical PRN Leftwich-Kirby, Wilmer Floor, CNM   1 Application at 03/16/23 1421   coconut oil  1 Application Topical PRN Leftwich-Kirby, Wilmer Floor, CNM       witch hazel-glycerin (TUCKS) pad 1 Application  1 Application Topical PRN Leftwich-Kirby, Wilmer Floor, CNM       And   dibucaine (NUPERCAINAL) 1 % rectal ointment 1 Application  1 Application Rectal PRN Leftwich-Kirby, Lisa A, CNM       diphenhydrAMINE (BENADRYL) capsule 25 mg  25 mg Oral Q6H PRN Leftwich-Kirby, Lisa A, CNM       ferrous sulfate tablet 325  mg  325 mg Oral Ellwood Handler, DO   325 mg at 03/16/23 2242   ibuprofen (ADVIL) tablet 600 mg  600 mg Oral Q6H Leftwich-Kirby, Lisa A, CNM   600 mg at 03/18/23 0545   lactated ringers infusion   Intravenous Continuous Conan Bowens, MD 125 mL/hr at 03/17/23 0433 New Bag at 03/17/23 0433   methylergonovine (METHERGINE) tablet 0.2 mg  0.2 mg Oral Q4H PRN Conan Bowens, MD       Or   methylergonovine (METHERGINE) injection 0.2 mg  0.2 mg Intramuscular Q4H PRN Conan Bowens, MD   0.2 mg at 03/16/23 9629   oxyCODONE (Oxy IR/ROXICODONE) immediate release tablet 10  mg  10 mg Oral Q4H PRN Conan Bowens, MD   10 mg at 03/17/23 1811   oxyCODONE (Oxy IR/ROXICODONE) immediate release tablet 5 mg  5 mg Oral Q4H PRN Conan Bowens, MD   5 mg at 03/17/23 0131   prenatal multivitamin tablet 1 tablet  1 tablet Oral Q1200 Leftwich-Kirby, Wilmer Floor, CNM   1 tablet at 03/17/23 1052   senna-docusate (Senokot-S) tablet 2 tablet  2 tablet Oral Daily Leftwich-Kirby, Wilmer Floor, CNM   2 tablet at 03/17/23 1053   simethicone (MYLICON) chewable tablet 80 mg  80 mg Oral PRN Leftwich-Kirby, Lisa A, CNM       tranexamic acid (CYKLOKAPRON) IVPB 1,000 mg  1,000 mg Intravenous Once PRN Lazaro Arms, MD       zolpidem (AMBIEN) tablet 5 mg  5 mg Oral QHS PRN Hurshel Party, CNM        Labs:  Recent Labs  Lab 03/16/23 2018 03/17/23 0422 03/18/23 0416  WBC 15.1* 12.2* 8.9  HGB 9.3* 7.9* 9.2*  HCT 27.5* 23.8* 27.8*  PLT 101* 97* 116*   CBG 1823 149  Radiology:  No new imaging  Assessment & Plan:  PP pain; pt stable *PP: having more pain that to be expected; she took 5mg  oxycodone last at 0115. I told her that PP u/s can be hard to interpret but feel it worthwhile in her situation. No e/o infection and her H/H are to where she shouldn't feel symptomatic; RN also asked to get orthostatics. Pt told to stay NPO *h/o GTD: surg path pending.  *GDM: metformin bid with food added *PPx: SCDs *FEN/GI: NPO. Add MIVF PRN  Cornelia Copa MD Attending Center for [facility]) GYN Consult Phone: [phone] (M-F, 0800-1700) & [phone]  (Off hours, weekends, holidays)

## 2023-03-18 NOTE — Progress Notes (Signed)
Ultrasound done but not read as Radiology has some ongoing issues with their programs (currently in downtime).  On my review of images, appropriately diffusely thickened postpartum endometrial stripe of 26 mm, no significant flow noted, normal adnexa bilaterally, no etiology for her pain. No heavy bleeding or concern for retained products.   Concerned about possible lingering endometritis after her Triple I, but reassured by lack of fevers, WBC today is 8.6.  Hemoglobin 9.2.  Not orthostatic.  Given continued pain and concern about residual uterine infection, will treat with Augmentin for now and monitor symptoms. Continue Ibuprofen as scheduled for pain, and Oxycodone for breakthrough pain.   Will follow up formal ultrasound results later and manage accordingly.     Jaynie Collins, MD, FACOG Obstetrician & Gynecologist, [facility] for [employer], [facility]

## 2023-03-19 ENCOUNTER — Other Ambulatory Visit (HOSPITAL_COMMUNITY): Payer: Self-pay

## 2023-03-19 DIAGNOSIS — Z30017 Encounter for initial prescription of implantable subdermal contraceptive: Secondary | ICD-10-CM | POA: Diagnosis not present

## 2023-03-19 LAB — SURGICAL PATHOLOGY

## 2023-03-19 MED ORDER — AMOXICILLIN-POT CLAVULANATE 875-125 MG PO TABS
1.0000 | ORAL_TABLET | Freq: Two times a day (BID) | ORAL | 0 refills | Status: AC
  Filled 2023-03-19: qty 20, 10d supply, fill #0

## 2023-03-19 MED ORDER — IBUPROFEN 600 MG PO TABS
600.0000 mg | ORAL_TABLET | Freq: Four times a day (QID) | ORAL | 0 refills | Status: AC
  Filled 2023-03-19: qty 30, 8d supply, fill #0

## 2023-03-19 MED ORDER — ETONOGESTREL 68 MG ~~LOC~~ IMPL
68.0000 mg | DRUG_IMPLANT | Freq: Once | SUBCUTANEOUS | Status: AC
  Administered 2023-03-19: 68 mg via SUBCUTANEOUS
  Filled 2023-03-19: qty 1

## 2023-03-19 MED ORDER — LIDOCAINE HCL 1 % IJ SOLN
0.0000 mL | Freq: Once | INTRAMUSCULAR | Status: AC | PRN
  Administered 2023-03-19: 20 mL via INTRADERMAL
  Filled 2023-03-19: qty 20

## 2023-03-19 MED ORDER — OXYCODONE HCL 5 MG PO TABS
5.0000 mg | ORAL_TABLET | ORAL | 0 refills | Status: DC | PRN
  Filled 2023-03-19: qty 12, 2d supply, fill #0

## 2023-03-19 NOTE — Progress Notes (Signed)
Patient given discharge instructions and denies any questions or concerns. Patient ambulatory off unit at this time. 

## 2023-03-23 ENCOUNTER — Encounter (HOSPITAL_COMMUNITY): Payer: Self-pay | Admitting: Obstetrics and Gynecology

## 2023-03-23 ENCOUNTER — Encounter: Payer: Self-pay | Admitting: Obstetrics and Gynecology

## 2023-03-23 ENCOUNTER — Inpatient Hospital Stay (HOSPITAL_COMMUNITY)
Admission: AD | Admit: 2023-03-23 | Discharge: 2023-03-24 | Disposition: A | Discharge status: Home / Self Care | Payer: Medicaid Other | Attending: Obstetrics and Gynecology | Admitting: Obstetrics and Gynecology

## 2023-03-23 DIAGNOSIS — O1415 Severe pre-eclampsia, complicating the puerperium: Secondary | ICD-10-CM | POA: Diagnosis not present

## 2023-03-23 DIAGNOSIS — O9089 Other complications of the puerperium, not elsewhere classified: Secondary | ICD-10-CM | POA: Insufficient documentation

## 2023-03-23 DIAGNOSIS — O165 Unspecified maternal hypertension, complicating the puerperium: Secondary | ICD-10-CM

## 2023-03-23 DIAGNOSIS — R519 Headache, unspecified: Secondary | ICD-10-CM | POA: Diagnosis not present

## 2023-03-23 DIAGNOSIS — Z3A42 42 weeks gestation of pregnancy: Secondary | ICD-10-CM

## 2023-03-23 DIAGNOSIS — H538 Other visual disturbances: Secondary | ICD-10-CM | POA: Diagnosis not present

## 2023-03-23 HISTORY — DX: Gestational (pregnancy-induced) hypertension without significant proteinuria, unspecified trimester: O13.9

## 2023-03-23 LAB — COMPREHENSIVE METABOLIC PANEL
ALT: 23 U/L (ref 0–44)
AST: 19 U/L (ref 15–41)
Albumin: 2.6 g/dL — ABNORMAL LOW (ref 3.5–5.0)
Alkaline Phosphatase: 108 U/L (ref 38–126)
Anion gap: 10 (ref 5–15)
BUN: 5 mg/dL — ABNORMAL LOW (ref 6–20)
CO2: 24 mmol/L (ref 22–32)
Calcium: 8.7 mg/dL — ABNORMAL LOW (ref 8.9–10.3)
Chloride: 103 mmol/L (ref 98–111)
Creatinine, Ser: 0.65 mg/dL (ref 0.44–1.00)
GFR, Estimated: >60 mL/min (ref >60)
Glucose, Bld: 96 mg/dL (ref 70–99)
Potassium: 4 mmol/L (ref 3.5–5.1)
Sodium: 137 mmol/L (ref 135–145)
Total Bilirubin: 0.3 mg/dL (ref 0.3–1.2)
Total Protein: 5.9 g/dL — ABNORMAL LOW (ref 6.5–8.1)

## 2023-03-23 LAB — PROTEIN / CREATININE RATIO, URINE
Creatinine, Urine: 21 mg/dL
Total Protein, Urine: <6 mg/dL

## 2023-03-23 LAB — CBC
HCT: 33.2 % — ABNORMAL LOW (ref 36.0–46.0)
Hemoglobin: 10.9 g/dL — ABNORMAL LOW (ref 12.0–15.0)
MCH: 31.1 pg (ref 26.0–34.0)
MCHC: 32.8 g/dL (ref 30.0–36.0)
MCV: 94.6 fL (ref 80.0–100.0)
Platelets: 258 10*3/uL (ref 150–400)
RBC: 3.51 MIL/uL — ABNORMAL LOW (ref 3.87–5.11)
RDW: 13.7 % (ref 11.5–15.5)
WBC: 7.5 10*3/uL (ref 4.0–10.5)
nRBC: 0 % (ref 0.0–0.2)

## 2023-03-23 MED ORDER — NIFEDIPINE ER OSMOTIC RELEASE 30 MG PO TB24
30.0000 mg | ORAL_TABLET | Freq: Every day | ORAL | Status: DC
  Administered 2023-03-23: 30 mg via ORAL
  Filled 2023-03-23: qty 1

## 2023-03-23 MED ORDER — FUROSEMIDE 20 MG PO TABS
20.0000 mg | ORAL_TABLET | Freq: Every day | ORAL | 0 refills | Status: AC
Start: 2023-03-23 — End: 2023-03-28

## 2023-03-23 MED ORDER — LABETALOL HCL 5 MG/ML IV SOLN: 40.0000 mg | INTRAVENOUS | Status: DC | PRN

## 2023-03-23 MED ORDER — NIFEDIPINE 10 MG PO CAPS: 20.0000 mg | ORAL_CAPSULE | ORAL | Status: DC | PRN

## 2023-03-23 MED ORDER — NIFEDIPINE ER OSMOTIC RELEASE 30 MG PO TB24
30.0000 mg | ORAL_TABLET | Freq: Once | ORAL | Status: AC
  Administered 2023-03-24: 30 mg via ORAL
  Filled 2023-03-23: qty 1

## 2023-03-23 MED ORDER — DIPHENHYDRAMINE HCL 25 MG PO CAPS
25.0000 mg | ORAL_CAPSULE | Freq: Once | ORAL | Status: DC
  Filled 2023-03-23: qty 1

## 2023-03-23 MED ORDER — ACETAMINOPHEN 500 MG PO TABS
1000.0000 mg | ORAL_TABLET | Freq: Four times a day (QID) | ORAL | Status: DC | PRN
  Administered 2023-03-23: 1000 mg via ORAL
  Filled 2023-03-23: qty 2

## 2023-03-23 MED ORDER — NIFEDIPINE ER 30 MG PO TB24: 60.0000 mg | ORAL_TABLET | Freq: Every day | ORAL | 1 refills | Status: DC

## 2023-03-23 MED ORDER — METOCLOPRAMIDE HCL 10 MG PO TABS
10.0000 mg | ORAL_TABLET | Freq: Once | ORAL | Status: DC
  Filled 2023-03-23: qty 1

## 2023-03-23 MED ORDER — IBUPROFEN 600 MG PO TABS
600.0000 mg | ORAL_TABLET | Freq: Four times a day (QID) | ORAL | Status: DC | PRN
  Administered 2023-03-23: 600 mg via ORAL
  Filled 2023-03-23: qty 1

## 2023-03-23 MED ORDER — NIFEDIPINE 10 MG PO CAPS: 10.0000 mg | ORAL_CAPSULE | ORAL | Status: DC | PRN

## 2023-03-23 NOTE — MAU Note (Signed)
.  Shelly Koch is a 32 y.o. at [facility] from Sunday 8/25 here in MAU reporting:  Pt states yesterday she started feeling lightheaded and slept all day.  Today she took her blood pressure at home and it was 166/96. Pt states she feels dizzy and has blurry vision. Pt states she talked to someone on mychart and they said for her to come to MAU.   6/10 pain headache back of head. Pt took tylenol this morning but the headache came back.       Vitals:   03/23/23 1932  BP: 136/88  Pulse: 75  Resp: 16  Temp: 98.4 F (36.9 C)  SpO2: 98%

## 2023-03-23 NOTE — MAU Provider Note (Signed)
Chief Complaint:  No chief complaint on file.  HPI  HPI: Shelly Koch is a 32 y.o. G3P3003 now 1 week post partum that presents to maternity admissions reporting feeling lightheaded, dizzy, with blurry vision, posterior headaches and elevated home BP readings. Max home readings reported: SBP 190, DBP 108.   Blurry vision started yesterday, waxes and wanes.  Occipital headache 6/10 since this morning.  Lightheadedness and fatigues all day  She reports urinary symptoms, n/v, diarrhea, constipation or fever/chills.  She denies RUQ abdominal pain.  Lochia rubra light Mood good Baby doing well  Past Medical History: Past Medical History:  Diagnosis Date   Anemia    Asherman syndrome    Asthma    as child   Chronic bilateral low back pain without sciatica 11/23/2018   Gestational diabetes    first preg   Gestational trophoblastic neoplasm    Headache    Irregular heart beat    has a heart monitor   Leukopenia due to antineoplastic chemotherapy (HCC)    PICC (peripherally inserted central catheter) in place 12/24/2014   Pregnancy induced hypertension    Pyelonephritis    UTI (urinary tract infection)     Past obstetric history: OB History  Gravida Para Term Preterm AB Living  3 3 3     3   SAB IAB Ectopic Multiple Live Births        0 3    # Outcome Date GA Lbr Len/2nd Weight Sex Type Anes PTL Lv  3 Term 03/16/23 [redacted]w[redacted]d / 00:28 2890 g M Vag-Spont EPI  LIV  2 Term 09/17/17 [redacted]w[redacted]d 09:30 / 00:20 2900 g F Vag-Spont None  LIV  1 Term 06/05/14 [redacted]w[redacted]d 02:58 / 00:45 2110 g F Vag-Spont Local  LIV     Birth Comments: NONE    Obstetric Comments  2015- started as twin preg, GDM    Past Surgical History: Past Surgical History:  Procedure Laterality Date   DILATION AND EVACUATION N/A 08/10/2014   Procedure: DILATATION AND EVACUATION;  Surgeon: Willodean Rosenthal, MD;  Location: [facility];  Service: Gynecology;  Laterality: N/A;   DILATION AND EVACUATION N/A 10/08/2017   Procedure:  DILATATION AND EVACUATION;  Surgeon: Catalina Antigua, MD;  Location: [facility];  Service: Gynecology;  Laterality: N/A;   HYSTEROSCOPY N/A 08/10/2014   Procedure: HYSTEROSCOPY;  Surgeon: Willodean Rosenthal, MD;  Location: [facility];  Service: Gynecology;  Laterality: N/A;    Family History: Family History  Problem Relation Age of Onset   Healthy Mother    Healthy Father    Alcohol abuse Neg Hx    Asthma Neg Hx    Cancer Neg Hx    Diabetes Neg Hx    Heart disease Neg Hx    Hypertension Neg Hx     Social History: Social History   Tobacco Use   Smoking status: Never   Smokeless tobacco: Never  Vaping Use   Vaping status: Never Used  Substance Use Topics   Alcohol use: Never   Drug use: Never    Allergies:  Allergies  Allergen Reactions   Okra Itching    Hives and itching   Ondansetron Hives and Itching   Phenergan [Promethazine] Hives and Itching    Note: pt tolerates PO COMPAZINE   Zofran [Ondansetron Hcl] Rash    Meds:  Medications Prior to Admission  Medication Sig Dispense Refill Last Dose   acetaminophen (TYLENOL) 500 MG tablet Take 500 mg by mouth every 6 (six) hours as needed.  03/23/2023 at 1200   amoxicillin-clavulanate (AUGMENTIN) 875-125 MG tablet Take 1 tablet by mouth every 12 (twelve) hours for 10 days. 20 tablet 0 03/23/2023   oxyCODONE (OXY IR/ROXICODONE) 5 MG immediate release tablet Take 1 tablet (5 mg total) by mouth every 4 (four) hours as needed for moderate pain. 12 tablet 0 03/20/2023   Prenatal Vit w/Fe-Methylfol-FA (PNV-SELECT) 27-0.6-0.4 MG TABS Take 1 tablet by mouth daily. 30 tablet 12 03/23/2023   docusate sodium (COLACE) 50 MG capsule Take 50 mg by mouth daily as needed for mild constipation.      famotidine (PEPCID) 20 MG tablet TAKE 1 TABLET(20 MG) BY MOUTH AT BEDTIME 90 tablet 2    ibuprofen (ADVIL) 600 MG tablet Take 1 tablet (600 mg total) by mouth every 6 (six) hours. 30 tablet 0     I have reviewed patient's Past Medical Hx, Surgical Hx,  Family Hx, Social Hx, medications and allergies.   ROS:  Review of Systems Other systems negative  Physical Exam  Patient Vitals for the past 24 hrs:  BP Temp Temp src Pulse Resp SpO2 Height Weight  03/23/23 2100 (!) 152/84 -- -- (!) 58 -- 99 % -- --  03/23/23 2045 (!) 171/87 -- -- (!) 55 -- -- -- --  03/23/23 2030 (!) 159/81 -- -- (!) 57 -- -- -- --  03/23/23 2015 139/89 -- -- (!) 59 -- -- -- --  03/23/23 2001 (!) 148/87 -- -- (!) 57 -- -- -- --  03/23/23 1947 125/85 -- -- 66 17 97 % -- --  03/23/23 1932 136/88 98.4 F (36.9 C) Oral 75 16 98 % 4\' 10"  (1.473 m) 51.6 kg   Constitutional: Well-developed, well-nourished female in no acute distress.  Cardiovascular: normal rate and rhythm Respiratory: normal effort, clear to auscultation bilaterally GI: Abd soft, non-tender, gravid appropriate for gestational age.   No rebound or guarding. MS: Extremities nontender, no edema, normal ROM Neurologic: Alert and oriented x 4. DTR +2. 5/5 strength UE & LE. CN II-XI intact.  GU: Neg CVAT.   Labs: No results found for this or any previous visit (from the past 24 hour(s)). --/--/AB POS (08/24 0134)  Imaging:    MAU Course/MDM: I have reviewed the triage vital signs and the nursing notes.   Pertinent labs & imaging results that were available during my care of the patient were reviewed by me and considered in my medical decision making (see chart for details).      I have reviewed her medical records including past results, notes and treatments.   I have ordered labs and reviewed results.  NST reviewed Discussed with presentation, exam findings and test results with Dr. Roma Kayser. Treatments in MAU included Tylenol, CBC, CMP, UA, Urine pro/cr, serial BP monitoring.    Assessment: 1. Postpartum hypertension   2. Acute nonintractable headache, unspecified headache type   3. Blurry vision, bilateral   Suspect postpartum preeclampsia with severe features of blurry vision and headaches.    Start Procardia XL 30mg  now  Plan: Will monitor blood pressures in MAU while labs are pending. If abnormal labs will start magnesium, procardia and admit for 24 hours of monitoring and therapy.   If normal labs could discharge home with Procardia and close follow up, patient established with [facility] already.   Pt stable at time of discharge.  Wyn Forster, MD FMOB Fellow, Faculty practice [facility], Center for [facility]  03/23/2023 9:00 PM

## 2023-03-24 NOTE — MAU Note (Signed)
Patient and FOB given instructions for new prescriptions of Procardia XL and Lasix to begin for Bps, checking blood pressure 2x a day. Patient counseled by MD about significance of preeclampsia and recommendation for admission with magnesium. Patient declined at this time and has been discharged AMA.  Raelyn Ensign

## 2023-03-25 ENCOUNTER — Ambulatory Visit: Payer: Medicaid Other

## 2023-03-26 ENCOUNTER — Other Ambulatory Visit: Payer: Self-pay

## 2023-03-26 ENCOUNTER — Ambulatory Visit: Payer: Medicaid Other | Admitting: *Deleted

## 2023-03-26 VITALS — BP 110/74 | HR 83 | Ht <= 58 in | Wt 106.3 lb

## 2023-03-26 DIAGNOSIS — O165 Unspecified maternal hypertension, complicating the puerperium: Secondary | ICD-10-CM

## 2023-03-26 LAB — COMPREHENSIVE METABOLIC PANEL
ALT: 19 IU/L (ref 0–32)
AST: 17 IU/L (ref 0–40)
Albumin: 4 g/dL (ref 3.9–4.9)
Alkaline Phosphatase: 124 IU/L — ABNORMAL HIGH (ref 44–121)
BUN/Creatinine Ratio: 11 (ref 9–23)
BUN: 6 mg/dL (ref 6–20)
Bilirubin Total: 0.4 mg/dL (ref 0.0–1.2)
CO2: 23 mmol/L (ref 20–29)
Calcium: 9.7 mg/dL (ref 8.7–10.2)
Chloride: 102 mmol/L (ref 96–106)
Creatinine, Ser: 0.53 mg/dL — ABNORMAL LOW (ref 0.57–1.00)
Globulin, Total: 3 g/dL (ref 1.5–4.5)
Glucose: 101 mg/dL — ABNORMAL HIGH (ref 70–99)
Potassium: 4.6 mmol/L (ref 3.5–5.2)
Sodium: 139 mmol/L (ref 134–144)
Total Protein: 7 g/dL (ref 6.0–8.5)
eGFR: 125 mL/min/{1.73_m2} (ref >59)

## 2023-03-26 LAB — CBC
Hematocrit: 38 % (ref 34.0–46.6)
Hemoglobin: 12.3 g/dL (ref 11.1–15.9)
MCH: 31.3 pg (ref 26.6–33.0)
MCHC: 32.4 g/dL (ref 31.5–35.7)
MCV: 97 fL (ref 79–97)
Platelets: 284 10*3/uL (ref 150–450)
RBC: 3.93 x10E6/uL (ref 3.77–5.28)
RDW: 12.9 % (ref 11.7–15.4)
WBC: 7.3 10*3/uL (ref 3.4–10.8)

## 2023-03-26 NOTE — Progress Notes (Unsigned)
Blood Pressure Check Visit  Shelly Koch is here for blood pressure check following spontaneous vaginal on 03/16/23. BP today is 114/78. Patient denies any dizzness, shortness of breath, and peripheral edema, but endorses intermitent headaches and blurry vision.   Patient dx'd with severe pre-E with delivery and PP HTN on 03/23/23 (HA, blurry vision).  Patient was Rx'd nifedipine 30mg  PO daily, but was told to not take it yesterday or today until BP check appointment today, per patient. PP visit scheduled for 04/23/23.   Reviewed with Merian Capron, MD. Per provider, patient does not need to take rx'd nifedipine, however we will re-evaluate BP in 1 week. Patient scheduled for BP check nurse visit on 04/03/23. Patient questioned if she is to take medication if BP does increase; informed patient to call office with BP reading before taking BP med first. Patient verbalized understanding and had no further questions or concerns.   Meryl Crutch, RN 03/27/23 at [phone]

## 2023-03-26 NOTE — Progress Notes (Signed)
Pt presents for BP check following vaginal delivery on 8/25 and dx of PP pre-eclampsia on 9/2. She reports lightheadedness and blurry vision every Shelly Koch. She also has daily H/A which is relieved temporarily with tylenol and Ibuprofen but states the H/A comes back. BP today - 110/74, P - 83.  Pt states she has not yet taken Nifedipine today. Of note, pt was prescribed Nifedipine 30 mg - take 2 tabs daily however has only been taking one tablet daily. She reports having normal twice daily BP readings @ home since MAU visit on 9/4. Per consult with Dorathy Kinsman, pt was advised not to take Nifedipine today and yo return tomorrow for BP check. Also, labs drawn for CBC and CM24. Pt voiced understanding and agreed to plan of care.

## 2023-03-27 ENCOUNTER — Ambulatory Visit (INDEPENDENT_AMBULATORY_CARE_PROVIDER_SITE_OTHER): Payer: Medicaid Other

## 2023-03-27 VITALS — BP 114/78 | HR 95 | Ht <= 58 in | Wt 109.2 lb

## 2023-03-27 DIAGNOSIS — Z0130 Encounter for examination of blood pressure without abnormal findings: Secondary | ICD-10-CM

## 2023-04-03 ENCOUNTER — Other Ambulatory Visit: Payer: Self-pay

## 2023-04-03 ENCOUNTER — Ambulatory Visit (INDEPENDENT_AMBULATORY_CARE_PROVIDER_SITE_OTHER): Payer: Medicaid Other | Admitting: *Deleted

## 2023-04-03 VITALS — BP 113/79 | HR 86 | Ht <= 58 in | Wt 110.0 lb

## 2023-04-03 DIAGNOSIS — Z0130 Encounter for examination of blood pressure without abnormal findings: Secondary | ICD-10-CM

## 2023-04-03 NOTE — Progress Notes (Signed)
Here for repeat BP check after being instructed to stop Nifedipine and BP check 03/27/23. She confirms she has not taken the Nifedipine. She denies headaches. She reports Bp at home about 121/81. BP today in office wnl at 113/79. Instructed to not take Nifedipine and to attend her postpartum exam 04/23/23. Also reviewed signs of pre-eclampsia. She voices understanding. Nancy Fetter

## 2023-04-15 ENCOUNTER — Other Ambulatory Visit: Payer: Self-pay | Admitting: Obstetrics and Gynecology

## 2023-04-23 ENCOUNTER — Ambulatory Visit: Payer: Medicaid Other | Admitting: Obstetrics and Gynecology

## 2023-04-23 ENCOUNTER — Encounter: Payer: Self-pay | Admitting: Obstetrics and Gynecology

## 2023-04-23 DIAGNOSIS — O019 Hydatidiform mole, unspecified: Secondary | ICD-10-CM

## 2023-04-23 DIAGNOSIS — R300 Dysuria: Secondary | ICD-10-CM | POA: Diagnosis not present

## 2023-04-23 LAB — POCT URINALYSIS DIP (DEVICE)
Bilirubin Urine: NEGATIVE
Glucose, UA: NEGATIVE mg/dL
Ketones, ur: NEGATIVE mg/dL
Leukocytes,Ua: NEGATIVE
Nitrite: NEGATIVE
Protein, ur: NEGATIVE mg/dL
Specific Gravity, Urine: 1.02 (ref 1.005–1.030)
Urobilinogen, UA: 0.2 mg/dL (ref 0.0–1.0)
pH: 6 (ref 5.0–8.0)

## 2023-04-23 NOTE — Progress Notes (Signed)
Post Partum Visit Note  Shelly Koch is a 32 y.o. G78P3003 female who presents for a postpartum visit. She is 5 weeks postpartum following a normal spontaneous vaginal delivery.  I have fully reviewed the prenatal and intrapartum course. The delivery was at 37/4 gestational weeks.  Anesthesia: epidural. Postpartum course has been uncomplicated. Baby is doing well. Baby is feeding by bottle - Similac Advance. Bleeding staining only. Bowel function is  constipation . Bladder function is abnormal: burning . Patient is not sexually active. Contraception method is Nexplanon. Postpartum depression screening: negative.   The pregnancy intention screening data noted above was reviewed. Potential methods of contraception were discussed. The patient elected to proceed with nexplanon.   Edinburgh Postnatal Depression Scale - 04/23/23 1116       Edinburgh Postnatal Depression Scale:  In the Past 7 Days   I have been able to laugh and see the funny side of things. 0    I have looked forward with enjoyment to things. 0    I have blamed myself unnecessarily when things went wrong. 0    I have been anxious or worried for no good reason. 0    I have felt scared or panicky for no good reason. 0    Things have been getting on top of me. 0    I have been so unhappy that I have had difficulty sleeping. 0    I have felt sad or miserable. 0    I have been so unhappy that I have been crying. 0    The thought of harming myself has occurred to me. 0    Edinburgh Postnatal Depression Scale Total 0             Health Maintenance Due  Topic Date Due   INFLUENZA VACCINE  02/20/2023   COVID-19 Vaccine (3 - 2023-24 season) 03/23/2023    The following portions of the patient's history were reviewed and updated as appropriate: allergies, current medications, past family history, past medical history, past social history, past surgical history, and problem list.  Review of Systems Pertinent items are noted  in HPI.  Objective:  BP 103/69   Pulse 82   Ht 4\' 10"  (1.473 m)   Wt 105 lb 11.2 oz (47.9 kg)   Breastfeeding No   BMI 22.09 kg/m    General:  alert, cooperative, and no distress   Breasts:  not indicated  Lungs: clear to auscultation bilaterally  Heart:  regular rate and rhythm  Abdomen: soft, non-tender; bowel sounds normal; no masses,  no organomegaly   Wound N/a  GU exam:   1st degree tear is well healed, no granulation tissue noted       Assessment:    Encounter for postpartum care  Normal postpartum postpartum exam.   Plan:   Essential components of care per ACOG recommendations:  1.  Mood and well being: Patient with negative depression screening today. Reviewed local resources for support.  - Patient tobacco use? No.   - hx of drug use? No.    2. Infant care and feeding:  -Patient currently breastmilk feeding? No.  -Social determinants of health (SDOH) reviewed in EPIC. No concerns.  3. Sexuality, contraception and birth spacing - Patient does not want a pregnancy in the next year.  Desired family size is 3 children.  - Reviewed reproductive life planning. Reviewed contraceptive methods based on pt preferences and effectiveness.  Patient desired Hormonal Implant today.   - Discussed birth  spacing of 18 months  4. Sleep and fatigue -Encouraged family/partner/community support of 4 hrs of uninterrupted sleep to help with mood and fatigue  5. Physical Recovery  - Discussed patients delivery and complications. She describes her labor as good. - Patient had a Vaginal problems after delivery including small fragments of retained placental after manual removal . Patient had a 1st degree laceration. Perineal healing reviewed. Patient expressed understanding - Patient has urinary incontinence? No. - Patient is safe to resume physical and sexual activity  6.  Health Maintenance - HM due items addressed Yes - Last pap smear  Diagnosis  Date Value Ref Range Status   04/17/2022   Final   - Negative for intraepithelial lesion or malignancy (NILM)   Pap smear not done at today's visit.  -Breast Cancer screening indicated? No.   7. Chronic Disease/Pregnancy Condition follow up: Gestational Diabetes  Pt has hx of GTN and desires bhcg quant follow up, will draw today and will follow until normal  2 hour GTT  for post delivery eval not done, will schedule as a lab only visit  - PCP follow up  Warden Fillers, MD Center for [employer], [facility]

## 2023-04-24 LAB — BETA HCG QUANT (REF LAB): hCG Quant: <1 m[IU]/mL

## 2023-04-26 LAB — URINE CULTURE

## 2023-04-28 ENCOUNTER — Other Ambulatory Visit: Payer: Self-pay

## 2023-04-28 ENCOUNTER — Other Ambulatory Visit: Payer: Medicaid Other

## 2023-04-28 DIAGNOSIS — O24415 Gestational diabetes mellitus in pregnancy, controlled by oral hypoglycemic drugs: Secondary | ICD-10-CM

## 2023-04-29 ENCOUNTER — Other Ambulatory Visit: Payer: Self-pay | Admitting: Obstetrics and Gynecology

## 2023-04-29 ENCOUNTER — Telehealth: Payer: Self-pay

## 2023-04-29 DIAGNOSIS — N3000 Acute cystitis without hematuria: Secondary | ICD-10-CM

## 2023-04-29 LAB — GLUCOSE TOLERANCE, 2 HOURS
Glucose, 2 hour: 155 mg/dL — ABNORMAL HIGH (ref 70–139)
Glucose, GTT - Fasting: 96 mg/dL (ref 70–99)

## 2023-04-29 MED ORDER — CEPHALEXIN 500 MG PO CAPS
500.0000 mg | ORAL_CAPSULE | Freq: Three times a day (TID) | ORAL | 0 refills | Status: AC
Start: 2023-04-29 — End: ?

## 2023-04-29 NOTE — Telephone Encounter (Signed)
-----   Message from Warden Fillers sent at 04/29/2023  6:59 AM EDT ----- Pt had e coli in urine, CFU are low, but will treat due to symptoms. Rx for keflex sent, pt will be informed by nursing staff that rx was sent

## 2023-04-29 NOTE — Telephone Encounter (Signed)
Called patient regarding her urine culture results. Informed patient that a prescription was sent to her pharmacy which was verified by the patient. Patient notified to take the prescribed Keflex TID for 7 days. Patient voiced understanding. Patient denies any questions at this time.    Marcelino Duster, RN

## 2023-05-02 ENCOUNTER — Telehealth: Payer: Self-pay

## 2023-05-02 DIAGNOSIS — Z758 Other problems related to medical facilities and other health care: Secondary | ICD-10-CM

## 2023-05-02 NOTE — Telephone Encounter (Signed)
Sent referral for primary care.   Felecia Shelling CMA

## 2023-05-30 ENCOUNTER — Other Ambulatory Visit: Payer: Self-pay | Admitting: Family Medicine

## 2023-05-30 DIAGNOSIS — O24415 Gestational diabetes mellitus in pregnancy, controlled by oral hypoglycemic drugs: Secondary | ICD-10-CM

## 2023-06-02 ENCOUNTER — Encounter: Payer: Self-pay | Admitting: Nurse Practitioner

## 2023-06-02 ENCOUNTER — Ambulatory Visit (INDEPENDENT_AMBULATORY_CARE_PROVIDER_SITE_OTHER): Payer: Medicaid Other | Admitting: Nurse Practitioner

## 2023-06-02 VITALS — BP 116/80 | HR 101 | Temp 99.0°F | Ht <= 58 in | Wt 105.0 lb

## 2023-06-02 DIAGNOSIS — Z0000 Encounter for general adult medical examination without abnormal findings: Secondary | ICD-10-CM

## 2023-06-02 DIAGNOSIS — R6889 Other general symptoms and signs: Secondary | ICD-10-CM

## 2023-06-02 DIAGNOSIS — Z131 Encounter for screening for diabetes mellitus: Secondary | ICD-10-CM | POA: Diagnosis not present

## 2023-06-02 DIAGNOSIS — Z8632 Personal history of gestational diabetes: Secondary | ICD-10-CM | POA: Diagnosis not present

## 2023-06-02 LAB — POCT INFLUENZA A/B
Influenza A, POC: NEGATIVE
Influenza B, POC: NEGATIVE

## 2023-06-02 LAB — POCT GLYCOSYLATED HEMOGLOBIN (HGB A1C): Hemoglobin A1C: 5.8 % — AB (ref 4.0–5.6)

## 2023-06-02 LAB — POC COVID19 BINAXNOW: SARS Coronavirus 2 Ag: NEGATIVE

## 2023-06-02 NOTE — Progress Notes (Unsigned)
Subjective   Patient ID: Shelly Koch, female    DOB: 1990/12/05, 32 y.o.   MRN: [id number]  Chief Complaint  Patient presents with   Establish Care    Referring provider: Jac Canavan, PA-C  Shelly Koch is a 32 y.o. female with Past Medical History: No date: Anemia No date: Asherman syndrome No date: Asthma     Comment:  as child 11/23/2018: Chronic bilateral low back pain without sciatica No date: Gestational diabetes     Comment:  first preg No date: Gestational trophoblastic neoplasm No date: Headache No date: Irregular heart beat     Comment:  has a heart monitor No date: Leukopenia due to antineoplastic chemotherapy (HCC) 12/24/2014: PICC (peripherally inserted central catheter) in place No date: Pregnancy induced hypertension No date: Pyelonephritis No date: UTI (urinary tract infection)   HPI  Patient presents today ot establish care.  She states that she has been having cough and sore throat for the past 3 days.  She was seen in urgent care yesterday and was tested for strep which was negative.  She was not given any medication yesterday.  We will test for COVID and flu today.  Patient is running a low-grade fever. Denies f/c/s, n/v/d, hemoptysis, PND, leg swelling. Denies chest pain or edema.  Patient states that she does need to be checked for diabetes.  She recently had a baby 2 months ago and did have gestational diabetes.  A1c in the office today showed Prediabetes: A1C is 5.8.  We discussed diabetic diet.  We will have patient follow-up in 3 months and recheck A1c again.   Allergies  Allergen Reactions   Okra Itching    Hives and itching   Ondansetron Hives and Itching   Phenergan [Promethazine] Hives and Itching    Note: pt tolerates PO COMPAZINE   Zofran [Ondansetron Hcl] Rash    Immunization History  Administered Date(s) Administered   Influenza,inj,Quad PF,6+ Mos 08/26/2013, 03/31/2014, 04/04/2015, 05/01/2016, 05/08/2017, 05/17/2020,  04/09/2021, 09/27/2022   PFIZER(Purple Top)SARS-COV-2 Vaccination 11/24/2019, 12/25/2019   Tdap 03/31/2014, 09/04/2017, 03/17/2023    Tobacco History: Social History   Tobacco Use  Smoking Status Never  Smokeless Tobacco Never   Counseling given: Not Answered   Outpatient Encounter Medications as of 06/02/2023  Medication Sig   acetaminophen (TYLENOL) 500 MG tablet Take 500 mg by mouth every 6 (six) hours as needed.   cephALEXin (KEFLEX) 500 MG capsule Take 1 capsule (500 mg total) by mouth 3 (three) times daily. (Patient not taking: Reported on 06/02/2023)   famotidine (PEPCID) 20 MG tablet TAKE 1 TABLET(20 MG) BY MOUTH AT BEDTIME   ferrous sulfate 325 (65 FE) MG EC tablet Take 325 mg by mouth every other day.   ibuprofen (ADVIL) 600 MG tablet Take 1 tablet (600 mg total) by mouth every 6 (six) hours.   Prenatal Vit w/Fe-Methylfol-FA (PNV-SELECT) 27-0.6-0.4 MG TABS Take 1 tablet by mouth daily.   furosemide (LASIX) 20 MG tablet Take 1 tablet (20 mg total) by mouth daily for 5 doses.   No facility-administered encounter medications on file as of 06/02/2023.    Review of Systems  Review of Systems  Constitutional: Negative.   HENT:  Positive for congestion and sore throat.   Respiratory:  Positive for cough.   Cardiovascular: Negative.   Gastrointestinal: Negative.   Allergic/Immunologic: Negative.   Neurological: Negative.   Psychiatric/Behavioral: Negative.       Objective:   BP 116/80 (BP Location: Left Arm, Patient Position: Sitting,  Cuff Size: Normal)   Pulse (!) 101   Temp 99 F (37.2 C) (Oral)   Ht 4\' 10"  (1.473 m)   Wt 105 lb (47.6 kg)   SpO2 96%   BMI 21.95 kg/m   Wt Readings from Last 5 Encounters:  06/02/23 105 lb (47.6 kg)  04/23/23 105 lb 11.2 oz (47.9 kg)  04/03/23 110 lb (49.9 kg)  03/27/23 109 lb 3.2 oz (49.5 kg)  03/26/23 106 lb 4.8 oz (48.2 kg)     Physical Exam Vitals and nursing note reviewed.  Constitutional:      General: She is  not in acute distress.    Appearance: She is well-developed.  HENT:     Nose: Congestion present.     Mouth/Throat:     Pharynx: Posterior oropharyngeal erythema present.  Cardiovascular:     Rate and Rhythm: Normal rate and regular rhythm.  Pulmonary:     Effort: Pulmonary effort is normal.     Breath sounds: Normal breath sounds.  Neurological:     Mental Status: She is alert and oriented to person, place, and time.     {Labs (Optional):23779}  Assessment & Plan:   History of gestational diabetes mellitus (GDM), not currently pregnant -     POCT glycosylated hemoglobin (Hb A1C) -     CBC -     Comprehensive metabolic panel  Flu-like symptoms -     POCT Influenza A/B -     POC COVID-19 BinaxNow  Diabetes mellitus screening -     CBC -     Comprehensive metabolic panel  Routine adult health maintenance -     CBC -     Comprehensive metabolic panel     Return in about 3 months (around 09/02/2023).   Ivonne Andrew, NP 06/02/2023

## 2023-06-02 NOTE — Patient Instructions (Signed)
1. Flu-like symptoms  - Influenza A/B - POC COVID-19  2. History of gestational diabetes mellitus (GDM), not currently pregnant  - POCT glycosylated hemoglobin (Hb A1C) - CBC - Comprehensive metabolic panel  3. Diabetes mellitus screening  - CBC - Comprehensive metabolic panel  4. Routine adult health maintenance  - CBC - Comprehensive metabolic panel  Follow up:  Follow up in 3 months

## 2023-06-03 LAB — COMPREHENSIVE METABOLIC PANEL
ALT: 25 [IU]/L (ref 0–32)
AST: 21 [IU]/L (ref 0–40)
Albumin: 4.8 g/dL (ref 3.9–4.9)
Alkaline Phosphatase: 63 [IU]/L (ref 44–121)
BUN/Creatinine Ratio: 11 (ref 9–23)
BUN: 6 mg/dL (ref 6–20)
Bilirubin Total: 0.9 mg/dL (ref 0.0–1.2)
CO2: 25 mmol/L (ref 20–29)
Calcium: 10 mg/dL (ref 8.7–10.2)
Chloride: 102 mmol/L (ref 96–106)
Creatinine, Ser: 0.57 mg/dL (ref 0.57–1.00)
Globulin, Total: 2.8 g/dL (ref 1.5–4.5)
Glucose: 106 mg/dL — ABNORMAL HIGH (ref 70–99)
Potassium: 4 mmol/L (ref 3.5–5.2)
Sodium: 142 mmol/L (ref 134–144)
Total Protein: 7.6 g/dL (ref 6.0–8.5)
eGFR: 123 mL/min/{1.73_m2} (ref >59)

## 2023-06-03 LAB — CBC
Hematocrit: 37.5 % (ref 34.0–46.6)
Hemoglobin: 12.1 g/dL (ref 11.1–15.9)
MCH: 31.4 pg (ref 26.6–33.0)
MCHC: 32.3 g/dL (ref 31.5–35.7)
MCV: 97 fL (ref 79–97)
Platelets: 186 10*3/uL (ref 150–450)
RBC: 3.85 x10E6/uL (ref 3.77–5.28)
RDW: 12.7 % (ref 11.7–15.4)
WBC: 7.7 10*3/uL (ref 3.4–10.8)

## 2023-07-29 ENCOUNTER — Other Ambulatory Visit: Payer: Self-pay

## 2023-07-29 ENCOUNTER — Emergency Department (HOSPITAL_COMMUNITY)
Admission: EM | Admit: 2023-07-29 | Discharge: 2023-07-29 | Disposition: A | Discharge status: Home / Self Care | Payer: Medicaid Other | Attending: Emergency Medicine | Admitting: Emergency Medicine

## 2023-07-29 ENCOUNTER — Emergency Department (HOSPITAL_COMMUNITY): Payer: Medicaid Other

## 2023-07-29 DIAGNOSIS — R112 Nausea with vomiting, unspecified: Secondary | ICD-10-CM | POA: Diagnosis present

## 2023-07-29 DIAGNOSIS — R197 Diarrhea, unspecified: Secondary | ICD-10-CM | POA: Insufficient documentation

## 2023-07-29 DIAGNOSIS — R109 Unspecified abdominal pain: Secondary | ICD-10-CM | POA: Diagnosis not present

## 2023-07-29 LAB — URINALYSIS, ROUTINE W REFLEX MICROSCOPIC
Bilirubin Urine: NEGATIVE
Glucose, UA: NEGATIVE mg/dL
Hgb urine dipstick: NEGATIVE
Ketones, ur: NEGATIVE mg/dL
Leukocytes,Ua: NEGATIVE
Nitrite: NEGATIVE
Protein, ur: NEGATIVE mg/dL
Specific Gravity, Urine: 1.004 — ABNORMAL LOW (ref 1.005–1.030)
pH: 6 (ref 5.0–8.0)

## 2023-07-29 LAB — CBC
HCT: 37.5 % (ref 36.0–46.0)
Hemoglobin: 12.5 g/dL (ref 12.0–15.0)
MCH: 31.9 pg (ref 26.0–34.0)
MCHC: 33.3 g/dL (ref 30.0–36.0)
MCV: 95.7 fL (ref 80.0–100.0)
Platelets: 161 10*3/uL (ref 150–400)
RBC: 3.92 MIL/uL (ref 3.87–5.11)
RDW: 12.3 % (ref 11.5–15.5)
WBC: 9.2 10*3/uL (ref 4.0–10.5)
nRBC: 0 % (ref 0.0–0.2)

## 2023-07-29 LAB — COMPREHENSIVE METABOLIC PANEL
ALT: 19 U/L (ref 0–44)
AST: 19 U/L (ref 15–41)
Albumin: 4.2 g/dL (ref 3.5–5.0)
Alkaline Phosphatase: 46 U/L (ref 38–126)
Anion gap: 8 (ref 5–15)
BUN: 9 mg/dL (ref 6–20)
CO2: 24 mmol/L (ref 22–32)
Calcium: 9.1 mg/dL (ref 8.9–10.3)
Chloride: 105 mmol/L (ref 98–111)
Creatinine, Ser: 0.54 mg/dL (ref 0.44–1.00)
GFR, Estimated: >60 mL/min (ref >60)
Glucose, Bld: 110 mg/dL — ABNORMAL HIGH (ref 70–99)
Potassium: 3.8 mmol/L (ref 3.5–5.1)
Sodium: 137 mmol/L (ref 135–145)
Total Bilirubin: 1.3 mg/dL — ABNORMAL HIGH (ref 0.0–1.2)
Total Protein: 7.4 g/dL (ref 6.5–8.1)

## 2023-07-29 LAB — HCG, SERUM, QUALITATIVE: Preg, Serum: NEGATIVE

## 2023-07-29 LAB — LIPASE, BLOOD: Lipase: 34 U/L (ref 11–51)

## 2023-07-29 MED ORDER — METOCLOPRAMIDE HCL 5 MG/ML IJ SOLN
10.0000 mg | Freq: Once | INTRAMUSCULAR | Status: AC
  Administered 2023-07-29: 10 mg via INTRAVENOUS
  Filled 2023-07-29: qty 2

## 2023-07-29 MED ORDER — METOCLOPRAMIDE HCL 10 MG PO TABS: 10.0000 mg | ORAL_TABLET | Freq: Four times a day (QID) | ORAL | 0 refills | Status: AC | PRN

## 2023-07-29 MED ORDER — FOSFOMYCIN TROMETHAMINE 3 G PO PACK
3.0000 g | PACK | Freq: Once | ORAL | Status: AC
  Administered 2023-07-29: 3 g via ORAL
  Filled 2023-07-29: qty 3

## 2023-07-29 MED ORDER — IOHEXOL 350 MG/ML SOLN
75.0000 mL | Freq: Once | INTRAVENOUS | Status: AC | PRN
  Administered 2023-07-29: 75 mL via INTRAVENOUS

## 2023-07-29 MED ORDER — DIPHENHYDRAMINE HCL 50 MG/ML IJ SOLN
25.0000 mg | Freq: Once | INTRAMUSCULAR | Status: AC
  Administered 2023-07-29: 25 mg via INTRAVENOUS
  Filled 2023-07-29: qty 1

## 2023-07-29 MED ORDER — SODIUM CHLORIDE 0.9 % IV BOLUS
1000.0000 mL | Freq: Once | INTRAVENOUS | Status: AC
  Administered 2023-07-29: 1000 mL via INTRAVENOUS

## 2023-07-29 NOTE — ED Provider Notes (Signed)
 [place] [facility] Provider Note   CSN: [id number] Arrival date & time: 07/29/23  1242     History  Chief Complaint  Patient presents with   Abdominal Pain    Dsyuria and flank pain    Shelly Koch is a 33 y.o. female who presents emergency department with chief complaint of nausea vomiting diarrhea and abdominal pain.  She has a past medical history of stational diabetes with severe preeclampsia and underwent induced delivery in August 2024.  Patient also suffered retained placenta and hemorrhage greater than 1 L requiring curettage and JADA placement postpartum with blood transfusion.  There was also some concern for endometritis at that time and she was placed on oral antibiotics.  Patient reports that since that time she has not had pelvic pain and burning with urination.  Patient reports that she developed sudden onset of nausea vomiting and diarrhea today and has been unable to hold any food or fluid down.  She also complains of progressively worsening pain in her lower abdomen since this morning.  She is concerned that she could have had a smoldering infection since her delivery back in August.  She has known allergy to Zofran  and Phenergan .  She feels sick significantly nauseous.  She denies fever.   Abdominal Pain      Home Medications Prior to Admission medications   Medication Sig Start Date End Date Taking? Authorizing Provider  cephALEXin  (KEFLEX ) 500 MG capsule Take 1 capsule (500 mg total) by mouth 3 (three) times daily. Patient not taking: Reported on 06/02/2023 04/29/23   Zina Jerilynn LABOR, MD  metoCLOPramide  (REGLAN ) 10 MG tablet Take 1 tablet (10 mg total) by mouth every 6 (six) hours as needed for nausea (nausea/headache). 07/29/23  Yes Daily Crate, PA-C  acetaminophen  (TYLENOL ) 500 MG tablet Take 500 mg by mouth every 6 (six) hours as needed.    [provider]  famotidine  (PEPCID ) 20 MG tablet TAKE 1 TABLET(20 MG) BY  MOUTH AT BEDTIME 02/25/23   Delores Nidia CROME, FNP  ferrous sulfate  325 (65 FE) MG EC tablet Take 325 mg by mouth every other day. 11/21/22   [provider]  furosemide  (LASIX ) 20 MG tablet Take 1 tablet (20 mg total) by mouth daily for 5 doses. 03/23/23 03/28/23  Erik Kieth BROCKS, MD  ibuprofen  (ADVIL ) 600 MG tablet Take 1 tablet (600 mg total) by mouth every 6 (six) hours. 03/19/23   Zina Jerilynn LABOR, MD  Prenatal Vit w/Fe-Methylfol-FA (PNV-SELECT ) 27-0.6-0.4 MG TABS Take 1 tablet by mouth daily. 09/27/22   Smith, [place] , CNM      Allergies    Okra, Ondansetron , Phenergan  [promethazine ], and Zofran  [ondansetron  hcl]    Review of Systems   Review of Systems  Gastrointestinal:  Positive for abdominal pain.    Physical Exam Updated Vital Signs BP 110/77 (BP Location: Left Arm)   Pulse (!) 107   Temp 99.8 F (37.7 C) (Oral)   Resp 16   Ht 4' 10 (1.473 m)   Wt 47.6 kg   LMP  (LMP Unknown)   SpO2 96%   Breastfeeding No   BMI 21.95 kg/m  Physical Exam Vitals and nursing note reviewed.  Constitutional:      General: She is not in acute distress.    Appearance: She is well-developed. She is ill-appearing. She is not diaphoretic.  HENT:     Head: Normocephalic and atraumatic.     Right Ear: External ear normal.  Left Ear: External ear normal.     Nose: Nose normal.     Mouth/Throat:     Mouth: Mucous membranes are dry.  Eyes:     General: No scleral icterus.    Conjunctiva/sclera: Conjunctivae normal.  Cardiovascular:     Rate and Rhythm: Normal rate and regular rhythm.     Heart sounds: Normal heart sounds. No murmur heard.    No friction rub. No gallop.  Pulmonary:     Effort: Pulmonary effort is normal. No respiratory distress.     Breath sounds: Normal breath sounds.  Abdominal:     General: Bowel sounds are normal. There is no distension.     Palpations: Abdomen is soft. There is no mass.     Tenderness: There is abdominal tenderness. There is no guarding.   Musculoskeletal:     Cervical back: Normal range of motion.  Skin:    General: Skin is warm and dry.  Neurological:     Mental Status: She is alert and oriented to person, place, and time.  Psychiatric:        Behavior: Behavior normal.     ED Results / Procedures / Treatments   Labs (all labs ordered are listed, but only abnormal results are displayed) Labs Reviewed  COMPREHENSIVE METABOLIC PANEL - Abnormal; Notable for the following components:      Result Value   Glucose, Bld 110 (*)    Total Bilirubin 1.3 (*)    All other components within normal limits  URINALYSIS, ROUTINE W REFLEX MICROSCOPIC - Abnormal; Notable for the following components:   Color, Urine COLORLESS (*)    Specific Gravity, Urine 1.004 (*)    All other components within normal limits  URINE CULTURE  LIPASE, BLOOD  CBC  HCG, SERUM, QUALITATIVE    EKG None  Radiology CT ABDOMEN PELVIS W CONTRAST Result Date: 07/29/2023 CLINICAL DATA:  Abdominal pain, flank pain EXAM: CT ABDOMEN AND PELVIS WITH CONTRAST TECHNIQUE: Multidetector CT imaging of the abdomen and pelvis was performed using the standard protocol following bolus administration of intravenous contrast. RADIATION DOSE REDUCTION: This exam was performed according to the departmental dose-optimization program which includes automated exposure control, adjustment of the mA and/or kV according to patient size and/or use of iterative reconstruction technique. CONTRAST:  75mL OMNIPAQUE  IOHEXOL  350 MG/ML SOLN COMPARISON:  04/02/2021 FINDINGS: Lower chest: No acute findings Hepatobiliary: No focal hepatic abnormality. Gallbladder unremarkable. Pancreas: No focal abnormality or ductal dilatation. Spleen: No focal abnormality.  Normal size. Adrenals/Urinary Tract: No adrenal abnormality. No focal renal abnormality. No stones or hydronephrosis. Urinary bladder is unremarkable. Stomach/Bowel: Normal appendix. Stomach, large and small bowel grossly unremarkable.  Vascular/Lymphatic: No evidence of aneurysm or adenopathy. Reproductive: Uterus and adnexa unremarkable.  No mass. Other: No free fluid or free air. Musculoskeletal: No acute bony abnormality. IMPRESSION: No acute findings in the abdomen or pelvis. Electronically Signed   By: Franky Crease M.D.   On: 07/29/2023 20:01    Procedures Procedures    Medications Ordered in ED Medications  sodium chloride  0.9 % bolus 1,000 mL (0 mLs Intravenous Stopped 07/29/23 2210)  metoCLOPramide  (REGLAN ) injection 10 mg (10 mg Intravenous Given 07/29/23 1833)  diphenhydrAMINE  (BENADRYL ) injection 25 mg (25 mg Intravenous Given 07/29/23 1830)  iohexol  (OMNIPAQUE ) 350 MG/ML injection 75 mL (75 mLs Intravenous Contrast Given 07/29/23 1946)  fosfomycin (MONUROL ) packet 3 g (3 g Oral Given 07/29/23 2210)    ED Course/ Medical Decision Making/ A&P  Medical Decision Making This patient presents to the ED for concern of n/v/d, this involves an extensive number of treatment options, and is a complaint that carries with it a high risk of complications and morbidity.  The differential diagnosis for generalized abdominal pain includes, but is not limited to AAA, gastroenteritis, appendicitis, Bowel obstruction, Bowel perforation. Gastroparesis, DKA, Hernia, Inflammatory bowel disease, mesenteric ischemia, pancreatitis, peritonitis SBP, volvulus, Gastorenteritis.  Co morbidities:      as per hpi  Social Determinants of Health:       SDOH Screenings Food Insecurity: No Food Insecurity (03/15/2023) Housing: High Risk (03/15/2023) Transportation Needs: No Transportation Needs (03/15/2023) Utilities: Not At Risk (03/15/2023) Depression (PHQ2-9): Low Risk  (10/28/2022) Tobacco Use: Low Risk  (06/23/2023)     Received from [facility]   Additional history:  {Additional history obtained from triage {External records from outside source obtained and reviewed including previous admission  records  Lab Tests:  I Ordered, and personally interpreted labs.  The pertinent results include:    No acute findings, mildly elevated total bili just above normal. Imaging Studies:  I ordered imaging studies including CT abdomen pelvis I independently visualized and interpreted imaging which showed acute finding I agree with the radiologist interpretation    Medicines ordered and prescription drug management:  I ordered medication including Medications sodium chloride  0.9 % bolus 1,000 mL (0 mLs Intravenous Stopped 07/29/23 2210)  metoCLOPramide  (REGLAN ) injection 10 mg (10 mg Intravenous Given 07/29/23 1833) diphenhydrAMINE  (BENADRYL ) injection 25 mg (25 mg Intravenous Given 07/29/23 1830) iohexol  (OMNIPAQUE ) 350 MG/ML injection 75 mL (75 mLs Intravenous Contrast Given 07/29/23 1946) fosfomycin (MONUROL ) packet 3 g (3 g Oral Given 07/29/23 2210) for  dysuria and vomiting Reevaluation of the patient after these medicines showed that the patient improved I have reviewed the patients home medicines and have made adjustments as needed  Test Considered:         Critical Interventions:         Consultations Obtained:   Problem List / ED Course:       (R11.2,  R19.7) Nausea vomiting and diarrhea  (primary encounter diagnosis)   MDM: Patient here with nausea vomiting and diarrhea.  Symptoms resolved.  Repeat abdominal exam reveals benign abdomen.  Urine culture sent.  Patient treated in the ED with fosfomycin.  Discharged with Reglan    Dispostion:  After consideration of the diagnostic results and the patients response to treatment, I feel that the patent would benefit from discharge.    Amount and/or Complexity of Data Reviewed Labs: ordered. Radiology: ordered.  Risk Prescription drug management.           Final Clinical Impression(s) / ED Diagnoses Final diagnoses:  Nausea vomiting and diarrhea    Rx / DC Orders ED Discharge Orders          Ordered     metoCLOPramide  (REGLAN ) 10 MG tablet  Every 6 hours PRN        07/29/23 2202              Arloa Chroman, PA-C 07/29/23 2312    Jerrol Agent, MD 07/30/23 1341

## 2023-07-29 NOTE — ED Triage Notes (Signed)
 Pt.had  bilateral lower abdominal pain flank pain and dysuria yesterday and this am woke up with n/v.  Denies any diarrhea  Denies any vaginal discharge or pain.

## 2023-07-29 NOTE — Discharge Instructions (Addendum)
 You may use Imodium which is available over-the-counter for diarrhea if you continue to have some. Get help right away if you: Have chest pain. Have trouble breathing or you are breathing very quickly. Have a fast heartbeat. Feel extremely weak or you faint. Have a severe headache, a stiff neck, or both. Have a rash. Have severe pain, cramping, or bloating in your abdomen. Have skin that feels cold and clammy. Feel confused. Have pain when you urinate. Have signs of dehydration, such as: Dark urine, very little urine, or no urine. Cracked lips. Dry mouth. Sunken eyes. Sleepiness. Weakness. Have signs of bleeding, such as: Seeing blood in your vomit. Having vomit that looks like coffee grounds. Having bloody or black stools or stools that look like tar. These symptoms may be an emergency. Get help right away. Call 911. Do not wait to see if the symptoms will go away. Do not drive yourself to the hospital.

## 2023-07-29 NOTE — ED Notes (Signed)
 Went to reevaluate pt. For pain and update on labs.  Pt. Being transferred to a hallway bed.

## 2023-07-30 LAB — URINE CULTURE: Culture: NO GROWTH

## 2023-09-03 ENCOUNTER — Encounter: Payer: Self-pay | Admitting: Nurse Practitioner

## 2023-09-03 ENCOUNTER — Ambulatory Visit (INDEPENDENT_AMBULATORY_CARE_PROVIDER_SITE_OTHER): Payer: Medicaid Other | Admitting: Nurse Practitioner

## 2023-09-03 VITALS — BP 108/87 | HR 90 | Temp 97.9°F | Wt 107.0 lb

## 2023-09-03 DIAGNOSIS — Z131 Encounter for screening for diabetes mellitus: Secondary | ICD-10-CM

## 2023-09-03 LAB — POCT GLYCOSYLATED HEMOGLOBIN (HGB A1C): Hemoglobin A1C: 5.6 % (ref 4.0–5.6)

## 2023-09-03 NOTE — Patient Instructions (Signed)
1. Diabetes mellitus screening (Primary)  - POCT glycosylated hemoglobin (Hb A1C)   Follow up:  Follow up in 6 months

## 2023-09-03 NOTE — Progress Notes (Signed)
Subjective   Patient ID: Shelly Koch, female    DOB: 07/17/1991, 33 y.o.   MRN: [id number]  Chief Complaint  Patient presents with   Establish Care    Referring provider: Jac Canavan, PA-C  Skyelyn Scruggs is a 33 y.o. 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 Patient states that she does need to be rechecked for diabetes.  She recently had a baby 2 months ago and did have gestational diabetes.  A1c in the office today showed Prediabetes: A1C is 5.6.  We discussed diabetic diet.  We will have patient follow-up in 6 months and recheck A1c again.    Allergies  Allergen Reactions   Okra Itching    Hives and itching   Ondansetron Hives and Itching   Phenergan [Promethazine] Hives and Itching    Note: pt tolerates PO COMPAZINE   Zofran [Ondansetron Hcl] Rash    Immunization History  Administered Date(s) Administered   Influenza,inj,Quad PF,6+ Mos 08/26/2013, 03/31/2014, 04/04/2015, 05/01/2016, 05/08/2017, 05/17/2020, 04/09/2021, 09/27/2022   Influenza-Unspecified 12/31/2022, 12/31/2022   PFIZER(Purple Top)SARS-COV-2 Vaccination 11/24/2019, 12/25/2019   Tdap 03/31/2014, 09/04/2017, 03/17/2023    Tobacco History: Social History   Tobacco Use  Smoking Status Never  Smokeless Tobacco Never   Counseling given: Not Answered   Outpatient Encounter Medications as of  09/03/2023  Medication Sig   cephALEXin (KEFLEX) 500 MG capsule Take 1 capsule (500 mg total) by mouth 3 (three) times daily. (Patient not taking: Reported on 09/03/2023)   acetaminophen (TYLENOL) 500 MG tablet Take 500 mg by mouth every 6 (six) hours as needed. (Patient not taking: Reported on 09/03/2023)   famotidine (PEPCID) 20 MG tablet TAKE 1 TABLET(20 MG) BY MOUTH AT BEDTIME (Patient not taking: Reported on 09/03/2023)   ferrous sulfate 325 (65 FE) MG EC tablet Take 325 mg by mouth every other day. (Patient not taking: Reported on 09/03/2023)   furosemide (LASIX) 20 MG tablet Take 1 tablet (20 mg total) by mouth daily for 5 doses.   ibuprofen (ADVIL) 600 MG tablet Take 1 tablet (600 mg total) by mouth every 6 (six) hours. (Patient not taking: Reported on 09/03/2023)   metoCLOPramide (REGLAN) 10 MG tablet Take 1 tablet (10 mg total) by mouth every 6 (six) hours as needed for nausea (nausea/headache). (Patient not taking: Reported on 09/03/2023)   Prenatal Vit w/Fe-Methylfol-FA (PNV-SELECT) 27-0.6-0.4 MG TABS Take 1 tablet by mouth daily. (Patient not taking: Reported on 09/03/2023)   No facility-administered encounter medications on file as of 09/03/2023.    Review of Systems  Review of Systems  Constitutional: Negative.   HENT: Negative.    Cardiovascular: Negative.   Gastrointestinal: Negative.   Allergic/Immunologic: Negative.   Neurological: Negative.   Psychiatric/Behavioral: Negative.       Objective:   BP 108/87   Pulse 90   Temp 97.9 F (36.6 C)   Wt 107 lb (48.5 kg)   SpO2 98%  BMI 22.36 kg/m   Wt Readings from Last 5 Encounters:  09/03/23 107 lb (48.5 kg)  07/29/23 105 lb (47.6 kg)  06/02/23 105 lb (47.6 kg)  04/23/23 105 lb 11.2 oz (47.9 kg)  04/03/23 110 lb (49.9 kg)     Physical Exam Vitals and nursing note reviewed.  Constitutional:      General: She is not in acute distress.    Appearance: She is well-developed.  Cardiovascular:     Rate and Rhythm:  Normal rate and regular rhythm.  Pulmonary:     Effort: Pulmonary effort is normal.     Breath sounds: Normal breath sounds.  Neurological:     Mental Status: She is alert and oriented to person, place, and time.       Assessment & Plan:   Diabetes mellitus screening -     POCT glycosylated hemoglobin (Hb A1C)     Return in about 6 months (around 03/02/2024) for Physical.   Ivonne Andrew, NP 09/03/2023

## 2024-01-28 ENCOUNTER — Ambulatory Visit: Admitting: Family Medicine

## 2024-01-28 ENCOUNTER — Encounter: Payer: Self-pay | Admitting: Family Medicine

## 2024-01-28 ENCOUNTER — Other Ambulatory Visit: Payer: Self-pay

## 2024-01-28 VITALS — BP 122/80 | HR 91 | Wt 109.5 lb

## 2024-01-28 DIAGNOSIS — Z30430 Encounter for insertion of intrauterine contraceptive device: Secondary | ICD-10-CM

## 2024-01-28 DIAGNOSIS — Z3009 Encounter for other general counseling and advice on contraception: Secondary | ICD-10-CM

## 2024-01-28 DIAGNOSIS — Z3046 Encounter for surveillance of implantable subdermal contraceptive: Secondary | ICD-10-CM

## 2024-01-28 DIAGNOSIS — Z975 Presence of (intrauterine) contraceptive device: Secondary | ICD-10-CM

## 2024-01-28 MED ORDER — LEVONORGESTREL 20 MCG/DAY IU IUD
1.0000 | INTRAUTERINE_SYSTEM | Freq: Once | INTRAUTERINE | Status: AC
  Administered 2024-01-28: 1 via INTRAUTERINE

## 2024-01-28 NOTE — Progress Notes (Signed)
 Contraception/Family Planning VISIT ENCOUNTER NOTE  Subjective:   Shelly Koch  is a 33 y.o. [id number] female here for reproductive life counseling.  Has been having everyday bleeding for about 1 year. Nexplanon  placed in August 2024 postpartum    Reports she does not want a pregnancy in the next year.  They are sexually active.  Current method of contraception: Nexplanon   Denies abnormal vaginal bleeding, discharge, pelvic pain, problems with intercourse or other gynecologic concerns.    Previous methods used/with SE: - Depo- does not like this-- feels this caused her to need a DE - Nexplanon -- had one prior to current device. With this device   Using tobacco products? No History of Migraines? No History of DVT/PE? No Other contraindication for estrogen?  No   Gynecologic History No LMP recorded. Patient has had an implant. Contraception: Nexplanon   Health Maintenance Due  Topic Date Due   Hepatitis B Vaccines (1 of 3 - 19+ 3-dose series) Never done   HPV VACCINES (1 - Risk 3-dose SCDM series) Never done   COVID-19 Vaccine (3 - 2024-25 season) 03/23/2023     The following portions of the patient's history were reviewed and updated as appropriate: allergies, current medications, past family history, past medical history, past social history, past surgical history and problem list.  Review of Systems Pertinent items are noted in HPI.   Objective:  BP 122/80   Pulse 91   Wt 109 lb 8 oz (49.7 kg)   BMI 22.89 kg/m  Gen: well appearing, NAD HEENT: no scleral icterus CV: RR Lung: Normal WOB Ext: warm well perfused  PELVIC: Normal appearing external genitalia; normal appearing vaginal mucosa and cervix.  No abnormal discharge noted.  Normal uterine size, no other palpable masses, no uterine or adnexal tenderness.    Nexplanon  Removal Patient identified, informed consent performed, consent signed.   Appropriate time out taken. Nexplanon  site identified.  Area prepped in  usual sterile fashon. One ml of 1% lidocaine  was used to anesthetize the area at the distal end of the implant. A small stab incision was made right beside the implant on the distal portion.  The Nexplanon  rod was grasped using hemostats and removed without difficulty.  There was minimal blood loss. There were no complications.  3 ml of 1% lidocaine  was injected around the incision for post-procedure analgesia.  Steri-strips were applied over the small incision.  A pressure bandage was applied to reduce any bruising.  The patient tolerated the procedure well and was given post procedure instructions.  Patient is planning to use IUD for contraception/attempt conception.   IUD Insertion Procedure Note Patient identified, informed consent performed, consent signed.   Discussed risks of irregular bleeding, cramping, infection, malpositioning or misplacement of the IUD outside the uterus which may require further procedure such as laparoscopy. Time out was performed.  Urine pregnancy test negative.  Speculum placed in the vagina.  Cervix visualized.  Cleaned with Betadine x 2.  Grasped anteriorly with a single tooth tenaculum.  Uterus sounded to 8 cm.  IUD placed per manufacturer's recommendations.  Strings trimmed to 3 cm. Tenaculum was removed, good hemostasis noted.  Patient tolerated procedure well.      Assessment and Plan:   1. IUD contraception - levonorgestrel  (MIRENA ) 20 MCG/DAY IUD 1 each Patient was given post-procedure instructions.  She was advised to have backup contraception for one week.  Patient was also asked to check IUD strings periodically and follow up in 4 weeks for IUD check.  2. Nexplanon  removal  3. Encounter for other general counseling or advice on contraception (Primary) Contraception counseling: Reviewed all forms of birth control options available including abstinence; over the counter/barrier methods; hormonal contraceptive medication including pill, patch, ring,  injection,contraceptive implant; hormonal and nonhormonal IUDs; permanent sterilization options including vasectomy and the various tubal sterilization modalities. Risks and benefits reviewed.  Questions were answered.  Written information was also given to the patient to review.  Patient desires IUD, this was prescribed for patient. She will follow up in  4-6 weeks for surveillance.  She was told to call with any further questions, or with any concerns about this method of contraception.  Emphasized use of condoms 100% of the time for STI prevention.  Please refer to After Visit Summary for other counseling recommendations.   Return in about 4 weeks (around 02/25/2024) for IUD string check PRN if cannot feel strings.  Future Appointments  Date Time Provider Department Center  03/03/2024  1:40 PM Oley Bascom RAMAN, NP [facility] None    Suzen Maryan Masters, MD, MPH, ABFM Attending Physician [facility]

## 2024-01-30 LAB — LAB REPORT - SCANNED

## 2024-03-02 ENCOUNTER — Ambulatory Visit (INDEPENDENT_AMBULATORY_CARE_PROVIDER_SITE_OTHER): Payer: Self-pay | Admitting: Nurse Practitioner

## 2024-03-02 ENCOUNTER — Encounter: Payer: Self-pay | Admitting: Nurse Practitioner

## 2024-03-02 VITALS — BP 108/75 | HR 83 | Temp 97.9°F | Wt 110.0 lb

## 2024-03-02 DIAGNOSIS — Z131 Encounter for screening for diabetes mellitus: Secondary | ICD-10-CM | POA: Diagnosis not present

## 2024-03-02 LAB — POCT GLYCOSYLATED HEMOGLOBIN (HGB A1C): Hemoglobin A1C: 5.9 % — AB (ref 4.0–5.6)

## 2024-03-02 NOTE — Progress Notes (Signed)
 Subjective   Patient ID: Shelly Koch, female    DOB: Jul 04, 1991, 33 y.o.   MRN: [id number]  Chief Complaint  Patient presents with   Follow-up    Referring provider: Bulah Alm RAMAN, PA-C  Shelly Koch is a 33 y.o. 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 Patient states that she does need to be rechecked for diabetes.  She recently had a baby 11 months ago and did have gestational diabetes.  A1c in the office today showed Prediabetes: A1C is 5.9.  We discussed diabetic diet.  We will have patient follow-up in 6 months and recheck A1c again. Denies f/c/s, n/v/d, hemoptysis, PND, leg swelling. Denies chest pain or edema.    Allergies  Allergen Reactions   Okra Itching    Hives and itching   Ondansetron  Hives and Itching   Phenergan  [Promethazine ] Hives and Itching    Note: pt tolerates PO COMPAZINE    Zofran  [Ondansetron  Hcl] Rash    Immunization History  Administered Date(s) Administered   Influenza,inj,Quad PF,6+ Mos 08/26/2013, 03/31/2014, 04/04/2015, 05/01/2016, 05/08/2017, 05/17/2020, 04/09/2021, 09/27/2022   Influenza-Unspecified 12/31/2022, 12/31/2022   PFIZER(Purple Top)SARS-COV-2 Vaccination 11/24/2019, 12/25/2019   Tdap 03/31/2014, 09/04/2017, 03/17/2023    Tobacco History: Social History   Tobacco Use  Smoking Status Never  Smokeless Tobacco Never    Counseling given: Not Answered   Outpatient Encounter Medications as of 03/02/2024  Medication Sig   cephALEXin  (KEFLEX ) 500 MG capsule Take 1 capsule (500 mg total) by mouth 3 (three) times daily. (Patient not taking: Reported on 09/03/2023)   acetaminophen  (TYLENOL ) 500 MG tablet Take 500 mg by mouth every 6 (six) hours as needed. (Patient not taking: Reported on 09/03/2023)   famotidine  (PEPCID ) 20 MG tablet TAKE 1 TABLET(20 MG) BY MOUTH AT BEDTIME (Patient not taking: Reported on 09/03/2023)   ferrous sulfate  325 (65 FE) MG EC tablet Take 325 mg by mouth every other day. (Patient not taking: Reported on 09/03/2023)   furosemide  (LASIX ) 20 MG tablet Take 1 tablet (20 mg total) by mouth daily for 5 doses.   ibuprofen  (ADVIL ) 600 MG tablet Take 1 tablet (600 mg total) by mouth every 6 (six) hours. (Patient not taking: Reported on 09/03/2023)   metoCLOPramide  (REGLAN ) 10 MG tablet Take 1 tablet (10 mg total) by mouth every 6 (six) hours as needed for nausea (nausea/headache). (Patient not taking: Reported on 09/03/2023)   Prenatal Vit w/Fe-Methylfol-FA (PNV-SELECT ) 27-0.6-0.4 MG TABS Take 1 tablet by mouth daily. (Patient not taking: Reported on 09/03/2023)   No facility-administered encounter medications on file as of 03/02/2024.    Review of Systems  Review of Systems  Constitutional: Negative.   HENT: Negative.    Cardiovascular: Negative.   Gastrointestinal: Negative.   Allergic/Immunologic: Negative.   Neurological: Negative.   Psychiatric/Behavioral: Negative.       Objective:   BP 108/75   Pulse 83   Temp 97.9 F (36.6  C) (Oral)   Wt 110 lb (49.9 kg)   SpO2 99%   BMI 22.99 kg/m   Wt Readings from Last 5 Encounters:  03/02/24 110 lb (49.9 kg)  01/28/24 109 lb 8 oz (49.7 kg)  09/03/23 107 lb (48.5 kg)  07/29/23 105 lb (47.6 kg)  06/02/23 105 lb (47.6 kg)     Physical Exam Vitals and nursing note reviewed.  Constitutional:      General: She is not in acute  distress.    Appearance: She is well-developed.  Cardiovascular:     Rate and Rhythm: Normal rate and regular rhythm.  Pulmonary:     Effort: Pulmonary effort is normal.     Breath sounds: Normal breath sounds.  Neurological:     Mental Status: She is alert and oriented to person, place, and time.       Assessment & Plan:   Diabetes mellitus screening -     POCT glycosylated hemoglobin (Hb A1C)     Return in about 6 months (around 09/02/2024).   Bascom GORMAN Borer, NP 03/02/2024

## 2024-03-03 ENCOUNTER — Encounter: Payer: Self-pay | Admitting: Nurse Practitioner

## 2024-09-02 ENCOUNTER — Ambulatory Visit: Payer: Self-pay | Admitting: Nurse Practitioner
